# Patient Record
Sex: Female | Born: 1935 | Race: White | Hispanic: No | Smoking: Never smoker
Health system: Southern US, Community
[De-identification: ages and names within clinical notes are randomized; demographics above are authoritative.]

## PROBLEM LIST (undated history)

## (undated) DIAGNOSIS — R634 Abnormal weight loss: Secondary | ICD-10-CM

## (undated) DIAGNOSIS — E119 Type 2 diabetes mellitus without complications: Secondary | ICD-10-CM

## (undated) DIAGNOSIS — E785 Hyperlipidemia, unspecified: Secondary | ICD-10-CM

## (undated) DIAGNOSIS — N200 Calculus of kidney: Secondary | ICD-10-CM

## (undated) DIAGNOSIS — G4700 Insomnia, unspecified: Secondary | ICD-10-CM

## (undated) DIAGNOSIS — F419 Anxiety disorder, unspecified: Secondary | ICD-10-CM

## (undated) DIAGNOSIS — I10 Essential (primary) hypertension: Secondary | ICD-10-CM

## (undated) DIAGNOSIS — Z862 Personal history of diseases of the blood and blood-forming organs and certain disorders involving the immune mechanism: Secondary | ICD-10-CM

## (undated) DIAGNOSIS — K219 Gastro-esophageal reflux disease without esophagitis: Secondary | ICD-10-CM

## (undated) DIAGNOSIS — F329 Major depressive disorder, single episode, unspecified: Secondary | ICD-10-CM

## (undated) HISTORY — PX: DILATION AND CURETTAGE OF UTERUS: SHX78

## (undated) HISTORY — PX: HEMORRHOID SURGERY: SHX153

## (undated) HISTORY — DX: Major depressive disorder, single episode, unspecified: F32.9

## (undated) HISTORY — DX: Insomnia, unspecified: G47.00

## (undated) HISTORY — DX: Abnormal weight loss: R63.4

## (undated) HISTORY — PX: ABDOMINAL HYSTERECTOMY: SHX81

## (undated) HISTORY — DX: Hyperlipidemia, unspecified: E78.5

## (undated) HISTORY — DX: Essential (primary) hypertension: I10

## (undated) HISTORY — PX: APPENDECTOMY: SHX54

## (undated) HISTORY — DX: Gastro-esophageal reflux disease without esophagitis: K21.9

## (undated) HISTORY — PX: CATARACT EXTRACTION: SUR2

## (undated) HISTORY — DX: Type 2 diabetes mellitus without complications: E11.9

## (undated) HISTORY — DX: Calculus of kidney: N20.0

## (undated) HISTORY — DX: Personal history of diseases of the blood and blood-forming organs and certain disorders involving the immune mechanism: Z86.2

---

## 1999-08-13 ENCOUNTER — Encounter: Admission: RE | Admit: 1999-08-13 | Discharge: 1999-08-13 | Payer: Self-pay | Admitting: Internal Medicine

## 1999-08-13 ENCOUNTER — Encounter: Payer: Self-pay | Admitting: Internal Medicine

## 1999-08-21 ENCOUNTER — Encounter: Admission: RE | Admit: 1999-08-21 | Discharge: 1999-08-21 | Payer: Self-pay | Admitting: Internal Medicine

## 1999-08-21 ENCOUNTER — Encounter: Payer: Self-pay | Admitting: Internal Medicine

## 2004-08-14 ENCOUNTER — Ambulatory Visit: Payer: Self-pay | Admitting: Internal Medicine

## 2004-11-20 ENCOUNTER — Ambulatory Visit: Payer: Self-pay | Admitting: Internal Medicine

## 2005-02-19 ENCOUNTER — Ambulatory Visit: Payer: Self-pay | Admitting: Internal Medicine

## 2005-05-17 ENCOUNTER — Ambulatory Visit: Payer: Self-pay | Admitting: Internal Medicine

## 2005-09-11 ENCOUNTER — Ambulatory Visit: Payer: Self-pay | Admitting: Internal Medicine

## 2005-12-26 ENCOUNTER — Ambulatory Visit: Payer: Self-pay | Admitting: Internal Medicine

## 2006-04-02 ENCOUNTER — Ambulatory Visit: Payer: Self-pay | Admitting: Internal Medicine

## 2006-06-17 ENCOUNTER — Encounter: Payer: Self-pay | Admitting: Internal Medicine

## 2006-07-03 ENCOUNTER — Ambulatory Visit: Payer: Self-pay | Admitting: Internal Medicine

## 2006-10-09 ENCOUNTER — Ambulatory Visit: Payer: Self-pay | Admitting: Internal Medicine

## 2007-01-15 ENCOUNTER — Encounter (INDEPENDENT_AMBULATORY_CARE_PROVIDER_SITE_OTHER): Payer: Self-pay | Admitting: Internal Medicine

## 2007-01-29 ENCOUNTER — Ambulatory Visit: Payer: Self-pay | Admitting: Internal Medicine

## 2007-01-29 ENCOUNTER — Inpatient Hospital Stay (HOSPITAL_COMMUNITY): Admission: AD | Admit: 2007-01-29 | Discharge: 2007-01-30 | Payer: Self-pay | Admitting: Internal Medicine

## 2007-01-30 ENCOUNTER — Encounter: Payer: Self-pay | Admitting: Internal Medicine

## 2007-02-05 ENCOUNTER — Encounter: Payer: Self-pay | Admitting: Internal Medicine

## 2007-02-05 ENCOUNTER — Ambulatory Visit: Payer: Self-pay | Admitting: Internal Medicine

## 2007-02-12 ENCOUNTER — Ambulatory Visit: Payer: Self-pay

## 2007-02-12 ENCOUNTER — Encounter: Payer: Self-pay | Admitting: Internal Medicine

## 2007-05-18 DIAGNOSIS — I10 Essential (primary) hypertension: Secondary | ICD-10-CM

## 2007-05-18 DIAGNOSIS — E119 Type 2 diabetes mellitus without complications: Secondary | ICD-10-CM

## 2007-05-18 DIAGNOSIS — E1149 Type 2 diabetes mellitus with other diabetic neurological complication: Secondary | ICD-10-CM | POA: Insufficient documentation

## 2007-05-18 DIAGNOSIS — K219 Gastro-esophageal reflux disease without esophagitis: Secondary | ICD-10-CM

## 2007-05-18 HISTORY — DX: Gastro-esophageal reflux disease without esophagitis: K21.9

## 2007-05-18 HISTORY — DX: Essential (primary) hypertension: I10

## 2007-05-18 HISTORY — DX: Type 2 diabetes mellitus without complications: E11.9

## 2007-05-21 ENCOUNTER — Ambulatory Visit: Payer: Self-pay | Admitting: Internal Medicine

## 2007-05-21 LAB — CONVERTED CEMR LAB: Hgb A1c MFr Bld: 6.5 % — ABNORMAL HIGH (ref 4.6–6.0)

## 2007-06-25 ENCOUNTER — Encounter: Payer: Self-pay | Admitting: Internal Medicine

## 2007-09-10 ENCOUNTER — Ambulatory Visit: Payer: Self-pay | Admitting: Internal Medicine

## 2007-09-10 LAB — CONVERTED CEMR LAB: Hgb A1c MFr Bld: 6.3 % — ABNORMAL HIGH (ref 4.6–6.0)

## 2007-12-01 ENCOUNTER — Telehealth: Payer: Self-pay | Admitting: Internal Medicine

## 2007-12-17 ENCOUNTER — Ambulatory Visit: Payer: Self-pay | Admitting: Internal Medicine

## 2007-12-17 LAB — CONVERTED CEMR LAB: Hgb A1c MFr Bld: 6.2 % — ABNORMAL HIGH (ref 4.6–6.0)

## 2008-03-16 ENCOUNTER — Encounter: Payer: Self-pay | Admitting: Internal Medicine

## 2008-03-17 ENCOUNTER — Ambulatory Visit: Payer: Self-pay | Admitting: Internal Medicine

## 2008-03-17 DIAGNOSIS — E785 Hyperlipidemia, unspecified: Secondary | ICD-10-CM

## 2008-03-17 HISTORY — DX: Hyperlipidemia, unspecified: E78.5

## 2008-03-17 LAB — CONVERTED CEMR LAB
ALT: 14 units/L (ref 0–35)
Albumin: 3.7 g/dL (ref 3.5–5.2)
Basophils Relative: 1.1 % (ref 0.0–3.0)
CO2: 30 meq/L (ref 19–32)
Calcium: 9.7 mg/dL (ref 8.4–10.5)
Cholesterol: 216 mg/dL (ref 0–200)
Creatinine, Ser: 0.9 mg/dL (ref 0.4–1.2)
Direct LDL: 135.1 mg/dL
GFR calc Af Amer: 77 mL/min
Glucose, Bld: 126 mg/dL — ABNORMAL HIGH (ref 70–99)
HCT: 37.3 % (ref 36.0–46.0)
Hemoglobin: 12.6 g/dL (ref 12.0–15.0)
Hgb A1c MFr Bld: 6.4 % — ABNORMAL HIGH (ref 4.6–6.0)
Lymphocytes Relative: 20.7 % (ref 12.0–46.0)
MCHC: 33.8 g/dL (ref 30.0–36.0)
Monocytes Absolute: 0.4 10*3/uL (ref 0.1–1.0)
Monocytes Relative: 4.2 % (ref 3.0–12.0)
Neutro Abs: 6.3 10*3/uL (ref 1.4–7.7)
RBC: 4.12 M/uL (ref 3.87–5.11)
RDW: 12.5 % (ref 11.5–14.6)
TSH: 4.46 microintl units/mL (ref 0.35–5.50)
Total Protein: 7.4 g/dL (ref 6.0–8.3)
VLDL: 56 mg/dL — ABNORMAL HIGH (ref 0–40)

## 2008-07-07 ENCOUNTER — Encounter: Payer: Self-pay | Admitting: Internal Medicine

## 2008-07-21 ENCOUNTER — Ambulatory Visit: Payer: Self-pay | Admitting: Internal Medicine

## 2008-11-23 ENCOUNTER — Ambulatory Visit: Payer: Self-pay | Admitting: Internal Medicine

## 2008-11-23 DIAGNOSIS — G4700 Insomnia, unspecified: Secondary | ICD-10-CM

## 2008-11-23 DIAGNOSIS — R51 Headache: Secondary | ICD-10-CM

## 2008-11-23 DIAGNOSIS — R519 Headache, unspecified: Secondary | ICD-10-CM | POA: Insufficient documentation

## 2008-11-23 HISTORY — DX: Insomnia, unspecified: G47.00

## 2008-12-19 ENCOUNTER — Telehealth: Payer: Self-pay | Admitting: Internal Medicine

## 2009-02-23 ENCOUNTER — Ambulatory Visit: Payer: Self-pay | Admitting: Internal Medicine

## 2009-06-01 ENCOUNTER — Ambulatory Visit: Payer: Self-pay | Admitting: Internal Medicine

## 2009-06-01 LAB — CONVERTED CEMR LAB
ALT: 12 units/L (ref 0–35)
AST: 17 units/L (ref 0–37)
Basophils Relative: 0 % (ref 0.0–3.0)
Bilirubin, Direct: 0 mg/dL (ref 0.0–0.3)
CO2: 31 meq/L (ref 19–32)
Calcium: 9.5 mg/dL (ref 8.4–10.5)
Chloride: 106 meq/L (ref 96–112)
Creatinine,U: 177.9 mg/dL
Direct LDL: 129.4 mg/dL
Eosinophils Absolute: 0.1 10*3/uL (ref 0.0–0.7)
HCT: 36.1 % (ref 36.0–46.0)
Hemoglobin: 12.1 g/dL (ref 12.0–15.0)
Hgb A1c MFr Bld: 6.3 % (ref 4.6–6.5)
Lymphocytes Relative: 18.5 % (ref 12.0–46.0)
Lymphs Abs: 1.9 10*3/uL (ref 0.7–4.0)
MCHC: 33.6 g/dL (ref 30.0–36.0)
MCV: 87.7 fL (ref 78.0–100.0)
Microalb Creat Ratio: 13.5 mg/g (ref 0.0–30.0)
Neutro Abs: 7.7 10*3/uL (ref 1.4–7.7)
Potassium: 4.1 meq/L (ref 3.5–5.1)
RBC: 4.12 M/uL (ref 3.87–5.11)
Sodium: 146 meq/L — ABNORMAL HIGH (ref 135–145)
Total CHOL/HDL Ratio: 4

## 2009-08-17 ENCOUNTER — Encounter (INDEPENDENT_AMBULATORY_CARE_PROVIDER_SITE_OTHER): Payer: Self-pay | Admitting: *Deleted

## 2009-09-21 ENCOUNTER — Ambulatory Visit: Payer: Self-pay | Admitting: Internal Medicine

## 2009-09-25 ENCOUNTER — Telehealth (INDEPENDENT_AMBULATORY_CARE_PROVIDER_SITE_OTHER): Payer: Self-pay | Admitting: *Deleted

## 2009-10-13 ENCOUNTER — Ambulatory Visit: Payer: Self-pay | Admitting: Internal Medicine

## 2009-10-13 DIAGNOSIS — J069 Acute upper respiratory infection, unspecified: Secondary | ICD-10-CM | POA: Insufficient documentation

## 2009-11-16 ENCOUNTER — Encounter: Payer: Self-pay | Admitting: Internal Medicine

## 2009-12-13 ENCOUNTER — Telehealth: Payer: Self-pay | Admitting: Internal Medicine

## 2009-12-20 ENCOUNTER — Telehealth: Payer: Self-pay | Admitting: Internal Medicine

## 2009-12-21 ENCOUNTER — Ambulatory Visit: Payer: Self-pay | Admitting: Internal Medicine

## 2010-01-05 ENCOUNTER — Encounter: Payer: Self-pay | Admitting: Internal Medicine

## 2010-03-01 ENCOUNTER — Ambulatory Visit: Payer: Self-pay | Admitting: Internal Medicine

## 2010-03-01 DIAGNOSIS — Z862 Personal history of diseases of the blood and blood-forming organs and certain disorders involving the immune mechanism: Secondary | ICD-10-CM

## 2010-03-01 HISTORY — DX: Personal history of diseases of the blood and blood-forming organs and certain disorders involving the immune mechanism: Z86.2

## 2010-03-02 LAB — CONVERTED CEMR LAB
AST: 18 units/L (ref 0–37)
Albumin: 3.9 g/dL (ref 3.5–5.2)
Alkaline Phosphatase: 41 units/L (ref 39–117)
Basophils Absolute: 0 10*3/uL (ref 0.0–0.1)
Bilirubin, Direct: 0.1 mg/dL (ref 0.0–0.3)
CO2: 28 meq/L (ref 19–32)
Calcium: 9.5 mg/dL (ref 8.4–10.5)
Creatinine, Ser: 0.8 mg/dL (ref 0.4–1.2)
Eosinophils Absolute: 0.2 10*3/uL (ref 0.0–0.7)
GFR calc non Af Amer: 74.63 mL/min (ref >60)
Glucose, Bld: 97 mg/dL (ref 70–99)
Hemoglobin: 10.2 g/dL — ABNORMAL LOW (ref 12.0–15.0)
Lymphocytes Relative: 21.2 % (ref 12.0–46.0)
MCHC: 32.8 g/dL (ref 30.0–36.0)
Monocytes Relative: 5.1 % (ref 3.0–12.0)
Neutrophils Relative %: 71.5 % (ref 43.0–77.0)
Platelets: 306 10*3/uL (ref 150.0–400.0)
RDW: 13.9 % (ref 11.5–14.6)
Sodium: 138 meq/L (ref 135–145)
Total Bilirubin: 0.7 mg/dL (ref 0.3–1.2)

## 2010-03-06 ENCOUNTER — Ambulatory Visit: Payer: Self-pay | Admitting: Internal Medicine

## 2010-03-06 LAB — CONVERTED CEMR LAB
OCCULT 1: NEGATIVE
OCCULT 2: NEGATIVE
OCCULT 3: NEGATIVE

## 2010-03-08 ENCOUNTER — Telehealth: Payer: Self-pay | Admitting: Internal Medicine

## 2010-03-16 ENCOUNTER — Telehealth: Payer: Self-pay | Admitting: Internal Medicine

## 2010-03-29 ENCOUNTER — Ambulatory Visit: Payer: Self-pay | Admitting: Internal Medicine

## 2010-03-29 LAB — CONVERTED CEMR LAB
Folate: >20 ng/mL
Vitamin B-12: 539 pg/mL (ref 211–911)

## 2010-04-03 LAB — CONVERTED CEMR LAB
Basophils Relative: 0.2 % (ref 0.0–3.0)
Eosinophils Absolute: 0.1 10*3/uL (ref 0.0–0.7)
Eosinophils Relative: 1 % (ref 0.0–5.0)
Ferritin: 7.8 ng/mL — ABNORMAL LOW (ref 10.0–291.0)
Lymphocytes Relative: 20 % (ref 12.0–46.0)
MCV: 82.6 fL (ref 78.0–100.0)
Monocytes Absolute: 0.5 10*3/uL (ref 0.1–1.0)
Neutrophils Relative %: 74.1 % (ref 43.0–77.0)
Platelets: 303 10*3/uL (ref 150.0–400.0)
RBC: 3.89 M/uL (ref 3.87–5.11)
WBC: 9.9 10*3/uL (ref 4.5–10.5)

## 2010-04-05 ENCOUNTER — Ambulatory Visit: Payer: Self-pay | Admitting: Cardiovascular Disease

## 2010-04-06 ENCOUNTER — Telehealth: Payer: Self-pay | Admitting: Internal Medicine

## 2010-04-12 ENCOUNTER — Encounter (INDEPENDENT_AMBULATORY_CARE_PROVIDER_SITE_OTHER): Payer: Self-pay | Admitting: *Deleted

## 2010-04-19 ENCOUNTER — Ambulatory Visit: Payer: Self-pay | Admitting: Family Medicine

## 2010-04-19 LAB — CONVERTED CEMR LAB
Basophils Absolute: 0 10*3/uL (ref 0.0–0.1)
Eosinophils Absolute: 0 10*3/uL (ref 0.0–0.7)
Glucose, Urine, Semiquant: NEGATIVE
HCT: 32 % — ABNORMAL LOW (ref 36.0–46.0)
Lymphs Abs: 2.3 10*3/uL (ref 0.7–4.0)
MCV: 83.3 fL (ref 78.0–100.0)
Monocytes Absolute: 0.8 10*3/uL (ref 0.1–1.0)
Neutrophils Relative %: 80.6 % — ABNORMAL HIGH (ref 43.0–77.0)
Platelets: 284 10*3/uL (ref 150.0–400.0)
Protein, U semiquant: NEGATIVE
RDW: 17 % — ABNORMAL HIGH (ref 11.5–14.6)
Urobilinogen, UA: 0.2

## 2010-04-20 ENCOUNTER — Telehealth: Payer: Self-pay | Admitting: Internal Medicine

## 2010-04-23 ENCOUNTER — Telehealth: Payer: Self-pay | Admitting: Internal Medicine

## 2010-04-23 ENCOUNTER — Ambulatory Visit: Payer: Self-pay | Admitting: Internal Medicine

## 2010-04-23 DIAGNOSIS — R7989 Other specified abnormal findings of blood chemistry: Secondary | ICD-10-CM | POA: Insufficient documentation

## 2010-04-24 ENCOUNTER — Telehealth: Payer: Self-pay | Admitting: Internal Medicine

## 2010-05-09 ENCOUNTER — Ambulatory Visit: Payer: Self-pay | Admitting: Internal Medicine

## 2010-05-09 DIAGNOSIS — D509 Iron deficiency anemia, unspecified: Secondary | ICD-10-CM

## 2010-05-09 LAB — CONVERTED CEMR LAB
Eosinophils Relative: 1.6 % (ref 0.0–5.0)
Lymphocytes Relative: 22 % (ref 12.0–46.0)
MCV: 83.1 fL (ref 78.0–100.0)
Monocytes Absolute: 0.6 10*3/uL (ref 0.1–1.0)
Monocytes Relative: 6 % (ref 3.0–12.0)
Neutrophils Relative %: 70.1 % (ref 43.0–77.0)
Platelets: 279 10*3/uL (ref 150.0–400.0)
WBC: 10.8 10*3/uL — ABNORMAL HIGH (ref 4.5–10.5)

## 2010-05-30 ENCOUNTER — Ambulatory Visit: Payer: Self-pay | Admitting: Gastroenterology

## 2010-05-30 DIAGNOSIS — R634 Abnormal weight loss: Secondary | ICD-10-CM

## 2010-05-30 HISTORY — DX: Abnormal weight loss: R63.4

## 2010-06-05 ENCOUNTER — Ambulatory Visit: Payer: Self-pay | Admitting: Gastroenterology

## 2010-06-18 ENCOUNTER — Encounter (INDEPENDENT_AMBULATORY_CARE_PROVIDER_SITE_OTHER): Payer: Self-pay | Admitting: *Deleted

## 2010-06-20 ENCOUNTER — Ambulatory Visit: Payer: Self-pay | Admitting: Gastroenterology

## 2010-06-22 ENCOUNTER — Telehealth: Payer: Self-pay | Admitting: Gastroenterology

## 2010-06-28 ENCOUNTER — Ambulatory Visit: Payer: Self-pay | Admitting: Gastroenterology

## 2010-06-28 HISTORY — PX: COLONOSCOPY: SHX174

## 2010-06-29 ENCOUNTER — Telehealth: Payer: Self-pay | Admitting: Gastroenterology

## 2010-07-04 ENCOUNTER — Encounter: Payer: Self-pay | Admitting: Internal Medicine

## 2010-07-04 ENCOUNTER — Ambulatory Visit: Payer: Self-pay | Admitting: Gastroenterology

## 2010-07-04 LAB — CONVERTED CEMR LAB
BUN: 17 mg/dL (ref 6–23)
Creatinine, Ser: 0.8 mg/dL (ref 0.4–1.2)

## 2010-07-06 ENCOUNTER — Ambulatory Visit: Payer: Self-pay | Admitting: Gastroenterology

## 2010-07-06 LAB — CONVERTED CEMR LAB
OCCULT 1: NEGATIVE
OCCULT 3: NEGATIVE

## 2010-07-09 ENCOUNTER — Encounter: Payer: Self-pay | Admitting: Gastroenterology

## 2010-07-09 ENCOUNTER — Telehealth: Payer: Self-pay | Admitting: Gastroenterology

## 2010-07-11 ENCOUNTER — Ambulatory Visit: Payer: Self-pay | Admitting: Gastroenterology

## 2010-07-12 ENCOUNTER — Ambulatory Visit: Payer: Self-pay | Admitting: Gastroenterology

## 2010-07-12 LAB — CONVERTED CEMR LAB
CO2: 29 meq/L (ref 19–32)
Calcium: 9.1 mg/dL (ref 8.4–10.5)
Glucose, Bld: 100 mg/dL — ABNORMAL HIGH (ref 70–99)
Sodium: 137 meq/L (ref 135–145)

## 2010-07-19 ENCOUNTER — Telehealth: Payer: Self-pay | Admitting: Gastroenterology

## 2010-07-25 ENCOUNTER — Ambulatory Visit: Payer: Self-pay | Admitting: Cardiology

## 2010-07-30 ENCOUNTER — Telehealth: Payer: Self-pay | Admitting: Internal Medicine

## 2010-07-30 ENCOUNTER — Encounter: Payer: Self-pay | Admitting: Gastroenterology

## 2010-07-31 ENCOUNTER — Telehealth: Payer: Self-pay | Admitting: Gastroenterology

## 2010-08-16 ENCOUNTER — Ambulatory Visit: Payer: Self-pay | Admitting: Internal Medicine

## 2010-08-16 LAB — CONVERTED CEMR LAB
Eosinophils Relative: 1.3 % (ref 0.0–5.0)
Hgb A1c MFr Bld: 6.3 % (ref 4.6–6.5)
Lymphocytes Relative: 26.6 % (ref 12.0–46.0)
Monocytes Absolute: 0.5 10*3/uL (ref 0.1–1.0)
Monocytes Relative: 5.9 % (ref 3.0–12.0)
Neutrophils Relative %: 65.9 % (ref 43.0–77.0)
Platelets: 243 10*3/uL (ref 150.0–400.0)
WBC: 9 10*3/uL (ref 4.5–10.5)

## 2010-09-13 ENCOUNTER — Telehealth: Payer: Self-pay | Admitting: Internal Medicine

## 2010-09-19 ENCOUNTER — Ambulatory Visit
Admission: RE | Admit: 2010-09-19 | Discharge: 2010-09-19 | Payer: Self-pay | Source: Home / Self Care | Attending: Internal Medicine | Admitting: Internal Medicine

## 2010-09-19 DIAGNOSIS — F329 Major depressive disorder, single episode, unspecified: Secondary | ICD-10-CM | POA: Insufficient documentation

## 2010-09-19 DIAGNOSIS — F3289 Other specified depressive episodes: Secondary | ICD-10-CM

## 2010-09-19 HISTORY — DX: Major depressive disorder, single episode, unspecified: F32.9

## 2010-09-19 HISTORY — DX: Other specified depressive episodes: F32.89

## 2010-09-19 LAB — CONVERTED CEMR LAB: Blood Glucose, Fingerstick: 101

## 2010-09-22 ENCOUNTER — Encounter: Payer: Self-pay | Admitting: Gastroenterology

## 2010-10-02 NOTE — Letter (Signed)
Summary: EGD Instructions  [facility]  [address] [place], [place] [address]   Phone: [phone]  Fax: [phone]       MADELYN TLATELPA    [birth year]    MRN: [id number]       Procedure Day /Date:TUESDAY 06/05/2010     Arrival Time: 9AM     Procedure Time:10AM     Location of Procedure:                    X  [facility] (4th Floor)  PREPARATION FOR ENDOSCOPY   On10/12/2009 THE DAY OF THE PROCEDURE:  1.   No solid foods, milk or milk products are allowed after midnight the night before your procedure.  2.   Do not drink anything colored red or purple.  Avoid juices with pulp.  No orange juice.  3.  You may drink clear liquids until8AM which is 2 hours before your procedure.                                                                                                CLEAR LIQUIDS INCLUDE: Water Jello Ice Popsicles Tea (sugar ok, no milk/cream) Powdered fruit flavored drinks Coffee (sugar ok, no milk/cream) Gatorade Juice: apple, white grape, white cranberry  Lemonade Clear bullion, consomm, broth Carbonated beverages (any kind) Strained chicken noodle soup Hard Candy   MEDICATION INSTRUCTIONS  Unless otherwise instructed, you should take regular prescription medications with a small sip of water as early as possible the morning of your procedure.  Diabetic patients - see separate instructions.            OTHER INSTRUCTIONS  You will need a responsible adult at least 75 years of age to accompany you and drive you home.   This person must remain in the waiting room during your procedure.  Wear loose fitting clothing that is easily removed.  Leave jewelry and other valuables at home.  However, you may wish to bring a book to read or an iPod/MP3 player to listen to music as you wait for your procedure to start.  Remove all body piercing jewelry and leave at home.  Total time from sign-in until discharge is approximately 2-3  hours.  You should go home directly after your procedure and rest.  You can resume normal activities the day after your procedure.  The day of your procedure you should not:   Drive   Make legal decisions   Operate machinery   Drink alcohol   Return to work  You will receive specific instructions about eating, activities and medications before you leave.    The above instructions have been reviewed and explained to me by   _______________________    I fully understand and can verbalize these instructions _____________________________ Date _________

## 2010-10-02 NOTE — Progress Notes (Signed)
Summary: Schedule CT scan   Phone Note Outgoing Call Call back at [facility] Phone [phone]   Call placed by: Merri Ray CMA Duncan Dull),  June 29, 2010 8:50 AM Summary of Call: Called pt to inform that we needed to schedule a CT scan. Pt stated she has alot of appointments coming up, will have to call me back this afternoon to schedule CT Initial call taken by: Merri Ray CMA Duncan Dull),  June 29, 2010 8:51 AM  Follow-up for Phone Call        PT RETURNED MY CALL, GOT PT SCHEDULED AT [place] [facility] ON 07/11/2010 AT 1:30PM. PT TO COME INTO THE OFFICE ON 11/2 AFTER 1:30 FOR CT CONTRAST AND INSTRUCTIONS.ALSO SHE WILL NEED TO GO TO THE BASMENT FOR A BUN AND CREATINE THAT DAY. Follow-up by: Merri Ray CMA Duncan Dull),  June 29, 2010 11:09 AM     Appended Document: Schedule CT scan ORDERS COMPLETE  Appended Document: Schedule CT scan PT CAME IN TODAY FOR INSTRUCTIONS AND CONTRAST AND LABS

## 2010-10-02 NOTE — Assessment & Plan Note (Signed)
Summary: weakness/elevated BS/dm   Vital Signs:  Patient profile:   77 year old female Weight:      162 pounds Temp:     98.2 degrees F oral BP sitting:   130 / 52  (left arm) Cuff size:   regular  Vitals Entered By: Sid Falcon LPN (April 19, 2010 2:27 PM) CBG Result 127   History of Present Illness: Same-day appointment.  Patient seen with one week history of progressive lightheadedness and dizziness. No syncope. No vertigo. Also relates few days of feeling hot and cold alternatively. She's had some chills. Temperature somewhere over 100 this morning but she is not sure of exact degree. She denies any cough, urinary symptoms, sore throat, nausea, vomiting, diarrhea, skin rashes, abdominal pain, nasal congestion, or any chest pain.  Had some recent headaches. CT of the head unremarkable. Labs significant for low hemoglobin with low ferritin. Gastroenterology referral arranged. Denies bloody stools.  Headaches are bifrontal. No sinusitis symptoms. Some relief with tramadol and Advil. Has Type 2 diabetes which has been stable.  Allergies: 1)  ! Pcn 2)  Amoxicillin (Amoxicillin)  Past History:  Past Medical History: Last updated: 06/01/2009 Kidney Stones High Cholesterol Diabetes mellitus, type II GERD Hypertension Hyperlipidemia insomnia Headache bifascicular block  Past Surgical History: Last updated: 03/29/2010 Cataract extraction bilateral Hemorrhoidectomy Hysterectomy-complete with incidental appendectomy, age 60 negative adenosine Cardiolite study June 2008  colonoscopy (2)- last aprrox 2008 (best patient estimate)  Family History: Last updated: 03/17/2008 Family History Breast cancer 1st degree relative <50 Family History of Colon CA 1st degree relative <60 Family History Ovarian cancer Family History of Skin cancer Family History of Stroke M 1st degree relative <50 Family History of Cardiovascular disorder Family History of Neurological  disorder   father died age 20, brain cancer mother died age 8, CVA  3  brothers, history of MI, status post CABG 4 sisters-positive for ovarian, colon and breast cancer, as well as senile dementia  Social History: Last updated: 05/18/2007 Retired Single Never Smoked  Risk Factors: Smoking Status: never (05/18/2007)  Review of Systems      See HPI       The patient complains of fever.  The patient denies anorexia, weight loss, chest pain, syncope, dyspnea on exertion, peripheral edema, prolonged cough, hemoptysis, abdominal pain, melena, hematochezia, suspicious skin lesions, and enlarged lymph nodes.    Physical Exam  General:  Well-developed,well-nourished,in no acute distress; alert,appropriate and cooperative throughout examination Eyes:  conjunctivae were somewhat pale otherwise no acute findings Ears:  External ear exam shows no significant lesions or deformities.  Otoscopic examination reveals clear canals, tympanic membranes are intact bilaterally without bulging, retraction, inflammation or discharge. Hearing is grossly normal bilaterally. Mouth:  Oral mucosa and oropharynx without lesions or exudates.  Teeth in good repair. Neck:  No deformities, masses, or tenderness noted. Lungs:  Normal respiratory effort, chest expands symmetrically. Lungs are clear to auscultation, no crackles or wheezes. Heart:  normal rate and regular rhythm.   Abdomen:  soft, non-tender, normal bowel sounds, no distention, no hepatomegaly, and no splenomegaly.   Extremities:  no edema Neurologic:  alert & oriented X3, cranial nerves II-XII intact, and gait normal.   Skin:  no rashes.   Cervical Nodes:  No lymphadenopathy noted   Impression & Recommendations:  Problem # 1:  WEAKNESS (ICD-780.79)  ?etiology.  Pt reports fever but none here today.  ?infection though no obvious symptoms.  ?related to anemia. Recent TSH and B12 normal.  Urine dip  unremarkable.  Orders: Venipuncture  (04540) Specimen Handling (98119)  Problem # 2:  ANEMIA, DEFICIENCY, HX OF (ICD-V12.3)  GI referral arranged.  Repeat CBC.  Orders: TLB-CBC Platelet - w/Differential (85025-CBCD) Venipuncture (14782) Specimen Handling (95621)  Problem # 3:  HEADACHE (ICD-784.0)  Her updated medication list for this problem includes:    Bayer Childrens Aspirin 81 Mg Chew (Aspirin) .Marland Kitchen... Take 1 tablet by mouth once a day    Hydrocodone-acetaminophen 5-500 Mg Tabs (Hydrocodone-acetaminophen) ..... One every 8 hours for pain    Advil 200 Mg Tabs (Ibuprofen) .Marland Kitchen... 2 every 6 hrs prn  Problem # 4:  DIABETES MELLITUS (ICD-250.00)  Her updated medication list for this problem includes:    Bayer Childrens Aspirin 81 Mg Chew (Aspirin) .Marland Kitchen... Take 1 tablet by mouth once a day    Metformin Hcl 500 Mg Tb24 (Metformin hcl) .Marland Kitchen... 1 once daily  Orders: Capillary Blood Glucose/CBG (30865)  Complete Medication List: 1)  Bayer Childrens Aspirin 81 Mg Chew (Aspirin) .... Take 1 tablet by mouth once a day 2)  Hydrochlorothiazide 25 Mg Tabs (Hydrochlorothiazide) .Marland Kitchen.. 1 once daily 3)  Metformin Hcl 500 Mg Tb24 (Metformin hcl) .Marland Kitchen.. 1 once daily 4)  Simvastatin 80 Mg Tabs (Simvastatin) .... 1/2 once daily 5)  Xalatan 0.005 % Soln (Latanoprost) .... As dir 6)  Omeprazole 20 Mg Cpdr (Omeprazole) .... One twice daily 7)  Flonase 50 Mcg/act Susp (Fluticasone propionate) .... Use daily 8)  Allegra-d 12 Hour 60-120 Mg Xr12h-tab (Fexofenadine-pseudoephedrine) .Marland Kitchen.. 1 two times a day 9)  Hydrocodone-acetaminophen 5-500 Mg Tabs (Hydrocodone-acetaminophen) .... One every 8 hours for pain 10)  Advil 200 Mg Tabs (Ibuprofen) .... 2 every 6 hrs prn  Patient Instructions: 1)  Be sure to keep follow up appt with gastroenterologist.  Laboratory Results   Urine Tests    Routine Urinalysis   Color: lt. yellow Appearance: Clear Glucose: negative   (Normal Range: Negative) Bilirubin: negative   (Normal Range: Negative) Ketone:  negative   (Normal Range: Negative) Spec. Gravity: 1.010   (Normal Range: 1.003-1.035) Blood: negative   (Normal Range: Negative) pH: 6.0   (Normal Range: 5.0-8.0) Protein: negative   (Normal Range: Negative) Urobilinogen: 0.2   (Normal Range: 0-1) Nitrite: negative   (Normal Range: Negative) Leukocyte Esterace: trace   (Normal Range: Negative)    Comments: Sid Falcon LPN  April 19, 2010 3:12 PM   Blood Tests     CBG Random:: 127mg /dL

## 2010-10-02 NOTE — Assessment & Plan Note (Signed)
Summary: 3 month follow up/cjr   Vital Signs:  Patient profile:   77 year old female Weight:      162 pounds Temp:     98.5 degrees F oral BP sitting:   142 / 80  (left arm) Cuff size:   regular  Vitals Entered By: Duard Brady LPN (December 21, 2009 12:48 PM) CC: 3 mos rov - doing well Is Patient Diabetic? No   CC:  3 mos rov - doing well.  History of Present Illness: 77 year old patient has a history of type 2 diabetes, hypertension, and dyslipidemia.  She is doing quite well.  She was seen two months ago for an acute visit, and hemoglobin A1c was done at that time still says it is allergic rhinitis.  She does have occasional headaches, but these seem improved.  In general, she is doing quite well.  Today.  She continues to have nice glycemic control.  Allergies: 1)  ! Pcn 2)  Amoxicillin (Amoxicillin)  Past History:  Past Medical History: Reviewed history from 06/01/2009 and no changes required. Kidney Stones High Cholesterol Diabetes mellitus, type II GERD Hypertension Hyperlipidemia insomnia Headache bifascicular block  Review of Systems       The patient complains of headaches.  The patient denies anorexia, fever, weight loss, weight gain, vision loss, decreased hearing, hoarseness, chest pain, syncope, dyspnea on exertion, peripheral edema, prolonged cough, hemoptysis, abdominal pain, melena, hematochezia, severe indigestion/heartburn, hematuria, incontinence, genital sores, muscle weakness, suspicious skin lesions, transient blindness, difficulty walking, depression, unusual weight change, abnormal bleeding, enlarged lymph nodes, angioedema, and breast masses.    Physical Exam  General:  overweight-appearing.  normal blood pressureoverweight-appearing.   Head:  Normocephalic and atraumatic without obvious abnormalities. No apparent alopecia or balding. Eyes:  No corneal or conjunctival inflammation noted. EOMI. Perrla. Funduscopic exam benign, without  hemorrhages, exudates or papilledema. Vision grossly normal. Ears:  External ear exam shows no significant lesions or deformities.  Otoscopic examination reveals clear canals, tympanic membranes are intact bilaterally without bulging, retraction, inflammation or discharge. Hearing is grossly normal bilaterally. Mouth:  Oral mucosa and oropharynx without lesions or exudates.  Teeth in good repair. Neck:  No deformities, masses, or tenderness noted. Lungs:  Normal respiratory effort, chest expands symmetrically. Lungs are clear to auscultation, no crackles or wheezes. Heart:  Normal rate and regular rhythm. S1 and S2 normal without gallop, murmur, click, rub or other extra sounds. Abdomen:  Bowel sounds positive,abdomen soft and non-tender without masses, organomegaly or hernias noted. Msk:  No deformity or scoliosis noted of thoracic or lumbar spine.   Pulses:  R and L carotid,radial,femoral,dorsalis pedis and posterior tibial pulses are full and equal bilaterally Extremities:  No clubbing, cyanosis, edema, or deformity noted with normal full range of motion of all joints.     Impression & Recommendations:  Problem # 1:  HEADACHE (ICD-784.0)  Her updated medication list for this problem includes:    Bayer Childrens Aspirin 81 Mg Chew (Aspirin) .Marland Kitchen... Take 1 tablet by mouth once a day    Tramadol Hcl 50 Mg Tabs (Tramadol hcl) ..... One every 6 hours as needed for pain  Her updated medication list for this problem includes:    Bayer Childrens Aspirin 81 Mg Chew (Aspirin) .Marland Kitchen... Take 1 tablet by mouth once a day    Tramadol Hcl 50 Mg Tabs (Tramadol hcl) ..... One every 6 hours as needed for pain  Problem # 2:  HYPERLIPIDEMIA (ICD-272.4)  Her updated medication list for this problem  includes:    Simvastatin 80 Mg Tabs (Simvastatin) .Marland Kitchen... 1/2 once daily  Her updated medication list for this problem includes:    Simvastatin 80 Mg Tabs (Simvastatin) .Marland Kitchen... 1/2 once daily  Problem # 3:   HYPERTENSION (ICD-401.9)  Her updated medication list for this problem includes:    Hydrochlorothiazide 25 Mg Tabs (Hydrochlorothiazide) .Marland Kitchen... 1 once daily  Her updated medication list for this problem includes:    Hydrochlorothiazide 25 Mg Tabs (Hydrochlorothiazide) .Marland Kitchen... 1 once daily  Problem # 4:  DIABETES MELLITUS, TYPE II (ICD-250.00)  Her updated medication list for this problem includes:    Bayer Childrens Aspirin 81 Mg Chew (Aspirin) .Marland Kitchen... Take 1 tablet by mouth once a day    Metformin Hcl 500 Mg Tb24 (Metformin hcl) .Marland Kitchen... 1 once daily Will check hemoglobin A1c next visit    Her updated medication list for this problem includes:    Bayer Childrens Aspirin 81 Mg Chew (Aspirin) .Marland Kitchen... Take 1 tablet by mouth once a day    Metformin Hcl 500 Mg Tb24 (Metformin hcl) .Marland Kitchen... 1 once daily  Orders: Prescription Created Electronically [phone])  Complete Medication List: 1)  Bayer Childrens Aspirin 81 Mg Chew (Aspirin) .... Take 1 tablet by mouth once a day 2)  Hydrochlorothiazide 25 Mg Tabs (Hydrochlorothiazide) .Marland Kitchen.. 1 once daily 3)  Metformin Hcl 500 Mg Tb24 (Metformin hcl) .Marland Kitchen.. 1 once daily 4)  Simvastatin 80 Mg Tabs (Simvastatin) .... 1/2 once daily 5)  Xalatan 0.005 % Soln (Latanoprost) .... As dir 6)  Omeprazole 20 Mg Cpdr (Omeprazole) .... One twice daily 7)  Flonase 50 Mcg/act Susp (Fluticasone propionate) .... Use daily 8)  Allegra-d 12 Hour 60-120 Mg Xr12h-tab (Fexofenadine-pseudoephedrine) .Marland Kitchen.. 1 two times a day 9)  Tramadol Hcl 50 Mg Tabs (Tramadol hcl) .... One every 6 hours as needed for pain  Patient Instructions: 1)  Please schedule a follow-up appointment in 4 months. 2)  Limit your Sodium (Salt). 3)  It is important that you exercise regularly at least 20 minutes 5 times a week. If you develop chest pain, have severe difficulty breathing, or feel very tired , stop exercising immediately and seek medical attention. 4)  You need to lose weight. Consider a lower calorie  diet and regular exercise.  5)  Take calcium +Vitamin D daily. 6)  Check your blood sugars regularly. If your readings are usually above : or below 70 you should contact our office. 7)  It is important that your Diabetic A1c level is checked every 3 months. 8)  See your eye doctor yearly to check for diabetic eye damage. Prescriptions: TRAMADOL HCL 50 MG TABS (TRAMADOL HCL) one every 6 hours as needed for pain  #90 x 4   Entered and Authorized by:   Gordy Savers  MD   Signed by:   Gordy Savers  MD on 12/21/2009   Method used:   Electronically to        [facility] [address] (retail)       [phone]       [place], [place]  [address]       Ph: [phone]       Fax: [phone]   RxID:   [id number] ALLEGRA-D 12 HOUR 60-120 MG XR12H-TAB (FEXOFENADINE-PSEUDOEPHEDRINE) 1 two times a day  #60 x 6   Entered and Authorized by:   Gordy Savers  MD   Signed by:   Gordy Savers  MD on 12/21/2009   Method used:   Electronically to        [facility] [address] [employer] (retail)       [phone]       [place], [place]  [address]       Ph: [phone]       Fax: [phone]   RxID:   [id number] FLONASE 50 MCG/ACT SUSP (FLUTICASONE PROPIONATE) use daily  #1 x 6   Entered and Authorized by:   Gordy Savers  MD   Signed by:   Gordy Savers  MD on 12/21/2009   Method used:   Electronically to        [facility] [address] (retail)       [phone]       [place], [place]  [address]       Ph: [phone]       Fax: [phone]   RxID:   [id number] OMEPRAZOLE 20 MG  CPDR (OMEPRAZOLE) one twice daily  #180 x 6   Entered and Authorized by:   Gordy Savers  MD   Signed by:   Gordy Savers  MD on 12/21/2009   Method used:   Electronically to        [facility] [address] (retail)       [phone]        [place], [place]  [address]       Ph: [phone]       Fax: [phone]   RxID:   [id number] SIMVASTATIN 80 MG TABS (SIMVASTATIN) 1/2 once daily  #90 x 6   Entered and Authorized by:   Gordy Savers  MD   Signed by:   Gordy Savers  MD on 12/21/2009   Method used:   Electronically to        [facility] [address] (retail)       [phone]       [place], [place]  [address]       Ph: [phone]       Fax: [phone]   RxID:   [id number] METFORMIN HCL 500 MG TB24 (METFORMIN HCL) 1 once daily  #90 x 6   Entered and Authorized by:   Gordy Savers  MD   Signed by:   Gordy Savers  MD on 12/21/2009   Method used:   Electronically to        [facility] [address] (retail)       [phone]       [place], [place]  [address]       Ph: [phone]       Fax: [phone]   RxID:   [id number] HYDROCHLOROTHIAZIDE 25 MG TABS (HYDROCHLOROTHIAZIDE) 1 once daily  #90 x 6   Entered and Authorized by:   Gordy Savers  MD   Signed by:   Gordy Savers  MD on 12/21/2009   Method used:   Electronically to        [facility] [address] (retail)       [phone]       [place], [place]  [address]  Ph: [phone]       Fax: [phone]   RxID:   [id number]

## 2010-10-02 NOTE — Miscellaneous (Signed)
Summary: previsit for colon/previsit  Clinical Lists Changes  Medications: Added new medication of MOVIPREP 100 GM  SOLR (PEG-KCL-NACL-NASULF-NA ASC-C) As directed - Signed Rx of MOVIPREP 100 GM  SOLR (PEG-KCL-NACL-NASULF-NA ASC-C) As directed;  #1 x 0;  Signed;  Entered by: Clide Cliff RN;  Authorized by: Louis Meckel MD;  Method used: Electronically to [facility] [phone]*, [address] [place], [place]  [address], Ph: [phone] or [phone], Fax: [phone] Allergies: Changed allergy or adverse reaction from PCN to PCN    Prescriptions: MOVIPREP 100 GM  SOLR (PEG-KCL-NACL-NASULF-NA ASC-C) As directed  #1 x 0   Entered by:   Clide Cliff RN   Authorized by:   Louis Meckel MD   Signed by:   Clide Cliff RN on 06/20/2010   Method used:   [facility]ronically to        [facility] [phone]* (retail)       [address]       [place], [place]  [address]       Ph: [phone] or [phone]       Fax: [phone]   RxID:   [id number]

## 2010-10-02 NOTE — Letter (Signed)
Summary: Diabetic Instructions  [facility]  [address] [place], [place] [address]   Phone: [phone]  Fax: [phone]    [name] 08-14-1923 MRN: [id number]   _  _   ORAL DIABETIC MEDICATION INSTRUCTIONS  The day before your procedure:   Take your diabetic pill as you do normally  The day of your procedure:   Do not take your diabetic pill    We will check your blood sugar levels during the admission process and again in Recovery before discharging you home  ________________________________________________________________________  _  _   INSULIN (LONG ACTING) MEDICATION INSTRUCTIONS (Lantus, NPH, 70/30, Humulin, Novolin-N)   The day before your procedure:   Take  your regular evening dose    The day of your procedure:   Do not take your morning dose    _  _   INSULIN (SHORT ACTING) MEDICATION INSTRUCTIONS (Regular, Humulog, Novolog)   The day before your procedure:   Do not take your evening dose   The day of your procedure:   Do not take your morning dose   _  _   INSULIN PUMP MEDICATION INSTRUCTIONS  We will contact the physician managing your diabetic care for written dosage instructions for the day before your procedure and the day of your procedure.  Once we have received the instructions, we will contact you.

## 2010-10-02 NOTE — Assessment & Plan Note (Signed)
Summary: LABS/AS PER DR/RCD   Vitals Entered By: Duard Brady LPN (April 23, 2010 11:30 AM)  History of Present Illness: [name] who is seen today for follow up.  For the past 3 days.  She has empirically been placed on prednisone therapy for possible polymyalgia rheumatica.  There is been no definite change in her status.  She still complains of some weakness, dizziness, and general sense of unwellness.  Is also been a recent history of headaches.  She denies any present headaches or visual disturbances.  The past several hours, she has had some very mild nonspecific substernal burning chest painarea and an EKG was reviewed today that was unchanged with a right bundle-branch block, but no acute findings.  She did have an occasional PVC.  Allergies: 1)  ! Pcn 2)  Amoxicillin (Amoxicillin)  Past History:  Past Medical History: Reviewed history from 06/01/2009 and no changes required. Kidney Stones High Cholesterol Diabetes mellitus, type II GERD Hypertension Hyperlipidemia insomnia Headache bifascicular block  Review of Systems       The [name] complains of anorexia and muscle weakness.  The [name] denies fever, weight loss, weight gain, vision loss, decreased hearing, hoarseness, chest pain, syncope, dyspnea on exertion, peripheral edema, prolonged cough, headaches, hemoptysis, abdominal pain, melena, hematochezia, severe indigestion/heartburn, hematuria, incontinence, genital sores, suspicious skin lesions, transient blindness, difficulty walking, depression, unusual weight change, abnormal bleeding, enlarged lymph nodes, angioedema, and breast masses.    Physical Exam  General:  overweight-appearing.  no distress.  Normal blood pressure Head:  Normocephalic and atraumatic without obvious abnormalities. No apparent alopecia or balding. temporal arteries normal Mouth:  Oral mucosa and oropharynx without lesions or exudates.  Teeth in good repair. Neck:  No  deformities, masses, or tenderness noted. Chest Wall:  no chest wall tenderness Lungs:  Normal respiratory effort, chest expands symmetrically. Lungs are clear to auscultation, no crackles or wheezes. Heart:  Normal rate and regular rhythm. S1 and S2 normal without gallop, murmur, click, rub or other extra sounds. Abdomen:  no epigastric   Impression & Recommendations:  Problem # 1:  WEAKNESS (ICD-780.79) will check a sedimentation rate  Problem # 2:  DIABETES MELLITUS (ICD-250.00)  Her updated medication list for this problem includes:    Bayer Childrens Aspirin 81 Mg Chew (Aspirin) .Marland Kitchen... Take 1 tablet by mouth once a day    Metformin Hcl 500 Mg Tb24 (Metformin hcl) .Marland Kitchen... 1 once daily  Complete Medication List: 1)  Bayer Childrens Aspirin 81 Mg Chew (Aspirin) .... Take 1 tablet by mouth once a day 2)  Hydrochlorothiazide 25 Mg Tabs (Hydrochlorothiazide) .Marland Kitchen.. 1 once daily 3)  Metformin Hcl 500 Mg Tb24 (Metformin hcl) .Marland Kitchen.. 1 once daily 4)  Simvastatin 80 Mg Tabs (Simvastatin) .... 1/2 once daily 5)  Xalatan 0.005 % Soln (Latanoprost) .... As dir 6)  Omeprazole 20 Mg Cpdr (Omeprazole) .... One twice daily 7)  Flonase 50 Mcg/act Susp (Fluticasone propionate) .... Use daily 8)  Allegra-d 12 Hour 60-120 Mg Xr12h-tab (Fexofenadine-pseudoephedrine) .Marland Kitchen.. 1 two times a day 9)  Hydrocodone-acetaminophen 5-500 Mg Tabs (Hydrocodone-acetaminophen) .... One every 8 hours for pain 10)  Advil 200 Mg Tabs (Ibuprofen) .... 2 every 6 hrs prn 11)  Prednisone 20 Mg Tabs (Prednisone) .... 2 by mouth qam  Other Orders: EKG w/ Interpretation (93000)  [name] Instructions: 1)  Please schedule a follow-up appointment in 2 weeks. 2)  It is important that you exercise regularly at least 20 minutes 5 times a week.  If you develop chest pain, have severe difficulty breathing, or feel very tired , stop exercising immediately and seek medical attention. 3)  call if  you develops fever, or any worsening of your  symptoms  Laboratory Results   Blood Tests     SED rate: 10 mm/hr  Comments: Rita Ohara  April 23, 2010 12:03 PM     Appended Document: LABS/AS PER DR/RCD Problem #3:  chest pain Plan-  EKG was reviewed, and this revealed no acute changes.  Presently, she is pain free.  Will continue to monitor and continue omeprazole on a b.i.d. regimen.  She will call if pain recurs.

## 2010-10-02 NOTE — Progress Notes (Signed)
Summary: simvastatin  Phone Note From Pharmacy   Caller: [facility] [address] [phone] Summary of Call: On Simvastatin 80 mg Rx of 06-01-09  1/2 q day.  80 crossed out & wrote in 40.  Verify 80 mg 1/2 daily or 40 mg 1/2 daily.   Initial call taken by: Rudy Jew, RN,  December 13, 2009 3:19 PM    80 mg   1/2 daily is proper dose Pharmacy notified. Rudy Jew, RN  December 14, 2009 8:14 AM

## 2010-10-02 NOTE — Assessment & Plan Note (Signed)
Summary: 3 WK ROV/NJR   Vital Signs:  Patient profile:   77 year old female Weight:      161 pounds Temp:     98.7 degrees F oral BP sitting:   130 / 70  (left arm) Cuff size:   regular  Vitals Entered By: Duard Brady LPN (March 29, 2010 12:42 PM) CC: 3 wk rov - doing fair - still with occasional lightheaded felling Is Patient Diabetic? Yes Did you bring your meter with you today? No CBG Result 118   CC:  3 wk rov - doing fair - still with occasional lightheaded felling.  History of Present Illness: 77 year old patient who is in today for follow-up.  She tends to have headaches and some weakness and dizziness.  She was recently  to have some anemia; stool for occult blood has been negative.  She has been on iron supplementation.  She states these headaches are new.  She denies any focal neurological symptoms.  She does have treated hypertension, which has been much to control the type 2 diabetes.  Allergies: 1)  ! Pcn 2)  Amoxicillin (Amoxicillin)  Past History:  Past Medical History: Reviewed history from 06/01/2009 and no changes required. Kidney Stones High Cholesterol Diabetes mellitus, type II GERD Hypertension Hyperlipidemia insomnia Headache bifascicular block  Past Surgical History: Cataract extraction bilateral Hemorrhoidectomy Hysterectomy-complete with incidental appendectomy, age 65 negative adenosine Cardiolite study June 2008  colonoscopy (2)- last aprrox 2008 (best patient estimate)  Review of Systems       The patient complains of headaches.  The patient denies anorexia, fever, weight loss, weight gain, vision loss, decreased hearing, hoarseness, chest pain, syncope, dyspnea on exertion, peripheral edema, prolonged cough, hemoptysis, abdominal pain, melena, hematochezia, severe indigestion/heartburn, hematuria, incontinence, genital sores, muscle weakness, suspicious skin lesions, transient blindness, difficulty walking, depression, unusual  weight change, abnormal bleeding, enlarged lymph nodes, angioedema, and breast masses.    Physical Exam  General:  overweight-appearing.  normal blood pressure, no distressoverweight-appearing.   Head:  Normocephalic and atraumatic without obvious abnormalities. No apparent alopecia or balding. Eyes:  No corneal or conjunctival inflammation noted. EOMI. Perrla. Funduscopic exam benign, without hemorrhages, exudates or papilledema. Vision grossly normal. Mouth:  Oral mucosa and oropharynx without lesions or exudates.  Teeth in good repair. Neck:  No deformities, masses, or tenderness noted. Chest Wall:  No deformities, masses, or tenderness noted. Lungs:  Normal respiratory effort, chest expands symmetrically. Lungs are clear to auscultation, no crackles or wheezes. Heart:  Normal rate and regular rhythm. S1 and S2 normal without gallop, murmur, click, rub or other extra sounds. Abdomen:  Bowel sounds positive,abdomen soft and non-tender without masses, organomegaly or hernias noted.   Impression & Recommendations:  Problem # 1:  ANEMIA, DEFICIENCY, HX OF (ICD-V12.3)  Orders: Venipuncture (19147) TLB-CBC Platelet - w/Differential (85025-CBCD) TLB-B12 + Folate Pnl (82956_21308-M57/QIO) TLB-Ferritin (82728-FER) Specimen Handling (96295)  Problem # 2:  HEADACHE (ICD-784.0)  The following medications were removed from the medication list:    Tramadol Hcl 50 Mg Tabs (Tramadol hcl) ..... One every 6 hours as needed for pain Her updated medication list for this problem includes:    Bayer Childrens Aspirin 81 Mg Chew (Aspirin) .Marland Kitchen... Take 1 tablet by mouth once a day    Hydrocodone-acetaminophen 5-500 Mg Tabs (Hydrocodone-acetaminophen) ..... One every 8 hours for pain  The following medications were removed from the medication list:    Tramadol Hcl 50 Mg Tabs (Tramadol hcl) ..... One every 6 hours as needed for  pain Her updated medication list for this problem includes:    Bayer Childrens  Aspirin 81 Mg Chew (Aspirin) .Marland Kitchen... Take 1 tablet by mouth once a day    Hydrocodone-acetaminophen 5-500 Mg Tabs (Hydrocodone-acetaminophen) ..... One every 8 hours for pain  Orders: Radiology Referral (Radiology)  Complete Medication List: 1)  Bayer Childrens Aspirin 81 Mg Chew (Aspirin) .... Take 1 tablet by mouth once a day 2)  Hydrochlorothiazide 25 Mg Tabs (Hydrochlorothiazide) .Marland Kitchen.. 1 once daily 3)  Metformin Hcl 500 Mg Tb24 (Metformin hcl) .Marland Kitchen.. 1 once daily 4)  Simvastatin 80 Mg Tabs (Simvastatin) .... 1/2 once daily 5)  Xalatan 0.005 % Soln (Latanoprost) .... As dir 6)  Omeprazole 20 Mg Cpdr (Omeprazole) .... One twice daily 7)  Flonase 50 Mcg/act Susp (Fluticasone propionate) .... Use daily 8)  Allegra-d 12 Hour 60-120 Mg Xr12h-tab (Fexofenadine-pseudoephedrine) .Marland Kitchen.. 1 two times a day 9)  Hydrocodone-acetaminophen 5-500 Mg Tabs (Hydrocodone-acetaminophen) .... One every 8 hours for pain  Other Orders: Capillary Blood Glucose/CBG ([address])  Patient Instructions: 1)  Please schedule a follow-up appointment in 3 months. 2)  Limit your Sodium (Salt) to less than 2 grams a day(slightly less than 1/2 a teaspoon) to prevent fluid retention, swelling, or worsening of symptoms. 3)  It is important that you exercise regularly at least 20 minutes 5 times a week. If you develop chest pain, have severe difficulty breathing, or feel very tired , stop exercising immediately and seek medical attention. Prescriptions: HYDROCODONE-ACETAMINOPHEN 5-500 MG TABS (HYDROCODONE-ACETAMINOPHEN) one every 8 hours for pain  #50 x 4   Entered and Authorized by:   Gordy Savers  MD   Signed by:   Gordy Savers  MD on 03/29/2010   Method used:   Print then Give to Patient   RxID:   [id number]

## 2010-10-02 NOTE — Progress Notes (Signed)
Summary: Pt req lab results. Pls call asap.  Phone Note Call from Patient Call back at [facility] Phone [phone]   Caller: Patient Summary of Call: Pt called req lab results. Pls call asap.   Initial call taken by: Lucy Antigua,  April 24, 2010 2:47 PM  Follow-up for Phone Call        please notify  patient that her lab results were normal and may discontinue the prednisone Follow-up by: Gordy Savers  MD,  April 24, 2010 3:38 PM  Additional Follow-up for Phone Call Additional follow up Details #1::        pt aware - lab normal - ok to stop prednisone. KIK Additional Follow-up by: Duard Brady LPN,  April 24, 2010 3:53 PM

## 2010-10-02 NOTE — Medication Information (Signed)
Summary: Order for Diabetic Testing Supplies  Order for Diabetic Testing Supplies   Imported By: Maryln Gottron 01/09/2010 10:44:18  _____________________________________________________________________  External Attachment:    Type:   Image     Comment:   External Document

## 2010-10-02 NOTE — Letter (Signed)
Summary: New Patient letter  [facility]  [address] [place], [place] [address]   Phone: [phone]  Fax: [phone]       04/12/2010 MRN: [id number]  [name] [address] [place], [place]  [address]  Dear Ms. Magar,  Welcome to the Gastroenterology Division at [facility].    You are scheduled to see Dr.  Christella Hartigan on 05-30-10 at 3:45p.m. on the 3rd floor at [facility], [address]  We ask that you try to arrive at our   office 15 minutes prior to your appointment time to allow for check-in.  We would like you to complete the enclosed self-administered evaluation form prior to your visit and bring it with you on the day of your appointment.  We will review it with you.  Also, please bring a complete list of all your medications or, if you prefer, bring the medication bottles and we will list them.  Please bring your insurance card so that we may make a copy of it.  If your insurance requires a referral to see a specialist, please bring your referral form from your primary care physician.  Co-payments are due at the time of your visit and may be paid by cash, check or credit card.     Your office visit will consist of a consult with your physician (includes a physical exam), any laboratory testing he/she may order, scheduling of any necessary diagnostic testing (e.g. x-ray, ultrasound, CT-scan), and scheduling of a procedure (e.g. Endoscopy, Colonoscopy) if required.  Please allow enough time on your schedule to allow for any/all of these possibilities.    If you cannot keep your appointment, please call [phone] to cancel or reschedule prior to your appointment date.  This allows [place] the opportunity to schedule an appointment for another patient in need of care.  If you do not cancel or reschedule by 5 p.m. the business day prior to your appointment date, you will be charged a $50.00 late cancellation/no-show fee.    Thank you for choosing   Gastroenterology for your medical needs.  We appreciate the opportunity to care for you.  Please visit [place] at our website  to learn more about our practice.                     Sincerely,                                                             The Gastroenterology Division

## 2010-10-02 NOTE — Progress Notes (Signed)
Summary: CHEST XRAY NEEDED FOR INSURANCE   Phone Note Call from Patient   Summary of Call: Pt called the office stating that insurance will not cover abdomen/pelvis without a chest x-ray and thyroid test first. PER DR KAPLAN schedule pt for labs and a chest xray for weight loss. Scheduled for Wed 07/11/2010 at 1:30pm pt aware. Initial call taken by: Merri Ray CMA Duncan Dull),  July 09, 2010 8:49 AM     Appended Document: Orders Update    Clinical Lists Changes  Orders: Added new Service order of Chest x-ray, 2 views (71020) - Signed      Appended Document: Orders Update    Clinical Lists Changes  Orders: Added new Test order of T-Chest x-ray, 2 views (52841) - Signed

## 2010-10-02 NOTE — Progress Notes (Signed)
Summary: REFILL REQUEST  Phone Note Refill Request Message from:  Patient on March 16, 2010 12:04 PM  Refills Requested: Medication #1:  TRAMADOL HCL 50 MG TABS one every 6 hours as needed for pain.   Notes: [facility].    Initial call taken by: Debbra Riding,  March 16, 2010 12:05 PM  Follow-up for Phone Call        #100 rf 4 Follow-up by: Gordy Savers  MD,  March 16, 2010 12:35 PM    Prescriptions: TRAMADOL HCL 50 MG TABS (TRAMADOL HCL) one every 6 hours as needed for pain  #100 x 4   Entered by:   Duard Brady LPN   Authorized by:   Gordy Savers  MD   Signed by:   Duard Brady LPN on 16/06/9603   Method used:   [facility]        [facility] [phone]* (retail)       [address]       [place], [place]  [address]       Ph: [phone] or [phone]       Fax: [phone]   RxID:   [id number]

## 2010-10-02 NOTE — Progress Notes (Signed)
Summary: prep ?'s   Phone Note Call from Patient Call back at [facility] Phone [phone]   Caller: Patient Call For: Dr. Arlyce Dice Reason for Call: Talk to Nurse Summary of Call: prep ?'s Initial call taken by: Vallarie Mare,  June 22, 2010 1:38 PM  Follow-up for Phone Call        Explained the prep again to patient since it had to be changed Wed. due to the cost.  The pharmacist went over it with her, but she just needed some reinforcement.   Pt. states that she'll do it, and thank you. Follow-up by: Clide Cliff RN,  June 22, 2010 2:07 PM

## 2010-10-02 NOTE — Letter (Signed)
Summary: Results Letter  [facility]  [address] [place], [place] [address]   Phone: [phone]  Fax: [phone]        July 09, 2010 MRN: [id number]    [name] [address] [place], [place]  [address]    Dear Ms. Kulak,  Your hemeoccult cards testing for microscopic bleeding were negative.  No further GI workup is required at this time.   Please continue with the recommendations that we previously discussed. Should you have any further questions or concerns, feel free to contact me.    Sincerely,  Barbette Hair. Arlyce Dice, M.D., [facility]          Sincerely,  Louis Meckel MD  This letter has been electronically signed by your physician.  Appended Document: Results Letter LETTER MAILED

## 2010-10-02 NOTE — Procedures (Signed)
Summary: Colonoscopy  Patient: [name] Note: All result statuses are Final unless otherwise noted.  Tests: (1) Colonoscopy (COL)   COL Colonoscopy           DONE     [facility]     [address]     [place], [place]  [address]           COLONOSCOPY PROCEDURE REPORT           PATIENT:  [name], [name]  MR#:  [id number]     BIRTHDATE:  Oct 23, 1922, 87 yrs. old  GENDER:  female           ENDOSCOPIST:  Barbette Hair. Arlyce Dice, MD     Referred by:           PROCEDURE DATE:  06/28/2010     PROCEDURE:  Surveillance Colonoscopy     ASA CLASS:  Class II     INDICATIONS:  1) Iron deficiency anemia  2) Abdominal pain           MEDICATIONS:   Fentanyl 50 mcg IV, Versed 7 mg IV           DESCRIPTION OF PROCEDURE:   After the risks benefits and     alternatives of the procedure were thoroughly explained, informed     consent was obtained.  Digital rectal exam was performed and     revealed no abnormalities.   The [id number] endoscope was     introduced through the anus and advanced to the cecum, which was     identified by the ileocecal valve, without limitations.  The     quality of the prep was good, using MoviPrep.  The instrument was     then slowly withdrawn as the colon was fully examined.     <<PROCEDUREIMAGES>>           FINDINGS:  Severe diverticulosis was found in the sigmoid colon     (see image11).  Moderate diverticulosis was found in the     descending colon (see image9).  Scattered diverticula were found     (see image3)in the proximal transverse colon.  This was otherwise     a normal examination of the colon (see image1, image2, image5,     image6, image12, and image13).   Retroflexed views in the rectum     revealed no abnormalities.    The time to cecum =  6.25  minutes.     The scope was then withdrawn (time =  6.0  min) from the patient     and the procedure completed.           COMPLICATIONS:  None           ENDOSCOPIC IMPRESSION:     1) Severe  diverticulosis in the sigmoid colon     2) Moderate diverticulosis in the descending colon     3) Diverticula, scattered     4) Otherwise normal examination           Findings do not explain abdominal pain, weight loss, anemia           RECOMMENDATIONS:     1) My office will arrange for you to have a CT scan of abdomen     and pelvis.     2) followup hemeoccults in 1 week           REPEAT EXAM:  No           ______________________________  Barbette Hair. Arlyce Dice, MD           CC: Gordy Savers, MD           n.     Rosalie Doctor:   Barbette Hair. Kaplan at 06/28/2010 02:46 PM           Esperanza Sheets, [id number]  Note: An exclamation mark (!) indicates a result that was not dispersed into the flowsheet. Document Creation Date: 06/28/2010 2:48 PM _______________________________________________________________________  (1) Order result status: Final Collection or observation date-time: 06/28/2010 14:38 Requested date-time:  Receipt date-time:  Reported date-time:  Referring Physician:   Ordering Physician: Melvia Heaps [phone]) Specimen Source:  Source: Launa Grill Order Number: [id number] Lab site:

## 2010-10-02 NOTE — Progress Notes (Signed)
  Patient is coming on Wed. for labs that should have been done on the 20th.

## 2010-10-02 NOTE — Assessment & Plan Note (Signed)
Summary: F/U POST UC VISIT (UTI) // RS   Vital Signs:  Patient profile:   77 year old female Weight:      162 pounds Temp:     98.1 degrees F oral BP sitting:   140 / 80  (right arm) Cuff size:   regular  Vitals Entered By: Duard Brady LPN (March 01, 2010 11:34 AM) CC: f/u urgent care - tx for UTI - culture was neg. --- now with c/o fatigue and light headed Is Patient Diabetic? Yes CBG Result 113   CC:  f/u urgent care - tx for UTI - culture was neg. --- now with c/o fatigue and light headed.  History of Present Illness: 77 year old patient who was seen at the urgent care approximately 2  weeks ago.  She was initially treated for a UTI, but then was contacted two days later, and was told to stop Cipro.  By the past couple weeks.  She has had some episodes of lightheadedness and fatigue; at times is felt that unsteady on her feetin.she has had at least two colonoscopies but none recently.  Laboratory studies, including hematocrit were normal in September of last year.  She denies any abdominal pain or change in her bowel habits.  No history of anemia or GI bleeding.  She does have treated hypertension and type 2 diabetes  Allergies: 1)  ! Pcn 2)  Amoxicillin (Amoxicillin)  Past History:  Past Medical History: Reviewed history from 06/01/2009 and no changes required. Kidney Stones High Cholesterol Diabetes mellitus, type II GERD Hypertension Hyperlipidemia insomnia Headache bifascicular block  Past Surgical History: Reviewed history from 06/01/2009 and no changes required. Cataract extraction bilateral Hemorrhoidectomy Hysterectomy-complete with incidental appendectomy, age 28 negative adenosine Cardiolite study June 2008  colonoscopy (2)  Review of Systems       The patient complains of muscle weakness.  The patient denies anorexia, fever, weight loss, weight gain, vision loss, decreased hearing, hoarseness, chest pain, syncope, dyspnea on exertion, peripheral  edema, prolonged cough, headaches, hemoptysis, abdominal pain, melena, hematochezia, severe indigestion/heartburn, hematuria, incontinence, genital sores, suspicious skin lesions, transient blindness, difficulty walking, depression, unusual weight change, abnormal bleeding, enlarged lymph nodes, angioedema, and breast masses.    Physical Exam  General:  overweight-appearing.  no distress.  Blood pressure 130/80overweight-appearing.   Head:  Normocephalic and atraumatic without obvious abnormalities. No apparent alopecia or balding. Eyes:  No corneal or conjunctival inflammation noted. EOMI. Perrla. Funduscopic exam benign, without hemorrhages, exudates or papilledema. Vision grossly normal. Mouth:  Oral mucosa and oropharynx without lesions or exudates.  Teeth in good repair. Neck:  No deformities, masses, or tenderness noted. Lungs:  Normal respiratory effort, chest expands symmetrically. Lungs are clear to auscultation, no crackles or wheezes. Heart:  Normal rate and regular rhythm. S1 and S2 normal without gallop, murmur, click, rub or other extra sounds. Abdomen:  Bowel sounds positive,abdomen soft and non-tender without masses, organomegaly or hernias noted. Skin:  slightly pale  Diabetes Management Exam:    Eye Exam:       Eye Exam done here today          Results: normal   Impression & Recommendations:  Problem # 1:  HYPERTENSION (ICD-401.9)  Her updated medication list for this problem includes:    Hydrochlorothiazide 25 Mg Tabs (Hydrochlorothiazide) .Marland Kitchen... 1 once daily    Her updated medication list for this problem includes:    Hydrochlorothiazide 25 Mg Tabs (Hydrochlorothiazide) .Marland Kitchen... 1 once daily  Orders: Venipuncture ([address]) TLB-BMP (Basic Metabolic  Panel-BMET) (80048-METABOL) TLB-CBC Platelet - w/Differential (85025-CBCD) TLB-Hepatic/Liver Function Pnl (80076-HEPATIC) TLB-TSH (Thyroid Stimulating Hormone) (84443-TSH)  Problem # 2:  ANEMIA, DEFICIENCY, HX OF  (ICD-V12.3)  Orders: Venipuncture ([address]) TLB-BMP (Basic Metabolic Panel-BMET) (80048-METABOL) TLB-CBC Platelet - w/Differential (85025-CBCD) TLB-Hepatic/Liver Function Pnl (80076-HEPATIC) TLB-TSH (Thyroid Stimulating Hormone) (84443-TSH)  Problem # 3:  DIABETES MELLITUS, TYPE II (ICD-250.00)  Her updated medication list for this problem includes:    Bayer Childrens Aspirin 81 Mg Chew (Aspirin) .Marland Kitchen... Take 1 tablet by mouth once a day    Metformin Hcl 500 Mg Tb24 (Metformin hcl) .Marland Kitchen... 1 once daily  Orders: Capillary Blood Glucose/CBG (60454) Venipuncture ([address]) TLB-BMP (Basic Metabolic Panel-BMET) (80048-METABOL) TLB-CBC Platelet - w/Differential (85025-CBCD) TLB-Hepatic/Liver Function Pnl (80076-HEPATIC) TLB-TSH (Thyroid Stimulating Hormone) (84443-TSH) TLB-A1C / Hgb A1C (Glycohemoglobin) (83036-A1C)  Her updated medication list for this problem includes:    Bayer Childrens Aspirin 81 Mg Chew (Aspirin) .Marland Kitchen... Take 1 tablet by mouth once a day    Metformin Hcl 500 Mg Tb24 (Metformin hcl) .Marland Kitchen... 1 once daily  Complete Medication List: 1)  Bayer Childrens Aspirin 81 Mg Chew (Aspirin) .... Take 1 tablet by mouth once a day 2)  Hydrochlorothiazide 25 Mg Tabs (Hydrochlorothiazide) .Marland Kitchen.. 1 once daily 3)  Metformin Hcl 500 Mg Tb24 (Metformin hcl) .Marland Kitchen.. 1 once daily 4)  Simvastatin 80 Mg Tabs (Simvastatin) .... 1/2 once daily 5)  Xalatan 0.005 % Soln (Latanoprost) .... As dir 6)  Omeprazole 20 Mg Cpdr (Omeprazole) .... One twice daily 7)  Flonase 50 Mcg/act Susp (Fluticasone propionate) .... Use daily 8)  Allegra-d 12 Hour 60-120 Mg Xr12h-tab (Fexofenadine-pseudoephedrine) .Marland Kitchen.. 1 two times a day 9)  Tramadol Hcl 50 Mg Tabs (Tramadol hcl) .... One every 6 hours as needed for pain  Patient Instructions: 1)  Please schedule a follow-up appointment in 1 month. 2)  Limit your Sodium (Salt). 3)  It is important that you exercise regularly at least 20 minutes 5 times a week. If you develop  chest pain, have severe difficulty breathing, or feel very tired , stop exercising immediately and seek medical attention.

## 2010-10-02 NOTE — Assessment & Plan Note (Signed)
Summary: 2 WK ROV/NJR   Vital Signs:  Patient profile:   77 year old female Weight:      158 pounds Temp:     98.1 degrees F oral BP sitting:   110 / 70  (left arm) Cuff size:   regular  Vitals Entered By: Duard Brady LPN (May 09, 2010 10:44 AM) CC: 2 wk rov - doing better Is Patient Diabetic? Yes Did you bring your meter with you today? No Flu Vaccine Consent Questions     Do you have a history of severe allergic reactions to this vaccine? no    Any prior history of allergic reactions to egg and/or gelatin? no    Do you have a sensitivity to the preservative Thimersol? no    Do you have a past history of Guillan-Barre Syndrome? no    Do you currently have an acute febrile illness? no    Have you ever had a severe reaction to latex? no    Vaccine information given and explained to patient? yes    Are you currently pregnant? no    Lot Number:[id number]   Exp Date:03/02/2011   Site Given  Left Deltoid IM   CC:  2 wk rov - doing better.  History of Present Illness: 77 year old patient who is seen today for her two-week follow-up.  She presented earlier with weakness, headache, anorexia, and a general sense of unwellness.  Polymyalgia rheumatica, was considered a sedimentation rate was normal, and she had no response to a brief trial of prednisone.  The prednisone therapy has since been discontinued.  Today she feels well and feels that she is back to baseline.  She has type 2 diabetes, which has been stable.  She does have a history of iron deficiency anemia.  Allergies: 1)  ! Pcn 2)  Amoxicillin (Amoxicillin)  Past History:  Past Medical History: Kidney Stones High Cholesterol Diabetes mellitus, type II GERD Hypertension Hyperlipidemia insomnia Headache bifascicular block Anemia-iron deficiency  Past Surgical History: Reviewed history from 03/29/2010 and no changes required. Cataract extraction bilateral Hemorrhoidectomy Hysterectomy-complete with  incidental appendectomy, age 24 negative adenosine Cardiolite study June 2008  colonoscopy (2)- last aprrox 2008 (best patient estimate)  Review of Systems  The patient denies anorexia, fever, weight loss, weight gain, vision loss, decreased hearing, hoarseness, chest pain, syncope, dyspnea on exertion, peripheral edema, prolonged cough, headaches, hemoptysis, abdominal pain, melena, hematochezia, severe indigestion/heartburn, hematuria, incontinence, genital sores, muscle weakness, suspicious skin lesions, transient blindness, difficulty walking, depression, unusual weight change, abnormal bleeding, enlarged lymph nodes, angioedema, and breast masses.    Physical Exam  General:  overweight-appearing.  no distress.  Normal blood pressure Head:  Normocephalic and atraumatic without obvious abnormalities. No apparent alopecia or balding. Eyes:  No corneal or conjunctival inflammation noted. EOMI. Perrla. Funduscopic exam benign, without hemorrhages, exudates or papilledema. Vision grossly normal. Mouth:  Oral mucosa and oropharynx without lesions or exudates.  Teeth in good repair. Neck:  No deformities, masses, or tenderness noted. Lungs:  Normal respiratory effort, chest expands symmetrically. Lungs are clear to auscultation, no crackles or wheezes. Heart:  Normal rate and regular rhythm. S1 and S2 normal without gallop, murmur, click, rub or other extra sounds. Abdomen:  Bowel sounds positive,abdomen soft and non-tender without masses, organomegaly or hernias noted.   Impression & Recommendations:  Problem # 1:  DIABETES MELLITUS (ICD-250.00)  Her updated medication list for this problem includes:    Bayer Childrens Aspirin 81 Mg Chew (Aspirin) .Marland Kitchen... Take 1 tablet  by mouth once a day    Metformin Hcl 500 Mg Tb24 (Metformin hcl) .Marland Kitchen... 1 once daily  Her updated medication list for this problem includes:    Bayer Childrens Aspirin 81 Mg Chew (Aspirin) .Marland Kitchen... Take 1 tablet by mouth once a  day    Metformin Hcl 500 Mg Tb24 (Metformin hcl) .Marland Kitchen... 1 once daily  Problem # 2:  ANEMIA, DEFICIENCY, HX OF (ICD-V12.3)  Orders: Venipuncture (60454) TLB-CBC Platelet - w/Differential (85025-CBCD)  Complete Medication List: 1)  Bayer Childrens Aspirin 81 Mg Chew (Aspirin) .... Take 1 tablet by mouth once a day 2)  Hydrochlorothiazide 25 Mg Tabs (Hydrochlorothiazide) .Marland Kitchen.. 1 once daily 3)  Metformin Hcl 500 Mg Tb24 (Metformin hcl) .Marland Kitchen.. 1 once daily 4)  Simvastatin 80 Mg Tabs (Simvastatin) .... 1/2 once daily 5)  Xalatan 0.005 % Soln (Latanoprost) .... As dir 6)  Omeprazole 20 Mg Cpdr (Omeprazole) .... One twice daily 7)  Flonase 50 Mcg/act Susp (Fluticasone propionate) .... Use daily 8)  Allegra-d 12 Hour 60-120 Mg Xr12h-tab (Fexofenadine-pseudoephedrine) .Marland Kitchen.. 1 two times a day 9)  Hydrocodone-acetaminophen 5-500 Mg Tabs (Hydrocodone-acetaminophen) .... One every 8 hours for pain 10)  Advil 200 Mg Tabs (Ibuprofen) .... 2 every 6 hrs prn 11)  Prednisone 20 Mg Tabs (Prednisone) .... 2 by mouth qam  Other Orders: Flu Vaccine 50yrs + MEDICARE PATIENTS (U9811) Administration Flu vaccine - MCR (B1478)  Patient Instructions: 1)  Please schedule a follow-up appointment in 3 months. 2)  Limit your Sodium (Salt) to less than 2 grams a day(slightly less than 1/2 a teaspoon) to prevent fluid retention, swelling, or worsening of symptoms. 3)  It is important that you exercise regularly at least 20 minutes 5 times a week. If you develop chest pain, have severe difficulty breathing, or feel very tired , stop exercising immediately and seek medical attention.

## 2010-10-02 NOTE — Progress Notes (Signed)
Summary: lab today  Phone Note Call from Patient   Caller: Patient Call For: Gordy Savers  MD Summary of Call: What labs do you want today? [phone] Initial call taken by: Lynann Beaver CMA,  April 23, 2010 9:13 AM  Follow-up for Phone Call        sedimentation rate Follow-up by: Gordy Savers  MD,  April 23, 2010 9:16 AM  Additional Follow-up for Phone Call Additional follow up Details #1::        spoke with pt - Dr. Amador Cunas does want lab this AM - order put into computer, he also wants to speak with pt before she leaves. KIK Additional Follow-up by: Duard Brady LPN,  April 23, 2010 10:32 AM  New Problems: OTHER ABNORMAL BLOOD CHEMISTRY (ICD-790.6)   New Problems: OTHER ABNORMAL BLOOD CHEMISTRY (ICD-790.6)

## 2010-10-02 NOTE — Progress Notes (Signed)
Summary: results request   Phone Note Call from Patient Call back at Home Phone [phone]   Caller: Patient Call For: Dr. Arlyce Dice Reason for Call: Talk to Nurse Summary of Call: would like results from CT scan Initial call taken by: Vallarie Mare,  July 31, 2010 9:20 AM  Follow-up for Phone Call        Patient notified of radiology results Follow-up by: Darcey Nora RN, CGRN,  July 31, 2010 10:29 AM

## 2010-10-02 NOTE — Progress Notes (Signed)
Summary: f/u on labs  Phone Note Call from Patient   Caller: Patient Call For: Cindy Savers  MD Reason for Call: Talk to Nurse Summary of Call: took call - pt was seen last wk as a f/u from urgent care where she was told she was anemic . Dr. Amador Cunas did labs , still feeling lightheaded. Wants to know if she should be taking otc iron.   Initial call taken by: Duard Brady LPN,  March 08, 1609 1:41 PM  Follow-up for Phone Call        additional lab - hemoccult- requested , done and results neg x3 .   called pt back - informed of lab results - I told her not to take anything right now until Dr. Amador Cunas could review next week and the advise [place]. Discussed hydrating, eating well and to call or go to ER if gets worse. KIK Follow-up by: Duard Brady LPN,  March 09, 9603 1:42 PM  Additional Follow-up for Phone Call Additional follow up Details #1::        Suggest one iron  tablet a day and multivit; notify that no evidence of GI bleeding; recheck CBC with diff in 2 months Additional Follow-up by: Cindy Savers  MD,  March 11, 2010 8:15 PM    Additional Follow-up for Phone Call Additional follow up Details #2::    spoke with pt - informed that Dr. Amador Cunas would like for her use OTC Fe - recheck in 2 mos. KIK Follow-up by: Duard Brady LPN,  March 12, 2010 11:55 AM

## 2010-10-02 NOTE — Assessment & Plan Note (Signed)
Summary: hemoptysis/acute URI/dm   Vital Signs:  Patient profile:   77 year old female Weight:      162 pounds O2 Sat:      97 % on Room air Temp:     98.4 degrees F oral BP sitting:   104 / 70  (left arm) Cuff size:   regular  Vitals Entered By: Duard Brady LPN (October 13, 2009 12:57 PM)  O2 Flow:  Room air CC: c/o cough , chest congestion, coughing up small amt blood  CBG Result 88   CC:  c/o cough , chest congestion, and coughing up small amt blood .  History of Present Illness: 77 year old patient who presents with a two to 3 day history of cold symptoms.  She is a lifelong nonsmoker.  Two days ago.  She had fever, which has resolved.  She still has cough, which has improved, but has noted some slight blood-tinged sputum.  Denies any chest pain or shortness of breath.  She does have diabetes, hypertension, and a history of allergic rhinitis.  Plan  Allergies: 1)  ! Pcn 2)  Amoxicillin (Amoxicillin)  Past History:  Past Medical History: Reviewed history from 06/01/2009 and no changes required. Kidney Stones High Cholesterol Diabetes mellitus, type II GERD Hypertension Hyperlipidemia insomnia Headache bifascicular block  Past Surgical History: Reviewed history from 06/01/2009 and no changes required. Cataract extraction bilateral Hemorrhoidectomy Hysterectomy-complete with incidental appendectomy, age 41 negative adenosine Cardiolite study June 2008  colonoscopy (2)  Review of Systems       The patient complains of anorexia, fever, hoarseness, and prolonged cough.  The patient denies weight loss, weight gain, vision loss, decreased hearing, chest pain, syncope, dyspnea on exertion, peripheral edema, headaches, hemoptysis, abdominal pain, melena, hematochezia, severe indigestion/heartburn, hematuria, incontinence, genital sores, muscle weakness, suspicious skin lesions, transient blindness, difficulty walking, depression, unusual weight change, abnormal  bleeding, enlarged lymph nodes, angioedema, and breast masses.    Physical Exam  General:  Well-developed,well-nourished,in no acute distress; alert,appropriate and cooperative throughout examination Head:  Normocephalic and atraumatic without obvious abnormalities. No apparent alopecia or balding. Eyes:  No corneal or conjunctival inflammation noted. EOMI. Perrla. Funduscopic exam benign, without hemorrhages, exudates or papilledema. Vision grossly normal. Ears:  External ear exam shows no significant lesions or deformities.  Otoscopic examination reveals clear canals, tympanic membranes are intact bilaterally without bulging, retraction, inflammation or discharge. Hearing is grossly normal bilaterally. Mouth:  Oral mucosa and oropharynx without lesions or exudates.  Teeth in good repair. Neck:  No deformities, masses, or tenderness noted. Lungs:  Normal respiratory effort, chest expands symmetrically. Lungs are clear to auscultation, no crackles or wheezes. Heart:  Normal rate and regular rhythm. S1 and S2 normal without gallop, murmur, click, rub or other extra sounds. Abdomen:  Bowel sounds positive,abdomen soft and non-tender without masses, organomegaly or hernias noted.   Impression & Recommendations:  Problem # 1:  URI (ICD-465.9)  Her updated medication list for this problem includes:    Bayer Childrens Aspirin 81 Mg Chew (Aspirin) .Marland Kitchen... Take 1 tablet by mouth once a day    Allegra-d 12 Hour 60-120 Mg Xr12h-tab (Fexofenadine-pseudoephedrine) .Marland Kitchen... 1 two times a day  Her updated medication list for this problem includes:    Bayer Childrens Aspirin 81 Mg Chew (Aspirin) .Marland Kitchen... Take 1 tablet by mouth once a day    Allegra-d 12 Hour 60-120 Mg Xr12h-tab (Fexofenadine-pseudoephedrine) .Marland Kitchen... 1 two times a day  Problem # 2:  HYPERTENSION (ICD-401.9)  Her updated medication list for  this problem includes:    Hydrochlorothiazide 25 Mg Tabs (Hydrochlorothiazide) .Marland Kitchen... 1 once daily  Her  updated medication list for this problem includes:    Hydrochlorothiazide 25 Mg Tabs (Hydrochlorothiazide) .Marland Kitchen... 1 once daily  Problem # 3:  DIABETES MELLITUS, TYPE II (ICD-250.00)  Her updated medication list for this problem includes:    Bayer Childrens Aspirin 81 Mg Chew (Aspirin) .Marland Kitchen... Take 1 tablet by mouth once a day    Metformin Hcl 500 Mg Tb24 (Metformin hcl) .Marland Kitchen... 1 once daily  Orders: Capillary Blood Glucose/CBG (60454)  Her updated medication list for this problem includes:    Bayer Childrens Aspirin 81 Mg Chew (Aspirin) .Marland Kitchen... Take 1 tablet by mouth once a day    Metformin Hcl 500 Mg Tb24 (Metformin hcl) .Marland Kitchen... 1 once daily  Complete Medication List: 1)  Bayer Childrens Aspirin 81 Mg Chew (Aspirin) .... Take 1 tablet by mouth once a day 2)  Hydrochlorothiazide 25 Mg Tabs (Hydrochlorothiazide) .Marland Kitchen.. 1 once daily 3)  Metformin Hcl 500 Mg Tb24 (Metformin hcl) .Marland Kitchen.. 1 once daily 4)  Simvastatin 80 Mg Tabs (Simvastatin) .... 1/2 once daily 5)  Xalatan 0.005 % Soln (Latanoprost) .... As dir 6)  Omeprazole 20 Mg Cpdr (Omeprazole) .... One twice daily 7)  Flonase 50 Mcg/act Susp (Fluticasone propionate) .... Use daily 8)  Allegra-d 12 Hour 60-120 Mg Xr12h-tab (Fexofenadine-pseudoephedrine) .Marland Kitchen.. 1 two times a day  Patient Instructions: 1)  Please schedule a follow-up appointment in 3 months. 2)  Limit your Sodium (Salt). 3)  Get plenty of rest, drink lots of clear liquids, and use Tylenol or Ibuprofen for fever and comfort. Return in 7-10 days if you're not better:sooner if you're feeling worse.

## 2010-10-02 NOTE — Progress Notes (Signed)
Summary: CT ok headaches cause  Phone Note Call from Patient Call back at Home Phone [phone]   Reason for Call: Lab or Test Results Summary of Call: CT okay.  Still having headaches during night that keeps her from sleeping & ams that last until about noon & then they go away.  Takes the med before bed or if she wakes during the night.  Takes one in the am.  Tramadol 50mg .  Helps some, but not completely.  HydroAcet 5-500mg  helps.  What do you think is causing the headaches & anything to take for the cause?  [facility].  Allergic to Pcn.   Initial call taken by: Rudy Jew, RN,  April 06, 2010 11:23 AM  Follow-up for Phone Call        add advil 2 every 6 hours prn Follow-up by: Gordy Savers  MD,  April 06, 2010 12:57 PM  Additional Follow-up for Phone Call Additional follow up Details #1::        Phone Call Completed Additional Follow-up by: Rudy Jew, RN,  April 06, 2010 1:12 PM    New/Updated Medications: ADVIL 200 MG TABS (IBUPROFEN) 2 every 6 hrs prn

## 2010-10-02 NOTE — Letter (Signed)
Summary: Diabetic Instructions  [facility]  [address] [place], [place] [address]   Phone: [phone]  Fax: [phone]    JAICE LAGUE 06/13/1923 MRN: [id number]   X    ORAL DIABETIC MEDICATION INSTRUCTIONS  The day before your procedure:   Take your diabetic pill as you do normally  The day of your procedure:   Do not take your diabetic pill    We will check your blood sugar levels during the admission process and again in Recovery before discharging you home  ________________________________________________________________________  _  _   INSULIN (LONG ACTING) MEDICATION INSTRUCTIONS (Lantus, NPH, 70/30, Humulin, Novolin-N)   The day before your procedure:   Take  your regular evening dose    The day of your procedure:   Do not take your morning dose    _  _   INSULIN (SHORT ACTING) MEDICATION INSTRUCTIONS (Regular, Humulog, Novolog)   The day before your procedure:   Do not take your evening dose   The day of your procedure:   Do not take your morning dose   _  _   INSULIN PUMP MEDICATION INSTRUCTIONS  We will contact the physician managing your diabetic care for written dosage instructions for the day before your procedure and the day of your procedure.  Once we have received the instructions, we will contact you.

## 2010-10-02 NOTE — Progress Notes (Signed)
Summary: appt ?'s   Phone Note Call from Patient Call back at Home Phone [phone]   Caller: Patient Call For: Dr. Arlyce Dice Reason for Call: Talk to Nurse Summary of Call: wants to know when she needs to come in for CT scan of abd Initial call taken by: Vallarie Mare,  July 19, 2010 9:09 AM  Follow-up for Phone Call        Scheduled pt for her CT abdomen/Pelvis on 07/25/2010 at 2pm. Pt aware of instructions and has contrast.  Follow-up by: Merri Ray CMA Duncan Dull),  July 19, 2010 9:44 AM  Additional Follow-up for Phone Call Additional follow up Details #1::        New orders completed Additional Follow-up by: Merri Ray CMA Duncan Dull),  July 19, 2010 9:46 AM

## 2010-10-02 NOTE — Letter (Signed)
Summary: [facility] Instructions  [facility]  [address] [place], [place] [address]   Phone: [phone]  Fax: [phone]       [name]    22-Jul-1923    MRN: [id number]        Procedure Day /Date:  Thursday 06/28/2010     Arrival Time: 1:30 pm     Procedure Time: 2:30 pm     Location of Procedure:                    _ x_  [facility] (4th Floor)                        PREPARATION FOR COLONOSCOPY WITH MOVIPREP   Starting 5 days prior to your procedure Saturday 10/22 do not eat nuts, seeds, popcorn, corn, beans, peas,  salads, or any raw vegetables.  Do not take any fiber supplements (e.g. Metamucil, Citrucel, and Benefiber).  THE DAY BEFORE YOUR PROCEDURE         DATE: Wednesday 10/26  1.  Drink clear liquids the entire day-NO SOLID FOOD  2.  Do not drink anything colored red or purple.  Avoid juices with pulp.  No orange juice.  3.  Drink at least 64 oz. (8 glasses) of fluid/clear liquids during the day to prevent dehydration and help the prep work efficiently.  CLEAR LIQUIDS INCLUDE: Water Jello Ice Popsicles Tea (sugar ok, no milk/cream) Powdered fruit flavored drinks Coffee (sugar ok, no milk/cream) Gatorade Juice: apple, white grape, white cranberry  Lemonade Clear bullion, consomm, broth Carbonated beverages (any kind) Strained chicken noodle soup Hard Candy                             4.  In the morning, mix first dose of MoviPrep solution:    Empty 1 Pouch A and 1 Pouch B into the disposable container    Add lukewarm drinking water to the top line of the container. Mix to dissolve    Refrigerate (mixed solution should be used within 24 hrs)  5.  Begin drinking the prep at 5:00 p.m. The MoviPrep container is divided by 4 marks.   Every 15 minutes drink the solution down to the next mark (approximately 8 oz) until the full liter is complete.   6.  Follow completed prep with 16 oz of clear liquid of your choice  (Nothing red or purple).  Continue to drink clear liquids until bedtime.  7.  Before going to bed, mix second dose of MoviPrep solution:    Empty 1 Pouch A and 1 Pouch B into the disposable container    Add lukewarm drinking water to the top line of the container. Mix to dissolve    Refrigerate  THE DAY OF YOUR PROCEDURE      DATE: Thursday 10/27  Beginning at 9:30 a.m. (5 hours before procedure):         1. Every 15 minutes, drink the solution down to the next mark (approx 8 oz) until the full liter is complete.  2. Follow completed prep with 16 oz. of clear liquid of your choice.    3. You may drink clear liquids until 12:30 pm (2 HOURS BEFORE PROCEDURE).   MEDICATION INSTRUCTIONS  Unless otherwise instructed, you should take regular prescription medications with a small sip of water   as early as possible the morning of your  procedure.  Diabetic patients - see separate instructions.   Additional medication instructions: _HCTZ hold am dose the day of your procedure.         OTHER INSTRUCTIONS  You will need a responsible adult at least 75 years of age to accompany you and drive you home.   This person must remain in the waiting room during your procedure.  Wear loose fitting clothing that is easily removed.  Leave jewelry and other valuables at home.  However, you may wish to bring a book to read or  an iPod/MP3 player to listen to music as you wait for your procedure to start.  Remove all body piercing jewelry and leave at home.  Total time from sign-in until discharge is approximately 2-3 hours.  You should go home directly after your procedure and rest.  You can resume normal activities the  day after your procedure.  The day of your procedure you should not:   Drive   Make legal decisions   Operate machinery   Drink alcohol   Return to work  You will receive specific instructions about eating, activities and medications before you  leave.    The above instructions have been reviewed and explained to me by   Clide Cliff, RN______________________    I fully understand and can verbalize these instructions _____________________________ Date _________   Appended Document: Moviprep Instructions Mopviprep was replaced by miralax, because the patient could not afford the moviprep.  Instructions were given over the phone to the pharmacist who said that she would include them in the miralax for the patient.   The patient will call us if she has any questions.  She is to follow our diabetic protocol as well.  Clide Cliff, RN

## 2010-10-02 NOTE — Progress Notes (Signed)
Summary: clarification simvastatin  Phone Note From Pharmacy   Caller: [facility] [address] [place] Call For: clarification simvastatin  Summary of Call: rec'd fax - need clarification on simvastatin - 80mg  or 40mg  ? and is the dose 1/2 once daily?    I called [facility] - this was called in 12/13/09 also - yes it is 80mg  1/2 by mouth once daily - KIK Initial call taken by: Duard Brady LPN,  December 20, 2009 9:13 AM

## 2010-10-02 NOTE — Letter (Signed)
Summary: Results Letter  [facility]  [address] [place], [place] [address]   Phone: [phone]  Fax: [phone]        July 30, 2010 MRN: [id number]    [name] [address] [place], [place]  [address]    Dear [name],  Your CT results did not show any remarkable findings.  Please continue with the recommendations previously discussed.  Should you have any further questions or immediate concers, feel free to contact me.  Sincerely,  Barbette Hair. Arlyce Dice, M.D., [facility]          Sincerely,  Louis Meckel MD  This letter has been electronically signed by your physician.  Appended Document: Results Letter letter mailed

## 2010-10-02 NOTE — Progress Notes (Signed)
Summary: prednisone    Caller: Patient Call For: Gordy Savers  MD Summary of Call: Pt is asking for lab results ASAP.  Needs to send someone to the pharmacy before 1 pm. [phone] Initial call taken by: Lynann Beaver CMA,  April 20, 2010 11:33 AM  Follow-up for Phone Call        Hgb stable .  WBC is elevated. I have discussed with Dr Kirtland Bouchard and he plans to call her back today. Follow-up by: Evelena Peat MD,  April 20, 2010 1:07 PM    Additional Follow-up for Phone Call Additional follow up Details #2::    pt aware [facility] KIK Follow-up by: Duard Brady LPN,  April 20, 2010 5:36 PM  New/Updated Medications: PREDNISONE 20 MG TABS (PREDNISONE) 2 by mouth qAM Prescriptions: PREDNISONE 20 MG TABS (PREDNISONE) 2 by mouth qAM  #20 x 0   Entered by:   Duard Brady LPN   Authorized by:   Gordy Savers  MD   Signed by:   Duard Brady LPN on 45/40/9811   Method used:   Faxed to ...       [facility] [phone]ail[facility]       [phone] [place]  [address]       Ph: [phone] or [phone]       Fax: [phone]   RxID:   [id number]  call in prednisone  10 mg   #20 two every am  ([facility])

## 2010-10-02 NOTE — Letter (Signed)
Summary: Results Letter  [facility]  [address] [place], [place] [address]   Phone: [phone]  Fax: [phone]        May 30, 2010 MRN: [id number]    DAVEDA LAROCK [address] [place], [place]  [address]    Dear Ms. Streck,  It is my pleasure to have treated you recently as a new patient in my office. I appreciate your confidence and the opportunity to participate in your care.  Since I do have a busy inpatient endoscopy schedule and office schedule, my office hours vary weekly. I am, however, available for emergency calls everyday through my office. If I am not available for an urgent office appointment, another one of our gastroenterologist will be able to assist you.  My well-trained staff are prepared to help you at all times. For emergencies after office hours, a physician from our Gastroenterology section is always available through my 24 hour answering service  Once again I welcome you as a new patient and I look forward to a happy and healthy relationship             Sincerely,  Louis Meckel MD  This letter has been electronically signed by your physician.  Appended Document: Results Letter LETTER MAILED

## 2010-10-02 NOTE — Assessment & Plan Note (Signed)
Summary: 4 MONTH ROV/NJR   Vital Signs:  Patient profile:   77 year old female Weight:      164 pounds Temp:     98.7 degrees F oral BP sitting:   160 / 78  (left arm) Cuff size:   regular  Vitals Entered By: Raechel Ache, RN (September 21, 2009 12:59 PM) CC: C/o dizziness, headache and neck hurts.   CC:  C/o dizziness and headache and neck hurts..  History of Present Illness: an 77 year old patient has a history of type 2 diabetes and hypertension.  The hypertension is controlled with diuretic therapy.  Complains today include posterior neck pain and headaches.  This only occurs when she lies down to sleep at night.  Her blood sugars remained normal.  Her hemoglobin A1c3 months ago was nicely controlled.  She does monitor home blood pressure readings at home.  Allergies: 1)  ! Pcn 2)  Amoxicillin (Amoxicillin)  Past History:  Past Medical History: Reviewed history from 06/01/2009 and no changes required. Kidney Stones High Cholesterol Diabetes mellitus, type II GERD Hypertension Hyperlipidemia insomnia Headache bifascicular block  Review of Systems       The patient complains of headaches.  The patient denies anorexia, fever, weight loss, weight gain, vision loss, decreased hearing, hoarseness, chest pain, syncope, dyspnea on exertion, peripheral edema, prolonged cough, hemoptysis, abdominal pain, melena, hematochezia, severe indigestion/heartburn, hematuria, incontinence, genital sores, muscle weakness, suspicious skin lesions, transient blindness, difficulty walking, depression, unusual weight change, abnormal bleeding, enlarged lymph nodes, angioedema, and breast masses.    Physical Exam  General:  Well-developed,well-nourished,in no acute distress; alert,appropriate and cooperative throughout examination; 150/80 Head:  Normocephalic and atraumatic without obvious abnormalities. No apparent alopecia or balding. Eyes:  No corneal or conjunctival inflammation noted.  EOMI. Perrla. Funduscopic exam benign, without hemorrhages, exudates or papilledema. Vision grossly normal. Ears:  External ear exam shows no significant lesions or deformities.  Otoscopic examination reveals clear canals, tympanic membranes are intact bilaterally without bulging, retraction, inflammation or discharge. Hearing is grossly normal bilaterally. Mouth:  Oral mucosa and oropharynx without lesions or exudates.  Teeth in good repair. Neck:  No deformities, masses, or tenderness noted. Lungs:  Normal respiratory effort, chest expands symmetrically. Lungs are clear to auscultation, no crackles or wheezes. Heart:  Normal rate and regular rhythm. S1 and S2 normal without gallop, murmur, click, rub or other extra sounds. Abdomen:  Bowel sounds positive,abdomen soft and non-tender without masses, organomegaly or hernias noted. Msk:  No deformity or scoliosis noted of thoracic or lumbar spine.   Extremities:  No clubbing, cyanosis, edema, or deformity noted with normal full range of motion of all joints.     Impression & Recommendations:  Problem # 1:  HEADACHE (ICD-784.0)  Her updated medication list for this problem includes:    Bayer Childrens Aspirin 81 Mg Chew (Aspirin) .Marland Kitchen... Take 1 tablet by mouth once a day  Her updated medication list for this problem includes:    Bayer Childrens Aspirin 81 Mg Chew (Aspirin) .Marland Kitchen... Take 1 tablet by mouth once a day  Problem # 2:  HYPERLIPIDEMIA (ICD-272.4)  Her updated medication list for this problem includes:    Simvastatin 80 Mg Tabs (Simvastatin) .Marland Kitchen... 1/2 once daily  Her updated medication list for this problem includes:    Simvastatin 80 Mg Tabs (Simvastatin) .Marland Kitchen... 1/2 once daily  Problem # 3:  HYPERTENSION (ICD-401.9)  Her updated medication list for this problem includes:    Hydrochlorothiazide 25 Mg Tabs (Hydrochlorothiazide) .Marland KitchenMarland KitchenMarland KitchenMarland Kitchen  1 once daily  Her updated medication list for this problem includes:    Hydrochlorothiazide 25 Mg  Tabs (Hydrochlorothiazide) .Marland Kitchen... 1 once daily  Problem # 4:  DIABETES MELLITUS, TYPE II (ICD-250.00)  Her updated medication list for this problem includes:    Bayer Childrens Aspirin 81 Mg Chew (Aspirin) .Marland Kitchen... Take 1 tablet by mouth once a day    Metformin Hcl 500 Mg Tb24 (Metformin hcl) .Marland Kitchen... 1 once daily    Her updated medication list for this problem includes:    Bayer Childrens Aspirin 81 Mg Chew (Aspirin) .Marland Kitchen... Take 1 tablet by mouth once a day    Metformin Hcl 500 Mg Tb24 (Metformin hcl) .Marland Kitchen... 1 once daily  Complete Medication List: 1)  Bayer Childrens Aspirin 81 Mg Chew (Aspirin) .... Take 1 tablet by mouth once a day 2)  Hydrochlorothiazide 25 Mg Tabs (Hydrochlorothiazide) .Marland Kitchen.. 1 once daily 3)  Metformin Hcl 500 Mg Tb24 (Metformin hcl) .Marland Kitchen.. 1 once daily 4)  Simvastatin 80 Mg Tabs (Simvastatin) .... 1/2 once daily 5)  Xalatan 0.005 % Soln (Latanoprost) .... As dir 6)  Omeprazole 20 Mg Cpdr (Omeprazole) .... One twice daily 7)  Flonase 50 Mcg/act Susp (Fluticasone propionate) .... Use daily 8)  Allegra-d 12 Hour 60-120 Mg Xr12h-tab (Fexofenadine-pseudoephedrine) .Marland Kitchen.. 1 two times a day  Other Orders: Venipuncture (09811) TLB-A1C / Hgb A1C (Glycohemoglobin) (83036-A1C)  Patient Instructions: 1)  Please schedule a follow-up appointment in 3 months. 2)  Limit your Sodium (Salt). 3)  It is important that you exercise regularly at least 20 minutes 5 times a week. If you develop chest pain, have severe difficulty breathing, or feel very tired , stop exercising immediately and seek medical attention. 4)  Check your blood sugars regularly. If your readings are usually above : or below 70 you should contact our office. 5)  It is important that your Diabetic A1c level is checked every 3 months. 6)  See your eye doctor yearly to check for diabetic eye damage.

## 2010-10-02 NOTE — Assessment & Plan Note (Signed)
Summary: anemia--ch.    History of Present Illness Visit Type: Initial Consult Primary GI MD: Melvia Heaps MD [facility] Primary Provider: Marzella Schlein Requesting Provider: Marzella Schlein Chief Complaint: Anemia issues pt c/o loss of appetite History of Present Illness:   [name] is a pleasant 77 year old white female referred at the request of  Eleonore Chiquito, M.D. for evaluation of anemia, weight loss and abdominal pain.  Over the last 8 months she has lost over 20 pounds.  She complains of anorexia.  She is also complaining of postprandial upper abdominal pain.  She denies dysphagia or odynophagia.  She takes omeprazole twice a day.  She uses Advil sparingly.  There has been no change in bowel habits, melena or hematochezia.  She apparently underwent colonoscopy 5 years ago.  I am unable to obtain these records.  She has had a stable anemia.  Hemoglobin in July 2011 was 10.6 and 11.7 in September, 2011.  She takes supplemental iron.  Serum ferritin level was 7.8 in July.  The patient has diabetes, glaucoma and a bifascicular block.    GI Review of Systems    Reports loss of appetite.      Denies abdominal pain, acid reflux, belching, bloating, chest pain, dysphagia with liquids, dysphagia with solids, heartburn, nausea, vomiting, vomiting blood, weight loss, and  weight gain.        Denies anal fissure, black tarry stools, change in bowel habit, constipation, diarrhea, diverticulosis, fecal incontinence, heme positive stool, hemorrhoids, irritable bowel syndrome, jaundice, light color stool, liver problems, rectal bleeding, and  rectal pain.    Current Medications (verified): 1)  Bayer Childrens Aspirin 81 Mg Chew (Aspirin) .... Take 1 Tablet By Mouth Once A Day 2)  Hydrochlorothiazide 25 Mg Tabs (Hydrochlorothiazide) .Marland Kitchen.. 1 Once Daily 3)  Metformin Hcl 500 Mg Tb24 (Metformin Hcl) .Marland Kitchen.. 1 Once Daily 4)  Simvastatin 80 Mg Tabs (Simvastatin) .... 1/2 Once Daily 5)   Xalatan 0.005 %  Soln (Latanoprost) .... As Dir 6)  Omeprazole 20 Mg  Cpdr (Omeprazole) .... One Twice Daily 7)  Flonase 50 Mcg/act Susp (Fluticasone Propionate) .... Use Daily 8)  Hydrocodone-Acetaminophen 5-500 Mg Tabs (Hydrocodone-Acetaminophen) .... One Every 8 Hours For Pain 9)  Advil 200 Mg Tabs (Ibuprofen) .... 2 Every 6 Hrs Prn 10)  Slow Fe 160 (50 Fe) Mg Cr-Tabs (Ferrous Sulfate Dried) .Marland Kitchen.. 1 By Mouth Once Daily  Allergies (verified): 1)  ! Pcn 2)  Amoxicillin (Amoxicillin)  Past History:  Past Medical History: Kidney Stones High Cholesterol Diabetes mellitus, type II GERD Hypertension Hyperlipidemia insomnia Headache bifascicular block Anemia-iron deficiency glaucoma  Past Surgical History: Reviewed history from 03/29/2010 and no changes required. Cataract extraction bilateral Hemorrhoidectomy Hysterectomy-complete with incidental appendectomy, age 72 negative adenosine Cardiolite study June 2008  colonoscopy (2)- last aprrox 2008 (best patient estimate)  Family History: Reviewed history from 03/17/2008 and no changes required. Family History Breast cancer 1st degree relative <50 Family History of Colon CA 1st degree relative <60 Family History Ovarian cancer Family History of Skin cancer Family History of Stroke M 1st degree relative <50 Family History of Cardiovascular disorder Family History of Neurological disorder   father died age 75, brain cancer mother died age 47, CVA  3  brothers, history of MI, status post CABG 4 sisters-positive for ovarian, colon and breast cancer, as well as senile dementia  Social History: Retired Single Never Smoked 3 children widowed Daily Caffeine Use  Review of Systems       The patient complains  of anemia, anxiety-new, headaches-new, and sleeping problems.  The patient denies allergy/sinus, arthritis/joint pain, back pain, blood in urine, breast changes/lumps, change in vision, confusion, cough, coughing up  blood, depression-new, fainting, fatigue, fever, hearing problems, heart murmur, heart rhythm changes, itching, menstrual pain, muscle pains/cramps, night sweats, nosebleeds, pregnancy symptoms, shortness of breath, skin rash, sore throat, swelling of feet/legs, swollen lymph glands, thirst - excessive , urination - excessive , urination changes/pain, urine leakage, vision changes, and voice change.         All other systems were reviewed and were negative   Vital Signs:  Patient profile:   77 year old female Height:      64 inches Weight:      158 pounds BMI:     27.22 BSA:     1.77 Pulse rate:   80 / minute Pulse rhythm:   regular BP sitting:   130 / 80  (left arm)  Vitals Entered By: Merri Ray CMA Duncan Dull) (May 30, 2010 3:29 PM)  Physical Exam  Additional Exam:  She is a well-developed well-nourished female  skin: anicter  HEENT: normocephalic; PEERLA; no nasal or orpharyngeal abnormalities neck: supple nodes: no cervical adenopathy chest: clear cor:  no murmurs, gallops or rubs abd:  bowel sounds normoactive; no abdominal masses, tenderness, organomegaly rectal: no masses; stool heme negative ext: no cyanosis, clubbing, or edema skeletal: no gross skeletal abnormalities neuro: alert, oriented x 3; no focal abnormalities    Impression & Recommendations:  Problem # 1:  ANEMIA-IRON DEFICIENCY (ICD-280.9) She has not had any overt GI bleeding.  Chronic GI blood loss certainly is a concern in spite of her Hemoccult negative stool.  She complains of postprandial upper abdominal pain raising  the question of an upper GI lesion.  By the patient's account she has not had a colonoscopy over 5 years.  Recommendations #1 upper endoscopy #2 colonoscopy if #1 is not diagnostic for source of pain and her anemia Orders: EGD (EGD)  Problem # 2:  DIABETES MELLITUS (ICD-250.00) Assessment: Comment Only  Problem # 3:  LOSS OF WEIGHT (ICD-783.21) This may be related to her  anemia and abdominal pain.   Patient Instructions: 1)  Copy sent to : Marzella Schlein 2)  Your EGD is scheduled for 06/05/2010 at 10am arrive at 9am on the 4th floor 3)  The medication list was reviewed and reconciled.  All changed / newly prescribed medications were explained.  A complete medication list was provided to the patient / caregiver. 4)  Conscious Sedation brochure given.  5)  Upper Endoscopy brochure given.

## 2010-10-02 NOTE — Progress Notes (Signed)
Summary: refill omeprazole  Phone Note Refill Request Message from:  Fax from Pharmacy on July 30, 2010 3:01 PM  Refills Requested: Medication #1:  OMEPRAZOLE 20 MG  CPDR one twice daily   Last Refilled: 05/28/2010 [facility]   Method Requested: Fax to Local Pharmacy Initial call taken by: Duard Brady LPN,  July 30, 2010 3:03 PM    Prescriptions: OMEPRAZOLE 20 MG  CPDR (OMEPRAZOLE) one twice daily  #180 x 6   Entered by:   Duard Brady LPN   Authorized by:   Gordy Savers  MD   Signed by:   Duard Brady LPN on 16/06/9603   Method used:   Faxed to ...       [facility] [address] (retail)       [phone]       [place], [place]  [address]       Ph: [phone]       Fax: [phone]   RxID:   [id number]

## 2010-10-02 NOTE — Procedures (Signed)
Summary: Upper Endoscopy  Patient: [name] Note: All result statuses are Final unless otherwise noted.  Tests: (1) Upper Endoscopy (EGD)   EGD Upper Endoscopy       DONE     [facility]     [address]     [place], [place]  [address]           ENDOSCOPY PROCEDURE REPORT           PATIENT:  [name], [name]  MR#:  [id number]     BIRTHDATE:  12-Jul-1923, 87 yrs. old  GENDER:  female           ENDOSCOPIST:  Barbette Hair. Arlyce Dice, MD     Referred by:  Eleonore Chiquito, M.D.           PROCEDURE DATE:  06/05/2010     PROCEDURE:  EGD, diagnostic     ASA CLASS:  Class II     INDICATIONS:  abdominal pain, iron deficiency anemia           MEDICATIONS:   Fentanyl 25 mcg IV, Versed 2 mg IV, 0.6cc     simethancone 0.6 cc PO     TOPICAL ANESTHETIC:  Exactacain Spray           DESCRIPTION OF PROCEDURE:   After the risks benefits and     alternatives of the procedure were thoroughly explained, informed     consent was obtained.  The LB GIF-H180 [id number] endoscope was     introduced through the mouth and advanced to the third portion of     the duodenum, without limitations.  The instrument was slowly     withdrawn as the mucosa was fully examined.     <<PROCEDUREIMAGES>>           The upper, middle, and distal third of the esophagus were     carefully inspected and no abnormalities were noted. The z-line     was well seen at the GEJ. The endoscope was pushed into the fundus     which was normal including a retroflexed view. The antrum,gastric     body, first and second part of the duodenum were unremarkable (see     image1, image2, image3, image4, image5, image6, and image7).     Retroflexed views revealed no abnormalities.    The scope was then     withdrawn from the patient and the procedure completed.     COMPLICATIONS:  None           ENDOSCOPIC IMPRESSION:     1) Normal EGD     RECOMMENDATIONS:     1) My office will schedule a colonoscopy           REPEAT EXAM:   No           ______________________________     Barbette Hair. Arlyce Dice, MD           CC:           n.     eSIGNED:   Barbette Hair. Kaplan at 06/05/2010 10:48 AM           [name], [id number]  Note: An exclamation mark (!) indicates a result that was not dispersed into the flowsheet. Document Creation Date: 06/05/2010 10:48 AM _______________________________________________________________________  (1) Order result status: Final Collection or observation date-time: 06/05/2010 10:41 Requested date-time:  Receipt date-time:  Reported date-time:  Referring Physician:   Ordering Physician: Melvia Heaps [phone]) Specimen Source:  Source: End[facility]  Dewain Penning Order Number: [id number] Lab site:   Appended Document: Upper Endoscopy Pt was scheduled for a Previsit on 06/20/2010 and a colonoscopy on 10/27 from recovery

## 2010-10-04 NOTE — Assessment & Plan Note (Signed)
Summary: DISCUSS MED/CJR   Vital Signs:  [name] profile:   77 year old female Weight:      157 pounds Temp:     98.4 degrees F oral BP sitting:   110 / 70  (right arm) Cuff size:   regular  Vitals Entered By: Duard Brady LPN (September 19, 2010 11:17 AM) CC: medication f/u  Is [name] Diabetic? Yes Did you bring your meter with you today? No CBG Result 101   Primary Care Provider:  Marzella Schlein  CC:  medication f/u .  History of Present Illness: [name] who is seen today for follow-up.  A phone contact was made earlier describing worsening depression tearfulness, and the [name] was prescribed sertraline 25 mg daily.  She was asked to follow up within the week and has returned today.  She states that after the initial dose.  She felt quite poorly with weakness, dizziness, confusion, and was unable to tolerate.  She states that she has a sister, who takes citalopram and wishes to try this medication.  The [name] has had a difficult holiday with a number of her children apparently not making much of an effort to visit or call.  She feels improved, but still weak with very little interest in daily activities.  She states that she does not have the energy to do household or other activity.  She has a history of type 2 diabetes, which has been stable  Allergies: 1)  ! Pcn 2)  Amoxicillin (Amoxicillin)  Review of Systems       The [name] complains of depression.  The [name] denies anorexia, fever, weight loss, weight gain, vision loss, decreased hearing, hoarseness, chest pain, syncope, dyspnea on exertion, peripheral edema, prolonged cough, headaches, hemoptysis, abdominal pain, melena, hematochezia, severe indigestion/heartburn, hematuria, incontinence, genital sores, muscle weakness, suspicious skin lesions, transient blindness, difficulty walking, unusual weight change, abnormal bleeding, enlarged lymph nodes, angioedema, and breast masses.    Physical  Exam  General:  overweight-appearing.  alert appears well Psych:  Cognition and judgment appear intact. Alert and cooperative with normal attention span and concentration. No apparent delusions, illusions, hallucinations   Impression & Recommendations:  Problem # 1:  DEPRESSIVE DISORDER (ICD-311)  The following medications were removed from the medication list:    Sertraline Hcl 25 Mg Tabs (Sertraline hcl) ..... One by mouth daily Her updated medication list for this problem includes:    Alprazolam 0.25 Mg Tabs (Alprazolam) ..... One tablet twice daily as needed for anxiety    Citalopram Hydrobromide 10 Mg Tabs (Citalopram hydrobromide) ..... One every morning  Problem # 2:  DIABETES MELLITUS (ICD-250.00)  Her updated medication list for this problem includes:    Bayer Childrens Aspirin 81 Mg Chew (Aspirin) .Marland Kitchen... Take 1 tablet by mouth once a day    Metformin Hcl 500 Mg Tb24 (Metformin hcl) .Marland Kitchen... 1 once daily  Orders: Capillary Blood Glucose/CBG ([address])  Complete Medication List: 1)  Bayer Childrens Aspirin 81 Mg Chew (Aspirin) .... Take 1 tablet by mouth once a day 2)  Hydrochlorothiazide 25 Mg Tabs (Hydrochlorothiazide) .Marland Kitchen.. 1 once daily 3)  Metformin Hcl 500 Mg Tb24 (Metformin hcl) .Marland Kitchen.. 1 once daily 4)  Simvastatin 80 Mg Tabs (Simvastatin) .... 1/2 once daily 5)  Xalatan 0.005 % Soln (Latanoprost) .... As dir 6)  Omeprazole 20 Mg Cpdr (Omeprazole) .... One twice daily 7)  Flonase 50 Mcg/act Susp (Fluticasone propionate) .... Use daily 8)  Hydrocodone-acetaminophen 5-500 Mg Tabs (Hydrocodone-acetaminophen) .... One every 8  hours for pain 9)  Advil 200 Mg Tabs (Ibuprofen) .... 2 every 6 hrs prn 10)  Slow Fe 160 (50 Fe) Mg Cr-tabs (Ferrous sulfate dried) .Marland Kitchen.. 1 by mouth once daily 11)  Alprazolam 0.25 Mg Tabs (Alprazolam) .... One tablet twice daily as needed for anxiety 12)  Citalopram Hydrobromide 10 Mg Tabs (Citalopram hydrobromide) .... One every morning  [name]  Instructions: 1)  Please schedule a follow-up appointment in 2 months. 2)  It is important that you exercise regularly at least 20 minutes 5 times a week. If you develop chest pain, have severe difficulty breathing, or feel very tired , stop exercising immediately and seek medical attention. Prescriptions: CITALOPRAM HYDROBROMIDE 10 MG TABS (CITALOPRAM HYDROBROMIDE) one every morning  #50 x 0   Entered and Authorized by:   Gordy Savers  MD   Signed by:   Gordy Savers  MD on 09/19/2010   Method used:   Print then Give to [name]   RxID:   [id number]    Orders Added: 1)  Capillary Blood Glucose/CBG [21308] 2)  Est. [name] Level III [65784]

## 2010-10-04 NOTE — Letter (Signed)
Summary: Eye Exam/[facility]  Eye Exam/ Ophthalmology   Imported By: Maryln Gottron 08/16/2010 09:07:05  _____________________________________________________________________  External Attachment:    Type:   Image     Comment:   External Document

## 2010-10-04 NOTE — Progress Notes (Signed)
  Phone Note Call from Patient Call back at Home Phone [phone]   Caller: Patient Call For: Gordy Savers  MD Summary of Call: Pt is taking Hctz, Simvastatin, Meformin, Omeprazole, and she feels like she can cry all the time, and is light headed, dizzy and depressed. Feels like she is crazy.  Initial call taken by: Lynann Beaver CMA AAMA,  September 13, 2010 3:44 PM  Follow-up for Phone Call        sertraline  25 mg one every am  #50 ROV this week or next Follow-up by: Gordy Savers  MD,  September 13, 2010 5:05 PM    New/Updated Medications: SERTRALINE HCL 25 MG TABS (SERTRALINE HCL) one by mouth daily Prescriptions: SERTRALINE HCL 25 MG TABS (SERTRALINE HCL) one by mouth daily  #60 x 0   Entered by:   Lynann Beaver CMA AAMA   Authorized by:   Gordy Savers  MD   Signed by:   Lynann Beaver CMA AAMA on 09/14/2010   Method used:   Electronically to        [facility] [phone]* (retail)       [address]       [place], [place]  [address]       Ph: [phone] or [phone]       Fax: [phone]   RxID:   [id number]  Notified pt.

## 2010-10-04 NOTE — Assessment & Plan Note (Signed)
Summary: 3 month rov/njr/pt rsc/cjr/pt rescd//ccm   Vital Signs:  Patient profile:   77 year old female Weight:      158 pounds Temp:     98.3 degrees F oral BP sitting:   98 / 60  (right arm) Cuff size:   regular  Vitals Entered By: Duard Brady LPN (August 16, 2010 1:37 PM) CC: 3 mos rov - doing well Is Patient Diabetic? Yes Did you bring your meter with you today? No CBG Result 94   Primary Care Provider:  Marzella Schlein  CC:  3 mos rov - doing well.  History of Present Illness: an 77 year old patient who is in today for follow-up.  Since her last visit here.  She has had a full GI evaluation due to anemia, abdominal pain and weight loss.  Her weight loss has stabilized and her weight is unchanged compared to a visit 3 months ago.  She has hypertension and type 2 diabetes.  She has occasional episodes of brief chest pain, shortness of breath, and anxiety that have occurred always at night.  She wonders if these are not anxiety attacks.  There are some situational stressors.  She does have suspected gastroesophageal reflux disease and is on PPI therapy.  Her upper endoscopy was normal.  Allergies: 1)  ! Pcn 2)  Amoxicillin (Amoxicillin)  Past History:  Past Medical History: Reviewed history from 05/30/2010 and no changes required. Kidney Stones High Cholesterol Diabetes mellitus, type II GERD Hypertension Hyperlipidemia insomnia Headache bifascicular block Anemia-iron deficiency glaucoma  Past Surgical History: Cataract extraction bilateral Hemorrhoidectomy Hysterectomy-complete with incidental appendectomy, age 21 negative adenosine Cardiolite study June 2008  colonoscopy (2)- last aprrox 2008 (best patient estimate), 2011  Social History: Reviewed history from 05/30/2010 and no changes required. Retired Single Never Smoked 3 children widowed Daily Caffeine Use  Review of Systems  The patient denies anorexia, fever, weight loss, weight  gain, vision loss, decreased hearing, hoarseness, chest pain, syncope, dyspnea on exertion, peripheral edema, prolonged cough, headaches, hemoptysis, abdominal pain, melena, hematochezia, severe indigestion/heartburn, hematuria, incontinence, genital sores, muscle weakness, suspicious skin lesions, transient blindness, difficulty walking, depression, unusual weight change, abnormal bleeding, enlarged lymph nodes, angioedema, and breast masses.    Physical Exam  General:  overweight-appearing.  130/80 Head:  Normocephalic and atraumatic without obvious abnormalities. No apparent alopecia or balding. Eyes:  No corneal or conjunctival inflammation noted. EOMI. Perrla. Funduscopic exam benign, without hemorrhages, exudates or papilledema. Vision grossly normal. Mouth:  Oral mucosa and oropharynx without lesions or exudates.  Teeth in good repair. Neck:  No deformities, masses, or tenderness noted. Lungs:  Normal respiratory effort, chest expands symmetrically. Lungs are clear to auscultation, no crackles or wheezes. Heart:  Normal rate and regular rhythm. S1 and S2 normal without gallop, murmur, click, rub or other extra sounds. Abdomen:  Bowel sounds positive,abdomen soft and non-tender without masses, organomegaly or hernias noted. Msk:  No deformity or scoliosis noted of thoracic or lumbar spine.   Extremities:  1+ left pedal edema and 1+ right pedal edema.     Impression & Recommendations:  Problem # 1:  LOSS OF WEIGHT (ICD-783.21) presently stable  Problem # 2:  ANEMIA-IRON DEFICIENCY (ICD-280.9)  Her updated medication list for this problem includes:    Slow Fe 160 (50 Fe) Mg Cr-tabs (Ferrous sulfate dried) .Marland Kitchen... 1 by mouth once daily will recheck a CBC  Orders: Venipuncture (04540) TLB-CBC Platelet - w/Differential (85025-CBCD)  Problem # 3:  DIABETES MELLITUS (ICD-250.00)  Her updated medication  list for this problem includes:    Bayer Childrens Aspirin 81 Mg Chew (Aspirin)  .Marland Kitchen... Take 1 tablet by mouth once a day    Metformin Hcl 500 Mg Tb24 (Metformin hcl) .Marland Kitchen... 1 once daily  Orders: Capillary Blood Glucose/CBG (57846) Specimen Handling (96295) will check a hemoglobin A1C  Problem # 4:  HYPERTENSION (ICD-401.9)  Her updated medication list for this problem includes:    Hydrochlorothiazide 25 Mg Tabs (Hydrochlorothiazide) .Marland Kitchen... 1 once daily  Complete Medication List: 1)  Bayer Childrens Aspirin 81 Mg Chew (Aspirin) .... Take 1 tablet by mouth once a day 2)  Hydrochlorothiazide 25 Mg Tabs (Hydrochlorothiazide) .Marland Kitchen.. 1 once daily 3)  Metformin Hcl 500 Mg Tb24 (Metformin hcl) .Marland Kitchen.. 1 once daily 4)  Simvastatin 80 Mg Tabs (Simvastatin) .... 1/2 once daily 5)  Xalatan 0.005 % Soln (Latanoprost) .... As dir 6)  Omeprazole 20 Mg Cpdr (Omeprazole) .... One twice daily 7)  Flonase 50 Mcg/act Susp (Fluticasone propionate) .... Use daily 8)  Hydrocodone-acetaminophen 5-500 Mg Tabs (Hydrocodone-acetaminophen) .... One every 8 hours for pain 9)  Advil 200 Mg Tabs (Ibuprofen) .... 2 every 6 hrs prn 10)  Slow Fe 160 (50 Fe) Mg Cr-tabs (Ferrous sulfate dried) .Marland Kitchen.. 1 by mouth once daily 11)  Alprazolam 0.25 Mg Tabs (Alprazolam) .... One tablet twice daily as needed for anxiety  Other Orders: TLB-A1C / Hgb A1C (Glycohemoglobin) (83036-A1C)  Patient Instructions: 1)  Please schedule a follow-up appointment in 3 months. 2)  Limit your Sodium (Salt). 3)  It is important that you exercise regularly at least 20 minutes 5 times a week. If you develop chest pain, have severe difficulty breathing, or feel very tired , stop exercising immediately and seek medical attention. 4)  You need to lose weight. Consider a lower calorie diet and regular exercise.  5)  Check your blood sugars regularly. If your readings are usually above : or below 70 you should contact our office. 6)  It is important that your Diabetic A1c level is checked every 3 months. Prescriptions: ALPRAZOLAM 0.25  MG TABS (ALPRAZOLAM) one tablet twice daily as needed for anxiety  #50 x 2   Entered and Authorized by:   Gordy Savers  MD   Signed by:   Gordy Savers  MD on 08/16/2010   Method used:   Print then Give to Patient   RxID:   [id number] FLONASE 50 MCG/ACT SUSP (FLUTICASONE PROPIONATE) use daily  #1 x 6   Entered and Authorized by:   Gordy Savers  MD   Signed by:   Gordy Savers  MD on 08/16/2010   Method used:   Electronically to        [facility] [address] (retail)       [phone]       [place], [place]  [address]       Ph: [phone]       Fax: [phone]   RxID:   [id number] OMEPRAZOLE 20 MG  CPDR (OMEPRAZOLE) one twice daily  #180 x 6   Entered and Authorized by:   Gordy Savers  MD   Signed by:   Gordy Savers  MD on 08/16/2010   Method used:   Electronically to        [facility] [address] (retail)       [phone]       [facility]  [place], [place]  [address]       Ph: [phone]       Fax: [phone]   RxID:   [id number] SIMVASTATIN 80 MG TABS (SIMVASTATIN) 1/2 once daily  #180 Each x 3   Entered and Authorized by:   Gordy Savers  MD   Signed by:   Gordy Savers  MD on 08/16/2010   Method used:   Electronically to        [facility] [address] (retail)       [phone]       [place], [place]  [address]       Ph: [phone]       Fax: [phone]   RxID:   [id number] METFORMIN HCL 500 MG TB24 (METFORMIN HCL) 1 once daily  #90 x 6   Entered and Authorized by:   Gordy Savers  MD   Signed by:   Gordy Savers  MD on 08/16/2010   Method used:   Electronically to        [facility] [address] (retail)       [phone]       [place], [place]  [address]       Ph: [phone]       Fax: [phone]   RxID:   [id number] HYDROCHLOROTHIAZIDE 25  MG TABS (HYDROCHLOROTHIAZIDE) 1 once daily  #90 x 6   Entered and Authorized by:   Gordy Savers  MD   Signed by:   Gordy Savers  MD on 08/16/2010   Method used:   Electronically to        [facility] [address] (retail)       [phone]       [place], [place]  [address]       Ph: [phone]       Fax: [phone]   RxID:   [id number]    Orders Added: 1)  Capillary Blood Glucose/CBG [82948] 2)  Est. Patient Level IV [02585] 3)  Venipuncture [27782] 4)  TLB-CBC Platelet - w/Differential [85025-CBCD] 5)  TLB-A1C / Hgb A1C (Glycohemoglobin) [83036-A1C] 6)  Specimen Handling [99000]

## 2010-10-05 NOTE — Procedures (Signed)
Summary: Instruction for procedure/[facility]  Instruction for procedure/[facility]   Imported By: Sherian Rein 06/04/2010 07:42:10  _____________________________________________________________________  External Attachment:    Type:   Image     Comment:   External Document

## 2010-11-14 ENCOUNTER — Encounter: Payer: Self-pay | Admitting: Internal Medicine

## 2010-11-14 LAB — GLUCOSE, CAPILLARY: Glucose-Capillary: 109 mg/dL — ABNORMAL HIGH (ref 70–99)

## 2010-11-15 ENCOUNTER — Encounter: Payer: Self-pay | Admitting: Internal Medicine

## 2010-11-15 ENCOUNTER — Ambulatory Visit (INDEPENDENT_AMBULATORY_CARE_PROVIDER_SITE_OTHER): Payer: Medicare Other | Admitting: Internal Medicine

## 2010-11-15 DIAGNOSIS — E119 Type 2 diabetes mellitus without complications: Secondary | ICD-10-CM

## 2010-11-15 DIAGNOSIS — E785 Hyperlipidemia, unspecified: Secondary | ICD-10-CM

## 2010-11-15 DIAGNOSIS — I10 Essential (primary) hypertension: Secondary | ICD-10-CM

## 2010-11-15 LAB — GLUCOSE, POCT (MANUAL RESULT ENTRY): POC Glucose: 107

## 2010-11-15 LAB — GLUCOSE, CAPILLARY
Glucose-Capillary: 102 mg/dL — ABNORMAL HIGH (ref 70–99)
Glucose-Capillary: 117 mg/dL — ABNORMAL HIGH (ref 70–99)

## 2010-11-15 NOTE — Progress Notes (Signed)
  Subjective:    Patient ID: [name], female    DOB: Apr 23, 1923, 77 y.o.   MRN: [id number]  HPI   77 year old patient who is seen today for followup of her type 2 diabetes. This has been well-controlled on metformin therapy alone. She has dyslipidemia controlled on simvastatin she is on a high 80 mg dose which he continues to tolerate well. She has treated hypertension controlled on diuretic therapy. Her only complaint is frequent dizziness usually in the morning and associated with postural change on arrival today blood pressure was 122/70 she denies any cardiopulmonary complaints. She does have a history of gastroesophageal reflux disease which has been stable and  well controlled on omeprazole    Review of Systems  Constitutional: Negative.   HENT: Negative for hearing loss, congestion, sore throat, rhinorrhea, dental problem, sinus pressure and tinnitus.   Eyes: Negative for pain, discharge and visual disturbance.  Respiratory: Negative for cough and shortness of breath.   Cardiovascular: Negative for chest pain, palpitations and leg swelling.  Gastrointestinal: Negative for nausea, vomiting, abdominal pain, diarrhea, constipation, blood in stool and abdominal distention.  Genitourinary: Negative for dysuria, urgency, frequency, hematuria, flank pain, vaginal bleeding, vaginal discharge, difficulty urinating, vaginal pain and pelvic pain.  Musculoskeletal: Negative for joint swelling, arthralgias and gait problem.  Skin: Negative for rash.  Neurological: Positive for light-headedness. Negative for dizziness, syncope, speech difficulty, weakness, numbness and headaches.  Hematological: Negative for adenopathy.  Psychiatric/Behavioral: Negative for behavioral problems, dysphoric mood and agitation. The patient is not nervous/anxious.        Objective:   Physical Exam  Constitutional: She is oriented to person, place, and time. She appears well-developed and well-nourished.   Blood pressure 110/70  HENT:  Head: Normocephalic.  Right Ear: External ear normal.  Left Ear: External ear normal.  Mouth/Throat: Oropharynx is clear and moist.  Eyes: Conjunctivae and EOM are normal. Pupils are equal, round, and reactive to light.  Neck: Normal range of motion. Neck supple. No thyromegaly present.  Cardiovascular: Normal rate, regular rhythm, normal heart sounds and intact distal pulses.   Pulmonary/Chest: Effort normal and breath sounds normal.  Abdominal: Soft. Bowel sounds are normal. She exhibits no mass. There is no tenderness.  Musculoskeletal: Normal range of motion.  Lymphadenopathy:    She has no cervical adenopathy.  Neurological: She is alert and oriented to person, place, and time.  Skin: Skin is warm and dry. No rash noted.  Psychiatric: She has a normal mood and affect. Her behavior is normal.          Assessment & Plan:   hypertension well controlled. Patient may in fact be having some orthostatic hypotension. We'll discontinue hydrochlorothiazide and  observe off this medication  diabetes mellitus type 2. Stable we'll check a hemoglobin A1c  Dyslipidemia. Will continue simvastatin 80 mg daily

## 2010-11-15 NOTE — Patient Instructions (Signed)
Please check your hemoglobin A1c every 3 months  Please check your blood pressure on a regular basis.  If it is consistently greater than 150/90, please make an office appointment.    It is important that you exercise regularly, at least 20 minutes 3 to 4 times per week.  If you develop chest pain or shortness of breath seek  medical attention.   

## 2010-12-01 ENCOUNTER — Other Ambulatory Visit: Payer: Self-pay | Admitting: Internal Medicine

## 2011-01-07 ENCOUNTER — Other Ambulatory Visit: Payer: Self-pay

## 2011-01-07 MED ORDER — ALPRAZOLAM 0.25 MG PO TABS: 0.2500 mg | ORAL_TABLET | Freq: Two times a day (BID) | ORAL | Status: DC | PRN

## 2011-01-07 NOTE — Telephone Encounter (Signed)
[facility] 

## 2011-01-08 ENCOUNTER — Emergency Department (HOSPITAL_COMMUNITY)
Admission: EM | Admit: 2011-01-08 | Discharge: 2011-01-08 | Disposition: A | Discharge status: Home / Self Care | Payer: Medicare Other | Attending: Emergency Medicine | Admitting: Emergency Medicine

## 2011-01-08 DIAGNOSIS — E119 Type 2 diabetes mellitus without complications: Secondary | ICD-10-CM | POA: Insufficient documentation

## 2011-01-08 DIAGNOSIS — K5900 Constipation, unspecified: Secondary | ICD-10-CM | POA: Insufficient documentation

## 2011-01-08 DIAGNOSIS — E785 Hyperlipidemia, unspecified: Secondary | ICD-10-CM | POA: Insufficient documentation

## 2011-01-09 ENCOUNTER — Telehealth: Payer: Self-pay | Admitting: Internal Medicine

## 2011-01-09 NOTE — Telephone Encounter (Signed)
Opened in error

## 2011-01-15 NOTE — H&P (Signed)
[name], [name] NO.:  [id number]   MEDICAL RECORD NO.:  [id number]          PATIENT TYPE:  INP   LOCATION:  3711                         FACILITY:  [facility]   PHYSICIAN:  Gordy Savers, MDDATE OF BIRTH:  August 30, 1923   DATE OF ADMISSION:  01/29/2007  DATE OF DISCHARGE:                              HISTORY & PHYSICAL   CHIEF COMPLAINT:  Chest pain.   HISTORY OF PRESENT ILLNESS:  The patient is an 77 year old white female  with multiple cardiovascular risk factors and also a history of  gastroesophageal reflux disease.  She was stable until approximately  three days ago when she first had the onset of burning substernal chest  pain.  She was self-treated with antacids and proton pump inhibitors but  over the past three days pain has worsened.  She describes severe pain  throughout the night last night interfering with her sleep.  She awoke  this morning, took some aspirin and finally has had some benefit.  At  the present time, she is complaining only of some mild discomfort.  She  has had a recent URI with some associated cough earlier this month but  these symptoms have largely resolved.  She states that this pain is  unlike any indigestion type pain she has experienced in the past.  She  denied any associated diaphoresis or nausea.  The patient was initially  evaluated in the office.  Electrocardiogram revealed a sinus rhythm with  frequent multifocal PVCs.  She had a left anterior fascicular block as  well as a right bundle branch block which were unchanged.  There is no  definite acute changes and other than the PVCs, no change from prior  tracings.  The patient is now admitted for further evaluation of her  chest pain and to exclude an acute coronary syndrome.   PAST MEDICAL HISTORY:  1. The patient has a long history of type 2 diabetes.  2. Hypertension.  3. Hypercholesterolemia.  4. Gastroesophageal reflux disease.  5. She has history of kidney  stones.   SURGICAL PROCEDURES:  1. A remote D&C.  2. Hysterectomy for fibroids.  3. She has had hemorrhoid surgery.  4. Bilateral cataract surgery.   She was hospitalized in 1978 for psychiatric admission.   REVIEW OF SYSTEMS:  Otherwise negative.  There is no prior history of  cardiac disease.   FAMILY HISTORY:  Father died at 96 of a brain tumor.  Mother died of a  stroke at 66.  Three brothers, one with a history of MI, status post  CABG.  Four sisters, positive for breast, ovarian and colon cancer.   PHYSICAL EXAMINATION:  GENERAL APPEARANCE:  Well-developed, mildly  overweight white female in no acute distress.  VITAL SIGNS:  Blood pressure is 140/78.  SKIN:  Negative.  HEENT:  Normal pupil responses.  Conjunctiva clear.  Oropharynx benign.  Upper dentures noted.  NECK:  No bruits or adenopathy.  CHEST:  Clear.  BREASTS:  Negative.  CARDIOVASCULAR:  Normal S1 and S2, frequent ectopics were noted.  No  murmur.  ABDOMEN:  Soft, nontender.  No  epigastric discomfort.  Bowel sounds were  normal.  EXTREMITIES:  Revealed +1 to +2 ankle edema.  Dorsalis pedis pulses were  faint.  Posterior tibial pulses were nonpalpable.  NEUROLOGIC:  Negative.   IMPRESSION:  1. Atypical chest pain in a patient with multiple cardiac risk      factors.  2. Diabetes.  3. Hypertension.  4. Hypercholesterolemia.  5. History of gastroesophageal reflux disease.   DISPOSITION:  The patient will be admitted to a telemetry setting.  Serial cardiac markers will be obtained.  She will be placed on low-dose  beta blocker therapy, prophylactic subcutaneous Lovenox.  She will be  maintained on p.o. proton pump inhibition, a D-dimer and chest x-ray  will also be reviewed.      Gordy Savers, MD  Electronically Signed     PFK/MEDQ  D:  01/29/2007  T:  01/29/2007  Job:  [id number]

## 2011-01-15 NOTE — Discharge Summary (Signed)
[name], [name]              ACCOUNT NO.:  [id number]   MEDICAL RECORD NO.:  [id number]          PATIENT TYPE:  INP   LOCATION:  3711                         FACILITY:  [facility]   PHYSICIAN:  Valerie A. Felicity Coyer, MDDATE OF BIRTH:  11/24/1922   DATE OF ADMISSION:  01/29/2007  DATE OF DISCHARGE:  01/30/2007                               DISCHARGE SUMMARY   DISCHARGE DIAGNOSIS:  1. Atypical chest pain likely secondary to reflux.  2. Diabetes type 2.  3. Urinary tract infection.  4. Hypertension.  5. Hypercholesterolemia.  6. Mild hypokalemia.   HISTORY OF PRESENT ILLNESS:  [name] is an 77 year old female with  multiple cardiac risk factors as well as a history of GERD, who  presented to the office after a 3-day history of worsening burning  substernal chest pain.  She was admitted for further evaluation and  treatment.   PAST MEDICAL HISTORY:  1. Hypertension.  2. Diabetes type 2.  3. Hypercholesterolemia.  4. Fascicular block.  5. GERD.  6. Kidney stones.   COURSE OF HOSPITALIZATION:  1. Atypical chest pain:  The patient was admitted and underwent serial      cardiac enzymes.  Cardiac enzymes were negative x2 at the time of      this dictation.  Third set is currently pending.  She did have one      episode of chest pain during this admission, which she noted as a      substernal burning, which resolved with the addition of Mylanta and      sitting upright.  It appears that she had been taking her Prevacid      on a p.r.n. basis, and she will be instructed to take this on a      daily basis at the time of discharge.  However, due to the      patient's multiple cardiac risk factors, including diabetes,      hypertension, and hypercholesterolemia, we have asked [facility] to arrange an outpatient stress test prior to discharge.  2. Urinary tract infection:  The patient was noted to have a very mild      leukocytosis with a white count of 12,000 and  urinalysis noting      large leukocytes and 21-50 white blood cells.  She will be given a      prescription for Cipro at the time of discharge, and we will send      culture prior to discharge.  She will need followup of final      cultures at her appointment with Dr. Eleonore Chiquito.   DISPOSITION:  The patient will be discharged to home.   MEDICATIONS AT THE TIME OF DISCHARGE:  1. Aspirin 81 mg p.o. daily.  2. Hydrochlorothiazide 25 mg p.o. daily.  3. Prevacid 30 mg p.o. daily.  4. Simvastatin 40 mg p.o. daily.  5. Metformin ER 500 mg p.o. daily.  6. Cipro 500 mg p.o. b.i.d. for 7 days.  7. Xalantan eye drops as before.   PERTINENT LABORATORY AT THE TIME OF DISCHARGE:  Hemoglobin 12,  hematocrit 36.7, BUN 10, creatinine 0.9, potassium 3.4 prior to  repletion.   FOLLOWUP:  The patient is instructed to follow up with Dr. Eleonore Chiquito on Friday, June 6, at 10:30 a.m.  She will need to follow up  her final urine cultures also at that time.  In addition, [facility] will arrange for outpatient stress testing prior to  discharge.      Sandford Craze, NP      Raenette Rover. Felicity Coyer, MD  Electronically Signed    MO/MEDQ  D:  01/30/2007  T:  01/30/2007  Job:  [id number]   cc:   Gordy Savers, MD

## 2011-01-18 NOTE — Assessment & Plan Note (Signed)
[facility] OFFICE NOTE   [name], [name]                     MRN:          [id number]  DATE:04/02/2006                            DOB:          06-29-1923   An 77 year old female seen today for an extensive evaluation.  She has  multiple medical problems including diabetes, hypertension,  hypercholesterolemia.  She has seasonal allergic rhinitis as well as  gastroesophageal reflux disease.  She is doing well today although  intolerant of 40 mg of simvastatin.  She is doing well on 20 mg daily.   PAST MEDICAL HISTORY:  She has had a prior hysterectomy, hemorrhoidectomy.  She has seen Dr. Etta Grandchild in the past for kidney stones.  She has had cataract  extraction surgery and is followed quarterly by ophthalmology.   REVIEW OF SYSTEMS:  Noncontributory.  She has declined colonoscopies in the  past.  She has had no GI screening in a number of years.  EKG has revealed a  left bundle and left axis deviation; this has been unchanged.  She has had  nice glycemic control.  Last hemoglobin A1c was 6.1.   FAMILY HISTORY:  Unchanged.  Positive for cardiac disease, breast and  ovarian cancer, as well as colon cancer.   EXAMINATION:  GENERAL:  Revealed a mildly overweight white female in no  acute distress.  VITAL SIGNS:  Blood pressure was 140/70 in the right arm, slightly higher on  the right.  HEENT:  Fundi, ear, nose, and throat clear.  Upper dentures in place.  NECK:  No bruits or adenopathy.  CHEST:  Clear.  BREASTS:  Negative.  CARDIOVASCULAR:  Normal heart sounds, no murmurs.  ABDOMEN:  Benign, no organomegaly.  PELVIC:  Revealed absent uterus, no adnexal masses.  Stool heme negative, no  rectal masses.  EXTREMITIES:  Revealed trace edema.  Dorsalis pedis pulses were intact.  NEUROLOGIC:  Negative, intact to monofilament testing.   IMPRESSION:  1.  Diabetes.  2.  Hypertension.  3.   Hypercholesterolemia.   DISPOSITION:  Medical regimen unchanged.  A total cholesterol and hemoglobin  A1c will be reviewed.  Laboratory studies were done in January of this year.  Reassess in 3 months.                                   Gordy Savers, MD   PFK/MedQ  DD:  04/02/2006  DT:  04/02/2006  Job #:  [id number]

## 2011-01-23 ENCOUNTER — Emergency Department (HOSPITAL_COMMUNITY)
Admission: EM | Admit: 2011-01-23 | Discharge: 2011-01-23 | Disposition: A | Discharge status: Home / Self Care | Payer: Medicare Other | Attending: Emergency Medicine | Admitting: Emergency Medicine

## 2011-01-23 ENCOUNTER — Telehealth: Payer: Self-pay | Admitting: *Deleted

## 2011-01-23 DIAGNOSIS — E119 Type 2 diabetes mellitus without complications: Secondary | ICD-10-CM | POA: Insufficient documentation

## 2011-01-23 DIAGNOSIS — R5383 Other fatigue: Secondary | ICD-10-CM | POA: Insufficient documentation

## 2011-01-23 DIAGNOSIS — R6883 Chills (without fever): Secondary | ICD-10-CM | POA: Insufficient documentation

## 2011-01-23 DIAGNOSIS — R5381 Other malaise: Secondary | ICD-10-CM | POA: Insufficient documentation

## 2011-01-23 DIAGNOSIS — R110 Nausea: Secondary | ICD-10-CM | POA: Insufficient documentation

## 2011-01-23 DIAGNOSIS — I10 Essential (primary) hypertension: Secondary | ICD-10-CM | POA: Insufficient documentation

## 2011-01-23 LAB — URINALYSIS, ROUTINE W REFLEX MICROSCOPIC
Glucose, UA: NEGATIVE mg/dL
Hgb urine dipstick: NEGATIVE
Protein, ur: NEGATIVE mg/dL
Specific Gravity, Urine: 1.006 (ref 1.005–1.030)
Urobilinogen, UA: 0.2 mg/dL (ref 0.0–1.0)

## 2011-01-23 LAB — DIFFERENTIAL
Basophils Absolute: 0 10*3/uL (ref 0.0–0.1)
Basophils Relative: 0 % (ref 0–1)
Eosinophils Absolute: 0.1 10*3/uL (ref 0.0–0.7)
Eosinophils Relative: 1 % (ref 0–5)
Lymphocytes Relative: 19 % (ref 12–46)
Lymphs Abs: 2.2 10*3/uL (ref 0.7–4.0)
Monocytes Absolute: 0.4 10*3/uL (ref 0.1–1.0)
Monocytes Relative: 4 % (ref 3–12)
Neutro Abs: 8.7 10*3/uL — ABNORMAL HIGH (ref 1.7–7.7)
Neutrophils Relative %: 76 % (ref 43–77)

## 2011-01-23 LAB — BASIC METABOLIC PANEL
CO2: 26 mEq/L (ref 19–32)
Calcium: 9.7 mg/dL (ref 8.4–10.5)
Chloride: 101 mEq/L (ref 96–112)
Creatinine, Ser: 0.78 mg/dL (ref 0.4–1.2)
GFR calc Af Amer: >60 mL/min (ref >60)
Sodium: 141 mEq/L (ref 135–145)

## 2011-01-23 LAB — CBC
HCT: 35.3 % — ABNORMAL LOW (ref 36.0–46.0)
MCH: 27.6 pg (ref 26.0–34.0)
MCHC: 32 g/dL (ref 30.0–36.0)
RDW: 12.8 % (ref 11.5–15.5)

## 2011-01-23 LAB — GLUCOSE, CAPILLARY: Glucose-Capillary: 104 mg/dL — ABNORMAL HIGH (ref 70–99)

## 2011-01-23 NOTE — Telephone Encounter (Signed)
Pt is chilled, and BP is running high.  No other complaints.  Son is very concerned about her and the way she looks.  Advised ER.

## 2011-01-24 ENCOUNTER — Ambulatory Visit (INDEPENDENT_AMBULATORY_CARE_PROVIDER_SITE_OTHER): Payer: Medicare Other | Admitting: Internal Medicine

## 2011-01-24 ENCOUNTER — Encounter: Payer: Self-pay | Admitting: Internal Medicine

## 2011-01-24 VITALS — BP 146/70 | HR 90 | Temp 97.4°F | Resp 20 | Wt 157.0 lb

## 2011-01-24 DIAGNOSIS — J069 Acute upper respiratory infection, unspecified: Secondary | ICD-10-CM

## 2011-01-24 NOTE — Patient Instructions (Signed)
Drink as much fluid as you  can tolerate over the next few days  Tylenol 1-2 tabs po q4h prn  STOP HYDROCHLOROTHIAZIDE  Return in one month for follow-up

## 2011-01-24 NOTE — Progress Notes (Signed)
  Subjective:    Patient ID: [name], female    DOB: 03-08-1923, 77 y.o.   MRN: [id number]  HPI  77 year old patient who is fairly stable until yesterday when she developed malaise and episodes of chills. Due to her general sense of unwellness she was seen at the emergency department where examination was fairly noncontributory laboratory screen revealed a very slight WBC elevation otherwise unremarkable. She has been on diuretic therapy due to hypertension. Today she feels somewhat improved but still weak;  there's been no fever or cough   Review of Systems  Constitutional: Positive for chills, appetite change and fatigue. Negative for fever.  HENT: Negative for hearing loss, congestion, sore throat, rhinorrhea, dental problem, sinus pressure and tinnitus.   Eyes: Negative for pain, discharge and visual disturbance.  Respiratory: Negative for cough and shortness of breath.   Cardiovascular: Negative for chest pain, palpitations and leg swelling.  Gastrointestinal: Negative for nausea, vomiting, abdominal pain, diarrhea, constipation, blood in stool and abdominal distention.  Genitourinary: Negative for dysuria, urgency, frequency, hematuria, flank pain, vaginal bleeding, vaginal discharge, difficulty urinating, vaginal pain and pelvic pain.  Musculoskeletal: Negative for joint swelling, arthralgias and gait problem.  Skin: Negative for rash.  Neurological: Positive for weakness. Negative for dizziness, syncope, speech difficulty, numbness and headaches.  Hematological: Negative for adenopathy.  Psychiatric/Behavioral: Negative for behavioral problems, dysphoric mood and agitation. The patient is not nervous/anxious.        Objective:   Physical Exam  Constitutional: She is oriented to person, place, and time. She appears well-developed and well-nourished. No distress.       Blood pressure 130/78  HENT:  Head: Normocephalic.  Right Ear: External ear normal.  Left Ear: External  ear normal.  Mouth/Throat: Oropharynx is clear and moist.  Eyes: Conjunctivae and EOM are normal. Pupils are equal, round, and reactive to light.  Neck: Normal range of motion. Neck supple. No thyromegaly present.  Cardiovascular: Normal rate, regular rhythm, normal heart sounds and intact distal pulses.   Pulmonary/Chest: Effort normal and breath sounds normal.  Abdominal: Soft. Bowel sounds are normal. She exhibits no mass. There is no tenderness.  Musculoskeletal: Normal range of motion.  Lymphadenopathy:    She has no cervical adenopathy.  Neurological: She is alert and oriented to person, place, and time.  Skin: Skin is warm and dry. No rash noted.  Psychiatric: She has a normal mood and affect. Her behavior is normal.          Assessment & Plan:   Probable viral syndrome. Have asked patient to a force fluids take Tylenol. We'll also discontinue diuretic therapy in view of her marginal oral intake. We'll reassess her blood pressure in 4 weeks

## 2011-01-24 NOTE — Telephone Encounter (Signed)
Pts son called back and has sch his mom an ov for today at 2:30pm with Dr Amador Cunas.

## 2011-01-24 NOTE — Telephone Encounter (Signed)
ROV this PM  OK

## 2011-02-07 ENCOUNTER — Telehealth: Payer: Self-pay | Admitting: *Deleted

## 2011-02-07 ENCOUNTER — Emergency Department (HOSPITAL_COMMUNITY)
Admission: EM | Admit: 2011-02-07 | Discharge: 2011-02-07 | Disposition: A | Discharge status: Home / Self Care | Payer: Medicare Other | Source: Home / Self Care | Attending: Emergency Medicine | Admitting: Emergency Medicine

## 2011-02-07 NOTE — Telephone Encounter (Signed)
LM for pt to call back.  Someone called earlier to speak to Dr. Charm Rings nurse.

## 2011-02-08 ENCOUNTER — Ambulatory Visit (INDEPENDENT_AMBULATORY_CARE_PROVIDER_SITE_OTHER): Payer: Medicare Other | Admitting: Internal Medicine

## 2011-02-08 ENCOUNTER — Emergency Department (INDEPENDENT_AMBULATORY_CARE_PROVIDER_SITE_OTHER)
Admit: 2011-02-08 | Discharge: 2011-02-08 | Disposition: A | Discharge status: Home / Self Care | Payer: Medicare Other | Attending: Internal Medicine | Admitting: Internal Medicine

## 2011-02-08 ENCOUNTER — Encounter: Payer: Self-pay | Admitting: Internal Medicine

## 2011-02-08 ENCOUNTER — Telehealth: Payer: Self-pay | Admitting: *Deleted

## 2011-02-08 VITALS — BP 130/64 | Temp 98.2°F | Wt 158.0 lb

## 2011-02-08 DIAGNOSIS — E119 Type 2 diabetes mellitus without complications: Secondary | ICD-10-CM

## 2011-02-08 DIAGNOSIS — R0602 Shortness of breath: Secondary | ICD-10-CM

## 2011-02-08 DIAGNOSIS — I10 Essential (primary) hypertension: Secondary | ICD-10-CM

## 2011-02-08 DIAGNOSIS — R609 Edema, unspecified: Secondary | ICD-10-CM

## 2011-02-08 DIAGNOSIS — E785 Hyperlipidemia, unspecified: Secondary | ICD-10-CM

## 2011-02-08 LAB — CBC WITH DIFFERENTIAL/PLATELET
Basophils Relative: 0.2 % (ref 0.0–3.0)
Eosinophils Relative: 0.4 % (ref 0.0–5.0)
Lymphocytes Relative: 16.8 % (ref 12.0–46.0)
Monocytes Absolute: 0.5 10*3/uL (ref 0.1–1.0)
Monocytes Relative: 4.2 % (ref 3.0–12.0)
Neutrophils Relative %: 78.4 % — ABNORMAL HIGH (ref 43.0–77.0)
Platelets: 232 10*3/uL (ref 150.0–400.0)
RBC: 4.23 Mil/uL (ref 3.87–5.11)
WBC: 10.8 10*3/uL — ABNORMAL HIGH (ref 4.5–10.5)

## 2011-02-08 LAB — HEPATIC FUNCTION PANEL
ALT: 9 U/L (ref 0–35)
AST: 22 U/L (ref 0–37)
Alkaline Phosphatase: 41 U/L (ref 39–117)
Bilirubin, Direct: 0.2 mg/dL (ref 0.0–0.3)
Total Bilirubin: 1 mg/dL (ref 0.3–1.2)

## 2011-02-08 LAB — POCT CBG (FASTING - GLUCOSE)-MANUAL ENTRY: Glucose Fasting, POC: 112 mg/dL

## 2011-02-08 LAB — BASIC METABOLIC PANEL
Calcium: 9.7 mg/dL (ref 8.4–10.5)
Creatinine, Ser: 0.7 mg/dL (ref 0.4–1.2)
Sodium: 144 mEq/L (ref 135–145)

## 2011-02-08 LAB — SEDIMENTATION RATE: Sed Rate: 18 mm/hr (ref 0–22)

## 2011-02-08 LAB — BRAIN NATRIURETIC PEPTIDE: Pro B Natriuretic peptide (BNP): 151 pg/mL — ABNORMAL HIGH (ref 0.0–100.0)

## 2011-02-08 MED ORDER — FUROSEMIDE 20 MG PO TABS: 20.0000 mg | ORAL_TABLET | Freq: Every day | ORAL | Status: DC

## 2011-02-08 MED ORDER — HYDROCODONE-ACETAMINOPHEN 5-500 MG PO TABS: 1.0000 | ORAL_TABLET | Freq: Four times a day (QID) | ORAL | Status: AC | PRN

## 2011-02-08 NOTE — Progress Notes (Signed)
  Subjective:    Patient ID: [name], female    DOB: November 10, 1922, 77 y.o.   MRN: [id number]  HPI  77 year old patient who is seen today for followup. She was seen approximately 3 weeks ago with a suspected viral illness. She remains weak with headache cold intolerance although there is no documented fever. Today she had some diarrhea. She really has felt unwell for 5-6 weeks. She was seen in the ED approximately one month ago her laboratory studies were fairly unremarkable. White count was very mildly elevated. Denies any cough or shortness of breath.    Review of Systems  Constitutional: Positive for fatigue.  HENT: Negative for hearing loss, congestion, sore throat, rhinorrhea, dental problem, sinus pressure and tinnitus.   Eyes: Negative for pain, discharge and visual disturbance.  Respiratory: Negative for cough and shortness of breath.   Cardiovascular: Positive for leg swelling. Negative for chest pain and palpitations.  Gastrointestinal: Negative for nausea, vomiting, abdominal pain, diarrhea, constipation, blood in stool and abdominal distention.  Genitourinary: Negative for dysuria, urgency, frequency, hematuria, flank pain, vaginal bleeding, vaginal discharge, difficulty urinating, vaginal pain and pelvic pain.  Musculoskeletal: Negative for joint swelling, arthralgias and gait problem.  Skin: Negative for rash.  Neurological: Positive for weakness. Negative for dizziness, syncope, speech difficulty, numbness and headaches.  Hematological: Negative for adenopathy.  Psychiatric/Behavioral: Negative for behavioral problems, dysphoric mood and agitation. The patient is not nervous/anxious.        Objective:   Physical Exam  Constitutional: She is oriented to person, place, and time. She appears well-developed and well-nourished. No distress.       Appears weak and mildly short of breath with exertion Fasting blood sugar 112 Blood pressure 130/64 O2 saturation 99% Pulse  rate 64  HENT:  Head: Normocephalic.  Right Ear: External ear normal.  Left Ear: External ear normal.  Mouth/Throat: Oropharynx is clear and moist.  Eyes: Conjunctivae and EOM are normal. Pupils are equal, round, and reactive to light.  Neck: Normal range of motion. Neck supple. No JVD present. No thyromegaly present.  Cardiovascular: Normal rate, regular rhythm, normal heart sounds and intact distal pulses.   No murmur heard. Pulmonary/Chest: Effort normal and breath sounds normal.  Abdominal: Soft. Bowel sounds are normal. She exhibits no mass. There is no tenderness.  Musculoskeletal: Normal range of motion. She exhibits edema.       +2 ankle edema  Lymphadenopathy:    She has no cervical adenopathy.  Neurological: She is alert and oriented to person, place, and time.  Skin: Skin is warm and dry. No rash noted.  Psychiatric: She has a normal mood and affect. Her behavior is normal.          Assessment & Plan:   Fatigue cold intolerance. We'll check some updated lab and a chest x-ray. We'll check a thyroid level and BNP. We'll also check a sedimentation rate. We'll report to the ED for evaluation over the weekend if any clinical worsening  Diabetes Hypertension Dyslipidemia

## 2011-02-08 NOTE — Telephone Encounter (Signed)
Generic Vicodin #5/500  #50  one every 6 hours as needed for pain Generic Lasix 20 mg one daily as needed for swelling #50

## 2011-02-08 NOTE — Telephone Encounter (Signed)
Spoke with [name] because pt was resting.  He is aware that rx have been called in to the pharmacy.

## 2011-02-08 NOTE — Telephone Encounter (Signed)
Pts son called to req status of med refills. Needing to pick up at pharmacy before 3:30pm today.

## 2011-02-08 NOTE — Telephone Encounter (Signed)
Son is asking for lab results, and RX for pt's swelling and painful legs.  [facility]).

## 2011-02-08 NOTE — Patient Instructions (Signed)
Limit your sodium (Salt) intake  Chest x-ray as discussed  Report to the emergency department for evaluation  If  symptoms worsen over the weekend

## 2011-02-09 ENCOUNTER — Emergency Department (HOSPITAL_COMMUNITY): Payer: Medicare Other

## 2011-02-09 ENCOUNTER — Observation Stay (HOSPITAL_COMMUNITY)
Admission: EM | Admit: 2011-02-09 | Discharge: 2011-02-10 | DRG: 392 | Disposition: A | Discharge status: Home / Self Care | Payer: Medicare Other | Attending: Internal Medicine | Admitting: Internal Medicine

## 2011-02-09 DIAGNOSIS — N390 Urinary tract infection, site not specified: Secondary | ICD-10-CM | POA: Insufficient documentation

## 2011-02-09 DIAGNOSIS — F411 Generalized anxiety disorder: Secondary | ICD-10-CM | POA: Insufficient documentation

## 2011-02-09 DIAGNOSIS — R0789 Other chest pain: Principal | ICD-10-CM | POA: Insufficient documentation

## 2011-02-09 DIAGNOSIS — I10 Essential (primary) hypertension: Secondary | ICD-10-CM | POA: Insufficient documentation

## 2011-02-09 DIAGNOSIS — E119 Type 2 diabetes mellitus without complications: Secondary | ICD-10-CM | POA: Insufficient documentation

## 2011-02-09 DIAGNOSIS — E785 Hyperlipidemia, unspecified: Secondary | ICD-10-CM | POA: Insufficient documentation

## 2011-02-09 LAB — CARDIAC PANEL(CRET KIN+CKTOT+MB+TROPI)
CK, MB: 2.2 ng/mL (ref 0.3–4.0)
CK, MB: 2.4 ng/mL (ref 0.3–4.0)
Relative Index: INVALID (ref 0.0–2.5)
Relative Index: INVALID (ref 0.0–2.5)
Total CK: 70 U/L (ref 7–177)
Total CK: 77 U/L (ref 7–177)

## 2011-02-09 LAB — CK TOTAL AND CKMB (NOT AT ARMC): Total CK: 61 U/L (ref 7–177)

## 2011-02-09 LAB — COMPREHENSIVE METABOLIC PANEL
ALT: 11 U/L (ref 0–35)
AST: 14 U/L (ref 0–37)
CO2: 22 mEq/L (ref 19–32)
Calcium: 8.8 mg/dL (ref 8.4–10.5)
GFR calc non Af Amer: >60 mL/min (ref >60)
Potassium: 3.2 mEq/L — ABNORMAL LOW (ref 3.5–5.1)
Sodium: 140 mEq/L (ref 135–145)
Total Protein: 6.5 g/dL (ref 6.0–8.3)

## 2011-02-09 LAB — URINALYSIS, ROUTINE W REFLEX MICROSCOPIC
Bilirubin Urine: NEGATIVE
Hgb urine dipstick: NEGATIVE
Specific Gravity, Urine: 1.023 (ref 1.005–1.030)
pH: 6.5 (ref 5.0–8.0)

## 2011-02-09 LAB — URINE MICROSCOPIC-ADD ON

## 2011-02-09 LAB — CBC
HCT: 32.8 % — ABNORMAL LOW (ref 36.0–46.0)
Hemoglobin: 10.8 g/dL — ABNORMAL LOW (ref 12.0–15.0)
MCH: 28.1 pg (ref 26.0–34.0)
RBC: 3.85 MIL/uL — ABNORMAL LOW (ref 3.87–5.11)

## 2011-02-09 LAB — PROTIME-INR: Prothrombin Time: 13.5 seconds (ref 11.6–15.2)

## 2011-02-09 LAB — TROPONIN I: Troponin I: <0.3 ng/mL (ref <0.30)

## 2011-02-10 LAB — CBC
MCV: 86.4 fL (ref 78.0–100.0)
Platelets: 224 10*3/uL (ref 150–400)
RBC: 3.96 MIL/uL (ref 3.87–5.11)
WBC: 10.7 10*3/uL — ABNORMAL HIGH (ref 4.0–10.5)

## 2011-02-10 LAB — BASIC METABOLIC PANEL
Calcium: 9 mg/dL (ref 8.4–10.5)
GFR calc non Af Amer: >60 mL/min (ref >60)
Glucose, Bld: 104 mg/dL — ABNORMAL HIGH (ref 70–99)
Sodium: 140 mEq/L (ref 135–145)

## 2011-02-10 LAB — GLUCOSE, CAPILLARY: Glucose-Capillary: 132 mg/dL — ABNORMAL HIGH (ref 70–99)

## 2011-02-10 LAB — LIPID PANEL
Cholesterol: 186 mg/dL (ref 0–200)
LDL Cholesterol: 87 mg/dL (ref 0–99)
VLDL: 35 mg/dL (ref 0–40)

## 2011-02-10 LAB — URINE CULTURE: Culture  Setup Time: 201206091727

## 2011-02-14 ENCOUNTER — Ambulatory Visit: Payer: Medicare Other | Admitting: Internal Medicine

## 2011-02-14 NOTE — Discharge Summary (Signed)
[name], GHAZI NO.:  [id number]  MEDICAL RECORD NO.:  [id number]  LOCATION:  4738                         FACILITY:  [facility]  PHYSICIAN:  Hartley Barefoot, MD    DATE OF BIRTH:  [birth year]  DATE OF ADMISSION:  02/09/2011 DATE OF DISCHARGE:  02/10/2011                        DISCHARGE SUMMARY - REFERRING   DISCHARGE DIAGNOSES: 1. Atypical chest pain, probably secondary to gastroesophageal reflux     disease. 2. Diabetes. 3. Hypertension. 4. Hyperlipidemia. 5. Anxiety.  PAST SURGICAL HISTORY: 1. Hysterectomy. 2. Hemorrhoid surgery. 3. Bilateral cataracts.  PROCEDURES PERFORMED: 1. 2D echo pending at this time. 2. Chest x-ray showed low lung volume with mild bibasilar atelectasis.  BRIEF HISTORY OF PRESENT ILLNESS:  The patient is an 77 year old with past medical history of hypertension and diabetes, who presents to the emergency department complaining of chest pain.  History was obtained from the son who was at bedside.  The patient does not recall where she is at.  Does not relate that she wake up the morning of admission at 12 a.m. that she was not feeling well.  She was complaining of some chest pain in middle of her chest accompanied with some shortness of breath and anxiety.  On admission, she was chest pain free.  During hospitalization, she had another episode of chest pain.  She described feel like indigestion.  HOSPITAL COURSE: 1. Chest pain, probably secondary to gastroesophageal reflux disease.     The patient at this time is chest pain free.  EKG reveal no     significant changes compare to prior EKG.  Cardiac enzymes x3     negative.  We are going to discharge her on baby aspirin, Protonix.     2D echo is pending at this time.  A TSH was normal at 2.9.  LDL was     87.  She will follow with [facility].  There office will     call her to arrange outpatient stress test for further evaluation. 2. Hypertension.  The patient's  blood pressure has been elevated     during her brief hospitalization.  I will start low-dose     hydrochlorothiazide.  She will need to follow with her primary care     physician. 3. Urinary tract infection.  She has mild leukocytosis.  UA with white     blood cells 3-6.  She has some chills.  We will discharge her on 3     days of bactrim. 4. Anxiety.  We will provide her with Xanax.  She is having some     family issues and anxiety. 5. On the day of discharge, the patient was in improved condition.     Blood pressure 160/88, respirations 20, pulse 70, temperature 98.2,     sat 96 on room air.  White blood cell 10.7, hemoglobin 11,     platelets 224.  Sodium 140, potassium 5.1.  The patient was     discharged in improved condition.  DISCHARGE MEDICATIONS: 1. Aspirin 81 mg p.o. daily. 2. Flonase 2 sprays nasally daily. 3. Hydrocodone 5/500 mg every 8 hours as needed. 4. Metformin 500 mg p.o. daily at bedtime. 5. Omeprazole 20  mg p.o. b.i.d. 6. Simvastatin 40 mg p.o. daily. 7. Xanax 0.25 mg by mouth twice daily as needed. 8. Hydrochlorothiazide 12.5 mg p.o. daily. 9. Bactrim 1 tablet p.o. b.i.d. for 3 days.     Hartley Barefoot, MD     BR/MEDQ  D:  02/10/2011  T:  02/10/2011  Job:  [id number]  Electronically Signed by Hartley Barefoot MD on 02/14/2011 07:53:36 PM

## 2011-02-14 NOTE — H&P (Signed)
[name], [name] NO.:  [id number]  MEDICAL RECORD NO.:  [id number]  LOCATION:  4738                         FACILITY:  [facility]  PHYSICIAN:  Hartley Barefoot, MD    DATE OF BIRTH:  April 08, 1923  DATE OF ADMISSION:  02/09/2011 DATE OF DISCHARGE:                             HISTORY & PHYSICAL   CHIEF COMPLAINT:  Chest pain and shortness of breath.  HISTORY OF PRESENT ILLNESS:  This is an 77 year old with past medical history of hypertension, diabetes, hyperlipidemia, who presents to the emergency department complaining of chest pain.  The history was obtained from the son that was at bedside.  The patient does not recall very well the event.  The son relates that she wake up like at 3 or 4 in the morning and went to his room and told him that she was not feeling well.  She was shaking and was a little clammy.  She complained to him of chest pain in the middle of her chest accompanied with some shortness of breath and some anxiety.  The patient at this time is chest pain free.  She does not recall the event very well.  She denies shortness of breath.  She said that she saw her primary care physician, the day prior to admission and everything was okay.  She has some swelling of her legs bilateral that has decreased.  ALLERGIES:  No known drug allergies.  REVIEW OF SYSTEMS:  Negative except as per HPI.  SOCIAL HISTORY:  She lives with her son.  She does not smoke, does not drink alcohol.  No recreational drugs.  FAMILY HISTORY:  Noncontributory.  PAST MEDICAL HISTORY: 1. Diabetes type 2. 2. Hypertension. 3. Hypercholesterolemia. 4. Gastroesophageal reflux disease. 5. Prior history of kidney stones.  PAST SURGICAL HISTORY: 1. Remote D and C. 2. Hysterectomy. 3. Hemorrhoid surgery. 4. Bilateral cataract surgery.  MEDICATIONS: 1. Aspirin. 2. Celexa 3. Flonase. 4. Metformin. 5. Omeprazole. 6. Vicodin. 7. Xanax. 8. Zocor.  PHYSICAL EXAMINATION:   VITAL SIGNS:  Blood pressure 130/58, temperature 98.1, respirations 20, sat 100%, and pulse 60. GENERAL:  The patient is lying in bed, crying, little anxious. HEENT:  Head is atraumatic and normocephalic.  Eyes anicteric.  Pupils equal and reactive to light.  Extraocular muscles intact. CARDIOVASCULAR:  S1 and S2, regular rhythm and rate.  No rubs, murmurs, or gallops. LUNGS:  Bilateral good air movement.  No crackles, wheezing, or rhonchi. ABDOMEN:  Bowel sounds positive.  Soft, nontender, nondistended.  No rigidity. EXTREMITIES:  Bilateral ankles edema. NEURO: Alert, awake, and oriented x3.  Cranial nerves II through XII intact.  Sensation grossly intact.  Motor strength 5/5.  The patient is getting anxious and worry because she was told that she might need a stress test.  ADMISSION LABS:  Sodium 140, potassium 3.2, chloride 105, bicarb 22, BUN 17, creatinine 0.81, glucose 116, calcium 8.8, CK-MB 1.9, troponin 0.30. UA, leukocyte small, BNP 151.  Chest x-ray low lung volume with mild atelectasis,  ASSESSMENT AND PLAN:  This is an 77 year old with multiple risk factors, presented with an episode of chest pain.  The patient is chest pain free at this time.  We will  admit to rule out acute coronary syndrome. 1. Chest pain, rule out acute coronary syndrome, history unclear.  The     patient does not recall the event.  History was obtained from the     son.  I will cycle cardiac enzymes.  TSH already ordered and     fasting lipid panel.  Nitroglycerin p.r.n. for pain.  Continue with     aspirin and simvastatin.  No beta-blocker at this time.  Her heart     rate is in the 60.  I reviewed EKG and it does not have     significant changes from prior EKG. 2. Diabetes.  We will start sliding scale insulin, hold oral     medications while in the hospital. 3. Hypertension.  Blood pressure in the 130 range.  We will ask     pharmacy to do med rec form. 4. Hyperlipidemia.  Continue Zocor. 5.  Hypokalemia.  We will replete with 40 mEq of potassium and we will     check mag level. 6. For deep venous thrombosis prophylaxis, SCDs.     Hartley Barefoot, MD     BR/MEDQ  D:  02/09/2011  T:  02/09/2011  Job:  [id number]  Electronically Signed by Hartley Barefoot MD on 02/14/2011 07:51:38 PM

## 2011-02-20 ENCOUNTER — Encounter: Payer: Self-pay | Admitting: Cardiovascular Disease

## 2011-02-20 ENCOUNTER — Ambulatory Visit (INDEPENDENT_AMBULATORY_CARE_PROVIDER_SITE_OTHER): Payer: Medicare Other | Admitting: Cardiovascular Disease

## 2011-02-20 VITALS — BP 140/72 | HR 73 | Ht 63.0 in | Wt 156.0 lb

## 2011-02-20 DIAGNOSIS — I739 Peripheral vascular disease, unspecified: Secondary | ICD-10-CM

## 2011-02-20 DIAGNOSIS — M79606 Pain in leg, unspecified: Secondary | ICD-10-CM

## 2011-02-20 DIAGNOSIS — R0789 Other chest pain: Secondary | ICD-10-CM

## 2011-02-20 DIAGNOSIS — M79609 Pain in unspecified limb: Secondary | ICD-10-CM

## 2011-02-20 NOTE — Patient Instructions (Signed)
Your physician recommends that you schedule a follow-up appointment as needed.  Your physician has requested that you have an ankle brachial index (ABI). During this test an ultrasound and blood pressure cuff are used to evaluate the arteries that supply the arms and legs with blood. Allow thirty minutes for this exam. There are no restrictions or special instructions.

## 2011-02-20 NOTE — Assessment & Plan Note (Signed)
Will get ABI to exclude lower extremity arterial disease.

## 2011-02-20 NOTE — Progress Notes (Signed)
History of Present Illness: 77 yo WF with h/o DM, HTN, hyperlipidemia, GERD here today new patient evaluation. She was admitted to [facility] February 09, 2011 with left sided chest pain. Cardiac enzymes were negative. Echo with normal LV function. There was no SOB, dizziness, nausea, vomiting, diaphoresis. She has had no recurrence of pain since leaving the hospital. She does describe some lower extremity swelling that resolves at night. Also, some pain in her legs when walking. Resolves with rest.   Past Medical History  Diagnosis Date  . ANEMIA, DEFICIENCY, HX OF 03/01/2010  . DEPRESSIVE DISORDER 09/19/2010  . DM w/o Complication Type II 05/18/2007  . GERD 05/18/2007  . HYPERLIPIDEMIA 03/17/2008  . HYPERTENSION 05/18/2007  . INSOMNIA 11/23/2008  . Loss of weight 05/30/2010  . Other abnormal blood chemistry 04/23/2010  . Kidney stone   . Glaucoma     Past Surgical History  Procedure Date  . Abdominal hysterectomy   . Cataract extraction   . Hemorrhoid surgery     Current Outpatient Prescriptions  Medication Sig Dispense Refill  . ALPRAZolam (XANAX) 0.25 MG tablet Take 1 tablet (0.25 mg total) by mouth 2 (two) times daily as needed.  50 tablet  2  . aspirin 81 MG tablet Take 81 mg by mouth daily.        . hydrochlorothiazide (,MICROZIDE/HYDRODIURIL,) 12.5 MG capsule Take 12.5 mg by mouth daily.        . metFORMIN (GLUCOPHAGE-XR) 500 MG 24 hr tablet Take 500 mg by mouth daily with breakfast.        . omeprazole (PRILOSEC) 20 MG capsule Take 20 mg by mouth 2 (two) times daily.        . simvastatin (ZOCOR) 80 MG tablet Take by mouth. 1/2 daily        . traMADol (ULTRAM) 50 MG tablet Take 50 mg by mouth every 6 (six) hours as needed.        . fluticasone (FLONASE) 50 MCG/ACT nasal spray 2 sprays by Nasal route daily.        Marland Kitchen DISCONTD: citalopram (CELEXA) 10 MG tablet TAKE 1 TABLET BY MOUTH EVERY MORNING  90 tablet  1  . DISCONTD: furosemide (LASIX) 20 MG tablet Take 1 tablet (20 mg  total) by mouth daily.  50 tablet  0  . DISCONTD: HYDROcodone-acetaminophen (VICODIN) 5-500 MG per tablet Take 1 tablet by mouth every 8 (eight) hours as needed.        Marland Kitchen DISCONTD: ibuprofen (ADVIL,MOTRIN) 200 MG tablet Take 400 mg by mouth every 6 (six) hours as needed.          Allergies  Allergen Reactions  . Amoxicillin     REACTION: unspecified  . Penicillins     REACTION: rash    History   Social History  . Marital Status: Widowed    Spouse Name: N/A    Number of Children: N/A  . Years of Education: N/A   Occupational History  . Not on file.   Social History Main Topics  . Smoking status: Never Smoker   . Smokeless tobacco: Never Used  . Alcohol Use: No  . Drug Use: No  . Sexually Active: Not on file   Other Topics Concern  . Not on file   Social History Narrative  . No narrative on file    Family History  Problem Relation Age of Onset  . Cancer Father     Review of Systems:  As stated in the HPI and  otherwise negative.   BP 140/72  Pulse 73  Ht 5\' 3"  (1.6 m)  Wt 156 lb (70.761 kg)  BMI 27.63 kg/m2  Physical Examination: General: Well developed, well nourished, NAD HEENT: OP clear, mucus membranes moist SKIN: warm, dry. No rashes. Neuro: No focal deficits Musculoskeletal: Muscle strength 5/5 all ext Psychiatric: Mood and affect normal Neck: No JVD, no carotid bruits, no thyromegaly, no lymphadenopathy. Lungs:Clear bilaterally, no wheezes, rhonci, crackles Cardiovascular: Regular rate and rhythm. No murmurs, gallops or rubs. Abdomen:Soft. Bowel sounds present. Non-tender.  Extremities: No lower extremity edema. Pulses are 2 + in the bilateral DP/PT.

## 2011-02-20 NOTE — Assessment & Plan Note (Signed)
Her chest pain occurred once and has not recurred. Cardiac markers were negative. Echo was normal. NO further cardiac workup.

## 2011-02-21 ENCOUNTER — Ambulatory Visit: Payer: Medicare Other | Admitting: Internal Medicine

## 2011-02-27 ENCOUNTER — Telehealth: Payer: Self-pay | Admitting: *Deleted

## 2011-02-27 NOTE — Telephone Encounter (Signed)
Son called stating his Mom is weak and shaking.  No chest pain, minimal SOB.  Advised ER visit, but he informed me that his Mom is just coming out of the hospital, and saw a cardiologist with neg work up.  Has appt with Dr. Kirtland Bouchard tomorrow.  No fever.  Advised if she gets worse of spikes fever tonight to take her to the ER otherwise she can keep her appt.

## 2011-02-28 ENCOUNTER — Encounter: Payer: Self-pay | Admitting: Internal Medicine

## 2011-02-28 ENCOUNTER — Ambulatory Visit (INDEPENDENT_AMBULATORY_CARE_PROVIDER_SITE_OTHER): Payer: Medicare Other | Admitting: Internal Medicine

## 2011-02-28 DIAGNOSIS — E119 Type 2 diabetes mellitus without complications: Secondary | ICD-10-CM

## 2011-02-28 DIAGNOSIS — R0789 Other chest pain: Secondary | ICD-10-CM

## 2011-02-28 DIAGNOSIS — K219 Gastro-esophageal reflux disease without esophagitis: Secondary | ICD-10-CM

## 2011-02-28 DIAGNOSIS — I10 Essential (primary) hypertension: Secondary | ICD-10-CM

## 2011-02-28 DIAGNOSIS — E785 Hyperlipidemia, unspecified: Secondary | ICD-10-CM

## 2011-02-28 MED ORDER — ALPRAZOLAM 0.25 MG PO TABS: 0.2500 mg | ORAL_TABLET | Freq: Two times a day (BID) | ORAL | Status: DC | PRN

## 2011-02-28 NOTE — Progress Notes (Signed)
  Subjective:    Patient ID: [name], female    DOB: 11-25-1922, 77 y.o.   MRN: [id number]  HPI  is an 77 year old patient who has a history of type 2 diabetes dyslipidemia untreated hypertension. She was hospitalized earlier in month for atypical chest pain that was felt to be noncardiac. She has seen cardiology in followup post admission. She has a history of anxiety and occasional insomnia. She has been prescribed alprazolam to take when necessary. Her diabetes has been controlled on metformin therapy. She is on omeprazole as well as simvastatin.    Review of Systems  Constitutional: Negative.   HENT: Negative for hearing loss, congestion, sore throat, rhinorrhea, dental problem, sinus pressure and tinnitus.   Eyes: Negative for pain, discharge and visual disturbance.  Respiratory: Positive for shortness of breath. Negative for cough.   Cardiovascular: Positive for leg swelling. Negative for chest pain and palpitations.  Gastrointestinal: Negative for nausea, vomiting, abdominal pain, diarrhea, constipation, blood in stool and abdominal distention.  Genitourinary: Negative for dysuria, urgency, frequency, hematuria, flank pain, vaginal bleeding, vaginal discharge, difficulty urinating, vaginal pain and pelvic pain.  Musculoskeletal: Negative for joint swelling, arthralgias and gait problem.  Skin: Negative for rash.  Neurological: Negative for dizziness, syncope, speech difficulty, weakness, numbness and headaches.  Hematological: Negative for adenopathy.  Psychiatric/Behavioral: Negative for behavioral problems, dysphoric mood and agitation. The patient is not nervous/anxious.        Objective:   Physical Exam  Constitutional: She is oriented to person, place, and time. She appears well-developed and well-nourished.       Blood pressure 142/80  HENT:  Head: Normocephalic.  Right Ear: External ear normal.  Left Ear: External ear normal.  Mouth/Throat: Oropharynx is clear and  moist.  Eyes: Conjunctivae and EOM are normal. Pupils are equal, round, and reactive to light.  Neck: Normal range of motion. Neck supple. No thyromegaly present.  Cardiovascular: Normal rate, regular rhythm, normal heart sounds and intact distal pulses.   Pulmonary/Chest: Effort normal and breath sounds normal.  Abdominal: Soft. Bowel sounds are normal. She exhibits no mass. There is no tenderness.  Musculoskeletal: Normal range of motion. She exhibits edema.       +1 edema  Lymphadenopathy:    She has no cervical adenopathy.  Neurological: She is alert and oriented to person, place, and time.  Skin: Skin is warm and dry. No rash noted.  Psychiatric: She has a normal mood and affect. Her behavior is normal.          Assessment & Plan:   Diabetes mellitus Hypertension Dyslipidemia Atypical chest pain-hospital records reviewed Anxiety disorder  Medical regimen unchanged.  New prescriptions dispensed. Return here in 3 months or when necessary;  a hemoglobin A1c will be checked next visit

## 2011-02-28 NOTE — Telephone Encounter (Signed)
Dr. K notified. 

## 2011-02-28 NOTE — Patient Instructions (Signed)
Limit your sodium (Salt) intake   Please check your hemoglobin A1c every 3 months    It is important that you exercise regularly, at least 20 minutes 3 to 4 times per week.  If you develop chest pain or shortness of breath seek  medical attention.  Return in 3 months for follow-up  

## 2011-03-04 ENCOUNTER — Encounter: Payer: Self-pay | Admitting: Internal Medicine

## 2011-03-04 ENCOUNTER — Ambulatory Visit (INDEPENDENT_AMBULATORY_CARE_PROVIDER_SITE_OTHER): Payer: Medicare Other | Admitting: Internal Medicine

## 2011-03-04 DIAGNOSIS — I10 Essential (primary) hypertension: Secondary | ICD-10-CM

## 2011-03-04 DIAGNOSIS — M79606 Pain in leg, unspecified: Secondary | ICD-10-CM

## 2011-03-04 DIAGNOSIS — M79609 Pain in unspecified limb: Secondary | ICD-10-CM

## 2011-03-04 MED ORDER — FUROSEMIDE 20 MG PO TABS: ORAL_TABLET | ORAL | Status: DC

## 2011-03-04 NOTE — Patient Instructions (Signed)
Limit your sodium (Salt) intake  Use furosemide once or twice daily to control swelling  Keep legs elevated as much as possible to control swelling and

## 2011-03-04 NOTE — Progress Notes (Signed)
  Subjective:    Patient ID: [name], female    DOB: 01-02-1923, 77 y.o.   MRN: [id number]  HPI  77 year old patient who presents with a chief complaint of lower extremity edema and leg pain. The patient called the office earlier this morning apparently tearful due to severe leg pain from the knee to the lower calf area. She states that for the past 2 days she has had much worsening lower extremity edema. She states that compared to earlier today it has improved ;  she is much more comfortable. She denies any symptoms of congestive heart failure Lasix was added to her regimen last visit due to mild peripheral edema. Apparently she remains on hydrochlorothiazide for blood pressure control only she is not using furosemide.  Review of Systems  Constitutional: Negative.   HENT: Negative for hearing loss, congestion, sore throat, rhinorrhea, dental problem, sinus pressure and tinnitus.   Eyes: Negative for pain, discharge and visual disturbance.  Respiratory: Negative for cough and shortness of breath.   Cardiovascular: Positive for leg swelling. Negative for chest pain and palpitations.  Gastrointestinal: Negative for nausea, vomiting, abdominal pain, diarrhea, constipation, blood in stool and abdominal distention.  Genitourinary: Negative for dysuria, urgency, frequency, hematuria, flank pain, vaginal bleeding, vaginal discharge, difficulty urinating, vaginal pain and pelvic pain.  Musculoskeletal: Negative for joint swelling, arthralgias and gait problem.  Skin: Negative for rash.  Neurological: Negative for dizziness, syncope, speech difficulty, weakness, numbness and headaches.  Hematological: Negative for adenopathy.  Psychiatric/Behavioral: Negative for behavioral problems, dysphoric mood and agitation. The patient is not nervous/anxious.        Objective:   Physical Exam  Constitutional: She is oriented to person, place, and time. She appears well-developed and well-nourished.    HENT:  Head: Normocephalic.  Right Ear: External ear normal.  Left Ear: External ear normal.  Mouth/Throat: Oropharynx is clear and moist.  Eyes: Conjunctivae and EOM are normal. Pupils are equal, round, and reactive to light.  Neck: Normal range of motion. Neck supple. No thyromegaly present.  Cardiovascular: Normal rate, regular rhythm, normal heart sounds and intact distal pulses.   Pulmonary/Chest: Effort normal and breath sounds normal. She has no rales.  Abdominal: Soft. Bowel sounds are normal. She exhibits no mass. There is no tenderness.  Musculoskeletal: Normal range of motion. She exhibits edema.       There was +1 ankle and pedal edema but no significant calf tenderness or swelling  Lymphadenopathy:    She has no cervical adenopathy.  Neurological: She is alert and oriented to person, place, and time.  Skin: Skin is warm and dry. No rash noted.  Psychiatric: She has a normal mood and affect. Her behavior is normal.          Assessment & Plan:   Lower extremity edema. This appears be very modest at this time and she is quite comfortable there is no calf tenderness or swelling and edema is distal only. We'll give a prescription for furosemide to take once or twice daily for fluid control only she will be maintained on her hydrochlorothiazide. Will return in 4 weeks for followup.

## 2011-03-05 ENCOUNTER — Ambulatory Visit (INDEPENDENT_AMBULATORY_CARE_PROVIDER_SITE_OTHER): Payer: Medicare Other | Admitting: Internal Medicine

## 2011-03-05 ENCOUNTER — Encounter: Payer: Self-pay | Admitting: Internal Medicine

## 2011-03-05 DIAGNOSIS — I10 Essential (primary) hypertension: Secondary | ICD-10-CM

## 2011-03-05 DIAGNOSIS — K219 Gastro-esophageal reflux disease without esophagitis: Secondary | ICD-10-CM

## 2011-03-05 DIAGNOSIS — F329 Major depressive disorder, single episode, unspecified: Secondary | ICD-10-CM

## 2011-03-05 DIAGNOSIS — R0602 Shortness of breath: Secondary | ICD-10-CM

## 2011-03-05 DIAGNOSIS — R0789 Other chest pain: Secondary | ICD-10-CM

## 2011-03-05 DIAGNOSIS — Z87898 Personal history of other specified conditions: Secondary | ICD-10-CM

## 2011-03-05 LAB — BRAIN NATRIURETIC PEPTIDE: Pro B Natriuretic peptide (BNP): 47 pg/mL (ref 0.0–100.0)

## 2011-03-05 MED ORDER — SERTRALINE HCL 25 MG PO TABS: 25.0000 mg | ORAL_TABLET | Freq: Every day | ORAL | Status: DC

## 2011-03-05 NOTE — Progress Notes (Signed)
  Subjective:    Patient ID: [name], female    DOB: 1923-01-08, 77 y.o.   MRN: [id number]  HPI  77 year old patient who is seen today in followup. She was seen here yesterday and was also hospitalized last month for evaluation of atypical chest pain and shortness of breath. Her history is somewhat inconsistent but her chief complaint appears to be episodes of shortness of breath. She does have a history of anxiety and depression. Hospital evaluation  included a normal 2-D echocardiogram with normal LV function.  Unclear whether the patient has had any further chest pain. At the present time she describes anxiety and shakiness. Hospital records were reviewed.   Review of Systems  Constitutional: Negative.   HENT: Negative for hearing loss, congestion, sore throat, rhinorrhea, dental problem, sinus pressure and tinnitus.   Eyes: Negative for pain, discharge and visual disturbance.  Respiratory: Positive for shortness of breath. Negative for cough.   Cardiovascular: Positive for chest pain. Negative for palpitations and leg swelling.  Gastrointestinal: Negative for nausea, vomiting, abdominal pain, diarrhea, constipation, blood in stool and abdominal distention.  Genitourinary: Negative for dysuria, urgency, frequency, hematuria, flank pain, vaginal bleeding, vaginal discharge, difficulty urinating, vaginal pain and pelvic pain.  Musculoskeletal: Negative for joint swelling, arthralgias and gait problem.  Skin: Negative for rash.  Neurological: Positive for tremors. Negative for dizziness, syncope, speech difficulty, weakness, numbness and headaches.  Hematological: Negative for adenopathy.  Psychiatric/Behavioral: Negative for behavioral problems, dysphoric mood and agitation. The patient is nervous/anxious.        Objective:   Physical Exam  Constitutional: She is oriented to person, place, and time. She appears well-developed and well-nourished.       Slightly tremulous; anxious and  mildly confused  HENT:  Head: Normocephalic.  Right Ear: External ear normal.  Left Ear: External ear normal.  Mouth/Throat: Oropharynx is clear and moist.  Eyes: Conjunctivae and EOM are normal. Pupils are equal, round, and reactive to light.  Neck: Normal range of motion. Neck supple. No thyromegaly present.  Cardiovascular: Normal rate, regular rhythm, normal heart sounds and intact distal pulses.   Pulmonary/Chest: Effort normal and breath sounds normal.       O2 saturation 99 Pulse rate 82  Abdominal: Soft. Bowel sounds are normal. She exhibits no mass. There is no tenderness.  Musculoskeletal: Normal range of motion.       There is no peripheral edema. Ankles are somewhat  meaty  but no clinical edema dorsalis pedis pulses are full posterior tibial pulses were difficult to palpate  Lymphadenopathy:    She has no cervical adenopathy.  Neurological: She is alert and oriented to person, place, and time.  Skin: Skin is warm and dry. No rash noted.  Psychiatric: She has a normal mood and affect. Her behavior is normal.          Assessment & Plan:   Episodic shortness of breath. No congestive heart failure. Suspect acute anxiety. We'll check a d-dimer and continue her present regimen. We'll place on sertraline 25 mg every morning. Will recheck in 2 weeks Hypertension well controlled Diabetes mellitus type 2

## 2011-03-05 NOTE — Patient Instructions (Signed)
Limit your sodium (Salt) intake  Return in two weeks for follow-up

## 2011-03-07 ENCOUNTER — Telehealth: Payer: Self-pay | Admitting: *Deleted

## 2011-03-07 ENCOUNTER — Emergency Department (HOSPITAL_COMMUNITY): Payer: Medicare Other

## 2011-03-07 ENCOUNTER — Observation Stay (HOSPITAL_COMMUNITY)
Admission: EM | Admit: 2011-03-07 | Discharge: 2011-03-09 | Disposition: A | Discharge status: Home / Self Care | Payer: Medicare Other | Attending: Internal Medicine | Admitting: Internal Medicine

## 2011-03-07 DIAGNOSIS — F411 Generalized anxiety disorder: Secondary | ICD-10-CM | POA: Insufficient documentation

## 2011-03-07 DIAGNOSIS — I10 Essential (primary) hypertension: Secondary | ICD-10-CM | POA: Insufficient documentation

## 2011-03-07 DIAGNOSIS — Z79899 Other long term (current) drug therapy: Secondary | ICD-10-CM | POA: Insufficient documentation

## 2011-03-07 DIAGNOSIS — R079 Chest pain, unspecified: Secondary | ICD-10-CM | POA: Insufficient documentation

## 2011-03-07 DIAGNOSIS — R42 Dizziness and giddiness: Secondary | ICD-10-CM | POA: Insufficient documentation

## 2011-03-07 DIAGNOSIS — E860 Dehydration: Secondary | ICD-10-CM | POA: Insufficient documentation

## 2011-03-07 DIAGNOSIS — E119 Type 2 diabetes mellitus without complications: Secondary | ICD-10-CM | POA: Insufficient documentation

## 2011-03-07 DIAGNOSIS — E876 Hypokalemia: Secondary | ICD-10-CM | POA: Insufficient documentation

## 2011-03-07 DIAGNOSIS — D649 Anemia, unspecified: Secondary | ICD-10-CM | POA: Insufficient documentation

## 2011-03-07 DIAGNOSIS — E785 Hyperlipidemia, unspecified: Secondary | ICD-10-CM | POA: Insufficient documentation

## 2011-03-07 DIAGNOSIS — K219 Gastro-esophageal reflux disease without esophagitis: Principal | ICD-10-CM | POA: Insufficient documentation

## 2011-03-07 LAB — DIFFERENTIAL
Lymphocytes Relative: 21 % (ref 12–46)
Lymphs Abs: 2.2 10*3/uL (ref 0.7–4.0)
Neutrophils Relative %: 72 % (ref 43–77)

## 2011-03-07 LAB — COMPREHENSIVE METABOLIC PANEL
AST: 15 U/L (ref 0–37)
BUN: 18 mg/dL (ref 6–23)
CO2: 26 mEq/L (ref 19–32)
Chloride: 91 mEq/L — ABNORMAL LOW (ref 96–112)
Creatinine, Ser: 1.01 mg/dL (ref 0.50–1.10)
GFR calc non Af Amer: 52 mL/min — ABNORMAL LOW (ref >60)
Total Bilirubin: 0.6 mg/dL (ref 0.3–1.2)

## 2011-03-07 LAB — CBC
HCT: 37.7 % (ref 36.0–46.0)
MCV: 82.1 fL (ref 78.0–100.0)
Platelets: 279 10*3/uL (ref 150–400)
RBC: 4.59 MIL/uL (ref 3.87–5.11)
WBC: 10.3 10*3/uL (ref 4.0–10.5)

## 2011-03-07 LAB — CK TOTAL AND CKMB (NOT AT ARMC): CK, MB: 1.6 ng/mL (ref 0.3–4.0)

## 2011-03-07 NOTE — Telephone Encounter (Signed)
agree

## 2011-03-07 NOTE — Telephone Encounter (Signed)
Pt is calling complaining of chest pain with heart pounding.  Advised if she is having severe chest pain, she should go to the ER ASAP.

## 2011-03-08 DIAGNOSIS — R079 Chest pain, unspecified: Secondary | ICD-10-CM

## 2011-03-08 LAB — CBC
Hemoglobin: 11.7 g/dL — ABNORMAL LOW (ref 12.0–15.0)
MCH: 28.2 pg (ref 26.0–34.0)
RBC: 4.15 MIL/uL (ref 3.87–5.11)
WBC: 10.1 10*3/uL (ref 4.0–10.5)

## 2011-03-08 LAB — LIPID PANEL: LDL Cholesterol: 58 mg/dL (ref 0–99)

## 2011-03-08 LAB — DIFFERENTIAL
Basophils Absolute: 0 10*3/uL (ref 0.0–0.1)
Basophils Relative: 0 % (ref 0–1)
Monocytes Relative: 6 % (ref 3–12)
Neutro Abs: 7 10*3/uL (ref 1.7–7.7)
Neutrophils Relative %: 69 % (ref 43–77)

## 2011-03-08 LAB — COMPREHENSIVE METABOLIC PANEL
ALT: 8 U/L (ref 0–35)
AST: 20 U/L (ref 0–37)
Alkaline Phosphatase: 40 U/L (ref 39–117)
CO2: 25 mEq/L (ref 19–32)
Chloride: 100 mEq/L (ref 96–112)
GFR calc Af Amer: >60 mL/min (ref >60)
GFR calc non Af Amer: >60 mL/min (ref >60)
Glucose, Bld: 113 mg/dL — ABNORMAL HIGH (ref 70–99)
Potassium: 4.1 mEq/L (ref 3.5–5.1)
Sodium: 137 mEq/L (ref 135–145)
Total Bilirubin: 0.9 mg/dL (ref 0.3–1.2)

## 2011-03-08 LAB — GLUCOSE, CAPILLARY
Glucose-Capillary: 118 mg/dL — ABNORMAL HIGH (ref 70–99)
Glucose-Capillary: 136 mg/dL — ABNORMAL HIGH (ref 70–99)
Glucose-Capillary: 107 mg/dL — ABNORMAL HIGH (ref 70–99)

## 2011-03-08 LAB — MAGNESIUM: Magnesium: 1.8 mg/dL (ref 1.5–2.5)

## 2011-03-08 LAB — CARDIAC PANEL(CRET KIN+CKTOT+MB+TROPI)
CK, MB: 1.6 ng/mL (ref 0.3–4.0)
Relative Index: INVALID (ref 0.0–2.5)

## 2011-03-08 NOTE — H&P (Signed)
NAMESEMAJ, KHAM NO.:  [id number]  MEDICAL RECORD NO.:  [id number]  LOCATION:  [facility]                         FACILITY:  [facility]  PHYSICIAN:  Talmage Nap, MD  DATE OF BIRTH:  06-18-1923  DATE OF ADMISSION:  03/07/2011 DATE OF DISCHARGE:                             HISTORY & PHYSICAL   PRIMARY CARE PHYSICIAN:  Gordy Savers, MD  History obtainable from the patient and the patient's son.  CHIEF COMPLAINT:  Chest pain started today.  The patient is an 77 year old Caucasian female with history of  diabetes mellitus and hypertension as well as chronic anxiety disorder, presenting to the emergency room with chest pain, which started today.  Pain is described as retrosternal about 6/10 intensity and was radiating to the left breast.  This was; however said to be associated with shortness of breath and nausea.  She denied any vomiting.  She denied any fever.  She denied any chills.  She denied any rigor.  She denied any history of cough.  The pain was said to have persisted hence the patient was brought to the emergency room to be evaluated.  In the ED, the patient was given nitro and pain subsided.  PAST MEDICAL HISTORY:  Positive for hypertension, diabetes mellitus, hyperlipidemia and chronic anxiety disorder.  PAST SURGICAL HISTORY:  Hysterectomy.  PREADMISSION MEDICATIONS: 1. Sertraline 25 mg one p.o. daily. 2. Xanax. 3. Alprazolam 0.25 mg 1 p.o. b.i.d. p.r.n. 4. Simvastatin 80 mg half a tablet p.o. daily. 5. Omeprazole 20 mg one p.o. b.i.d. 6. Metformin XR 500 mg 1 p.o. daily at bedtime. 7. Hydrochlorothiazide 12.5 mg one p.o. daily.  ALLERGIES:  She has no known allergies.  SOCIAL HISTORY:  Negative for alcohol or tobacco use.  The patient's son lives with the patient.  FAMILY HISTORY:  Negative for any coronary artery disease.  In the review of systems, the patient denies any history of headaches.  No blurred vision.  No nausea or  vomiting.  No fever.  No chills.  No rigor.  Chest pain had subsided.  Denies any cough.  No PND or orthopnea.  No abdominal discomfort.  No diarrhea or hematochezia.  No dysuria or hematuria.  She has periodic swelling of the lower extremity. No intolerance to heat or cold and no neuropsychiatric disorder.  PHYSICAL EXAMINATION:  GENERAL:  Elderly lady, not in any respiratory distress, dehydrated. VITAL SIGNS:  Blood pressure is 102/70, pulse 70, respiratory rate is 16, temperature is 98.1.  HEENT: Pallor.  Pupils are reactive to light and extraocular muscles are intact. NECK:  No jugular venous distention.  No carotid bruit.  No lymphadenopathy. CHEST:  Clear to auscultation. HEART:  Sounds are 1 and 2. ABDOMEN:  Soft, nontender.  Liver, spleen, kidney not palpable.  Bowel sounds are positive.  EXTREMITIES:  Trace edema. NEUROLOGIC:  Nonfocal. MUSCULOSKELETAL SYSTEM:  Arthritic changes in the knees and feet. NEUROPSYCHIATRIC EVALUATION:  Unremarkable. SKIN:  Decreased turgor.  LABORATORY DATA:  First set of cardiac markers, troponin-I less than 0.30.  Chemistry showed sodium of 139, potassium of 2.9, chloride of 91 with a bicarb of 26, glucose is 98, BUN is 18, creatinine is 1.01. Hematological  indices showed WBC of 7.3, hemoglobin of 13.0, hematocrit 37.7, MCV 82. 2, platelet count of 279 with normal differential.  EKG showed sinus rhythm with no acute ST-wave change.  Chest x-ray done was normal.  IMPRESSION: 1. Chest pain, rule out acute coronary syndrome. 2. Dehydration. 3. Hypokalemia. 4. Hypertension. 5. Diabetes mellitus. 6. Hyperlipidemia. 7. Chronic anxiety disorder.  PLAN:  Admit the patient to telemetry.  The patient will be rehydrated with normal saline with 20 mEq of KCl at a rate of 75 mL/h.  She will be on aspirin 325 mg p.o. daily, nitroglycerin sublingual p.r.n. for chest pain.  Other medication to be given to the patient will include morphine 2 mg IV q.4  h. p.r.n. for chest pain, Lopressor 25 mg p.o. b.i.d.  She will be on Accu-Cheks t.i.d. with a.c. h.s. with regular insulin sliding scale and brachial moderate scale and for chronic anxiety disorder.  The patient will be on Xanax 0.25 mg p.o. b.i.d. p.r.n.  She will also be restarted on Zocor 40 mg p.o. daily for hyperlipidemia.  GI prophylaxis will be done with Protonix 40 mg IV q.24 h. and DVT prophylaxis with Lovenox 40 mg subcu q.24 h.  Further workup to be done on this patient will include cardiac enzymes q. 6 h. x3, hemoglobin A1c, lipid panel, CBC, CMP, and mag level will be repeated in a.m.  The patient will also have a 2-D echo done in this admission.  She will be followed and evaluated on day-to-day basis.     Talmage Nap, MD     CN/MEDQ  D:  03/07/2011  T:  03/07/2011  Job:  [id number]  Electronically Signed by Talmage Nap  on 03/08/2011 05:47:33 AM

## 2011-03-09 ENCOUNTER — Inpatient Hospital Stay (HOSPITAL_COMMUNITY): Payer: Medicare Other

## 2011-03-09 DIAGNOSIS — R079 Chest pain, unspecified: Secondary | ICD-10-CM

## 2011-03-09 LAB — BASIC METABOLIC PANEL
CO2: 26 mEq/L (ref 19–32)
Chloride: 104 mEq/L (ref 96–112)
GFR calc Af Amer: >60 mL/min (ref >60)
Potassium: 3.7 mEq/L (ref 3.5–5.1)

## 2011-03-09 LAB — GLUCOSE, CAPILLARY
Glucose-Capillary: 123 mg/dL — ABNORMAL HIGH (ref 70–99)
Glucose-Capillary: 146 mg/dL — ABNORMAL HIGH (ref 70–99)

## 2011-03-09 LAB — D-DIMER, QUANTITATIVE: D-Dimer, Quant: 0.49 ug/mL-FEU — ABNORMAL HIGH (ref 0.00–0.48)

## 2011-03-09 MED ORDER — TECHNETIUM TC 99M TETROFOSMIN IV KIT
30.0000 | PACK | Freq: Once | INTRAVENOUS | Status: AC | PRN
  Administered 2011-03-09: 30 via INTRAVENOUS

## 2011-03-09 MED ORDER — TECHNETIUM TC 99M TETROFOSMIN IV KIT
10.0000 | PACK | Freq: Once | INTRAVENOUS | Status: AC | PRN
  Administered 2011-03-09: 10 via INTRAVENOUS

## 2011-03-10 ENCOUNTER — Other Ambulatory Visit (HOSPITAL_COMMUNITY): Payer: Medicare Other

## 2011-03-12 ENCOUNTER — Ambulatory Visit (INDEPENDENT_AMBULATORY_CARE_PROVIDER_SITE_OTHER): Payer: Medicare Other | Admitting: Internal Medicine

## 2011-03-12 ENCOUNTER — Ambulatory Visit: Payer: Medicare Other | Admitting: Internal Medicine

## 2011-03-12 ENCOUNTER — Encounter: Payer: Self-pay | Admitting: Internal Medicine

## 2011-03-12 DIAGNOSIS — E785 Hyperlipidemia, unspecified: Secondary | ICD-10-CM

## 2011-03-12 DIAGNOSIS — F329 Major depressive disorder, single episode, unspecified: Secondary | ICD-10-CM

## 2011-03-12 DIAGNOSIS — I10 Essential (primary) hypertension: Secondary | ICD-10-CM

## 2011-03-12 DIAGNOSIS — R0789 Other chest pain: Secondary | ICD-10-CM

## 2011-03-12 MED ORDER — SERTRALINE HCL 25 MG PO TABS: 50.0000 mg | ORAL_TABLET | Freq: Every day | ORAL | Status: DC

## 2011-03-12 MED ORDER — ALPRAZOLAM 0.25 MG PO TABS: ORAL_TABLET | ORAL | Status: DC

## 2011-03-12 NOTE — Progress Notes (Signed)
  Subjective:    Patient ID: [name], female    DOB: Jun 23, 1923, 77 y.o.   MRN: [id number]  HPI  77 year old patient who is seen today post hospital followup. She presented to the hospital again for evaluation of shortness of breath and atypical chest pain. Evaluation was unremarkable including a nuclear medicine stress test. She remains quite anxious but in general fairly stable. She is accompanied by her son and husband. Results of all the hospital evaluation discussed at length. She was reassured    Review of Systems  Constitutional: Negative.   HENT: Negative for hearing loss, congestion, sore throat, rhinorrhea, dental problem, sinus pressure and tinnitus.   Eyes: Negative for pain, discharge and visual disturbance.  Respiratory: Negative for cough and shortness of breath.   Cardiovascular: Positive for chest pain. Negative for palpitations and leg swelling.  Gastrointestinal: Negative for nausea, vomiting, abdominal pain, diarrhea, constipation, blood in stool and abdominal distention.  Genitourinary: Negative for dysuria, urgency, frequency, hematuria, flank pain, vaginal bleeding, vaginal discharge, difficulty urinating, vaginal pain and pelvic pain.  Musculoskeletal: Negative for joint swelling, arthralgias and gait problem.  Skin: Negative for rash.  Neurological: Negative for dizziness, syncope, speech difficulty, weakness, numbness and headaches.  Hematological: Negative for adenopathy.  Psychiatric/Behavioral: Positive for behavioral problems. Negative for dysphoric mood and agitation. The patient is nervous/anxious.        Objective:   Physical Exam  Constitutional: She is oriented to person, place, and time. She appears well-developed and well-nourished.       Blood pressure 130/80  HENT:  Head: Normocephalic.  Right Ear: External ear normal.  Left Ear: External ear normal.  Mouth/Throat: Oropharynx is clear and moist.  Eyes: Conjunctivae and EOM are normal.  Pupils are equal, round, and reactive to light.  Neck: Normal range of motion. Neck supple. No thyromegaly present.  Cardiovascular: Normal rate, regular rhythm, normal heart sounds and intact distal pulses.   Pulmonary/Chest: Effort normal and breath sounds normal.       O2 saturation 98  Abdominal: Soft. Bowel sounds are normal. She exhibits no mass. There is no tenderness.  Musculoskeletal: Normal range of motion. She exhibits no edema.       Soft tissue thickening about the ankles but no definite edema  Lymphadenopathy:    She has no cervical adenopathy.  Neurological: She is alert and oriented to person, place, and time.  Skin: Skin is warm and dry. No rash noted.  Psychiatric: She has a normal mood and affect. Her behavior is normal.          Assessment & Plan:   Atypical chest pain Anxiety disorder Hypertension Diabetes mellitus type 2  We'll increase her alprazolam to a 3 times a day regimen and recheck in 1 month. We'll increase her sertraline to 50 mg every morning

## 2011-03-12 NOTE — Patient Instructions (Signed)
Limit your sodium (Salt) intake  Return in one month for follow-up 

## 2011-03-13 ENCOUNTER — Encounter: Payer: Medicare Other | Admitting: Cardiology

## 2011-03-13 NOTE — Discharge Summary (Signed)
  [name], MEUSER              ACCOUNT NO.:  [id number]  MEDICAL RECORD NO.:  [id number]  LOCATION:  3703                         FACILITY:  [facility]  PHYSICIAN:  Hartley Barefoot, MD    DATE OF BIRTH:  12-29-1922  DATE OF ADMISSION:  03/07/2011 DATE OF DISCHARGE:  03/09/2011                              DISCHARGE SUMMARY   DISCHARGE DIAGNOSES: 1. Chest pain secondary to gastroesophageal reflux disease. 2. Hypertension. 3. Dehydration. 4. Anxiety. 5. Hypokalemia.  DISCHARGE MEDICATIONS: 1. Aspirin 81 mg p.o. daily. 2. Hydrochlorothiazide 12.5 p.o. daily. 3. Metformin 500 mg p.o. daily at bedtime. 4. Omeprazole 20 mg p.o. b.i.d. 5. Sertraline 25 mg p.o. daily. 6. Simvastatin 80 mg half a tablet by mouth daily. 7. Xanax 0.25 mg 1 tablet by mouth twice daily.  DISPOSITION AND FOLLOWUP:  [name] will need to follow her primary care physician for her chronic medical problems.  She will need to follow also with her cardiologist.  STUDIES PERFORMED:  Myoview showed no evidence of myocardial ischemia. Left ventricular function is normal with 20-30 calculated ejection fraction 60%.  Chest x-ray, no acute cardiopulmonary disease.  BRIEF HISTORY OF PRESENT ILLNESS:  This is an 77 year old with past medical history of diabetes, hypertension, prior admission for chest pain, presents complaining again with chest pain, sharp in quality.  The pain started again on the day prior to admission, pain sensation in her chest, complaining with some lightheadedness.  HOSPITAL COURSE:  Atypical chest pain probably secondary to GERD.  The patient was admitted to telemetry.  Cardiac enzymes x3 negative.  EKG, no significant changes. She had a Myoview that was negative.  The plan is to get a D-dimer.  If D-dimer is negative, the patient is going to be discharged home today.    Hartley Barefoot, MD    BR/MEDQ  D:  03/09/2011  T:  03/10/2011  Job:  [id number]  Electronically Signed by Hartley Barefoot MD on 03/13/2011 01:36:11 PM

## 2011-03-14 ENCOUNTER — Encounter (INDEPENDENT_AMBULATORY_CARE_PROVIDER_SITE_OTHER): Payer: Medicare Other | Admitting: Cardiology

## 2011-03-14 ENCOUNTER — Telehealth: Payer: Self-pay | Admitting: *Deleted

## 2011-03-14 DIAGNOSIS — E1159 Type 2 diabetes mellitus with other circulatory complications: Secondary | ICD-10-CM

## 2011-03-14 DIAGNOSIS — I739 Peripheral vascular disease, unspecified: Secondary | ICD-10-CM

## 2011-03-14 NOTE — Telephone Encounter (Signed)
Son is calling to ask Dr. Kirtland Bouchard why his Mom is having a burning in her chest, and sweats all night in bed, but complains of "freezing" when she gets up in the am.

## 2011-03-14 NOTE — Telephone Encounter (Signed)
Attempt to call son, lmtcb if questions - instructed on what dr. Amador Cunas said. KIK

## 2011-03-14 NOTE — Telephone Encounter (Signed)
Continue present medication. Hopefully these symptoms  will improve with time with her new medication

## 2011-03-15 ENCOUNTER — Telehealth: Payer: Self-pay | Admitting: *Deleted

## 2011-03-15 NOTE — Telephone Encounter (Addendum)
Son callling again stating pt is sweating at night and cold when she gets up.  Explained to him again that Dr. Kirtland Bouchard wants to give the medication some time to work, and hopefully, the anxiousness will get better.

## 2011-03-16 ENCOUNTER — Emergency Department (HOSPITAL_COMMUNITY)
Admission: EM | Admit: 2011-03-16 | Discharge: 2011-03-16 | Disposition: A | Discharge status: Home / Self Care | Payer: Medicare Other | Attending: Emergency Medicine | Admitting: Emergency Medicine

## 2011-03-16 DIAGNOSIS — R079 Chest pain, unspecified: Secondary | ICD-10-CM | POA: Insufficient documentation

## 2011-03-16 DIAGNOSIS — Z7982 Long term (current) use of aspirin: Secondary | ICD-10-CM | POA: Insufficient documentation

## 2011-03-16 DIAGNOSIS — Z79899 Other long term (current) drug therapy: Secondary | ICD-10-CM | POA: Insufficient documentation

## 2011-03-16 DIAGNOSIS — I10 Essential (primary) hypertension: Secondary | ICD-10-CM | POA: Insufficient documentation

## 2011-03-16 DIAGNOSIS — E785 Hyperlipidemia, unspecified: Secondary | ICD-10-CM | POA: Insufficient documentation

## 2011-03-16 DIAGNOSIS — E119 Type 2 diabetes mellitus without complications: Secondary | ICD-10-CM | POA: Insufficient documentation

## 2011-03-16 DIAGNOSIS — K219 Gastro-esophageal reflux disease without esophagitis: Secondary | ICD-10-CM | POA: Insufficient documentation

## 2011-03-16 DIAGNOSIS — R1013 Epigastric pain: Secondary | ICD-10-CM | POA: Insufficient documentation

## 2011-03-16 NOTE — Consult Note (Addendum)
[name], [name] NO.:  [id number]  MEDICAL RECORD NO.:  [id number]  LOCATION:  3703                         FACILITY:  [facility]  PHYSICIAN:  Rollene Rotunda, MD, FACCDATE OF BIRTH:  May 01, 1923  DATE OF CONSULTATION: DATE OF DISCHARGE:                                CONSULTATION   PRIMARY CARDIOLOGIST:  Verne Carrow, MD, saw the patient on February 20, 2011, for the first time.  PRIMARY MEDICAL DOCTOR:  Gordy Savers, MD  CHIEF COMPLAINT:  Chest pain.  HISTORY OF PRESENT ILLNESS:  Ms. Baray is an 77 year old female with a history of diabetes, hypertension, and hyperlipidemia, who has a history of chest pain, felt atypical, consistent with GERD in the past.  Her son states she had a remote stress test that was reportedly normal.  She saw Dr. Clifton James in the office on February 20, 2011, and was felt to have atypical symptoms with no further cardiac workup planned.  A 2-D echocardiogram in June demonstrated EF of 55-60% with no wall motion abnormality.  The patient has had on and off chest pain for approximately a month.  Yesterday while reading the paper, she developed a hurting sensation in her chest without radiation and felt somewhat lightheadedness.  She finds it difficult to characterize, but denies that it was sharp or particularly severe in nature, but the lightheadedness started and also felt somewhat short of breath.  She told her son about it and they subsequently began to drive up to the hospital.  Within 10 minutes of driving, her chest pain eased off spontaneously, but not completely.  The patient does not quite remember what went on once she arrived at the ER; however, her son states that once she got IV Protonix, the patient stated she felt much better. There may have also been some palpitations as well, although the patient states she cannot quite recall if this was the case.  Cardiac enzymes have been negative x2.  EKG does  demonstrate some nonspecific changes, but is similar to June 2012.  She currently denies any chest pain, but does occasionally feel as though it is difficult to take deep breaths. Taking deep breaths does not elicit any pain, nor is it brought on by eating, changes in position, or palpation.  PAST MEDICAL HISTORY: 1. Diabetes mellitus. 2. Hypertension. 3. Hyperlipidemia. 4. Chronic anxiety. 5. Atypical chest pain in the past, felt consistent with GERD with     remote stress test that was normal per son. 6. Anemia. 7. Kidney stone. 8. Bundle-branch block. 9. Status post hysterectomy. 10.Status post  cataract surgery. 11.Status post hemorrhoid surgery.  OUTPATIENT MEDICATIONS: 1. Sertraline 25 mg daily. 2. Xanax 0.25 mg b.i.d. p.r.n. 3. Metformin SR 500 mg nightly. 4. Zocor 40 mg nightly. 5. Omeprazole 20 mg b.i.d. 6. HCTZ 12.5 mg daily.  ALLERGIES: 1. AMOXICILLIN. 2. PENICILLIN.  SOCIAL HISTORY:  Ms. Spayd is widowed.  Her son lives with her and has lived with her for the past 6 years.  Her first husband died when her son was 47 and then she remarried, but he also passed away.  She has 2 sons and 1 daughter total.  She denies any tobacco, alcohol  use.  FAMILY HISTORY:  Positive for cancer in her father, sister, and brother. No known history of coronary artery disease in her family.  REVIEW OF SYSTEMS:  No fevers, chills, nausea or vomiting.  She has occasional lower extremity edema.  She had pain in her legs that she noted at the visit with Dr. Clifton James on February 20, 2011, but she also associated this with lower extremity edema.  She was subsequently started on HCTZ as an outpatient by her PCP with resolution of both pain and improvement in the swelling.  All other systems reviewed and otherwise negative.  LABORATORY DATA:  WBC 10.1, hemoglobin 11.7, hematocrit 35.2, platelet count 245.  Sodium 137, potassium 4.1, chloride 100, CO2 of 25, glucose 113, BUN 16,  creatinine 0.87.  LFTs were within normal limits with the exception of decreased albumin of 3.4.  A1c 6.4.  Cardiac enzymes negative x2.  Total cholesterol 161, triglycerides 230, HDL 57, LDL 58.  STUDIES: 1. Chest x-ray March 07, 2011, showed no evidence of acute     cardiopulmonary disease. 2. EKG, normal sinus rhythm at 70 beats per minute with a PVC.     Borderline short PR interval with T-wave inversions in II, III,     aVF, V4 and V5.  Right bundle-branch block and left anterior     fascicular block, similar to June 2012.  PHYSICAL EXAMINATION:  VITAL SIGNS:  Temperature 97.8, pulse 57, respirations 16, blood pressure 134/76, pulse ox 98% on room air. GENERAL:  This a pleasant elderly white female in no acute distress. HEENT:  Normocephalic, atraumatic.  Extraocular movements intact. Anicteric sclerae.  Nares without discharge. NECK:  Supple without carotid bruits. HEART:  Auscultation of the heart reveals regular rate and rhythm with S1 and S2 without murmurs, rubs, or gallops. LUNGS:  Clear to auscultation bilaterally without wheezes, rales, or rhonchi. ABDOMEN:  Soft, nontender, nondistended with positive bowel sounds.  No rebound or guarding. EXTREMITIES:  Warm, dry without clubbing or cyanosis.  There is trace nonpitting sock-line edema bilaterally. NEUROLOGIC:  She is alert and oriented x3, responds to questions appropriately with normal affect.  ASSESSMENT AND PLAN:  The patient was seen and examined by Dr. Antoine Poche and myself.  This is an 77 year old female with no known history of coronary artery disease with a history of diabetes, hypertension, and hyperlipidemia as well as atypical chest pain in the past felt consistent with gastroesophageal reflux disease.  She had a fairly unremarkable echocardiogram on February 09, 2011, and presented to [facility] with an episode of brief chest pain with both atypical and typical features.  Cardiac enzymes have remain  negative, and although EKG at baseline is somewhat changed, it is unchanged from June 2012. Her pain is not typically anginal, but we feel she may benefit from risk stratification with a Lexiscan scan Myoview which we will plan for in the morning.  She notes she cannot respire completely, but is not hypoxic or tachycardic.  However with trace lower extremity edema, it may be worthwhile to check a D-dimer if her nuclear study is unrevealing.  If her nuclear study is negative, no further cardiac workup will be warranted.  We also canceled the order for 2D echocardiogram as she just had one 1 month ago.  Thank you for the opportunity to participate in the care of this patient.  We will continue to follow with you.     Ronie Spies, P.A.C.   ______________________________ Rollene Rotunda, MD, [facility]  DD/MEDQ  D:  03/08/2011  T:  03/09/2011  Job:  [id number]  cc:   Verne Carrow, MD Gordy Savers, MD  Electronically Signed by Rollene Rotunda MD [facility] on 03/16/2011 04:03:23 PM Electronically Signed by Ronie Spies  on 03/18/2011 01:49:13 PM

## 2011-03-18 ENCOUNTER — Encounter: Payer: Self-pay | Admitting: Cardiovascular Disease

## 2011-03-18 ENCOUNTER — Telehealth: Payer: Self-pay | Admitting: Internal Medicine

## 2011-03-18 NOTE — Telephone Encounter (Signed)
YES:  continue omeprazole twice daily

## 2011-03-18 NOTE — Telephone Encounter (Signed)
Pt went to to ED dept at [facility] re: breathing diff. Pt has been dx with GERD and is wondering if this could cause pt not to be able to take deep breaths? Pls advise.

## 2011-03-18 NOTE — Telephone Encounter (Signed)
pts son is aware but wants to schedule an OV to discuss.  Appt scheduled

## 2011-03-19 ENCOUNTER — Ambulatory Visit (INDEPENDENT_AMBULATORY_CARE_PROVIDER_SITE_OTHER): Payer: Medicare Other | Admitting: Internal Medicine

## 2011-03-19 ENCOUNTER — Encounter: Payer: Self-pay | Admitting: Internal Medicine

## 2011-03-19 DIAGNOSIS — E119 Type 2 diabetes mellitus without complications: Secondary | ICD-10-CM

## 2011-03-19 DIAGNOSIS — I10 Essential (primary) hypertension: Secondary | ICD-10-CM

## 2011-03-19 DIAGNOSIS — K219 Gastro-esophageal reflux disease without esophagitis: Secondary | ICD-10-CM

## 2011-03-19 NOTE — Patient Instructions (Signed)
Call or return to clinic prn if these symptoms worsen or fail to improve as anticipated.

## 2011-03-19 NOTE — Progress Notes (Signed)
  Subjective:    Patient ID: [name], female    DOB: [birth year], 77 y.o.   MRN: [id number]  HPI  77 year old patient who is seen today for followup after a recent emergency room visit. She presented to a local urgent care complaining of burning epigastric pain and was referred to the ED. The patient has been hospitalized and has had full cardiac evaluation including nuclear medicine stress test. It is felt that gastrointestinal soft reflux disease may be a factor and clearly has some associated anxiety. Her chief complaint today is inability to take a full breath. She remains quite anxious    Review of Systems  Constitutional: Negative.   HENT: Negative for hearing loss, congestion, sore throat, rhinorrhea, dental problem, sinus pressure and tinnitus.   Eyes: Negative for pain, discharge and visual disturbance.  Respiratory: Positive for shortness of breath. Negative for cough.   Cardiovascular: Negative for chest pain, palpitations and leg swelling.  Gastrointestinal: Positive for abdominal pain. Negative for nausea, vomiting, diarrhea, constipation, blood in stool and abdominal distention.  Genitourinary: Negative for dysuria, urgency, frequency, hematuria, flank pain, vaginal bleeding, vaginal discharge, difficulty urinating, vaginal pain and pelvic pain.  Musculoskeletal: Negative for joint swelling, arthralgias and gait problem.  Skin: Negative for rash.  Neurological: Negative for dizziness, syncope, speech difficulty, weakness, numbness and headaches.  Hematological: Negative for adenopathy.  Psychiatric/Behavioral: Negative for behavioral problems, dysphoric mood and agitation. The patient is not nervous/anxious.        Objective:   Physical Exam  Constitutional: She is oriented to person, place, and time. She appears well-developed and well-nourished.       Elderly alert anxious. Blood pressure normal  HENT:  Head: Normocephalic.  Right Ear: External ear normal.  Left  Ear: External ear normal.  Mouth/Throat: Oropharynx is clear and moist.  Eyes: Conjunctivae and EOM are normal. Pupils are equal, round, and reactive to light.  Neck: Normal range of motion. Neck supple. No thyromegaly present.  Cardiovascular: Normal rate, regular rhythm, normal heart sounds and intact distal pulses.   Pulmonary/Chest: Effort normal and breath sounds normal.       O2 saturation 98% Pulse rate 76  Abdominal: Soft. Bowel sounds are normal. She exhibits no mass. There is no tenderness.  Musculoskeletal: Normal range of motion.  Lymphadenopathy:    She has no cervical adenopathy.  Neurological: She is alert and oriented to person, place, and time.  Skin: Skin is warm and dry. No rash noted.  Psychiatric: She has a normal mood and affect. Her behavior is normal.          Assessment & Plan:   Anxiety disorder and possibly symptomatic GERD. Have recommended when necessary antacid therapy in addition to her twice a day regimen of Prilosec. We'll continue 3 times a day alprazolam and take additional when necessary acute anxiety and shortness of breath. Hypertension stable

## 2011-03-21 ENCOUNTER — Telehealth: Payer: Self-pay

## 2011-03-21 DIAGNOSIS — K219 Gastro-esophageal reflux disease without esophagitis: Secondary | ICD-10-CM

## 2011-03-21 NOTE — Telephone Encounter (Signed)
Done

## 2011-03-21 NOTE — Telephone Encounter (Signed)
Please schedule GI referral

## 2011-03-21 NOTE — Telephone Encounter (Signed)
Pt's son would like for pt to have GI consult. He states that if her dx is truly GERD, then he would rather her go ahead and see a GI rather than have it put off for another week. He says it is tough watching his mom tell him that she can't breath and she feels like she is going to die.  Please advise

## 2011-03-26 ENCOUNTER — Encounter: Payer: Self-pay | Admitting: Internal Medicine

## 2011-03-26 ENCOUNTER — Ambulatory Visit (INDEPENDENT_AMBULATORY_CARE_PROVIDER_SITE_OTHER): Payer: Medicare Other | Admitting: Internal Medicine

## 2011-03-26 DIAGNOSIS — I10 Essential (primary) hypertension: Secondary | ICD-10-CM

## 2011-03-26 DIAGNOSIS — R0789 Other chest pain: Secondary | ICD-10-CM

## 2011-03-26 DIAGNOSIS — E119 Type 2 diabetes mellitus without complications: Secondary | ICD-10-CM

## 2011-03-26 DIAGNOSIS — F329 Major depressive disorder, single episode, unspecified: Secondary | ICD-10-CM

## 2011-03-26 NOTE — Progress Notes (Signed)
  Subjective:    Patient ID: [name], female    DOB: 15-Nov-1922, 77 y.o.   MRN: [id number]  HPI  77 year old patient who is seen today for followup. Complaints today include the knee pain and also an inability to get warm.  She requires additional clothing in spite of fairly warm ambient temperature. She continues to have mild substernal chest pain with this and shortness of breath seems less of a concern today. Her sertraline was up titrated to 50 mg and remains on 3 times a day alprazolam. Clinically she seems to be improved;  at the son's request she is to be evaluated by GI in 2 weeks. Fortunately she has had no further ED visits    Review of Systems  Constitutional: Negative.   HENT: Negative for hearing loss, congestion, sore throat, rhinorrhea, dental problem, sinus pressure and tinnitus.   Eyes: Negative for pain, discharge and visual disturbance.  Respiratory: Positive for shortness of breath. Negative for cough.   Cardiovascular: Positive for chest pain. Negative for palpitations and leg swelling.  Gastrointestinal: Negative for nausea, vomiting, abdominal pain, diarrhea, constipation, blood in stool and abdominal distention.  Genitourinary: Negative for dysuria, urgency, frequency, hematuria, flank pain, vaginal bleeding, vaginal discharge, difficulty urinating, vaginal pain and pelvic pain.  Musculoskeletal: Negative for joint swelling, arthralgias and gait problem.       Knee pain  Skin: Negative for rash.  Neurological: Negative for dizziness, syncope, speech difficulty, weakness, numbness and headaches.  Hematological: Negative for adenopathy.  Psychiatric/Behavioral: Negative for behavioral problems, dysphoric mood and agitation. The patient is nervous/anxious.        Objective:   Physical Exam  Constitutional: She is oriented to person, place, and time. She appears well-developed and well-nourished.  HENT:  Head: Normocephalic.  Right Ear: External ear normal.    Left Ear: External ear normal.  Mouth/Throat: Oropharynx is clear and moist.  Eyes: Conjunctivae and EOM are normal. Pupils are equal, round, and reactive to light.  Neck: Normal range of motion. Neck supple. No thyromegaly present.  Cardiovascular: Normal rate, regular rhythm, normal heart sounds and intact distal pulses.   Pulmonary/Chest: Effort normal and breath sounds normal.       O2 saturation 97 Pulse 67  Abdominal: Soft. Bowel sounds are normal. She exhibits no mass. There is no tenderness.  Musculoskeletal: Normal range of motion.       +1 ankle edema  Lymphadenopathy:    She has no cervical adenopathy.  Neurological: She is alert and oriented to person, place, and time.  Skin: Skin is warm and dry. No rash noted.  Psychiatric: She has a normal mood and affect. Her behavior is normal.          Assessment & Plan:   Anxiety disorder Atypical chest pain Bilateral knee pain  GI followup as scheduled Return here in 2 months for followup No change in therapy Patient was reassured

## 2011-03-26 NOTE — Patient Instructions (Signed)
GI evaluation as scheduled  Limit your sodium (Salt) intake    It is important that you exercise regularly, at least 20 minutes 3 to 4 times per week.  If you develop chest pain or shortness of breath seek  medical attention.

## 2011-03-27 ENCOUNTER — Telehealth: Payer: Self-pay | Admitting: *Deleted

## 2011-03-27 NOTE — Telephone Encounter (Signed)
Left message on pt's voice-mail.

## 2011-03-27 NOTE — Telephone Encounter (Signed)
Pt is still complaining of being cold....is it ok if pt increases Xanax to 5x daily?

## 2011-03-27 NOTE — Telephone Encounter (Signed)
OK evert 4 hours prn

## 2011-04-01 ENCOUNTER — Telehealth: Payer: Self-pay | Admitting: *Deleted

## 2011-04-01 MED ORDER — METOCLOPRAMIDE HCL 5 MG PO TABS: 5.0000 mg | ORAL_TABLET | Freq: Three times a day (TID) | ORAL | Status: AC

## 2011-04-01 NOTE — Telephone Encounter (Signed)
Please advise 

## 2011-04-01 NOTE — Telephone Encounter (Signed)
rx sent in, pts son aware

## 2011-04-01 NOTE — Telephone Encounter (Signed)
Please call in a prescription for metoclopramide 5 mg #100 to take 30 minutes prior to each meal 3 times daily

## 2011-04-01 NOTE — Telephone Encounter (Signed)
Pt is having problems with GERD.  Son is upset that nothing is being done.  Prilosec is not helping.  Dr Kirtland Bouchard is treating the anxiety and not the GERD.  He wants something done today. No need to call back son just wants something in.  [facility] [place]

## 2011-04-09 ENCOUNTER — Telehealth: Payer: Self-pay | Admitting: Gastroenterology

## 2011-04-09 NOTE — Telephone Encounter (Signed)
Pts son is calling requesting a sooner appt for his mother. States his mother is having problems with a lot of burning and reflux. Thinks his mother may have a hiatal hernia. Pt scheduled to see Willette Cluster NP 04/11/11@2 :30pm. Pt aware of appt date and time.

## 2011-04-11 ENCOUNTER — Encounter: Payer: Self-pay | Admitting: Nurse Practitioner

## 2011-04-11 ENCOUNTER — Ambulatory Visit (INDEPENDENT_AMBULATORY_CARE_PROVIDER_SITE_OTHER): Payer: Medicare Other | Admitting: Nurse Practitioner

## 2011-04-11 VITALS — BP 134/60 | HR 68 | Ht 63.0 in | Wt 150.4 lb

## 2011-04-11 DIAGNOSIS — R1013 Epigastric pain: Secondary | ICD-10-CM | POA: Insufficient documentation

## 2011-04-11 MED ORDER — SUCRALFATE 1 GM/10ML PO SUSP: 1.0000 g | Freq: Three times a day (TID) | ORAL | Status: DC

## 2011-04-11 NOTE — Assessment & Plan Note (Addendum)
Nonradiating epigastric pain unrelated to meals. PUD seems unlikely in setting of chronic high dose PPI. Since her pain has responded to Reglan and Maalox I doubt this is biliary in nature. Ideally patient would not be on Reglan long-term.  Trial of Carafate suspension AC and HS. Discontinue Reglan. Continue twice daily PPI.  If patient has recurrent symptoms we will obtain an abdominal ultrasound. Depending on clinical course she may ultimately require upper endoscopy. Follow up with Dr. Arlyce Dice in 3-4 weeks.

## 2011-04-11 NOTE — Progress Notes (Signed)
[name] [id number] 01-07-1923   HISTORY OR PRESENT ILLNESS : [name] is an 77 year old female who was evaluated by Dr. Arlyce Dice in September 2011 for anemia, weight loss and abdominal pain. She had a normal EGD in October 2011. Her colonoscopy was pertinent for severe diverticulosis  Following endoscopic workup a CT scan of the abdomen and pelvis with IV contrast was done and it was unremarkable. Patient comes in today for evaluation of reflux. Complains of a  "knot" in distal sternum, has problems taking a deep breath sometimes. Son accompanies her today and helps with the history. Per the son, patient sometimes complains of chest and epigastric burning unrelated to meals.  No dysphagia. [name] was admitted to the hospital June 9 with atypical chest pain. Her EKG was negative for any significant changes, cardiac enzymes were negative. CXR was negative for acute abnormalities. She was readmitted in July for chest pain, a Myoview was negative for ischemia. Her pain was ultimately felt to be secondary to GERD. Patient gives a longstanding history of GERD for which she has taken twice daily Prilosec for years. Since hospital discharge patient has been taking Maalox 2-3 times a day. She was also recently started on Reglan before meals. Her pain, which she describes as epigastric, has improved. Her main concern is inability to take a deep breath sometimes.    Current Medications, Allergies, Past Medical History, Past Surgical History, Family History and Social History were reviewed in [employer] record.   PHYSICAL EXAMINATION : General: Well developed  female in no acute distress Head: Normocephalic and atraumatic Eyes:  sclerae anicteric,conjunctive pink. Ears: Normal auditory acuity Mouth: No deformity or lesions Neck: Supple, no masses.  Lungs: Clear throughout to auscultation Heart: Regular rate and rhythm. Abdomen: Soft,nondistended. Mild epigastric  tenderness. No masses or hepatomegaly noted. Normal bowel sounds Rectal: not done Musculoskeletal: Symmetrical with no gross deformities  Skin: No lesions on visible extremities Extremities: No edema or deformities noted Neurological: Alert oriented x 4, grossly nonfocal Cervical Nodes:  No significant cervical adenopathy Psychological:  Alert and cooperative. Normal mood and affect  ASSESSMENT AND PLAN :

## 2011-04-11 NOTE — Patient Instructions (Signed)
Stop the Reglan ( Metoclopramide).  Continue the Prilosec twice daily. 30 min before breakfast and dinner. We have given you a prescription for Carafate to take to your pharmacy.. We have given you reflux literature. Keep the appointment to see Dr. Arlyce Dice on 05-10-2011.

## 2011-04-12 ENCOUNTER — Encounter: Payer: Self-pay | Admitting: Nurse Practitioner

## 2011-04-15 NOTE — Progress Notes (Signed)
Reviewed and agree DB 

## 2011-04-16 ENCOUNTER — Telehealth: Payer: Self-pay | Admitting: *Deleted

## 2011-04-16 NOTE — Telephone Encounter (Signed)
Pt is a little more dizzy today than usual and thinks it is her nerves.  Can she take her Xanax more than tid?

## 2011-04-16 NOTE — Telephone Encounter (Signed)
Every 6 hours prn not to exceed 4 per day

## 2011-04-16 NOTE — Telephone Encounter (Signed)
Notified pts son

## 2011-04-18 ENCOUNTER — Telehealth: Payer: Self-pay | Admitting: *Deleted

## 2011-04-18 NOTE — Telephone Encounter (Signed)
Son calls stating his Mom is having trouble breathing, and he does not know what to do.  Advised ER if she REALLY could not breath.  Spoke to the pt on the phone, and it sounded like she was hyperventilating, and speaking extremely fast, and very anxious.  Talked to her to calm down, and how to control her breathing. By the end of the conversation, and an appt was made with Dr. Kirtland Bouchard, she sounded much better.  Made clear to her son that if she plainly gets to the point she is having difficulty breathing, he will have to go to the ER after hours.  He feels frustrated how she can be fine one minute, and so upset, and anxious with all these complaints the next.  They say they were told by Dr. Kirtland Bouchard going to the ER was not the answer, so she was happy to get an appt tomorrow with Dr. Kirtland Bouchard.  Pt was clearly anxious on the phone, and did calm down as we talked.

## 2011-04-19 ENCOUNTER — Ambulatory Visit (INDEPENDENT_AMBULATORY_CARE_PROVIDER_SITE_OTHER): Payer: Medicare Other | Admitting: Internal Medicine

## 2011-04-19 ENCOUNTER — Encounter: Payer: Self-pay | Admitting: Internal Medicine

## 2011-04-19 VITALS — BP 130/70 | Temp 98.7°F | Wt 153.0 lb

## 2011-04-19 DIAGNOSIS — F410 Panic disorder [episodic paroxysmal anxiety] without agoraphobia: Secondary | ICD-10-CM

## 2011-04-19 DIAGNOSIS — R52 Pain, unspecified: Secondary | ICD-10-CM

## 2011-04-19 MED ORDER — SERTRALINE HCL 100 MG PO TABS: 100.0000 mg | ORAL_TABLET | Freq: Every day | ORAL | Status: DC

## 2011-04-19 NOTE — Progress Notes (Signed)
  Subjective:    Patient ID: [name], female    DOB: 1923-07-07, 77 y.o.   MRN: [id number]  HPI  77 year old patient who is seen today in followup. She has a history of suspected panic disorder and her sertraline has been up titrated to 50 mg daily she also takes alprazolam 3 or 4 times daily. She continues to have rather severe panic attacks with extreme anxiety hyperventilation chest pain associated with a sense of impending them. She is quite frustrated as is her son. Today she also complains of bilateral lower extremity pain distal to the knees. She's had recent GI evaluation due to epigastric pain.    Review of Systems  Constitutional: Negative.   HENT: Negative for hearing loss, congestion, sore throat, rhinorrhea, dental problem, sinus pressure and tinnitus.   Eyes: Negative for pain, discharge and visual disturbance.  Respiratory: Negative for cough and shortness of breath.   Cardiovascular: Negative for chest pain, palpitations and leg swelling.  Gastrointestinal: Negative for nausea, vomiting, abdominal pain, diarrhea, constipation, blood in stool and abdominal distention.  Genitourinary: Negative for dysuria, urgency, frequency, hematuria, flank pain, vaginal bleeding, vaginal discharge, difficulty urinating, vaginal pain and pelvic pain.  Musculoskeletal: Negative for joint swelling, arthralgias and gait problem.  Skin: Negative for rash.  Neurological: Negative for dizziness, syncope, speech difficulty, weakness, numbness and headaches.  Hematological: Negative for adenopathy.  Psychiatric/Behavioral: Positive for behavioral problems and dysphoric mood. Negative for agitation. The patient is nervous/anxious.        Objective:   Physical Exam  Constitutional: She is oriented to person, place, and time. She appears well-developed and well-nourished.  HENT:  Head: Normocephalic.  Right Ear: External ear normal.  Left Ear: External ear normal.  Mouth/Throat: Oropharynx  is clear and moist.  Eyes: Conjunctivae and EOM are normal. Pupils are equal, round, and reactive to light.  Neck: Normal range of motion. Neck supple. No thyromegaly present.  Cardiovascular: Normal rate, regular rhythm, normal heart sounds and intact distal pulses.   Pulmonary/Chest: Effort normal and breath sounds normal.  Abdominal: Soft. Bowel sounds are normal. She exhibits no mass. There is no tenderness.  Musculoskeletal: Normal range of motion.  Lymphadenopathy:    She has no cervical adenopathy.  Neurological: She is alert and oriented to person, place, and time.  Skin: Skin is warm and dry. No rash noted.  Psychiatric: She has a normal mood and affect. Her behavior is normal.       Anxious and at times tearful          Assessment & Plan:   Panic disorder. We'll increase her sertraline to 100 mg daily. She'll continue to take alprazolam 3 times a day. We'll set up for psychiatric followup Hypertension stable Type 2 diabetes

## 2011-04-19 NOTE — Patient Instructions (Signed)
Psychiatric referral as discussed  Return here as scheduled for followup next month  Increase sertraline to 100 mg daily

## 2011-04-19 NOTE — Telephone Encounter (Signed)
Dr. Kirtland Bouchard aware and will see the pt today in the office.

## 2011-04-22 ENCOUNTER — Emergency Department (HOSPITAL_COMMUNITY)
Admission: EM | Admit: 2011-04-22 | Discharge: 2011-04-23 | Disposition: A | Discharge status: Home / Self Care | Payer: Medicare Other | Attending: Emergency Medicine | Admitting: Emergency Medicine

## 2011-04-22 ENCOUNTER — Emergency Department (HOSPITAL_COMMUNITY): Payer: Medicare Other

## 2011-04-22 DIAGNOSIS — F29 Unspecified psychosis not due to a substance or known physiological condition: Secondary | ICD-10-CM | POA: Insufficient documentation

## 2011-04-22 DIAGNOSIS — I10 Essential (primary) hypertension: Secondary | ICD-10-CM | POA: Insufficient documentation

## 2011-04-22 DIAGNOSIS — K5900 Constipation, unspecified: Secondary | ICD-10-CM | POA: Insufficient documentation

## 2011-04-22 DIAGNOSIS — E119 Type 2 diabetes mellitus without complications: Secondary | ICD-10-CM | POA: Insufficient documentation

## 2011-04-22 DIAGNOSIS — R109 Unspecified abdominal pain: Secondary | ICD-10-CM | POA: Insufficient documentation

## 2011-04-22 DIAGNOSIS — F411 Generalized anxiety disorder: Secondary | ICD-10-CM | POA: Insufficient documentation

## 2011-04-22 LAB — DIFFERENTIAL
Basophils Relative: 0 % (ref 0–1)
Eosinophils Absolute: 0.1 10*3/uL (ref 0.0–0.7)
Eosinophils Relative: 1 % (ref 0–5)
Monocytes Absolute: 0.7 10*3/uL (ref 0.1–1.0)
Monocytes Relative: 6 % (ref 3–12)

## 2011-04-22 LAB — URINALYSIS, ROUTINE W REFLEX MICROSCOPIC
Glucose, UA: NEGATIVE mg/dL
Nitrite: NEGATIVE
Specific Gravity, Urine: 1.019 (ref 1.005–1.030)
pH: 8.5 — ABNORMAL HIGH (ref 5.0–8.0)

## 2011-04-22 LAB — COMPREHENSIVE METABOLIC PANEL
ALT: 9 U/L (ref 0–35)
Albumin: 3.6 g/dL (ref 3.5–5.2)
Calcium: 9.4 mg/dL (ref 8.4–10.5)
GFR calc Af Amer: >60 mL/min (ref >60)
Glucose, Bld: 100 mg/dL — ABNORMAL HIGH (ref 70–99)
Sodium: 137 mEq/L (ref 135–145)
Total Protein: 7.1 g/dL (ref 6.0–8.3)

## 2011-04-22 LAB — CBC
Hemoglobin: 11.3 g/dL — ABNORMAL LOW (ref 12.0–15.0)
MCH: 27.9 pg (ref 26.0–34.0)
MCHC: 33.1 g/dL (ref 30.0–36.0)
Platelets: 286 10*3/uL (ref 150–400)
RDW: 14.4 % (ref 11.5–15.5)

## 2011-04-22 LAB — URINE MICROSCOPIC-ADD ON

## 2011-04-24 ENCOUNTER — Telehealth: Payer: Self-pay

## 2011-04-24 NOTE — Telephone Encounter (Signed)
Pt seen in ED on 04/23/11 for anxiety and constipation. Son called to schedule follow up appointment. Appointment scheduled

## 2011-04-25 ENCOUNTER — Emergency Department (HOSPITAL_COMMUNITY): Payer: Medicare Other

## 2011-04-25 ENCOUNTER — Emergency Department (HOSPITAL_COMMUNITY)
Admission: EM | Admit: 2011-04-25 | Discharge: 2011-04-26 | Disposition: A | Discharge status: Home / Self Care | Payer: Medicare Other | Attending: Emergency Medicine | Admitting: Emergency Medicine

## 2011-04-25 DIAGNOSIS — R1013 Epigastric pain: Secondary | ICD-10-CM | POA: Insufficient documentation

## 2011-04-25 DIAGNOSIS — I452 Bifascicular block: Secondary | ICD-10-CM | POA: Insufficient documentation

## 2011-04-25 DIAGNOSIS — R0789 Other chest pain: Secondary | ICD-10-CM | POA: Insufficient documentation

## 2011-04-25 DIAGNOSIS — I498 Other specified cardiac arrhythmias: Secondary | ICD-10-CM | POA: Insufficient documentation

## 2011-04-25 DIAGNOSIS — K219 Gastro-esophageal reflux disease without esophagitis: Secondary | ICD-10-CM | POA: Insufficient documentation

## 2011-04-25 DIAGNOSIS — E119 Type 2 diabetes mellitus without complications: Secondary | ICD-10-CM | POA: Insufficient documentation

## 2011-04-25 DIAGNOSIS — F411 Generalized anxiety disorder: Secondary | ICD-10-CM | POA: Insufficient documentation

## 2011-04-25 DIAGNOSIS — E785 Hyperlipidemia, unspecified: Secondary | ICD-10-CM | POA: Insufficient documentation

## 2011-04-25 DIAGNOSIS — I10 Essential (primary) hypertension: Secondary | ICD-10-CM | POA: Insufficient documentation

## 2011-04-25 LAB — DIFFERENTIAL
Basophils Absolute: 0 10*3/uL (ref 0.0–0.1)
Basophils Relative: 0 % (ref 0–1)
Eosinophils Absolute: 0.1 10*3/uL (ref 0.0–0.7)
Eosinophils Relative: 1 % (ref 0–5)
Monocytes Absolute: 0.8 10*3/uL (ref 0.1–1.0)
Monocytes Relative: 7 % (ref 3–12)
Neutro Abs: 7.5 10*3/uL (ref 1.7–7.7)

## 2011-04-25 LAB — CBC
MCH: 28.1 pg (ref 26.0–34.0)
MCHC: 33.5 g/dL (ref 30.0–36.0)
Platelets: 283 10*3/uL (ref 150–400)
RDW: 14.3 % (ref 11.5–15.5)

## 2011-04-26 LAB — COMPREHENSIVE METABOLIC PANEL WITH GFR
ALT: 9 U/L (ref 0–35)
AST: 11 U/L (ref 0–37)
Albumin: 3.8 g/dL (ref 3.5–5.2)
Alkaline Phosphatase: 41 U/L (ref 39–117)
BUN: 13 mg/dL (ref 6–23)
CO2: 27 meq/L (ref 19–32)
Calcium: 9.6 mg/dL (ref 8.4–10.5)
Chloride: 104 meq/L (ref 96–112)
Creatinine, Ser: 0.75 mg/dL (ref 0.50–1.10)
GFR calc Af Amer: >60 mL/min
GFR calc non Af Amer: >60 mL/min
Glucose, Bld: 104 mg/dL — ABNORMAL HIGH (ref 70–99)
Potassium: 3.2 meq/L — ABNORMAL LOW (ref 3.5–5.1)
Sodium: 141 meq/L (ref 135–145)
Total Bilirubin: 0.5 mg/dL (ref 0.3–1.2)
Total Protein: 7 g/dL (ref 6.0–8.3)

## 2011-05-01 ENCOUNTER — Emergency Department (HOSPITAL_COMMUNITY)
Admission: EM | Admit: 2011-05-01 | Discharge: 2011-05-01 | Disposition: A | Discharge status: Home / Self Care | Payer: Medicare Other | Attending: Emergency Medicine | Admitting: Emergency Medicine

## 2011-05-01 ENCOUNTER — Telehealth: Payer: Self-pay | Admitting: Internal Medicine

## 2011-05-01 DIAGNOSIS — E785 Hyperlipidemia, unspecified: Secondary | ICD-10-CM | POA: Insufficient documentation

## 2011-05-01 DIAGNOSIS — E119 Type 2 diabetes mellitus without complications: Secondary | ICD-10-CM | POA: Insufficient documentation

## 2011-05-01 DIAGNOSIS — K219 Gastro-esophageal reflux disease without esophagitis: Secondary | ICD-10-CM | POA: Insufficient documentation

## 2011-05-01 DIAGNOSIS — R61 Generalized hyperhidrosis: Secondary | ICD-10-CM | POA: Insufficient documentation

## 2011-05-01 DIAGNOSIS — F341 Dysthymic disorder: Secondary | ICD-10-CM | POA: Insufficient documentation

## 2011-05-01 DIAGNOSIS — R079 Chest pain, unspecified: Secondary | ICD-10-CM | POA: Insufficient documentation

## 2011-05-01 DIAGNOSIS — I10 Essential (primary) hypertension: Secondary | ICD-10-CM | POA: Insufficient documentation

## 2011-05-01 DIAGNOSIS — Z79899 Other long term (current) drug therapy: Secondary | ICD-10-CM | POA: Insufficient documentation

## 2011-05-01 LAB — TROPONIN I: Troponin I: <0.3 ng/mL

## 2011-05-01 NOTE — Telephone Encounter (Signed)
Spoke with son - keep appt tomorrow - if bad at night To ER

## 2011-05-01 NOTE — Telephone Encounter (Signed)
Spoke with son - [name] - mom is having SOB , thinking she is dieing, has become hard to manage at home - yet when they go to ER or come in here , she seems to be stable. Wanting to know what can be done? I explained that all we can do is eval sx when here, and treat as indicated. He states he has checked out [facility] and the earliest they can take her is Oct 1 - that is too far away - any suggestions?

## 2011-05-01 NOTE — Telephone Encounter (Signed)
Pt son is thinking of putting pt in hospital. Pt has become obsessed with the idea she is going to die but is physically healthy. Pt son is very worried about his mother said they had a horrible night and is requesting to be contacted asap to make sure putting his mother in this hospital would be the best thing for her. [name]

## 2011-05-02 ENCOUNTER — Ambulatory Visit (INDEPENDENT_AMBULATORY_CARE_PROVIDER_SITE_OTHER): Payer: Medicare Other | Admitting: Internal Medicine

## 2011-05-02 ENCOUNTER — Encounter: Payer: Self-pay | Admitting: Internal Medicine

## 2011-05-02 DIAGNOSIS — R0789 Other chest pain: Secondary | ICD-10-CM

## 2011-05-02 DIAGNOSIS — E119 Type 2 diabetes mellitus without complications: Secondary | ICD-10-CM

## 2011-05-02 DIAGNOSIS — R52 Pain, unspecified: Secondary | ICD-10-CM

## 2011-05-02 MED ORDER — LORAZEPAM 1 MG PO TABS: 1.0000 mg | ORAL_TABLET | Freq: Three times a day (TID) | ORAL | Status: DC | PRN

## 2011-05-02 NOTE — Progress Notes (Signed)
  Subjective:    Patient ID: [name], female    DOB: Mar 19, 1923, 77 y.o.   MRN: [id number]  HPI  77 year old patient who is seen today in followup. She has a history of anxiety disorder atypical chest pain and type 2 diabetes. She has been on sertraline and alprazolam. She became quite anxious yesterday and was seen in the ED. She apparently was given lorazepam IV and today feels quite well she apparently tolerated this medication without much difficulty. She has been referred for psychiatric assessment and treatment    Review of Systems  Constitutional: Negative.   HENT: Negative for hearing loss, congestion, sore throat, rhinorrhea, dental problem, sinus pressure and tinnitus.   Eyes: Negative for pain, discharge and visual disturbance.  Respiratory: Negative for cough and shortness of breath.   Cardiovascular: Positive for chest pain. Negative for palpitations and leg swelling.  Gastrointestinal: Negative for nausea, vomiting, abdominal pain, diarrhea, constipation, blood in stool and abdominal distention.  Genitourinary: Negative for dysuria, urgency, frequency, hematuria, flank pain, vaginal bleeding, vaginal discharge, difficulty urinating, vaginal pain and pelvic pain.  Musculoskeletal: Positive for arthralgias and gait problem. Negative for joint swelling.  Skin: Negative for rash.  Neurological: Negative for dizziness, syncope, speech difficulty, weakness, numbness and headaches.  Hematological: Negative for adenopathy.  Psychiatric/Behavioral: Negative for behavioral problems, dysphoric mood and agitation. The patient is nervous/anxious.        Objective:   Physical Exam  Constitutional: She is oriented to person, place, and time. She appears well-developed and well-nourished.       Elderly. No acute distress. Appears slightly anxious. Tearful at times when she discusses her daughter. Blood pressure 120/70. Random blood sugar 106  HENT:  Head: Normocephalic.  Right Ear:  External ear normal.  Left Ear: External ear normal.  Mouth/Throat: Oropharynx is clear and moist.  Eyes: Conjunctivae and EOM are normal. Pupils are equal, round, and reactive to light.  Neck: Normal range of motion. Neck supple. No thyromegaly present.  Cardiovascular: Normal rate, regular rhythm, normal heart sounds and intact distal pulses.   Pulmonary/Chest: Effort normal and breath sounds normal.  Abdominal: Soft. Bowel sounds are normal. She exhibits no mass. There is no tenderness.  Musculoskeletal: Normal range of motion.  Lymphadenopathy:    She has no cervical adenopathy.  Neurological: She is alert and oriented to person, place, and time.  Skin: Skin is warm and dry. No rash noted.  Psychiatric: She has a normal mood and affect. Her behavior is normal.          Assessment & Plan:   Anxiety/panic disorder Atypical chest pain Diabetes mellitus  We'll continue on Ativan 1 mg 3 times a day as needed. Patient is accompanied by her son. We'll decrease the lorazepam to one half tablet if she becomes oversedated Return in one month for followup

## 2011-05-02 NOTE — Patient Instructions (Signed)
Limit your sodium (Salt) intake    It is important that you exercise regularly, at least 20 minutes 3 to 4 times per week.  If you develop chest pain or shortness of breath seek  medical attention.  Return in one month for follow-up  

## 2011-05-07 ENCOUNTER — Telehealth: Payer: Self-pay | Admitting: *Deleted

## 2011-05-07 ENCOUNTER — Telehealth: Payer: Self-pay | Admitting: Internal Medicine

## 2011-05-07 NOTE — Telephone Encounter (Signed)
Pts son called and is wondering who he would need to contact to get his mother part-time in home care to sit with the pt while son is away. Pts son needs to know if this requires a doctors order to be able to get a nurse part time? Also pt is having night sweats and says that she is freezing and is having diff breathing and is afraid of being alone.

## 2011-05-07 NOTE — Telephone Encounter (Signed)
Please see if patient is now willing to be seen by behavioral health. When seen last week the patient felt improved and she did not want to be seen by psychiatry. If she is agreeable please schedule for psychiatric consultation

## 2011-05-07 NOTE — Telephone Encounter (Signed)
Have patient check with [facility] to see if this service is covered and I will order if covered

## 2011-05-07 NOTE — Telephone Encounter (Signed)
Pt's son given recommendations.

## 2011-05-07 NOTE — Telephone Encounter (Signed)
Son calls back and thinks his Mom will see someone in Behavior Health but has a lot of questions about cost, and length of weight.  Will talk to Terri and let us know.

## 2011-05-07 NOTE — Telephone Encounter (Signed)
LMTCB

## 2011-05-07 NOTE — Telephone Encounter (Signed)
Pt is complains that she cannot get a deep breath again, and the Ativan is not working. ???

## 2011-05-09 ENCOUNTER — Telehealth: Payer: Self-pay | Admitting: *Deleted

## 2011-05-09 NOTE — Telephone Encounter (Signed)
No BM X 3 days.  Son has used stool softners with no help.

## 2011-05-09 NOTE — Telephone Encounter (Signed)
Over-the-counter MiraLax (polyethylene glycol) 17 GMS (1 scoop) in 8 ounces of water once or twice daily as needed

## 2011-05-10 ENCOUNTER — Encounter: Payer: Self-pay | Admitting: Gastroenterology

## 2011-05-10 ENCOUNTER — Ambulatory Visit (INDEPENDENT_AMBULATORY_CARE_PROVIDER_SITE_OTHER): Payer: Medicare Other | Admitting: Gastroenterology

## 2011-05-10 DIAGNOSIS — F329 Major depressive disorder, single episode, unspecified: Secondary | ICD-10-CM

## 2011-05-10 DIAGNOSIS — R0789 Other chest pain: Secondary | ICD-10-CM

## 2011-05-10 NOTE — Assessment & Plan Note (Signed)
She was recently started on Zoloft. This apparently is an active issue. She'll followup with Dr. Frederica Kuster

## 2011-05-10 NOTE — Telephone Encounter (Signed)
Notified son.

## 2011-05-10 NOTE — Patient Instructions (Signed)
Your Endoscopy is scheduled on Monday 05/13/2011 at 3:30pm You are scheduled for a Gastric Emptying Scan on 05/30/2011 at 8am at [facility] Nothing to eat or drink after midnight

## 2011-05-10 NOTE — Progress Notes (Signed)
History of Present Illness:  [name] has returned today with her son for followup of chest pain. She claims that she continues to have burning chest discomfort has not changed since the introduction of  Carafate.  She has the sensation of something in her lower chest that she would like to belch out. She denies dysphagia, per se. She is visibly swallowing and then regurgitating air  as I talk with her in the exam room. Her son clearly states that she is terribly depressed and anxious.    Review of Systems: Pertinent positive and negative review of systems were noted in the above HPI section. All other review of systems were otherwise negative.    Current Medications, Allergies, Past Medical History, Past Surgical History, Family History and Social History were reviewed in [employer] electronic medical record  Vital signs were reviewed in today's medical record. Physical Exam: General: Well developed , well nourished, no acute distress

## 2011-05-10 NOTE — Assessment & Plan Note (Signed)
Chest pain may be related to her GERD. I believe there is an anxiety component as well. Persistence of symptoms despite medicines raises the question of a gastric or esophageal lesion.  Recommendation #1 upper endoscopy #2 gastric emptying scan

## 2011-05-13 ENCOUNTER — Telehealth: Payer: Self-pay | Admitting: *Deleted

## 2011-05-13 ENCOUNTER — Ambulatory Visit (AMBULATORY_SURGERY_CENTER): Payer: Medicare Other | Admitting: Gastroenterology

## 2011-05-13 ENCOUNTER — Telehealth: Payer: Self-pay | Admitting: Gastroenterology

## 2011-05-13 VITALS — BP 142/81 | HR 54 | Temp 99.5°F | Resp 18 | Ht 63.0 in | Wt 148.0 lb

## 2011-05-13 DIAGNOSIS — R0789 Other chest pain: Secondary | ICD-10-CM

## 2011-05-13 HISTORY — PX: ESOPHAGOGASTRODUODENOSCOPY: SHX1529

## 2011-05-13 LAB — GLUCOSE, CAPILLARY: Glucose-Capillary: 96 mg/dL (ref 70–99)

## 2011-05-13 MED ORDER — SODIUM CHLORIDE 0.9 % IV SOLN: 500.0000 mL | INTRAVENOUS | Status: DC

## 2011-05-13 NOTE — Patient Instructions (Signed)
See the picture page for your findings from your exam today.  Follow the green and blue discharge instruction sheets the rest of the day.  Resume your prior medications today.   The third floor nurse will call you to set up a gastric empting scan which is a test done in radiology.  This test will give an idea of how well your stomach is functioning.  Please call if any questions or concerns.

## 2011-05-13 NOTE — Progress Notes (Signed)
I dressed the pt in the recovery room.  No complaints noted.  Glasses and denture intact. maw

## 2011-05-13 NOTE — Telephone Encounter (Signed)
Son stated that patient was nauseated while drinking sprite most of this am.    Patient is 77 yrs old, and I told the son to just give her sips of sprite or water only if she was thirsty.   I told him that we would put in an IV when she arrives at 2:30pm for her endoscopy.   That would prevent her from getting dehydrated.   Since she only got nauseated when she drank,   No medication was necessary at this time.

## 2011-05-13 NOTE — Progress Notes (Signed)
DIXIE DOSS R.N. AT BEDSIDE TIME OUT COMPLETED WITH DIXIE, Charlies Rayburn , NORCHEL AND DR. KAPLAN.

## 2011-05-14 ENCOUNTER — Other Ambulatory Visit: Payer: Self-pay | Admitting: Gastroenterology

## 2011-05-14 ENCOUNTER — Emergency Department (HOSPITAL_COMMUNITY)
Admission: EM | Admit: 2011-05-14 | Discharge: 2011-05-14 | Disposition: A | Discharge status: Home / Self Care | Payer: Medicare Other | Attending: Emergency Medicine | Admitting: Emergency Medicine

## 2011-05-14 ENCOUNTER — Telehealth: Payer: Self-pay | Admitting: Internal Medicine

## 2011-05-14 ENCOUNTER — Emergency Department (HOSPITAL_COMMUNITY): Payer: Medicare Other

## 2011-05-14 ENCOUNTER — Telehealth: Payer: Self-pay | Admitting: *Deleted

## 2011-05-14 DIAGNOSIS — F411 Generalized anxiety disorder: Secondary | ICD-10-CM | POA: Insufficient documentation

## 2011-05-14 DIAGNOSIS — F410 Panic disorder [episodic paroxysmal anxiety] without agoraphobia: Secondary | ICD-10-CM | POA: Insufficient documentation

## 2011-05-14 DIAGNOSIS — E785 Hyperlipidemia, unspecified: Secondary | ICD-10-CM | POA: Insufficient documentation

## 2011-05-14 DIAGNOSIS — R0602 Shortness of breath: Secondary | ICD-10-CM | POA: Insufficient documentation

## 2011-05-14 DIAGNOSIS — E119 Type 2 diabetes mellitus without complications: Secondary | ICD-10-CM | POA: Insufficient documentation

## 2011-05-14 DIAGNOSIS — R0789 Other chest pain: Secondary | ICD-10-CM | POA: Insufficient documentation

## 2011-05-14 DIAGNOSIS — I517 Cardiomegaly: Secondary | ICD-10-CM | POA: Insufficient documentation

## 2011-05-14 DIAGNOSIS — K219 Gastro-esophageal reflux disease without esophagitis: Secondary | ICD-10-CM | POA: Insufficient documentation

## 2011-05-14 DIAGNOSIS — I10 Essential (primary) hypertension: Secondary | ICD-10-CM | POA: Insufficient documentation

## 2011-05-14 DIAGNOSIS — I452 Bifascicular block: Secondary | ICD-10-CM | POA: Insufficient documentation

## 2011-05-14 LAB — DIFFERENTIAL
Basophils Absolute: 0 10*3/uL (ref 0.0–0.1)
Basophils Relative: 0 % (ref 0–1)
Eosinophils Relative: 1 % (ref 0–5)
Lymphocytes Relative: 18 % (ref 12–46)
Monocytes Absolute: 0.5 10*3/uL (ref 0.1–1.0)
Neutro Abs: 6.8 10*3/uL (ref 1.7–7.7)

## 2011-05-14 LAB — POCT I-STAT TROPONIN I

## 2011-05-14 LAB — COMPREHENSIVE METABOLIC PANEL
Albumin: 3.7 g/dL (ref 3.5–5.2)
Alkaline Phosphatase: 44 U/L (ref 39–117)
BUN: 13 mg/dL (ref 6–23)
Chloride: 107 mEq/L (ref 96–112)
Glucose, Bld: 103 mg/dL — ABNORMAL HIGH (ref 70–99)
Potassium: 3.3 mEq/L — ABNORMAL LOW (ref 3.5–5.1)
Total Bilirubin: 0.6 mg/dL (ref 0.3–1.2)

## 2011-05-14 LAB — CBC
Hemoglobin: 11.4 g/dL — ABNORMAL LOW (ref 12.0–15.0)
MCHC: 32.6 g/dL (ref 30.0–36.0)
RDW: 14.5 % (ref 11.5–15.5)
WBC: 8.9 10*3/uL (ref 4.0–10.5)

## 2011-05-14 MED ORDER — IOHEXOL 300 MG/ML  SOLN
100.0000 mL | Freq: Once | INTRAMUSCULAR | Status: AC | PRN
  Administered 2011-05-14: 100 mL via INTRAVENOUS

## 2011-05-14 NOTE — Telephone Encounter (Signed)
Please advise or call

## 2011-05-14 NOTE — Telephone Encounter (Signed)
Spoke with pt's son, who states she is having anxiety and panic attacks.  She cannot "catch her breathe."  Per son, this is not new and she thinks she is dying.  Per son, she is having no pain or problems from her EGD.  She was able to eat yesterday

## 2011-05-14 NOTE — Telephone Encounter (Signed)
Son called back and will take mom to the ER as instructed per Selena Batten. Patient refused to come in to see  Dr Kirtland Bouchard today. She does not want to wait.

## 2011-05-14 NOTE — Telephone Encounter (Signed)
Pts son called and said that pt had endoscopy yesterday, but it did not go well. Pt had panic attack and diff catching breathing. Pt says that she is freezing.Pt did not recognize her son and is calling her son by her brothers name. Pts son is very distraught. Pt is req help or advise from Dr Amador Cunas of what he can do for his mom. The nurse mentioned that this could be dementia or alzheimers.

## 2011-05-14 NOTE — Telephone Encounter (Signed)
Pts son called and said that he took pt to ER and they said that her heart was fine and in good health otherwise and that there was nothing more that ER could do. Pt needs referral to psychiatric doctor asap this wk. Pts son is very upset and is req that Dr Kirtland Bouchard call him today.

## 2011-05-16 ENCOUNTER — Encounter: Payer: Self-pay | Admitting: Internal Medicine

## 2011-05-16 ENCOUNTER — Ambulatory Visit: Payer: Medicare Other | Admitting: Internal Medicine

## 2011-05-16 ENCOUNTER — Ambulatory Visit (INDEPENDENT_AMBULATORY_CARE_PROVIDER_SITE_OTHER): Payer: Medicare Other | Admitting: Internal Medicine

## 2011-05-16 VITALS — BP 120/70 | Temp 98.3°F | Wt 148.0 lb

## 2011-05-16 DIAGNOSIS — Z0000 Encounter for general adult medical examination without abnormal findings: Secondary | ICD-10-CM

## 2011-05-16 DIAGNOSIS — M79609 Pain in unspecified limb: Secondary | ICD-10-CM

## 2011-05-16 DIAGNOSIS — R52 Pain, unspecified: Secondary | ICD-10-CM

## 2011-05-16 DIAGNOSIS — M79606 Pain in leg, unspecified: Secondary | ICD-10-CM

## 2011-05-16 DIAGNOSIS — I10 Essential (primary) hypertension: Secondary | ICD-10-CM

## 2011-05-16 DIAGNOSIS — Z23 Encounter for immunization: Secondary | ICD-10-CM

## 2011-05-16 MED ORDER — ALPRAZOLAM 0.5 MG PO TABS: 0.5000 mg | ORAL_TABLET | Freq: Four times a day (QID) | ORAL | Status: DC | PRN

## 2011-05-16 NOTE — Patient Instructions (Signed)
Followup appointment with Dr. Donell Beers as scheduled  Limit your sodium (Salt) intake    It is important that you exercise regularly, at least 20 minutes 3 to 4 times per week.  If you develop chest pain or shortness of breath seek  medical attention.

## 2011-05-16 NOTE — Progress Notes (Signed)
  Subjective:    Patient ID: [name], female    DOB: [birth year], 77 y.o.   MRN: [id number]  HPI  77 year old patient who is seen today for followup of her panic disorder. Since her last visit here she has had another ED visit. She has been successfully scheduled for a psychiatric evaluation on October 13. Previously had been on lorazepam and presently is on alprazolam which maybe more effective. She also remains on sertraline 100 mg daily. Complaints include weakness belching chest pain and leg pain.    Review of Systems  Constitutional: Positive for fatigue.  HENT: Negative for hearing loss, congestion, sore throat, rhinorrhea, dental problem, sinus pressure and tinnitus.   Eyes: Negative for pain, discharge and visual disturbance.  Respiratory: Negative for cough and shortness of breath.   Cardiovascular: Negative for chest pain, palpitations and leg swelling.  Gastrointestinal: Negative for nausea, vomiting, abdominal pain, diarrhea, constipation, blood in stool and abdominal distention.  Genitourinary: Negative for dysuria, urgency, frequency, hematuria, flank pain, vaginal bleeding, vaginal discharge, difficulty urinating, vaginal pain and pelvic pain.  Musculoskeletal: Positive for gait problem. Negative for joint swelling and arthralgias.  Skin: Negative for rash.  Neurological: Negative for dizziness, syncope, speech difficulty, weakness, numbness and headaches.  Hematological: Negative for adenopathy.  Psychiatric/Behavioral: Positive for behavioral problems. Negative for dysphoric mood and agitation. The patient is nervous/anxious.        Objective:   Physical Exam  Constitutional: She is oriented to person, place, and time. She appears well-developed and well-nourished.       Blood pressure 120/70  HENT:  Head: Normocephalic.  Right Ear: External ear normal.  Left Ear: External ear normal.  Mouth/Throat: Oropharynx is clear and moist.  Eyes: Conjunctivae and EOM  are normal. Pupils are equal, round, and reactive to light.  Neck: Normal range of motion. Neck supple. No thyromegaly present.  Cardiovascular: Normal rate, regular rhythm, normal heart sounds and intact distal pulses.   Pulmonary/Chest: Effort normal and breath sounds normal.  Abdominal: Soft. Bowel sounds are normal. She exhibits no mass. There is no tenderness.  Musculoskeletal: Normal range of motion.  Lymphadenopathy:    She has no cervical adenopathy.  Neurological: She is alert and oriented to person, place, and time.  Skin: Skin is warm and dry. No rash noted.  Psychiatric: She has a normal mood and affect. Her behavior is normal.       Appears to be fairly alert slightly weak and anxious but in no acute distress          Assessment & Plan:   Pain disorder. We'll continue her present regimen until psychiatric assessment Hypertension well controlled Type 2 diabetes. Abdominal pain status post endoscopy will continue Carafate.

## 2011-05-17 ENCOUNTER — Telehealth: Payer: Self-pay | Admitting: *Deleted

## 2011-05-17 ENCOUNTER — Emergency Department (HOSPITAL_COMMUNITY)
Admission: EM | Admit: 2011-05-17 | Discharge: 2011-05-17 | Disposition: A | Discharge status: Home / Self Care | Payer: Medicare Other | Attending: Emergency Medicine | Admitting: Emergency Medicine

## 2011-05-17 DIAGNOSIS — E119 Type 2 diabetes mellitus without complications: Secondary | ICD-10-CM | POA: Insufficient documentation

## 2011-05-17 DIAGNOSIS — R12 Heartburn: Secondary | ICD-10-CM | POA: Insufficient documentation

## 2011-05-17 DIAGNOSIS — Z79899 Other long term (current) drug therapy: Secondary | ICD-10-CM | POA: Insufficient documentation

## 2011-05-17 DIAGNOSIS — R143 Flatulence: Secondary | ICD-10-CM | POA: Insufficient documentation

## 2011-05-17 DIAGNOSIS — R110 Nausea: Secondary | ICD-10-CM | POA: Insufficient documentation

## 2011-05-17 DIAGNOSIS — I10 Essential (primary) hypertension: Secondary | ICD-10-CM | POA: Insufficient documentation

## 2011-05-17 DIAGNOSIS — E785 Hyperlipidemia, unspecified: Secondary | ICD-10-CM | POA: Insufficient documentation

## 2011-05-17 DIAGNOSIS — R142 Eructation: Secondary | ICD-10-CM | POA: Insufficient documentation

## 2011-05-17 DIAGNOSIS — K219 Gastro-esophageal reflux disease without esophagitis: Secondary | ICD-10-CM | POA: Insufficient documentation

## 2011-05-17 DIAGNOSIS — R141 Gas pain: Secondary | ICD-10-CM | POA: Insufficient documentation

## 2011-05-17 DIAGNOSIS — R6883 Chills (without fever): Secondary | ICD-10-CM | POA: Insufficient documentation

## 2011-05-17 LAB — URINALYSIS, ROUTINE W REFLEX MICROSCOPIC
Bilirubin Urine: NEGATIVE
Hgb urine dipstick: NEGATIVE
Ketones, ur: NEGATIVE mg/dL
Protein, ur: NEGATIVE mg/dL
Urobilinogen, UA: 0.2 mg/dL (ref 0.0–1.0)

## 2011-05-17 LAB — POCT I-STAT, CHEM 8
Calcium, Ion: 1.18 mmol/L (ref 1.12–1.32)
Creatinine, Ser: 0.8 mg/dL (ref 0.50–1.10)
Hemoglobin: 11.6 g/dL — ABNORMAL LOW (ref 12.0–15.0)
Sodium: 143 mEq/L (ref 135–145)
TCO2: 24 mmol/L (ref 0–100)

## 2011-05-17 NOTE — Telephone Encounter (Signed)
What number to call son??

## 2011-05-17 NOTE — Telephone Encounter (Signed)
[phone]  

## 2011-05-17 NOTE — Telephone Encounter (Signed)
I will be happy to fill out FL2; however if the son takes the patient to the hospital for admission this will not be necessary

## 2011-05-17 NOTE — Telephone Encounter (Signed)
[name]) is calling and wants Dr. Kirtland Bouchard to fill out a FL2 on pt ASAP.  He is taking her to the ER to start the process of having her committed to a mental hospital??  Please let him know if this is possible, and if Dr. Kirtland Bouchard agrees?

## 2011-05-29 ENCOUNTER — Ambulatory Visit: Payer: Medicare Other | Admitting: Internal Medicine

## 2011-05-30 ENCOUNTER — Ambulatory Visit (INDEPENDENT_AMBULATORY_CARE_PROVIDER_SITE_OTHER): Payer: Medicare Other | Admitting: Internal Medicine

## 2011-05-30 ENCOUNTER — Inpatient Hospital Stay (HOSPITAL_COMMUNITY): Admission: RE | Admit: 2011-05-30 | Payer: Medicare Other | Source: Ambulatory Visit

## 2011-05-30 ENCOUNTER — Encounter: Payer: Self-pay | Admitting: Internal Medicine

## 2011-05-30 DIAGNOSIS — E119 Type 2 diabetes mellitus without complications: Secondary | ICD-10-CM

## 2011-05-30 DIAGNOSIS — R52 Pain, unspecified: Secondary | ICD-10-CM

## 2011-05-30 DIAGNOSIS — I10 Essential (primary) hypertension: Secondary | ICD-10-CM

## 2011-05-30 NOTE — Progress Notes (Signed)
  Subjective:    Patient ID: [name], female    DOB: 04-30-1923, 77 y.o.   MRN: [id number]  HPI   77 year old patient who is seen today for followup.  This unfortunate patient has significant anxiety and a panic disorder. She is scheduled for psychiatric evaluation but the appointment is 2 weeks in the future. She has had another ED visit since her last visit here. Today she complains of anxiety but mainly a cold sensation. She states that she has a difficult time getting warm and is constantly freezing. She requires a considerable assistance with her son and also is assisted with advanced home care. She has type 2 diabetes which has been stable. She has hypertension and dyslipidemia. Denies any cardiopulmonary complaints. Blood pressure has been well-controlled    Review of Systems  HENT: Negative for hearing loss, congestion, sore throat, rhinorrhea, dental problem, sinus pressure and tinnitus.   Eyes: Negative for pain, discharge and visual disturbance.  Respiratory: Negative for cough and shortness of breath.   Cardiovascular: Negative for chest pain, palpitations and leg swelling.  Gastrointestinal: Negative for nausea, vomiting, abdominal pain, diarrhea, constipation, blood in stool and abdominal distention.  Genitourinary: Negative for dysuria, urgency, frequency, hematuria, flank pain, vaginal bleeding, vaginal discharge, difficulty urinating, vaginal pain and pelvic pain.  Musculoskeletal: Negative for joint swelling, arthralgias and gait problem.  Skin: Negative for rash.  Neurological: Positive for weakness and light-headedness. Negative for dizziness, syncope, speech difficulty, numbness and headaches.  Hematological: Negative for adenopathy.  Psychiatric/Behavioral: Negative for behavioral problems, dysphoric mood and agitation. The patient is not nervous/anxious.        Objective:   Physical Exam  Constitutional: She is oriented to person, place, and time. She appears  well-developed and well-nourished.  HENT:  Head: Normocephalic.  Right Ear: External ear normal.  Left Ear: External ear normal.  Mouth/Throat: Oropharynx is clear and moist.  Eyes: Conjunctivae and EOM are normal. Pupils are equal, round, and reactive to light.  Neck: Normal range of motion. Neck supple. No thyromegaly present.  Cardiovascular: Normal rate, regular rhythm, normal heart sounds and intact distal pulses.   Pulmonary/Chest: Effort normal and breath sounds normal.  Abdominal: Soft. Bowel sounds are normal. She exhibits no mass. There is no tenderness.  Musculoskeletal: Normal range of motion.  Lymphadenopathy:    She has no cervical adenopathy.  Neurological: She is alert and oriented to person, place, and time.  Skin: Skin is warm and dry. No rash noted.  Psychiatric: She has a normal mood and affect. Her behavior is normal.          Assessment & Plan:   Anxiety/panic disorder. Will continue present regimen / psychiatric followup as scheduled Diabetes stable Hypertension well controlled

## 2011-05-30 NOTE — Patient Instructions (Signed)
Psychiatric followup as scheduled  Please check your hemoglobin A1c every 3 months  Limit your sodium (Salt) intake

## 2011-06-07 ENCOUNTER — Telehealth: Payer: Self-pay | Admitting: *Deleted

## 2011-06-07 NOTE — Telephone Encounter (Signed)
Needs extension of [facility] Orders for 2 more weeks.

## 2011-06-07 NOTE — Telephone Encounter (Signed)
Left message on Home Health's voice mail with order.

## 2011-06-07 NOTE — Telephone Encounter (Signed)
ok 

## 2011-06-13 ENCOUNTER — Emergency Department (HOSPITAL_COMMUNITY)
Admission: EM | Admit: 2011-06-13 | Discharge: 2011-06-13 | Disposition: A | Discharge status: Home / Self Care | Payer: Medicare Other | Attending: Emergency Medicine | Admitting: Emergency Medicine

## 2011-06-13 ENCOUNTER — Emergency Department (HOSPITAL_COMMUNITY): Payer: Medicare Other

## 2011-06-13 DIAGNOSIS — E119 Type 2 diabetes mellitus without complications: Secondary | ICD-10-CM | POA: Insufficient documentation

## 2011-06-13 DIAGNOSIS — I10 Essential (primary) hypertension: Secondary | ICD-10-CM | POA: Insufficient documentation

## 2011-06-13 DIAGNOSIS — R109 Unspecified abdominal pain: Secondary | ICD-10-CM | POA: Insufficient documentation

## 2011-06-13 DIAGNOSIS — K5900 Constipation, unspecified: Secondary | ICD-10-CM | POA: Insufficient documentation

## 2011-06-13 DIAGNOSIS — F411 Generalized anxiety disorder: Secondary | ICD-10-CM | POA: Insufficient documentation

## 2011-06-13 LAB — CBC
MCH: 28.3 pg (ref 26.0–34.0)
MCHC: 32.5 g/dL (ref 30.0–36.0)
Platelets: 280 10*3/uL (ref 150–400)
RDW: 14 % (ref 11.5–15.5)

## 2011-06-13 LAB — COMPREHENSIVE METABOLIC PANEL
ALT: 10 U/L (ref 0–35)
AST: 14 U/L (ref 0–37)
Calcium: 9.8 mg/dL (ref 8.4–10.5)
Creatinine, Ser: 0.84 mg/dL (ref 0.50–1.10)
GFR calc Af Amer: 71 mL/min — ABNORMAL LOW (ref >90)
Sodium: 141 mEq/L (ref 135–145)
Total Protein: 7.2 g/dL (ref 6.0–8.3)

## 2011-06-13 LAB — DIFFERENTIAL
Basophils Relative: 0 % (ref 0–1)
Eosinophils Absolute: 0 10*3/uL (ref 0.0–0.7)
Eosinophils Relative: 0 % (ref 0–5)
Monocytes Absolute: 0.6 10*3/uL (ref 0.1–1.0)
Monocytes Relative: 6 % (ref 3–12)
Neutrophils Relative %: 75 % (ref 43–77)

## 2011-06-13 LAB — URINALYSIS, ROUTINE W REFLEX MICROSCOPIC
Bilirubin Urine: NEGATIVE
Hgb urine dipstick: NEGATIVE
Nitrite: NEGATIVE
Specific Gravity, Urine: 1.019 (ref 1.005–1.030)
pH: 8 (ref 5.0–8.0)

## 2011-06-14 ENCOUNTER — Telehealth: Payer: Self-pay | Admitting: *Deleted

## 2011-06-14 NOTE — Telephone Encounter (Signed)
ok 

## 2011-06-14 NOTE — Telephone Encounter (Signed)
Called and gave order.

## 2011-06-14 NOTE — Telephone Encounter (Signed)
PT needs an order for one more session. [phone]

## 2011-06-20 NOTE — Telephone Encounter (Signed)
See other note

## 2011-06-21 ENCOUNTER — Telehealth: Payer: Self-pay | Admitting: *Deleted

## 2011-06-21 NOTE — Telephone Encounter (Signed)
Son is calling stating Pt is having more complaints of abdominal pain and asking to go to the ER.  Advised to call her Psychiatrist, and see what they advise.  If pt becomes obviously ill over the weekend, he can call the Saturday clinic, or take her to the ER.  In the meantime, he will try distract her from dwelling on her health and symptoms.

## 2011-07-01 NOTE — Telephone Encounter (Signed)
Dr. K notified. 

## 2011-08-02 ENCOUNTER — Telehealth: Payer: Self-pay | Admitting: Internal Medicine

## 2011-08-02 ENCOUNTER — Encounter: Payer: Self-pay | Admitting: Internal Medicine

## 2011-08-02 ENCOUNTER — Ambulatory Visit (INDEPENDENT_AMBULATORY_CARE_PROVIDER_SITE_OTHER): Payer: Medicare Other | Admitting: Internal Medicine

## 2011-08-02 DIAGNOSIS — E119 Type 2 diabetes mellitus without complications: Secondary | ICD-10-CM

## 2011-08-02 DIAGNOSIS — I10 Essential (primary) hypertension: Secondary | ICD-10-CM

## 2011-08-02 DIAGNOSIS — F329 Major depressive disorder, single episode, unspecified: Secondary | ICD-10-CM

## 2011-08-02 DIAGNOSIS — R0789 Other chest pain: Secondary | ICD-10-CM

## 2011-08-02 LAB — HM DIABETES FOOT EXAM

## 2011-08-02 LAB — GLUCOSE, POCT (MANUAL RESULT ENTRY): POC Glucose: 106

## 2011-08-02 MED ORDER — DONEPEZIL HCL 5 MG PO TABS: 5.0000 mg | ORAL_TABLET | Freq: Every day | ORAL | Status: DC

## 2011-08-02 NOTE — Patient Instructions (Signed)
Limit your sodium (Salt) intake   Please check your hemoglobin A1c every 3 months   

## 2011-08-02 NOTE — Progress Notes (Signed)
  Subjective:    Patient ID: [name], female    DOB: 1923-04-18, 77 y.o.   MRN: [id number]  HPI  77 year old patient who is accompanied by her son. She is followed by psychiatry for severe anxiety and panic disorder. Since her last visit here sertraline has been discontinued she remains on Ativan with fairly good results. Complaints include pain distal to the knees. She does have type 2 diabetes. She has multiple somatic complaints. Her her son and her psychiatrist raised the issue of memory disturbance and consideration of therapy.   Review of Systems  Constitutional: Negative.   HENT: Negative for hearing loss, congestion, sore throat, rhinorrhea, dental problem, sinus pressure and tinnitus.   Eyes: Negative for pain, discharge and visual disturbance.  Respiratory: Negative for cough and shortness of breath.   Cardiovascular: Negative for chest pain, palpitations and leg swelling.  Gastrointestinal: Negative for nausea, vomiting, abdominal pain, diarrhea, constipation, blood in stool and abdominal distention.  Genitourinary: Negative for dysuria, urgency, frequency, hematuria, flank pain, vaginal bleeding, vaginal discharge, difficulty urinating, vaginal pain and pelvic pain.  Musculoskeletal: Positive for arthralgias. Negative for joint swelling and gait problem.       Bilateral pain distal to the knees to the ankles but sparing the feet  Skin: Negative for rash.  Neurological: Negative for dizziness, syncope, speech difficulty, weakness, numbness and headaches.  Hematological: Negative for adenopathy.  Psychiatric/Behavioral: Positive for confusion and agitation. Negative for behavioral problems and dysphoric mood. The patient is nervous/anxious.        Objective:   Physical Exam  Constitutional: She is oriented to person, place, and time. She appears well-developed and well-nourished.  HENT:  Head: Normocephalic.  Right Ear: External ear normal.  Left Ear: External ear  normal.  Mouth/Throat: Oropharynx is clear and moist.  Eyes: Conjunctivae and EOM are normal. Pupils are equal, round, and reactive to light.  Neck: Normal range of motion. Neck supple. No thyromegaly present.  Cardiovascular: Normal rate, regular rhythm, normal heart sounds and intact distal pulses.   Pulmonary/Chest: Effort normal and breath sounds normal.  Abdominal: Soft. Bowel sounds are normal. She exhibits no mass. There is no tenderness.  Musculoskeletal: Normal range of motion.  Lymphadenopathy:    She has no cervical adenopathy.  Neurological: She is alert and oriented to person, place, and time.  Skin: Skin is warm and dry. No rash noted.  Psychiatric: She has a normal mood and affect. Her behavior is normal.          Assessment & Plan:   Mild cognitive impairment Anxiety disorder stable Diabetes mellitus stable  We'll start on Aricept 5 mg daily. We'll recheck in 3 months. We'll check a hemoglobin A1c at that time

## 2011-08-02 NOTE — Telephone Encounter (Signed)
appt at 230 today - noted

## 2011-08-02 NOTE — Telephone Encounter (Signed)
Saw her psychairatrist and was complaining of her left side chest area. No fever. Has been depressed and is crying a lot. Taking Atitivan to help her sleep at night. Pt will be in this afternoon for an appt @ 2:30. Was told to call back if experiencing more intense  chest pains. Thanks.

## 2011-08-11 ENCOUNTER — Other Ambulatory Visit: Payer: Self-pay | Admitting: Internal Medicine

## 2011-08-14 ENCOUNTER — Telehealth: Payer: Self-pay

## 2011-08-14 NOTE — Telephone Encounter (Signed)
Pt's son called and states his mother's temperature was 98.9 and it is usually 97.4.  Pt states her the bottom of her stomach is hurting and she has a headache. Pt states to son she is "fine."  Pt's son states he mother needs to have a bowel movement.  Pt's son is aware that if his mother begins to experience other symptoms to please call back for an appt.  Pt denies any urinary frequency, or burning.  Pt denies cough and congestion.

## 2011-08-21 ENCOUNTER — Telehealth: Payer: Self-pay

## 2011-08-21 NOTE — Telephone Encounter (Signed)
Pt's son aware of this.

## 2011-08-21 NOTE — Telephone Encounter (Signed)
Continue lorazepam- will discuss neurology next ROV

## 2011-08-21 NOTE — Telephone Encounter (Signed)
Pt had to have minor eye surgery and pt is very upset about this. Pt's son states pt needs something to calm her down.  Pt is taking Ativan but this is not calming pt down.  Are there any other recommendations for pt?  Pt's legs are still bothering her as well. Pt's son would like a referral to neurology. Pls advise.

## 2011-08-21 NOTE — Telephone Encounter (Signed)
Left a message for return call.  

## 2011-09-03 HISTORY — PX: PACEMAKER INSERTION: SHX728

## 2011-09-28 ENCOUNTER — Encounter (HOSPITAL_COMMUNITY): Payer: Self-pay | Admitting: *Deleted

## 2011-09-28 ENCOUNTER — Other Ambulatory Visit: Payer: Self-pay

## 2011-09-28 ENCOUNTER — Inpatient Hospital Stay (HOSPITAL_COMMUNITY)
Admission: EM | Admit: 2011-09-28 | Discharge: 2011-10-01 | DRG: 244 | Disposition: A | Discharge status: Home / Self Care | Payer: Medicare Other | Attending: Internal Medicine | Admitting: Internal Medicine

## 2011-09-28 DIAGNOSIS — F70 Mild intellectual disabilities: Secondary | ICD-10-CM | POA: Diagnosis present

## 2011-09-28 DIAGNOSIS — E1149 Type 2 diabetes mellitus with other diabetic neurological complication: Secondary | ICD-10-CM | POA: Insufficient documentation

## 2011-09-28 DIAGNOSIS — R001 Bradycardia, unspecified: Secondary | ICD-10-CM

## 2011-09-28 DIAGNOSIS — F341 Dysthymic disorder: Secondary | ICD-10-CM | POA: Diagnosis present

## 2011-09-28 DIAGNOSIS — I452 Bifascicular block: Secondary | ICD-10-CM | POA: Diagnosis present

## 2011-09-28 DIAGNOSIS — R531 Weakness: Secondary | ICD-10-CM

## 2011-09-28 DIAGNOSIS — Z862 Personal history of diseases of the blood and blood-forming organs and certain disorders involving the immune mechanism: Secondary | ICD-10-CM | POA: Insufficient documentation

## 2011-09-28 DIAGNOSIS — R5381 Other malaise: Secondary | ICD-10-CM | POA: Diagnosis present

## 2011-09-28 DIAGNOSIS — Z950 Presence of cardiac pacemaker: Secondary | ICD-10-CM | POA: Diagnosis not present

## 2011-09-28 DIAGNOSIS — F0280 Dementia in other diseases classified elsewhere without behavioral disturbance: Secondary | ICD-10-CM | POA: Diagnosis present

## 2011-09-28 DIAGNOSIS — Z7982 Long term (current) use of aspirin: Secondary | ICD-10-CM

## 2011-09-28 DIAGNOSIS — I10 Essential (primary) hypertension: Secondary | ICD-10-CM | POA: Diagnosis present

## 2011-09-28 DIAGNOSIS — E119 Type 2 diabetes mellitus without complications: Secondary | ICD-10-CM

## 2011-09-28 DIAGNOSIS — I4891 Unspecified atrial fibrillation: Principal | ICD-10-CM | POA: Diagnosis present

## 2011-09-28 DIAGNOSIS — R42 Dizziness and giddiness: Secondary | ICD-10-CM | POA: Diagnosis present

## 2011-09-28 DIAGNOSIS — I453 Trifascicular block: Secondary | ICD-10-CM | POA: Diagnosis present

## 2011-09-28 DIAGNOSIS — D509 Iron deficiency anemia, unspecified: Secondary | ICD-10-CM | POA: Diagnosis present

## 2011-09-28 DIAGNOSIS — E876 Hypokalemia: Secondary | ICD-10-CM | POA: Diagnosis present

## 2011-09-28 HISTORY — DX: Anxiety disorder, unspecified: F41.9

## 2011-09-28 LAB — COMPREHENSIVE METABOLIC PANEL
BUN: 16 mg/dL (ref 6–23)
CO2: 21 mEq/L (ref 19–32)
Chloride: 110 mEq/L (ref 96–112)
Creatinine, Ser: 0.65 mg/dL (ref 0.50–1.10)
GFR calc Af Amer: >90 mL/min (ref >90)
GFR calc non Af Amer: 78 mL/min — ABNORMAL LOW (ref >90)
Glucose, Bld: 105 mg/dL — ABNORMAL HIGH (ref 70–99)
Total Bilirubin: 0.2 mg/dL — ABNORMAL LOW (ref 0.3–1.2)

## 2011-09-28 LAB — DIFFERENTIAL
Eosinophils Relative: 0 % (ref 0–5)
Lymphocytes Relative: 15 % (ref 12–46)
Monocytes Absolute: 0.5 10*3/uL (ref 0.1–1.0)
Monocytes Relative: 5 % (ref 3–12)
Neutro Abs: 7 10*3/uL (ref 1.7–7.7)

## 2011-09-28 LAB — CBC
HCT: 28.2 % — ABNORMAL LOW (ref 36.0–46.0)
Hemoglobin: 9.2 g/dL — ABNORMAL LOW (ref 12.0–15.0)
MCV: 86 fL (ref 78.0–100.0)
WBC: 8.9 10*3/uL (ref 4.0–10.5)

## 2011-09-28 LAB — TROPONIN I: Troponin I: <0.3 ng/mL (ref <0.30)

## 2011-09-28 LAB — URINALYSIS, ROUTINE W REFLEX MICROSCOPIC
Hgb urine dipstick: NEGATIVE
Nitrite: NEGATIVE
Protein, ur: NEGATIVE mg/dL
Urobilinogen, UA: 0.2 mg/dL (ref 0.0–1.0)

## 2011-09-28 MED ORDER — PANTOPRAZOLE SODIUM 40 MG PO TBEC: 40.0000 mg | DELAYED_RELEASE_TABLET | Freq: Once | ORAL | Status: DC

## 2011-09-28 MED ORDER — ONDANSETRON HCL 4 MG/2ML IJ SOLN
INTRAMUSCULAR | Status: AC
  Administered 2011-09-28: 21:00:00
  Filled 2011-09-28: qty 2

## 2011-09-28 MED ORDER — ONDANSETRON HCL 4 MG/2ML IJ SOLN
4.0000 mg | Freq: Once | INTRAMUSCULAR | Status: AC
  Administered 2011-09-28: 4 mg via INTRAVENOUS
  Filled 2011-09-28: qty 2

## 2011-09-28 NOTE — ED Provider Notes (Signed)
History     CSN: [id number]  Arrival date & time 09/28/11  2052   First MD Initiated Contact with Patient 09/28/11 2129      Chief Complaint  Patient presents with  . Weakness  . Dizziness    (Consider location/radiation/quality/duration/timing/severity/associated sxs/prior treatment) Patient is a 78 y.o. female presenting with weakness.  Weakness The primary symptoms include dizziness, nausea and vomiting. Primary symptoms do not include syncope or fever. Episode onset: 2 days, worse for past 2 hours. The symptoms are unchanged. The neurological symptoms are diffuse. Context: when walking into her home.   Dizziness also occurs with nausea, vomiting and weakness.  Additional symptoms include weakness. Additional symptoms do not include pain. Medical issues also include diabetes.    Past Medical History  Diagnosis Date  . ANEMIA, DEFICIENCY, HX OF 03/01/2010  . DEPRESSIVE DISORDER 09/19/2010  . DM w/o Complication Type II 05/18/2007  . GERD 05/18/2007  . HYPERLIPIDEMIA 03/17/2008  . HYPERTENSION 05/18/2007  . INSOMNIA 11/23/2008  . Loss of weight 05/30/2010  . Kidney stone   . Glaucoma   . Anxiety     Past Surgical History  Procedure Date  . Abdominal hysterectomy   . Cataract extraction   . Hemorrhoid surgery   . Appendectomy     Family History  Problem Relation Age of Onset  . Brain cancer Father   . Colon cancer Sister   . Colitis Brother     History  Substance Use Topics  . Smoking status: Never Smoker   . Smokeless tobacco: Never Used  . Alcohol Use: No    OB History    Grav Para Term Preterm Abortions TAB SAB Ect Mult Living                  Review of Systems  Constitutional: Negative for fever.  HENT: Negative for congestion, sore throat and rhinorrhea.   Respiratory: Negative for cough and shortness of breath.   Cardiovascular: Negative for chest pain and syncope.  Gastrointestinal: Positive for nausea and vomiting. Negative for abdominal pain and  diarrhea.  Genitourinary: Negative for difficulty urinating.  Neurological: Positive for dizziness and weakness.  All other systems reviewed and are negative.    Allergies  Amoxicillin and Penicillins  Home Medications   Current Outpatient Rx  Name Route Sig Dispense Refill  . ALPRAZOLAM 0.5 MG PO TABS Oral Take 0.5 mg by mouth every 6 (six) hours as needed. For anxiety.    Marland Kitchen ALUMINUM & MAGNESIUM HYDROXIDE 225-200 MG/5ML PO SUSP Oral Take 5 mLs by mouth every 6 (six) hours as needed. For heartburn.    . ASPIRIN 81 MG PO TABS Oral Take 81 mg by mouth daily.      . FUROSEMIDE 20 MG PO TABS Oral Take 20 mg by mouth 2 (two) times daily.     Marland Kitchen LORAZEPAM 0.5 MG PO TABS Oral Take 0.5-1 mg by mouth 2 (two) times daily. Take 1 tablet in the morning, and 2 tablets in the evening.    Marland Kitchen METFORMIN HCL ER 500 MG PO TB24 Oral Take 500 mg by mouth at bedtime.     Marland Kitchen SIMVASTATIN 80 MG PO TABS Oral Take 40 mg by mouth daily.       BP 145/57  Pulse 64  Temp(Src) 98.2 F (36.8 C) (Oral)  Resp 15  SpO2 99%  Physical Exam  Nursing note and vitals reviewed. Constitutional: She is oriented to person, place, and time. She appears well-developed and well-nourished. No  distress.       Elderly.  NAD, but appears weak.  HENT:  Head: Normocephalic and atraumatic.  Mouth/Throat: Oropharynx is clear and moist.  Eyes: Conjunctivae are normal. Pupils are equal, round, and reactive to light. No scleral icterus.  Neck: Neck supple.  Cardiovascular: Normal rate, regular rhythm, normal heart sounds and intact distal pulses.   No murmur heard. Pulmonary/Chest: Effort normal and breath sounds normal. No stridor. No respiratory distress. She has no wheezes. She has no rales.  Abdominal: Soft. Bowel sounds are normal. She exhibits no distension. There is no tenderness.  Musculoskeletal: Normal range of motion.  Neurological: She is alert and oriented to person, place, and time.  Skin: Skin is warm and dry. No rash  noted.  Psychiatric: She has a normal mood and affect. Her behavior is normal.    ED Course  Procedures (including critical care time)  Labs Reviewed  CBC - Abnormal; Notable for the following:    RBC 3.28 (*)    Hemoglobin 9.2 (*)    HCT 28.2 (*)    All other components within normal limits  DIFFERENTIAL - Abnormal; Notable for the following:    Neutrophils Relative 79 (*)    All other components within normal limits  COMPREHENSIVE METABOLIC PANEL - Abnormal; Notable for the following:    Potassium 3.3 (*)    Glucose, Bld 105 (*)    Total Protein 5.8 (*)    Albumin 2.9 (*)    Alkaline Phosphatase 32 (*)    Total Bilirubin 0.2 (*)    GFR calc non Af Amer 78 (*)    All other components within normal limits  URINALYSIS, ROUTINE W REFLEX MICROSCOPIC - Abnormal; Notable for the following:    APPearance CLOUDY (*)    pH 8.5 (*)    All other components within normal limits  TROPONIN I  OCCULT BLOOD, POC DEVICE  OCCULT BLOOD X 1 CARD TO LAB, STOOL  BASIC METABOLIC PANEL  CBC  CARDIAC PANEL(CRET KIN+CKTOT+MB+TROPI)  CARDIAC PANEL(CRET KIN+CKTOT+MB+TROPI)  CARDIAC PANEL(CRET KIN+CKTOT+MB+TROPI)  LIPID PANEL   No results found.   Date: 09/29/2011 21:04  Rate: 59  Rhythm: sinus bradycardia  QRS Axis: left  Intervals: normal  ST/T Wave abnormalities: nonspecific T wave changes  Conduction Disutrbances:right bundle branch block and left anterior fascicular block   Date: 09/29/2011 23:04  Rate: 48  Rhythm: sinus bradycardia  QRS Axis: left  Intervals: normal  ST/T Wave abnormalities: nonspecific T wave changes  Conduction Disutrbances:right bundle branch block and left anterior fascicular block    1. Bradycardia   2. Weakness generalized       MDM  78 yo female with onset of weakness two days ago, with sudden worsening tonight when she and her son were walking in from dinner.  Associated with presyncoep and nausea.  No syncope, no fevers, no chest pain, no  shortness of breath.  ASA prior to arrival at home, which did not improve symptoms.   On EMS transport, had a rhythm strip concerning for A fib with rate in 80's.  On arrival, she was bradycardic with sinus rhythm.  Huston Foley to as low as 30's.  Remained normotensive.  During ED course, she also complained of abdominal pain, of which she has a chronic history. Given protonix.  Also complained of urinary urgency.  UA negative.  Labwork was obtained and showed a drop of 2.5 g of Hg in approx 3 months.  Stool was black but heme negative.  Remainder of  labwork unremarkable including a negative troponin.  Discussed with [facility] Dr. Lovell Sheehan who will admit.        Warnell Forester, MD 09/29/11 [id number]

## 2011-09-28 NOTE — ED Notes (Signed)
Pt currently denies pain in chest. Pt denies N/V, SOB. Pt states that she feels weak pt sounds tired when she talks and is only able to answer a few questions at a time before she starts to feel tired and closes her eyes. Pt is able to move extremities.

## 2011-09-28 NOTE — ED Notes (Signed)
EDP and resident aware pt initial rhythm was a fib with rate of 80-90's and now pt rhythm is NSR with PVC's and that pt rate is between 40-60. Pt placed on pacer pads per RN.

## 2011-09-28 NOTE — ED Notes (Signed)
Pt hr dow in the low 40's.  Pt remains ao x 4.  Continues to deny chest pain or sob.  Pt has started c/o pain across lower abd for the past 30 min "pain like I want to go, but can't go".  Pt last bm this am.  BS hyperactive in all 4 quadrants.

## 2011-09-28 NOTE — ED Notes (Signed)
Per EMS: per son pt has been feeling weak and dizzy for the past three days. Pt tonight told son after dinner that she felt like she was going to pass out, pt did not pass out. EMS arrival pt hooked up to montior pt in a new onset of A-fib with a controlled rate between 80-90. Pt denied pain. Pt in the truck was nauseated and did vomit, after vomiting pt felt a slight CP that was under her left breast. Pt Zofran in route.

## 2011-09-29 ENCOUNTER — Other Ambulatory Visit: Payer: Self-pay

## 2011-09-29 DIAGNOSIS — I480 Paroxysmal atrial fibrillation: Secondary | ICD-10-CM

## 2011-09-29 DIAGNOSIS — I4891 Unspecified atrial fibrillation: Principal | ICD-10-CM

## 2011-09-29 DIAGNOSIS — I498 Other specified cardiac arrhythmias: Secondary | ICD-10-CM

## 2011-09-29 LAB — LIPID PANEL
Cholesterol: 164 mg/dL (ref 0–200)
HDL: 59 mg/dL (ref >39)
Triglycerides: 136 mg/dL (ref <150)
VLDL: 27 mg/dL (ref 0–40)

## 2011-09-29 LAB — FOLATE: Folate: >20 ng/mL

## 2011-09-29 LAB — BASIC METABOLIC PANEL
BUN: 17 mg/dL (ref 6–23)
Chloride: 107 mEq/L (ref 96–112)
Creatinine, Ser: 0.82 mg/dL (ref 0.50–1.10)
GFR calc Af Amer: 73 mL/min — ABNORMAL LOW (ref >90)

## 2011-09-29 LAB — CARDIAC PANEL(CRET KIN+CKTOT+MB+TROPI)
CK, MB: 1.5 ng/mL (ref 0.3–4.0)
CK, MB: 1.6 ng/mL (ref 0.3–4.0)
CK, MB: 1.7 ng/mL (ref 0.3–4.0)
Troponin I: <0.3 ng/mL (ref <0.30)

## 2011-09-29 LAB — GLUCOSE, CAPILLARY
Glucose-Capillary: 113 mg/dL — ABNORMAL HIGH (ref 70–99)
Glucose-Capillary: 143 mg/dL — ABNORMAL HIGH (ref 70–99)
Glucose-Capillary: 117 mg/dL — ABNORMAL HIGH (ref 70–99)

## 2011-09-29 LAB — IRON AND TIBC
Saturation Ratios: 17 % — ABNORMAL LOW (ref 20–55)
TIBC: 364 ug/dL (ref 250–470)

## 2011-09-29 LAB — RETICULOCYTES
RBC.: 3.51 MIL/uL — ABNORMAL LOW (ref 3.87–5.11)
Retic Ct Pct: 1.1 % (ref 0.4–3.1)

## 2011-09-29 LAB — CBC
HCT: 30.2 % — ABNORMAL LOW (ref 36.0–46.0)
MCHC: 32.5 g/dL (ref 30.0–36.0)
MCV: 86 fL (ref 78.0–100.0)
RDW: 14.3 % (ref 11.5–15.5)

## 2011-09-29 MED ORDER — ENOXAPARIN SODIUM 40 MG/0.4ML ~~LOC~~ SOLN
40.0000 mg | Freq: Every day | SUBCUTANEOUS | Status: DC
  Filled 2011-09-29: qty 0.4

## 2011-09-29 MED ORDER — ACETAMINOPHEN 650 MG RE SUPP: 650.0000 mg | Freq: Four times a day (QID) | RECTAL | Status: DC | PRN

## 2011-09-29 MED ORDER — INSULIN ASPART 100 UNIT/ML ~~LOC~~ SOLN
0.0000 [IU] | Freq: Three times a day (TID) | SUBCUTANEOUS | Status: DC
  Administered 2011-09-29: 1 [IU] via SUBCUTANEOUS
  Filled 2011-09-29: qty 3

## 2011-09-29 MED ORDER — PANTOPRAZOLE SODIUM 40 MG IV SOLR
40.0000 mg | Freq: Once | INTRAVENOUS | Status: AC
  Administered 2011-09-29: 40 mg via INTRAVENOUS
  Filled 2011-09-29: qty 40

## 2011-09-29 MED ORDER — OXYCODONE HCL 5 MG PO TABS: 5.0000 mg | ORAL_TABLET | ORAL | Status: DC | PRN

## 2011-09-29 MED ORDER — ZOLPIDEM TARTRATE 5 MG PO TABS
5.0000 mg | ORAL_TABLET | Freq: Every evening | ORAL | Status: DC | PRN
  Administered 2011-09-29: 5 mg via ORAL
  Filled 2011-09-29: qty 1

## 2011-09-29 MED ORDER — INSULIN ASPART 100 UNIT/ML ~~LOC~~ SOLN: 0.0000 [IU] | Freq: Every day | SUBCUTANEOUS | Status: DC

## 2011-09-29 MED ORDER — ONDANSETRON HCL 4 MG/2ML IJ SOLN
4.0000 mg | Freq: Four times a day (QID) | INTRAMUSCULAR | Status: DC | PRN
  Administered 2011-09-29: 4 mg via INTRAVENOUS
  Filled 2011-09-29: qty 2

## 2011-09-29 MED ORDER — ALUM & MAG HYDROXIDE-SIMETH 200-200-20 MG/5ML PO SUSP
30.0000 mL | Freq: Four times a day (QID) | ORAL | Status: DC | PRN
  Administered 2011-09-29: 30 mL via ORAL
  Filled 2011-09-29: qty 30

## 2011-09-29 MED ORDER — FERROUS SULFATE 325 (65 FE) MG PO TABS
325.0000 mg | ORAL_TABLET | Freq: Two times a day (BID) | ORAL | Status: DC
  Administered 2011-09-29 – 2011-09-30 (×2): 325 mg via ORAL
  Filled 2011-09-29 (×4): qty 1

## 2011-09-29 MED ORDER — ONDANSETRON HCL 4 MG PO TABS: 4.0000 mg | ORAL_TABLET | Freq: Four times a day (QID) | ORAL | Status: DC | PRN

## 2011-09-29 MED ORDER — NITROGLYCERIN 2 % TD OINT
0.5000 [in_us] | TOPICAL_OINTMENT | Freq: Four times a day (QID) | TRANSDERMAL | Status: DC
  Administered 2011-09-29 (×3): 0.5 [in_us] via TOPICAL
  Filled 2011-09-29 (×2): qty 1
  Filled 2011-09-29: qty 30

## 2011-09-29 MED ORDER — HYDROMORPHONE HCL PF 1 MG/ML IJ SOLN: 0.5000 mg | INTRAMUSCULAR | Status: DC | PRN

## 2011-09-29 MED ORDER — NITROGLYCERIN 2 % TD OINT: 0.5000 [in_us] | TOPICAL_OINTMENT | Freq: Four times a day (QID) | TRANSDERMAL | Status: DC

## 2011-09-29 MED ORDER — POTASSIUM CHLORIDE CRYS ER 20 MEQ PO TBCR
40.0000 meq | EXTENDED_RELEASE_TABLET | Freq: Once | ORAL | Status: AC
  Administered 2011-09-29: 40 meq via ORAL
  Filled 2011-09-29: qty 2

## 2011-09-29 MED ORDER — SODIUM CHLORIDE 0.9 % IV SOLN
INTRAVENOUS | Status: DC
  Administered 2011-09-29 (×2): via INTRAVENOUS

## 2011-09-29 MED ORDER — ACETAMINOPHEN 325 MG PO TABS: 650.0000 mg | ORAL_TABLET | Freq: Four times a day (QID) | ORAL | Status: DC | PRN

## 2011-09-29 NOTE — ED Provider Notes (Addendum)
I saw and evaluated the patient, reviewed the resident's note and I agree with the findings and plan.   .Face to face Exam:  General:  Awake HEENT:  Atraumatic Resp:  Normal effort Abd:  Nondistended Neuro:No focal weakness Lymph: No adenopathy         Nelia Shi, MD 11/06/11 2308

## 2011-09-29 NOTE — Progress Notes (Addendum)
Subjective: I had a very long discussion with the patient and her son. It appears a history patient has been having paroxysm of possible atrial fibrillation causing to her to have compromise of her breathing. I suspect the patient has been going in and out of atrial fibrillation to sinus rhythm. The patient states that she has been told that she's had atrial fibrillation for about 10 years now. The issue of Coumadin has been raised with her in the past and she does not want to pursue Coumadin for treatment of her atrial fibrillation. She states that she was supposed to be taking an aspirin however has not been taking it consistently as her son said he was focusing on other medications instead of her aspirin. The patient also states that she has known anemia and had been put on iron in the past. Her son took over the administration of her medications starting about September and by his account he has not been giving her iron although the patient was under the impression that she was receiving iron which she had been taking prior to her son taking over her medication administration. I'm sure the question remains as to whether or not this patient has the capacity to make a decision about her Coumadin given her diagnosis of dementia however at this patient is able to have a very coherent conversation with reliable timelines as confirmed by her son. Additionally the patient is able to recall the events of last night with accuracy again as confirmed by her son. Thus I do believe that the patient has the capacity to make decisions regarding use of Coumadin as it relates to her age her fibrillation. Today the patient states that she has no further feeling of difficulty breathing. She specifically states that she did not feel any dizziness she did not have any abdominal pain and that her complaint was that she had such a difficult time catching her breath that she felt that she was just going to pass out. However her idea  of passing out is not dizziness or near syncope but rather a feeling of just feeling badly as it relates to her breathing. She states that she has had many episodes of this in the past but mostly occurring while sleeping which has caused her to need to sit up in the bed and as she states when she "catches her breath" she then feels fine and goes back to sleep. Objective: Filed Vitals:   09/29/11 0137 09/29/11 0253 09/29/11 0325 09/29/11 0427  BP:  120/49  109/60  Pulse:  57  62  Temp: 98.4 F (36.9 C)   99 F (37.2 C)  TempSrc: Oral   Oral  Resp:  14  17  Height:   5\' 3"  (1.6 m)   Weight:   61.78 kg (136 lb 3.2 oz)   SpO2:  97%  98%   Weight change:   Intake/Output Summary (Last 24 hours) at 09/29/11 0756 Last data filed at 09/29/11 0600  Gross per 24 hour  Intake  112.5 ml  Output    150 ml  Net  -37.5 ml    General: Alert, awake, oriented x3, in no acute distress.  HEENT: [place]/AT PEERL, EOMI Neck: Trachea midline,  no masses, no thyromegal,y no JVD, no carotid bruit OROPHARYNX:  Moist, No exudate/ erythema/lesions.  Heart: Regular rate and rhythm, without murmurs, rubs, gallops, PMI non-displaced, no heaves or thrills on palpation.  Lungs: Clear to auscultation, no wheezing or rhonchi noted. No increased  vocal fremitus resonant to percussion  Abdomen: Soft, nontender, nondistended, positive bowel sounds, no masses no hepatosplenomegaly noted..  Neuro: No focal neurological deficits noted cranial nerves II through XII grossly intact. DTRs 2+ bilaterally upper and lower extremities. Strength functional in bilateral upper and lower extremities. Musculoskeletal: No warm swelling or erythema around joints, no spinal tenderness noted. Psychiatric: Patient alert and oriented x3, good insight and cognition, good recent to remote recall. Lymph node survey: No cervical axillary or inguinal lymphadenopathy noted.     Lab Results:  [facility] 09/29/11 0453 09/28/11 2156  NA 141 141  K  4.1 3.3*  CL 107 110  CO2 24 21  GLUCOSE 112* 105*  BUN 17 16  CREATININE 0.82 0.65  CALCIUM 9.1 8.4  MG -- --  PHOS -- --    Basename 09/28/11 2156  AST 12  ALT 6  ALKPHOS 32*  BILITOT 0.2*  PROT 5.8*  ALBUMIN 2.9*   No results found for this basename: LIPASE:2,AMYLASE:2 in the last 72 hours  Basename 09/29/11 0453 09/28/11 2156  WBC 11.8* 8.9  NEUTROABS -- 7.0  HGB 9.8* 9.2*  HCT 30.2* 28.2*  MCV 86.0 86.0  PLT 257 224    Basename 09/29/11 0108 09/28/11 2157  CKTOTAL 40 --  CKMB 1.5 --  CKMBINDEX -- --  TROPONINI <0.30 <0.30   No components found with this basename: POCBNP:3 No results found for this basename: DDIMER:2 in the last 72 hours No results found for this basename: HGBA1C:2 in the last 72 hours  Basename 09/29/11 0453  CHOL 164  HDL 59  LDLCALC 78  TRIG 136  CHOLHDL 2.8  LDLDIRECT --   No results found for this basename: TSH,T4TOTAL,FREET3,T3FREE,THYROIDAB in the last 72 hours  Basename 09/29/11 0453  VITAMINB12 --  FOLATE --  FERRITIN --  TIBC --  IRON --  RETICCTPCT 1.1    Micro Results: No results found for this or any previous visit (from the past 240 hour(s)).  Studies/Results: No results found.  Medications: I have reviewed the patient's current medications. Scheduled Meds:   . insulin aspart  0-5 Units Subcutaneous QHS  . insulin aspart  0-9 Units Subcutaneous TID WC  . nitroGLYCERIN  0.5 inch Topical Q6H  . ondansetron      . ondansetron (ZOFRAN) IV  4 mg Intravenous Once  . pantoprazole (PROTONIX) IV  40 mg Intravenous Once  . potassium chloride  40 mEq Oral Once  . DISCONTD: enoxaparin  40 mg Subcutaneous Daily  . DISCONTD: nitroGLYCERIN  0.5 inch Topical Q6H  . DISCONTD: pantoprazole  40 mg Oral Once   Continuous Infusions:   . sodium chloride 50 mL/hr at 09/29/11 0345   PRN Meds:.acetaminophen, acetaminophen, alum & mag hydroxide-simeth, HYDROmorphone, ondansetron (ZOFRAN) IV, ondansetron, oxyCODONE,  zolpidem Assessment/Plan: Patient Active Hospital Problem List: Atrial fibrillation (09/29/2011)   Assessment: The patient is presently in sinus rhythm on telemetry. It is clear that she has paroxysmal atrial fibrillation however she does not want to pursue Coumadin. The patient's heart rate is actually in the 40s to 50 that she would not benefit from a rate limiting medication. Pt could also possibly have intermittent atrial pauses given her significant bradycardia. Will continue to monitor. If no significant findings during this hospitalization may need event monitor.   Plan: I will start the patient on aspirin 81 mg by mouth daily. Continue to monitor on tele.  Anemia (09/29/2011)   Assessment: The patient has known iron deficiency anemia and was seen by  Dr. Arlyce Dice on 05/30/2010  for workup with a subsequent EGD for 06/05/2010.. The patient  at that time was indeed taking Slow Fe and a Bayer aspirin 81 mg daily as Dr. Marzetta Board records show. Colonoscopy was performed on 06/28/2010 which showed severe diverticulosis in the sigmoid colon and moderate diverticulosis in the descending colon, and EGD performed on 05/15/2011 was unremarkable. The patient also had a CT abdomen and pelvis with contrast done on 07/25/2010 which was unremarkable.   Plan: In light of the fact that the patient has had a full workup for her anemia within the past couple years. I feel that the patient's drop in hemoglobin is likely due to the fact that her iron supplementation was discontinued and she actually has low iron stores. In light of the fact she does not want to resume Coumadin I do not think it is necessary to pursue a workup until her iron has been reinstated and a followup appointment with her primary care physician can assess her bodies response to iron in terms of building her RBC stores. The patient is asymptomatic at present and does not require transfusion of packed red blood cells at present.   . LOS: 1 day

## 2011-09-29 NOTE — Consult Note (Addendum)
Referring Physician: Della Goo, MD Primary Physician: Eleonore Chiquito, MD Primary Cardiologist: New Reason for Consultation: Bradycardia   HPI:  [name] is an 78 y/o woman with h/o DM2, HTN, bifascicular block (RBBB & LAFB), severe anxiety, panic disorder and possible early dementia. She has followed with Dr. Amador Cunas.  She denies any h/o known heart disease. She has never had any cardiac studies that she can recall.  She is a widow and now her son lives with her. Says she can get around pretty well without any CP, SOB or dizziness. She no longer drives but says her son takes her to the mall and she can "meander" around without any difficulty.   This evening she developed some lower abdominal pain and her son called EMS and she was brought to the ER. An ECG in the field revealed atrial fibrillation with a normal ventricular rate. On arrival to [facility] ER has been in sinus rhythm with HRs in the 40s and 50s with trifascicular block on ECG. She denies dizziness, presyncope, palpitations. Her SBP has been in the 140 range. In looking back in her clinic notes HRs have ranged in 50-60 range.  She is not on any AV nodal agents but was recently started on Aricept. She denies any weakness to me but apparently the ER doctor got the history that she has been weak for several days.  In the ER potassium was 3.3. Hemoglobin was 9.2 which in down from 11.8 in October 2012. The ER resident checked her stool which was black but guaiac negative.  Review of Systems:     Cardiac Review of Systems: {Y] = yes [ ]  = no  Chest Pain [    ]  Resting SOB [   ] Exertional SOB  [  ]  Orthopnea [  ]   Pedal Edema [   ]    Palpitations [  ] Syncope  [  ]   Presyncope [   ]  General Review of Systems: [Y] = yes [  ]=no Constitional: recent weight change [  ]; anorexia [  ]; fatigue [  ]; nausea [  ]; night sweats [  ]; fever [  ]; or chills [  ];                                                                                                                                            Eye : blurred vision [  ]; diplopia [   ]; vision changes [  ];  Amaurosis fugax[  ]; Resp: cough [  ];  wheezing[  ];  hemoptysis[  ]; shortness of breath[  ]; paroxysmal nocturnal dyspnea[  ]; dyspnea on exertion[  ]; or orthopnea[  ];  GI:  gallstones[  ], vomiting[  ];  dysphagia[  ]; melena[  ];  hematochezia [  ]; heartburn[  ];  GU: kidney stones [  ]; hematuria[  ];   dysuria [  ];  nocturia[  ];  history of     obstruction [  ];                 Skin: rash, swelling[  ];, hair loss[  ];  peripheral edema[  ];  or itching[  ]; Musculosketetal: myalgias[  ];  joint swelling[  ];  joint erythema[  ];  joint pain[  ];  back pain[  ];  Heme/Lymph: bruising[  ];  bleeding[  ];  anemia[ y];  Neuro: TIA[  ];  headaches[  ];  stroke[  ];  vertigo[  ];  seizures[  ];   paresthesias[  ];  difficulty walking[  ];  Psych:depression[  ]; anxiety[ y ];  Endocrine: diabetes[y  ];  thyroid dysfunction[  ];  Other:  Past Medical History  Diagnosis Date  . ANEMIA, DEFICIENCY, HX OF 03/01/2010  . DEPRESSIVE DISORDER 09/19/2010  . DM w/o Complication Type II 05/18/2007  . GERD 05/18/2007  . HYPERLIPIDEMIA 03/17/2008  . HYPERTENSION 05/18/2007  . INSOMNIA 11/23/2008  . Loss of weight 05/30/2010  . Kidney stone   . Glaucoma   . Anxiety     Medications Prior to Admission  Medication Dose Route Frequency Provider Last Rate Last Dose  . 0.9 %  sodium chloride infusion   Intravenous Continuous Ron Parker, MD      . acetaminophen (TYLENOL) tablet 650 mg  650 mg Oral Q6H PRN Ron Parker, MD       Or  . acetaminophen (TYLENOL) suppository 650 mg  650 mg Rectal Q6H PRN Ron Parker, MD      . alum & mag hydroxide-simeth (MAALOX/MYLANTA) 200-200-20 MG/5ML suspension 30 mL  30 mL Oral Q6H PRN Harvette Velora Heckler, MD      . enoxaparin (LOVENOX) injection 40 mg  40 mg Subcutaneous Q24H Harvette C Lovell Sheehan, MD      .  HYDROmorphone (DILAUDID) injection 0.5-1 mg  0.5-1 mg Intravenous Q3H PRN Ron Parker, MD      . nitroGLYCERIN (NITROGLYN) 2 % ointment 0.5 inch  0.5 inch Topical Q6H Harvette Velora Heckler, MD      . ondansetron (ZOFRAN) 4 MG/2ML injection           . ondansetron (ZOFRAN) injection 4 mg  4 mg Intravenous Once Nelia Shi, MD   4 mg at 09/28/11 2158  . ondansetron (ZOFRAN) tablet 4 mg  4 mg Oral Q6H PRN Ron Parker, MD       Or  . ondansetron (ZOFRAN) injection 4 mg  4 mg Intravenous Q6H PRN Ron Parker, MD      . oxyCODONE (Oxy IR/ROXICODONE) immediate release tablet 5 mg  5 mg Oral Q4H PRN Ron Parker, MD      . pantoprazole (PROTONIX) injection 40 mg  40 mg Intravenous Once Warnell Forester, MD   40 mg at 09/29/11 0030  . potassium chloride SA (K-DUR,KLOR-CON) CR tablet 40 mEq  40 mEq Oral Once Ron Parker, MD      . zolpidem (AMBIEN) tablet 5 mg  5 mg Oral QHS PRN Ron Parker, MD      . DISCONTD: nitroGLYCERIN (NITROGLYN) 2 % ointment 0.5 inch  0.5 inch Topical Q6H Harvette Velora Heckler, MD      . DISCONTD: pantoprazole (PROTONIX) EC tablet 40 mg  40 mg Oral Once Warnell Forester, MD  Medications Prior to Admission  Medication Sig Dispense Refill  . ALPRAZolam (XANAX) 0.5 MG tablet Take 0.5 mg by mouth every 6 (six) hours as needed. For anxiety.      Marland Kitchen aluminum & magnesium hydroxide (MAALOX) 225-200 MG/5ML suspension Take 5 mLs by mouth every 6 (six) hours as needed. For heartburn.      Marland Kitchen aspirin 81 MG tablet Take 81 mg by mouth daily.        . furosemide (LASIX) 20 MG tablet Take 20 mg by mouth 2 (two) times daily.       Marland Kitchen LORazepam (ATIVAN) 0.5 MG tablet Take 0.5-1 mg by mouth 2 (two) times daily. Take 1 tablet in the morning, and 2 tablets in the evening.      . metFORMIN (GLUCOPHAGE-XR) 500 MG 24 hr tablet Take 500 mg by mouth at bedtime.       . simvastatin (ZOCOR) 80 MG tablet Take 40 mg by mouth daily.             Marland Kitchen enoxaparin  40 mg  Subcutaneous Q24H  . nitroGLYCERIN  0.5 inch Topical Q6H  . ondansetron      . ondansetron (ZOFRAN) IV  4 mg Intravenous Once  . pantoprazole (PROTONIX) IV  40 mg Intravenous Once  . potassium chloride  40 mEq Oral Once  . DISCONTD: nitroGLYCERIN  0.5 inch Topical Q6H  . DISCONTD: pantoprazole  40 mg Oral Once    Infusions:    . sodium chloride      Allergies  Allergen Reactions  . Amoxicillin     REACTION: unspecified  . Penicillins     REACTION: rash    History   Social History  . Marital Status: Widowed    Spouse Name: N/A    Number of Children: 3  . Years of Education: N/A   Occupational History  . retired    Social History Main Topics  . Smoking status: Never Smoker   . Smokeless tobacco: Never Used  . Alcohol Use: No  . Drug Use: No  . Sexually Active: Not on file   Other Topics Concern  . Not on file   Social History Narrative  . No narrative on file    Family History  Problem Relation Age of Onset  . Brain cancer Father   . Colon cancer Sister   . Colitis Brother     PHYSICAL EXAM: Filed Vitals:   09/29/11 0137  BP:   Pulse:   Temp: 98.4 F (36.9 C)  Resp:   BP 130/105 HR 48  No intake or output data in the 24 hours ending 09/29/11 0131  General:  Elderly. Pale. Mildly anxious.. No respiratory difficulty HEENT: normal except for edentulous Neck: supple. no JVD. Carotids 2+ bilat; no bruits. No lymphadenopathy or thryomegaly appreciated. Cor: PMI nondisplaced. Bradycardic and regular. No rubs, gallops or murmurs. Lungs: clear Abdomen: soft, nontender, nondistended. No hepatosplenomegaly. No bruits or masses. Good bowel sounds. Extremities: no cyanosis, clubbing, rash, edema Neuro: alert & oriented x 3, cranial nerves grossly intact. moves all 4 extremities w/o difficulty. Affect pleasant.  ECG: Sinus bradycardia 59 with RBBB and :AFB and 1AVB (216 ms) QRS 140 ms . No ST-T wave abnormalities.   F/u ECG SB 48.   Results for orders  placed during the hospital encounter of 09/28/11 (from the past 24 hour(s))  CBC     Status: Abnormal   Collection Time   09/28/11  9:56 PM  Component Value Range   WBC 8.9  4.0 - 10.5 (K/uL)   RBC 3.28 (*) 3.87 - 5.11 (MIL/uL)   Hemoglobin 9.2 (*) 12.0 - 15.0 (g/dL)   HCT 16.1 (*) 09.6 - 46.0 (%)   MCV 86.0  78.0 - 100.0 (fL)   MCH 28.0  26.0 - 34.0 (pg)   MCHC 32.6  30.0 - 36.0 (g/dL)   RDW 04.5  40.9 - 81.1 (%)   Platelets 224  150 - 400 (K/uL)  DIFFERENTIAL     Status: Abnormal   Collection Time   09/28/11  9:56 PM      Component Value Range   Neutrophils Relative 79 (*) 43 - 77 (%)   Neutro Abs 7.0  1.7 - 7.7 (K/uL)   Lymphocytes Relative 15  12 - 46 (%)   Lymphs Abs 1.4  0.7 - 4.0 (K/uL)   Monocytes Relative 5  3 - 12 (%)   Monocytes Absolute 0.5  0.1 - 1.0 (K/uL)   Eosinophils Relative 0  0 - 5 (%)   Eosinophils Absolute 0.0  0.0 - 0.7 (K/uL)   Basophils Relative 0  0 - 1 (%)   Basophils Absolute 0.0  0.0 - 0.1 (K/uL)  COMPREHENSIVE METABOLIC PANEL     Status: Abnormal   Collection Time   09/28/11  9:56 PM      Component Value Range   Sodium 141  135 - 145 (mEq/L)   Potassium 3.3 (*) 3.5 - 5.1 (mEq/L)   Chloride 110  96 - 112 (mEq/L)   CO2 21  19 - 32 (mEq/L)   Glucose, Bld 105 (*) 70 - 99 (mg/dL)   BUN 16  6 - 23 (mg/dL)   Creatinine, Ser 9.14  0.50 - 1.10 (mg/dL)   Calcium 8.4  8.4 - 78.2 (mg/dL)   Total Protein 5.8 (*) 6.0 - 8.3 (g/dL)   Albumin 2.9 (*) 3.5 - 5.2 (g/dL)   AST 12  0 - 37 (U/L)   ALT 6  0 - 35 (U/L)   Alkaline Phosphatase 32 (*) 39 - 117 (U/L)   Total Bilirubin 0.2 (*) 0.3 - 1.2 (mg/dL)   GFR calc non Af Amer 78 (*) >90 (mL/min)   GFR calc Af Amer >90  >90 (mL/min)  TROPONIN I     Status: Normal   Collection Time   09/28/11  9:57 PM      Component Value Range   Troponin I <0.30  <0.30 (ng/mL)  URINALYSIS, ROUTINE W REFLEX MICROSCOPIC     Status: Abnormal   Collection Time   09/28/11 10:00 PM      Component Value Range   Color, Urine  YELLOW  YELLOW    APPearance CLOUDY (*) CLEAR    Specific Gravity, Urine 1.014  1.005 - 1.030    pH 8.5 (*) 5.0 - 8.0    Glucose, UA NEGATIVE  NEGATIVE (mg/dL)   Hgb urine dipstick NEGATIVE  NEGATIVE    Bilirubin Urine NEGATIVE  NEGATIVE    Ketones, ur NEGATIVE  NEGATIVE (mg/dL)   Protein, ur NEGATIVE  NEGATIVE (mg/dL)   Urobilinogen, UA 0.2  0.0 - 1.0 (mg/dL)   Nitrite NEGATIVE  NEGATIVE    Leukocytes, UA NEGATIVE  NEGATIVE   OCCULT BLOOD, POC DEVICE     Status: Normal   Collection Time   09/28/11 10:44 PM      Component Value Range   Fecal Occult Bld NEGATIVE     No results found.   ASSESSMENT:  1. Bradycardia, asymptomatic 2. Trifascicular block 3. PAF - new diagnosis     --ChadsVasc = 5  4. Abdominal pain 5. Normocytic anemia with significant drop in Hgb over past 3 months. 6. Anxiety  7. ? Mild dementia - on Aricept  PLAN/DISCUSSION:  She has significant bradycardia in the setting of underlying conduction disease. However, she appears asymptomatic from this though she tells the ER & IM doctors she has been weak. This is confounded by her new onset anemia (Hgb down 2.5 points)  As HR not < 40, she would not have an absolute indication for permanent pacing unless she was clearly symptomatic from her bradycardia -which I think will be hard to tell until we know more about her anemia and what is causing it.  Additionally, her bradycardia may be aggravated by her Aricept as well as vagal stimuli from her ab pain.  Would recommend stopping Aricept and working up her abdominal pain, particularly in light of her dropping Hgb. Would likely be worth having GI see her. Would check a thyroid panel, echo and follow on telemetry. Given her conduction disease would have a low threshold to have EP evaluate her for possible pacing.    Finally, her ECG in the field reveals PAF and given her high ChadsVasc score would likely benefit from anticoagualtion at some point (if she can manage it) but  would hold off for now until her anemia and ab pain is further evaluted. We will follow.   Can d/c NTG paste as she denies any CP and CE and ECG are not suggestive of ischemia.  Daniel Bensimhon,MD 1:55 AM

## 2011-09-29 NOTE — ED Notes (Signed)
Dr. Bensimon at bedside.  

## 2011-09-29 NOTE — H&P (Addendum)
DATE OF ADMISSION:  09/29/2011  PCP:    Rogelia Boga, MD, MD   Chief Complaint: Weakness and Dizziness   HPI: [name] is an 78 y.o. female who presents with  Complaints of severe weakness and dizziness and not feeling well.  She reports epigastric and chest pain and an indigestion like feeling. In the ED she was found to have a heart rate ranging from the 30's to 50s, which was new.  She is not on digoxin or a beta blocker therapy.   Her EKG reveal a RBBB which was present previously.       Past Medical History  Diagnosis Date  . ANEMIA, DEFICIENCY, HX OF 03/01/2010  . DEPRESSIVE DISORDER 09/19/2010  . DM w/o Complication Type II 05/18/2007  . GERD 05/18/2007  . HYPERLIPIDEMIA 03/17/2008  . HYPERTENSION 05/18/2007  . INSOMNIA 11/23/2008  . Loss of weight 05/30/2010  . Kidney stone   . Glaucoma   . Anxiety     Past Surgical History  Procedure Date  . Abdominal hysterectomy   . Cataract extraction   . Hemorrhoid surgery   . Appendectomy     Medications:  HOME MEDS: Prior to Admission medications   Medication Sig Start Date End Date Taking? Authorizing Provider  ALPRAZolam Prudy Feeler) 0.5 MG tablet Take 0.5 mg by mouth every 6 (six) hours as needed. For anxiety. 05/16/11  Yes Rogelia Boga, MD  aluminum & magnesium hydroxide (MAALOX) 225-200 MG/5ML suspension Take 5 mLs by mouth every 6 (six) hours as needed. For heartburn.   Yes Historical Provider, MD  aspirin 81 MG tablet Take 81 mg by mouth daily.     Yes Historical Provider, MD  furosemide (LASIX) 20 MG tablet Take 20 mg by mouth 2 (two) times daily.  03/04/11  Yes Historical Provider, MD  LORazepam (ATIVAN) 0.5 MG tablet Take 0.5-1 mg by mouth 2 (two) times daily. Take 1 tablet in the morning, and 2 tablets in the evening.   Yes Historical Provider, MD  metFORMIN (GLUCOPHAGE-XR) 500 MG 24 hr tablet Take 500 mg by mouth at bedtime.    Yes Historical Provider, MD  simvastatin (ZOCOR) 80 MG tablet Take 40  mg by mouth daily.    Yes Historical Provider, MD    Allergies:  Allergies  Allergen Reactions  . Amoxicillin     REACTION: unspecified  . Penicillins     REACTION: rash    Social History:   reports that she has never smoked. She has never used smokeless tobacco. She reports that she does not drink alcohol or use illicit drugs.  Family History: Family History  Problem Relation Age of Onset  . Brain cancer Father   . Colon cancer Sister   . Colitis Brother     Review of Systems:  The patient denies anorexia, fever, weight loss, vision loss, decreased hearing, hoarseness, chest pain, syncope, dyspnea on exertion, peripheral edema, balance deficits, hemoptysis, abdominal pain, melena, hematochezia, severe indigestion/heartburn, hematuria, incontinence, genital sores, suspicious skin lesions, transient blindness, difficulty walking, depression, unusual weight change, abnormal bleeding, enlarged lymph nodes, angioedema, and breast masses.   Physical Exam:  GEN:  Pleasant 78 year old female examined  and in no acute distress; cooperative with exam Filed Vitals:   09/28/11 2055 09/28/11 2108 09/28/11 2115 09/28/11 2130  BP:  159/59 154/65 145/57  Pulse:  65  64  Temp:  98.2 F (36.8 C)    TempSrc:  Oral    Resp:  18 12 15  SpO2: 97% 100%  99%   Blood pressure 145/57, pulse 64, temperature 98.2 F (36.8 C), temperature source Oral, resp. rate 15, SpO2 99.00%. PSYCH: She is alert and oriented x4; does not appear anxious does not appear depressed; affect is normal HEENT: Normocephalic and Atraumatic, Mucous membranes pink; PERRLA; EOM intact; Fundi:  Benign;  No scleral icterus, Nares: Patent, Oropharynx: Clear, Edentulous or Fair Dentition, Neck:  FROM, no cervical lymphadenopathy nor thyromegaly or carotid bruit; no JVD; Breasts:: Not examined CHEST WALL: No tenderness CHEST: Normal respiration, clear to auscultation bilaterally HEART: Regular rate and rhythm; no murmurs rubs or  gallops BACK: No kyphosis or scoliosis; no CVA tenderness ABDOMEN: Positive Bowel Sounds, Scaphoid, Obese, soft non-tender; no masses, no organomegaly, no pannus; no intertriginous candida. Rectal Exam: Not done EXTREMITIES: No bone or joint deformity; age-appropriate arthropathy of the hands and knees; no cyanosis, clubbing or edema; no ulcerations. Genitalia: not examined PULSES: 2+ and symmetric SKIN: Normal hydration no rash or ulceration CNS: Cranial nerves 2-12 grossly intact no focal neurologic deficit   Labs & Imaging Results for orders placed during the hospital encounter of 09/28/11 (from the past 48 hour(s))  CBC     Status: Abnormal   Collection Time   09/28/11  9:56 PM      Component Value Range Comment   WBC 8.9  4.0 - 10.5 (K/uL)    RBC 3.28 (*) 3.87 - 5.11 (MIL/uL)    Hemoglobin 9.2 (*) 12.0 - 15.0 (g/dL)    HCT 16.1 (*) 09.6 - 46.0 (%)    MCV 86.0  78.0 - 100.0 (fL)    MCH 28.0  26.0 - 34.0 (pg)    MCHC 32.6  30.0 - 36.0 (g/dL)    RDW 04.5  40.9 - 81.1 (%)    Platelets 224  150 - 400 (K/uL)   DIFFERENTIAL     Status: Abnormal   Collection Time   09/28/11  9:56 PM      Component Value Range Comment   Neutrophils Relative 79 (*) 43 - 77 (%)    Neutro Abs 7.0  1.7 - 7.7 (K/uL)    Lymphocytes Relative 15  12 - 46 (%)    Lymphs Abs 1.4  0.7 - 4.0 (K/uL)    Monocytes Relative 5  3 - 12 (%)    Monocytes Absolute 0.5  0.1 - 1.0 (K/uL)    Eosinophils Relative 0  0 - 5 (%)    Eosinophils Absolute 0.0  0.0 - 0.7 (K/uL)    Basophils Relative 0  0 - 1 (%)    Basophils Absolute 0.0  0.0 - 0.1 (K/uL)   COMPREHENSIVE METABOLIC PANEL     Status: Abnormal   Collection Time   09/28/11  9:56 PM      Component Value Range Comment   Sodium 141  135 - 145 (mEq/L)    Potassium 3.3 (*) 3.5 - 5.1 (mEq/L)    Chloride 110  96 - 112 (mEq/L)    CO2 21  19 - 32 (mEq/L)    Glucose, Bld 105 (*) 70 - 99 (mg/dL)    BUN 16  6 - 23 (mg/dL)    Creatinine, Ser 9.14  0.50 - 1.10 (mg/dL)     Calcium 8.4  8.4 - 10.5 (mg/dL)    Total Protein 5.8 (*) 6.0 - 8.3 (g/dL)    Albumin 2.9 (*) 3.5 - 5.2 (g/dL)    AST 12  0 - 37 (U/L)    ALT 6  0 - 35 (U/L)    Alkaline Phosphatase 32 (*) 39 - 117 (U/L)    Total Bilirubin 0.2 (*) 0.3 - 1.2 (mg/dL)    GFR calc non Af Amer 78 (*) >90 (mL/min)    GFR calc Af Amer >90  >90 (mL/min)   TROPONIN I     Status: Normal   Collection Time   09/28/11  9:57 PM      Component Value Range Comment   Troponin I <0.30  <0.30 (ng/mL)   URINALYSIS, ROUTINE W REFLEX MICROSCOPIC     Status: Abnormal   Collection Time   09/28/11 10:00 PM      Component Value Range Comment   Color, Urine YELLOW  YELLOW     APPearance CLOUDY (*) CLEAR     Specific Gravity, Urine 1.014  1.005 - 1.030     pH 8.5 (*) 5.0 - 8.0     Glucose, UA NEGATIVE  NEGATIVE (mg/dL)    Hgb urine dipstick NEGATIVE  NEGATIVE     Bilirubin Urine NEGATIVE  NEGATIVE     Ketones, ur NEGATIVE  NEGATIVE (mg/dL)    Protein, ur NEGATIVE  NEGATIVE (mg/dL)    Urobilinogen, UA 0.2  0.0 - 1.0 (mg/dL)    Nitrite NEGATIVE  NEGATIVE     Leukocytes, UA NEGATIVE  NEGATIVE  MICROSCOPIC NOT DONE ON URINES WITH NEGATIVE PROTEIN, BLOOD, LEUKOCYTES, NITRITE, OR GLUCOSE <1000 mg/dL.  OCCULT BLOOD, POC DEVICE     Status: Normal   Collection Time   09/28/11 10:44 PM      Component Value Range Comment   Fecal Occult Bld NEGATIVE      No results found.    Assessment:  1.  Bradycardia 2.  Weakness 3.  Dizziness 4.  Anemia 5.  Mild Hypokalemia 6.  HTN 7.  DM 2   Plan:    Cardiology consulted, admit to telemetry, and cardiac enzymes ordered.   Check TSH Replete K+. Reconcile Meds.   DVT prophylaxis Other plans as per orders.    CODE STATUS:      FULL CODE         Verlena Marlette C 09/29/2011, 1:04 AM

## 2011-09-29 NOTE — Progress Notes (Signed)
Patient ID: [name], female   DOB: [birth year], 78 y.o.   MRN: [id number] @ Subjective:  Denies SSCP, palpitations or Dyspnea I am cold  Objective:  Filed Vitals:   09/29/11 0137 09/29/11 0253 09/29/11 0325 09/29/11 0427  BP:  120/49  109/60  Pulse:  57  62  Temp: 98.4 F (36.9 C)   99 F (37.2 C)  TempSrc: Oral   Oral  Resp:  14  17  Height:   5\' 3"  (1.6 m)   Weight:   61.78 kg (136 lb 3.2 oz)   SpO2:  97%  98%    Intake/Output from previous day:  Intake/Output Summary (Last 24 hours) at 09/29/11 0848 Last data filed at 09/29/11 0600  Gross per 24 hour  Intake  112.5 ml  Output    150 ml  Net  -37.5 ml    Physical Exam: Pale elderly female Lungs clear S1S2 no murmur Abdomen soft nontender no BM Plus two PT Trace LE edema Neuro nonfocal   Lab Results: Basic Metabolic Panel:  Basename 09/29/11 0453 09/28/11 2156  NA 141 141  K 4.1 3.3*  CL 107 110  CO2 24 21  GLUCOSE 112* 105*  BUN 17 16  CREATININE 0.82 0.65  CALCIUM 9.1 8.4  MG -- --  PHOS -- --   Liver Function Tests:  [facility] 09/28/11 2156  AST 12  ALT 6  ALKPHOS 32*  BILITOT 0.2*  PROT 5.8*  ALBUMIN 2.9*   No results found for this basename: LIPASE:2,AMYLASE:2 in the last 72 hours CBC:  Basename 09/29/11 0453 09/28/11 2156  WBC 11.8* 8.9  NEUTROABS -- 7.0  HGB 9.8* 9.2*  HCT 30.2* 28.2*  MCV 86.0 86.0  PLT 257 224   Cardiac Enzymes:  Basename 09/29/11 0108 09/28/11 2157  CKTOTAL 40 --  CKMB 1.5 --  CKMBINDEX -- --  TROPONINI <0.30 <0.30   BNP: No components found with this basename: POCBNP:3 D-Dimer: No results found for this basename: DDIMER:2 in the last 72 hours Hemoglobin A1C: No results found for this basename: HGBA1C in the last 72 hours Fasting Lipid Panel:  Basename 09/29/11 0453  CHOL 164  HDL 59  LDLCALC 78  TRIG 136  CHOLHDL 2.8  LDLDIRECT --   Thyroid Function Tests: No results found for this basename: TSH,T4TOTAL,FREET3,T3FREE,THYROIDAB in the  last 72 hours Anemia Panel:  Basename 09/29/11 0453  VITAMINB12 --  FOLATE --  FERRITIN --  TIBC --  IRON --  RETICCTPCT 1.1    Imaging: No results found.  Cardiac Studies:  ECG: 6/12  SR 58 RBBB     Telemetry: Sinus bradycardia 47 09/28/11  SB rate 48 RBBB no other change in morphology  Echo: 6/12  Normal EF  Medications:     . insulin aspart  0-5 Units Subcutaneous QHS  . insulin aspart  0-9 Units Subcutaneous TID WC  . nitroGLYCERIN  0.5 inch Topical Q6H  . ondansetron      . ondansetron (ZOFRAN) IV  4 mg Intravenous Once  . pantoprazole (PROTONIX) IV  40 mg Intravenous Once  . potassium chloride  40 mEq Oral Once  . DISCONTD: enoxaparin  40 mg Subcutaneous Daily  . DISCONTD: nitroGLYCERIN  0.5 inch Topical Q6H  . DISCONTD: pantoprazole  40 mg Oral Once       . sodium chloride 50 mL/hr at 09/29/11 0345    Assessment/Plan:  Bradycardia:  Stable no indication for pacer.  Enzyme negative  No change in ECG  morphology since she saw CM In June.  D/C Aricept.  Repeat echo ordered by DB but normal in June  TSH pending Anemia:  And abdominal pain w/u ongoing Abdomen soft nontender today guaic stools  ? Why does she have a nitro patch on??  Charlton Haws 09/29/2011, 8:48 AM

## 2011-09-29 NOTE — ED Notes (Signed)
Dr. Jenkins at bedside. 

## 2011-09-30 ENCOUNTER — Encounter (HOSPITAL_COMMUNITY)
Admission: EM | Disposition: A | Discharge status: Home / Self Care | Payer: Self-pay | Source: Home / Self Care | Attending: Internal Medicine

## 2011-09-30 ENCOUNTER — Other Ambulatory Visit: Payer: Self-pay

## 2011-09-30 DIAGNOSIS — I495 Sick sinus syndrome: Secondary | ICD-10-CM

## 2011-09-30 DIAGNOSIS — I359 Nonrheumatic aortic valve disorder, unspecified: Secondary | ICD-10-CM

## 2011-09-30 DIAGNOSIS — I4891 Unspecified atrial fibrillation: Principal | ICD-10-CM

## 2011-09-30 HISTORY — PX: PERMANENT PACEMAKER INSERTION: SHX5480

## 2011-09-30 LAB — GLUCOSE, CAPILLARY
Glucose-Capillary: 114 mg/dL — ABNORMAL HIGH (ref 70–99)
Glucose-Capillary: 89 mg/dL (ref 70–99)

## 2011-09-30 LAB — BASIC METABOLIC PANEL
CO2: 22 mEq/L (ref 19–32)
Calcium: 9 mg/dL (ref 8.4–10.5)
Chloride: 106 mEq/L (ref 96–112)
Sodium: 140 mEq/L (ref 135–145)

## 2011-09-30 LAB — DIFFERENTIAL
Basophils Absolute: 0 10*3/uL (ref 0.0–0.1)
Lymphocytes Relative: 17 % (ref 12–46)
Monocytes Absolute: 0.5 10*3/uL (ref 0.1–1.0)
Neutro Abs: 9.3 10*3/uL — ABNORMAL HIGH (ref 1.7–7.7)

## 2011-09-30 LAB — CBC
HCT: 30.8 % — ABNORMAL LOW (ref 36.0–46.0)
Hemoglobin: 9.8 g/dL — ABNORMAL LOW (ref 12.0–15.0)
RDW: 14.6 % (ref 11.5–15.5)
WBC: 11.8 10*3/uL — ABNORMAL HIGH (ref 4.0–10.5)

## 2011-09-30 LAB — MAGNESIUM: Magnesium: 2 mg/dL (ref 1.5–2.5)

## 2011-09-30 SURGERY — PERMANENT PACEMAKER INSERTION
Anesthesia: LOCAL

## 2011-09-30 MED ORDER — CHLORHEXIDINE GLUCONATE 4 % EX LIQD
60.0000 mL | Freq: Once | CUTANEOUS | Status: AC
  Administered 2011-09-30: 4 via TOPICAL
  Filled 2011-09-30: qty 60

## 2011-09-30 MED ORDER — ACETAMINOPHEN 325 MG PO TABS
325.0000 mg | ORAL_TABLET | ORAL | Status: DC | PRN
  Administered 2011-09-30: 650 mg via ORAL
  Filled 2011-09-30: qty 2

## 2011-09-30 MED ORDER — LORAZEPAM 0.5 MG PO TABS: 0.5000 mg | ORAL_TABLET | Freq: Two times a day (BID) | ORAL | Status: DC

## 2011-09-30 MED ORDER — ALPRAZOLAM 0.5 MG PO TABS: 0.5000 mg | ORAL_TABLET | Freq: Two times a day (BID) | ORAL | Status: DC | PRN

## 2011-09-30 MED ORDER — METOPROLOL TARTRATE 1 MG/ML IV SOLN
INTRAVENOUS | Status: AC
  Filled 2011-09-30: qty 5

## 2011-09-30 MED ORDER — LORAZEPAM 0.5 MG PO TABS
0.5000 mg | ORAL_TABLET | ORAL | Status: DC
  Filled 2011-09-30: qty 1

## 2011-09-30 MED ORDER — ONDANSETRON HCL 4 MG/2ML IJ SOLN: 4.0000 mg | Freq: Four times a day (QID) | INTRAMUSCULAR | Status: DC | PRN

## 2011-09-30 MED ORDER — MIDAZOLAM HCL 2 MG/2ML IJ SOLN
INTRAMUSCULAR | Status: AC
  Filled 2011-09-30: qty 2

## 2011-09-30 MED ORDER — SODIUM CHLORIDE 0.45 % IV SOLN
INTRAVENOUS | Status: DC
  Administered 2011-09-30: 13:00:00 via INTRAVENOUS

## 2011-09-30 MED ORDER — CHLORHEXIDINE GLUCONATE 4 % EX LIQD: 60.0000 mL | Freq: Once | CUTANEOUS | Status: DC

## 2011-09-30 MED ORDER — HEPARIN (PORCINE) IN NACL 2-0.9 UNIT/ML-% IJ SOLN
INTRAMUSCULAR | Status: AC
  Filled 2011-09-30: qty 1000

## 2011-09-30 MED ORDER — LORAZEPAM 1 MG PO TABS
1.0000 mg | ORAL_TABLET | Freq: Every day | ORAL | Status: DC
  Administered 2011-09-30: 1 mg via ORAL
  Filled 2011-09-30: qty 1

## 2011-09-30 MED ORDER — FENTANYL CITRATE 0.05 MG/ML IJ SOLN
INTRAMUSCULAR | Status: AC
  Filled 2011-09-30: qty 2

## 2011-09-30 MED ORDER — VANCOMYCIN HCL IN DEXTROSE 1-5 GM/200ML-% IV SOLN
1000.0000 mg | INTRAVENOUS | Status: DC
  Filled 2011-09-30: qty 200

## 2011-09-30 MED ORDER — METFORMIN HCL ER 500 MG PO TB24
500.0000 mg | ORAL_TABLET | Freq: Every day | ORAL | Status: DC
  Administered 2011-09-30: 500 mg via ORAL
  Filled 2011-09-30 (×2): qty 1

## 2011-09-30 MED ORDER — LIDOCAINE HCL (PF) 1 % IJ SOLN
INTRAMUSCULAR | Status: AC
  Filled 2011-09-30: qty 60

## 2011-09-30 MED ORDER — SODIUM CHLORIDE 0.9 % IV SOLN: INTRAVENOUS | Status: DC

## 2011-09-30 MED ORDER — SODIUM CHLORIDE 0.9 % IR SOLN
80.0000 mg | Status: DC
  Filled 2011-09-30: qty 2

## 2011-09-30 MED ORDER — FUROSEMIDE 20 MG PO TABS
20.0000 mg | ORAL_TABLET | Freq: Two times a day (BID) | ORAL | Status: DC
  Administered 2011-09-30 – 2011-10-01 (×2): 20 mg via ORAL
  Filled 2011-09-30 (×4): qty 1

## 2011-09-30 MED ORDER — VANCOMYCIN HCL IN DEXTROSE 1-5 GM/200ML-% IV SOLN
1000.0000 mg | Freq: Two times a day (BID) | INTRAVENOUS | Status: AC
  Administered 2011-10-01: 1000 mg via INTRAVENOUS
  Filled 2011-09-30: qty 200

## 2011-09-30 NOTE — Progress Notes (Signed)
   CARE MANAGEMENT NOTE 09/30/2011  Patient:  [name], [name]   Account Number:  [id number]  Date Initiated:  09/30/2011  Documentation initiated by:  Letha Cape  Subjective/Objective Assessment:   dx afib  admit- lives with son     Action/Plan:   cards following , plan is for a pacemaker.   Anticipated DC Date:  10/02/2011   Anticipated DC Plan:  HOME W HOME HEALTH SERVICES      DC Planning Services  CM consult      Choice offered to / List presented to:             Status of service:  In process, will continue to follow Medicare Important Message given?   (If response is "NO", the following Medicare IM given date fields will be blank) Date Medicare IM given:   Date Additional Medicare IM given:    Discharge Disposition:    Per UR Regulation:    Comments:  PCP Eleonore Chiquito  09/30/11 15:38 Letha Cape RN, BSN [phone] Paitent lives with son, patient had a 6 second pause, Cards following, plan is for pacemaker.

## 2011-09-30 NOTE — Op Note (Signed)
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 W#[id number].

## 2011-09-30 NOTE — H&P (Signed)
  Subjective:  No chest pain or sob.  Objective:  Vital Signs in the last 24 hours: Temp:  [97.9 F (36.6 C)-98.6 F (37 C)] 97.9 F (36.6 C) (01/28 1330) Pulse Rate:  [43-51] 46  (01/28 1330) Resp:  [18-19] 18  (01/28 1330) BP: (94-141)/(45-64) 141/59 mmHg (01/28 1330) SpO2:  [95 %-100 %] 99 % (01/28 1330)  Intake/Output from previous day: 01/27 0701 - 01/28 0700 In: 1200 [P.O.:600; I.V.:600] Out: 600 [Urine:600] Intake/Output from this shift: Total I/O In: 360 [P.O.:360] Out: -   Physical Exam: Elderly, diskempt appearing woman, NAD HEENT: Unremarkable Neck:  No JVD, no thyromegally Lymphatics:  No adenopathy Back:  No CVA tenderness Lungs:  Clear with no wheezes. HEART:  Regular brady rhythm, no murmurs, no rubs, no clicks Abd:  Flat, positive bowel sounds, no organomegally, no rebound, no guarding Ext:  2 plus pulses, no edema, no cyanosis, no clubbing Skin:  No rashes no nodules Neuro:  CN II through XII intact, motor grossly intact  Lab Results:  Basename 09/30/11 0812 09/29/11 0453  WBC 11.8* 11.8*  HGB 9.8* 9.8*  PLT 242 257    Basename 09/30/11 0812 09/29/11 0453  NA 140 141  K 4.1 4.1  CL 106 107  CO2 22 24  GLUCOSE 119* 112*  BUN 16 17  CREATININE 0.76 0.82    Basename 09/29/11 1716 09/29/11 0920  TROPONINI <0.30 <0.30   Hepatic Function Panel  Basename 09/28/11 2156  PROT 5.8*  ALBUMIN 2.9*  AST 12  ALT 6  ALKPHOS 32*  BILITOT 0.2*  BILIDIR --  IBILI --    Basename 09/29/11 0453  CHOL 164   No results found for this basename: PROTIME in the last 72 hours  Imaging: No results found.  Cardiac Studies: Tele - sinus brady with a 26 second pause (nocturnal) Assessment/Plan:  1. Symptomatic bradycardia 2. HTN 3. Dementia - mild Rec: I have discussed the risks/benefits/goals/expectations of PPM with the patient and her family and she wishes to proceed.  LOS: 2 days    Lewayne Bunting 09/30/2011, 1:36 PM

## 2011-09-30 NOTE — Progress Notes (Signed)
Subjective: Pt had a 26 second pause last night with symptoms that required the staff shaking her awake. Pt was seen by cardiology and they will see her today for possible pacemaker placement. Presently patient assymptomatic.  [facility] Vitals:   09/29/11 2340 09/30/11 0418 09/30/11 1005 09/30/11 1330  BP: 112/60 94/45 138/64 141/59  Pulse: 43 51 50 46  Temp: 98.5 F (36.9 C) 98.6 F (37 C) 98.4 F (36.9 C) 97.9 F (36.6 C)  TempSrc: Oral Oral Oral Oral  Resp: 18 19 18 18   Height:      Weight:      SpO2: 95% 97% 100% 99%   Weight change:   Intake/Output Summary (Last 24 hours) at 09/30/11 1614 Last data filed at 09/30/11 1300  Gross per 24 hour  Intake   1080 ml  Output    600 ml  Net    480 ml   General: Alert, awake, oriented x3, in no acute distress.  HEENT: [place]/AT PEERL, EOMI  Neck: Trachea midline, no masses, no thyromegal,y no JVD, no carotid bruit  OROPHARYNX: Moist, No exudate/ erythema/lesions.  Heart: Regular rate and rhythm, without murmurs, rubs, gallops, PMI non-displaced, no heaves or thrills on palpation.  Lungs: Clear to auscultation, no wheezing or rhonchi noted. No increased vocal fremitus resonant to percussion  Abdomen: Soft, nontender, nondistended, positive bowel sounds, no masses no hepatosplenomegaly noted..  Neuro: No focal neurological deficits noted cranial nerves II through XII grossly intact. DTRs 2+ bilaterally upper and lower extremities. Strength functional in bilateral upper and lower extremities.  Musculoskeletal: No warm swelling or erythema around joints, no spinal tenderness noted.  Psychiatric: Patient alert and oriented x3, good insight and cognition, good recent to remote recall.  Lymph node survey: No cervical axillary or inguinal lymphadenopathy noted.    Lab Results:  [facility] 09/30/11 0812 09/29/11 0453  NA 140 141  K 4.1 4.1  CL 106 107  CO2 22 24  GLUCOSE 119* 112*  BUN 16 17  CREATININE 0.76 0.82  CALCIUM 9.0 9.1   MG 2.0 --  PHOS -- --    Basename 09/28/11 2156  AST 12  ALT 6  ALKPHOS 32*  BILITOT 0.2*  PROT 5.8*  ALBUMIN 2.9*   No results found for this basename: LIPASE:2,AMYLASE:2 in the last 72 hours  Basename 09/30/11 0812 09/29/11 0453 09/28/11 2156  WBC 11.8* 11.8* --  NEUTROABS 9.3* -- 7.0  HGB 9.8* 9.8* --  HCT 30.8* 30.2* --  MCV 87.7 86.0 --  PLT 242 257 --    Basename 09/29/11 1716 09/29/11 0920 09/29/11 0108  CKTOTAL 54 46 40  CKMB 1.7 1.6 1.5  CKMBINDEX -- -- --  TROPONINI <0.30 <0.30 <0.30   No components found with this basename: POCBNP:3 No results found for this basename: DDIMER:2 in the last 72 hours  Basename 09/29/11 0453  HGBA1C 6.2*    Basename 09/29/11 0453  CHOL 164  HDL 59  LDLCALC 78  TRIG 136  CHOLHDL 2.8  LDLDIRECT --    Basename 09/29/11 0453  TSH 3.200  T4TOTAL --  T3FREE 2.9  THYROIDAB --    Basename 09/29/11 0453  VITAMINB12 393  FOLATE >20.0  FERRITIN 14  TIBC 364  IRON 62  RETICCTPCT 1.1    Micro Results: No results found for this or any previous visit (from the past 240 hour(s)).  Studies/Results: No results found.  Medications: I have reviewed the patient's current medications. Scheduled Meds:    . chlorhexidine  60 mL Topical Once  . fentaNYL      . ferrous sulfate  325 mg Oral BID WC  . gentamicin irrigation  80 mg Irrigation On Call  . heparin      . insulin aspart  0-5 Units Subcutaneous QHS  . insulin aspart  0-9 Units Subcutaneous TID WC  . lidocaine      . midazolam      . vancomycin  1,000 mg Intravenous On Call  . DISCONTD: chlorhexidine  60 mL Topical Once  . DISCONTD: nitroGLYCERIN  0.5 inch Topical Q6H   Continuous Infusions:    . sodium chloride 50 mL/hr at 09/30/11 1328  . sodium chloride 50 mL/hr at 09/29/11 2237  . sodium chloride 50 mL/hr at 09/30/11 1039   PRN Meds:.acetaminophen, acetaminophen, alum & mag hydroxide-simeth, HYDROmorphone, ondansetron (ZOFRAN) IV, ondansetron,  oxyCODONE, DISCONTD: zolpidem Assessment/Plan: Patient Active Hospital Problem List: Atrial Pause (09/30/2011)   Assessment: Pt will likely go for pacemaker placement today. Will confer with cardiology.  Anemia (09/29/2011)   Assessment: The patient has known iron deficiency anemia and was seen by Dr. Arlyce Dice on 05/30/2010  for workup with a subsequent EGD for 06/05/2010.. The patient  at that time was indeed taking Slow Fe and a Bayer aspirin 81 mg daily as Dr. Marzetta Board records show. Colonoscopy was performed on 06/28/2010 which showed severe diverticulosis in the sigmoid colon and moderate diverticulosis in the descending colon, and EGD performed on 05/15/2011 was unremarkable. The patient also had a CT abdomen and pelvis with contrast done on 07/25/2010 which was unremarkable.   Plan: Continue Iron.   . LOS: 2 days

## 2011-09-30 NOTE — Progress Notes (Signed)
2D Echo completed.  [name], RDCS 

## 2011-09-30 NOTE — Consult Note (Signed)
ELECTROPHYSIOLOGY CONSULT NOTE  Primary Care Physician: Rogelia Boga, MD, MD Referring Physician:  Dr Eden Emms  Admit Date: 09/28/2011  Reason for consultation:  bradycardia  [name] is a 78 y.o. female with a h/o DM2, HTN, bifascicular block (RBBB & LAFB),and possible early dementia. She presented with afib with a controlled ventricular rate.  Due to bradycardia, her aricept was discontinued.  She has had persistent bradycardia.  She reports symptoms of fatigue.  She also reports "dizziness" with doing routine housework.  SHe denies CP, SOB or presyncope or syncope. She no longer drives but says her son takes her to the mall.  She remains reasonably active despite her age.  She is not on any AV nodal agents and her aricept was discontinued.  Last night, she had a 26 second pause.  She was asleep during this pause and her heart rate improved upon being aroused by staff.   Past Medical History  Diagnosis Date  . ANEMIA, DEFICIENCY, HX OF 03/01/2010  . DEPRESSIVE DISORDER 09/19/2010  . DM w/o Complication Type II 05/18/2007  . GERD 05/18/2007  . HYPERLIPIDEMIA 03/17/2008  . HYPERTENSION 05/18/2007  . INSOMNIA 11/23/2008  . Loss of weight 05/30/2010  . Kidney stone   . Glaucoma   . Anxiety    Past Surgical History  Procedure Date  . Abdominal hysterectomy   . Cataract extraction   . Hemorrhoid surgery   . Appendectomy        . ferrous sulfate  325 mg Oral BID WC  . insulin aspart  0-5 Units Subcutaneous QHS  . insulin aspart  0-9 Units Subcutaneous TID WC  . DISCONTD: nitroGLYCERIN  0.5 inch Topical Q6H      . sodium chloride 50 mL/hr at 09/29/11 2237    Allergies  Allergen Reactions  . Amoxicillin     REACTION: unspecified  . Penicillins     REACTION: rash    History   Social History  . Marital Status: Widowed    Spouse Name: N/A    Number of Children: 3  . Years of Education: N/A   Occupational History  . retired    Social History Main Topics    . Smoking status: Never Smoker   . Smokeless tobacco: Never Used  . Alcohol Use: No  . Drug Use: No  . Sexually Active: Not on file   Other Topics Concern  . Not on file   Social History Narrative  . No narrative on file    Family History  Problem Relation Age of Onset  . Brain cancer Father   . Colon cancer Sister   . Colitis Brother     ROS- All systems are reviewed and negative except as per the HPI above  Physical Exam: Telemetry: Filed Vitals:   09/29/11 1327 09/29/11 2015 09/29/11 2340 09/30/11 0418  BP: 96/51 135/63 112/60 94/45  Pulse: 51 45 43 51  Temp: 98.3 F (36.8 C) 98.3 F (36.8 C) 98.5 F (36.9 C) 98.6 F (37 C)  TempSrc: Oral Oral Oral Oral  Resp: 18 18 18 19   Height:      Weight:      SpO2: 99% 97% 95% 97%    GEN- The patient is well appearing, alert and oriented x 3 today.   Head- normocephalic, atraumatic Eyes-  Sclera clear, conjunctiva pink Ears- hearing intact Oropharynx- clear Neck- supple, no JVP Lymph- no cervical lymphadenopathy Lungs- Clear to ausculation bilaterally, normal work of breathing Heart- Regular rate and rhythm, no  murmurs, rubs or gallops, PMI not laterally displaced GI- soft, NT, ND, + BS Extremities- no clubbing, cyanosis, or edema MS- no significant deformity or atrophy Skin- no rash or lesion Psych- euthymic mood, full affect Neuro- strength and sensation are intact  EKG- sinus bradycardia,RBBB, LAHB  Labs:   Lab Results  Component Value Date   WBC 11.8* 09/29/2011   HGB 9.8* 09/29/2011   HCT 30.2* 09/29/2011   MCV 86.0 09/29/2011   PLT 257 09/29/2011    Lab 09/29/11 0453 09/28/11 2156  NA 141 --  K 4.1 --  CL 107 --  CO2 24 --  BUN 17 --  CREATININE 0.82 --  CALCIUM 9.1 --  PROT -- 5.8*  BILITOT -- 0.2*  ALKPHOS -- 32*  ALT -- 6  AST -- 12  GLUCOSE 112* --   Lab Results  Component Value Date   CKTOTAL 54 09/29/2011   CKMB 1.7 09/29/2011   TROPONINI <0.30 09/29/2011    Lab Results   Component Value Date   CHOL 164 09/29/2011   CHOL 161 03/08/2011   CHOL 186 02/10/2011   Lab Results  Component Value Date   HDL 59 09/29/2011   HDL 57 04/03/9561   HDL 64 02/10/2011   Lab Results  Component Value Date   LDLCALC 78 09/29/2011   LDLCALC 58 03/08/2011   LDLCALC 87 02/10/2011   Lab Results  Component Value Date   TRIG 136 09/29/2011   TRIG 230* 03/08/2011   TRIG 176* 02/10/2011   Lab Results  Component Value Date   CHOLHDL 2.8 09/29/2011   CHOLHDL 2.8 03/08/2011   CHOLHDL 2.9 02/10/2011   Lab Results  Component Value Date   LDLDIRECT 129.4 06/01/2009   LDLDIRECT 135.1 03/17/2008      Echo:  pending  ASSESSMENT AND PLAN:   1. Bradycardia The patient was admitted with afib with a controlled ventricular response.  She remains bradycardic off of AV nodal agents and also off of aricept.  She had a 26 second pause while sleeping overnight.   The patient has symptomatic bradycardia.  I would therefore recommend pacemaker implantation at this time.  Risks, benefits, alternatives to pacemaker implantation were discussed in detail with the patient today. The patient understands that the risks include but are not limited to bleeding, infection, pneumothorax, perforation, tamponade, vascular damage, renal failure, MI, stroke, death,  and lead dislodgement and wishes to proceed. We will therefore schedule the procedure at the next available time.  2. Afib Rate controlled, presently in sinus She has declined coumadin.    Hillis Range, MD 09/30/2011  8:03 AM

## 2011-09-30 NOTE — Progress Notes (Signed)
Telemetry clerk notified staff that patient rang Asystole and had a significant pause. Staff ran to room and shook patient to arouse her. Patient slightly diaphoretic. Patient was in a deep sleep due to being given 5mg  ambien po qhs per her request. Patient's vital signs were stable BP 112/60, RR18, Temp 98.5, O2 Sats 95 on room air and HR came back up to the 40s. CBG was 113. Notified Lenny Pastel, NP on-call, discussed the event and was told to contact the [facility] Cardiologist on-call and make them aware. Dr. Katha Cabal was notified and told of the event and came to review the rhythm strips. Orders were received. EKG showed Sinus Bradycardia with possible premature atrial complexes with aberrant conduction and RBBB. Removed nitro patch and placed pacemaker pads on patient. Will continue to monitor.

## 2011-09-30 NOTE — Significant Event (Signed)
On-call MD note.  78 y/o female patient admitted for bradycardia.  She received Ambien 5 mg tonight.  Subsequently, she was noted to have asystole on cardiac monitor greater than 6 second.  On closer evaluation, she has high grade AV block for a prolonged period of time.  She was shaken by nursing staff, patient woke up and was in sinus bradycardia.  Currently her vital sign BP 112/60, pulse rate 45 bpm, temp 98.5 finger stick blood glucose 113.  Recommend: 1.  Subcutaneous pacer overnight 2. D/C Ambien 3. D/C Nitroglycerine 4. Discuss with team in the morning about evaluation for a pacemaker.  Blossom Hoops, M.D.

## 2011-09-30 NOTE — Progress Notes (Signed)
Utilization review complete 

## 2011-10-01 ENCOUNTER — Other Ambulatory Visit: Payer: Self-pay

## 2011-10-01 ENCOUNTER — Inpatient Hospital Stay (HOSPITAL_COMMUNITY): Payer: Medicare Other

## 2011-10-01 DIAGNOSIS — Z950 Presence of cardiac pacemaker: Secondary | ICD-10-CM | POA: Diagnosis not present

## 2011-10-01 DIAGNOSIS — R001 Bradycardia, unspecified: Secondary | ICD-10-CM | POA: Diagnosis present

## 2011-10-01 LAB — GLUCOSE, CAPILLARY: Glucose-Capillary: 113 mg/dL — ABNORMAL HIGH (ref 70–99)

## 2011-10-01 MED ORDER — ALPRAZOLAM 0.25 MG PO TABS
0.2500 mg | ORAL_TABLET | Freq: Once | ORAL | Status: AC | PRN
  Administered 2011-10-01: 0.25 mg via ORAL
  Filled 2011-10-01: qty 1

## 2011-10-01 MED ORDER — FERROUS SULFATE 325 (65 FE) MG PO TABS: 325.0000 mg | ORAL_TABLET | Freq: Two times a day (BID) | ORAL | Status: DC

## 2011-10-01 NOTE — Op Note (Signed)
NAMEVYLA, PINT NO.:  [id number]  MEDICAL RECORD NO.:  [id number]  LOCATION:  2007                         FACILITY:  [facility]  PHYSICIAN:  Doylene Canning. Ladona Ridgel, MD    DATE OF BIRTH:  Dec 27, 1922  DATE OF PROCEDURE:  09/30/2011 DATE OF DISCHARGE:                              OPERATIVE REPORT   PROCEDURE PERFORMED:  Insertion of a dual-chamber pacemaker.  INDICATION:  Symptomatic bradycardia with long pauses.  INTRODUCTION:  The patient is an 78 year old woman with a history of recurrent near syncope and dizziness.  She is subsequently found to be bradycardic and had pauses of up to 26 seconds on telemetry monitoring. She is now referred for dual-chamber pacemaker.  PROCEDURE:  After informed consent was obtained, the patient was taken to the diagnostic EP lab in a fasting state.  After usual preparation and draping, intravenous fentanyl and midazolam were given for sedation. A 30 mL of lidocaine was infiltrated into the left infraclavicular region.  A 5-cm incision was carried out over this region. Electrocautery was utilized to dissect down to the fascial plane.  The left subclavian vein was punctured x2 and the [employer] Dextrus model [id number], active fixation pacing lead, serial #[id number] was advanced into the right ventricle, and the Dextrus model 4135 [employer]), serial [id number] was advanced into the right atrium. Mapping was first carried out in the right ventricle and at the final site, the R-waves measured 8 mV, the pacing impedance was 900 ohms, and threshold 0.4 V at 0.5 msec.  A 10 V pacing did not stimulate the diaphragm.  There was a large injury current with active fixation of the lead.  With the ventricular lead in satisfactory position, attention was then turned to placing the atrial lead which was placed in anterolateral portion of the right atrium where P-waves measured 3 mV.  The pacing impedance with lead actively fixed was 400  ohms and the threshold was 0.7 V at 0.5 msec.  Again, 10 V pacing did not stimulate the diaphragm. With both the atrial and ventricular leads in satisfactory position, they were secured to the subpectoralis fascia with a figure-of-eight silk suture.  Sewing sleeve was secured with silk suture. Electrocautery was utilized to make subcutaneous pocket.  Antibiotic irrigation was utilized to irrigate the pocket.  Electrocautery was utilized to assure hemostasis.  The [employer] Advantio dual- chamber pacemaker, serial [id number] was connected to the atrial and RV leads and placed back in the subcutaneous pocket.  The pocket was irrigated with antibiotic irrigation.  The incision was closed with 2-0 and 3-0 Vicryl.  Benzoin and Steri-Strips were painted on the skin, pressure dressing was applied, and the patient was returned to her room in satisfactory condition.  COMPLICATIONS:  There were no immediate procedure complications.  RESULTS:  Demonstrate successful implantation of a [employer] dual-chamber pacemaker in a patient with symptomatic bradycardia and pauses of up to 26 seconds.     Doylene Canning. Ladona Ridgel, MD     GWT/MEDQ  D:  09/30/2011  T:  10/01/2011  Job:  [id number]  cc:   Hillis Range, MD

## 2011-10-01 NOTE — Progress Notes (Signed)
Patient ID: [name], female   DOB: March 11, 1923, 78 y.o.   MRN: [id number] Subjective:  No chest pain or sob.  Objective:  Vital Signs in the last 24 hours: Temp:  [97.9 F (36.6 C)-98.8 F (37.1 C)] 98.2 F (36.8 C) (01/29 0500) Pulse Rate:  [46-63] 60  (01/29 0500) Resp:  [18] 18  (01/29 0500) BP: (119-161)/(50-95) 145/66 mmHg (01/29 0500) SpO2:  [96 %-100 %] 96 % (01/29 0500)  Intake/Output from previous day: 01/28 0701 - 01/29 0700 In: 1520 [P.O.:1320; IV Piggyback:200] Out: 850 [Urine:850] Intake/Output from this shift:    Physical Exam: Well appearing elderly woman, NAD HEENT: Unremarkable Neck:  No JVD, no thyromegally Lungs:  Clear with no wheezes. No PPM hematoma. HEART:  Regular rate rhythm, no murmurs, no rubs, no clicks Abd:  Flat, positive bowel sounds, no organomegally, no rebound, no guarding Ext:  2 plus pulses, no edema, no cyanosis, no clubbing Skin:  No rashes no nodules Neuro:  CN II through XII intact, motor grossly intact  Lab Results:  Basename 09/30/11 0812 09/29/11 0453  WBC 11.8* 11.8*  HGB 9.8* 9.8*  PLT 242 257    Basename 09/30/11 0812 09/29/11 0453  NA 140 141  K 4.1 4.1  CL 106 107  CO2 22 24  GLUCOSE 119* 112*  BUN 16 17  CREATININE 0.76 0.82    Basename 09/29/11 1716 09/29/11 0920  TROPONINI <0.30 <0.30   Hepatic Function Panel  Basename 09/28/11 2156  PROT 5.8*  ALBUMIN 2.9*  AST 12  ALT 6  ALKPHOS 32*  BILITOT 0.2*  BILIDIR --  IBILI --    Basename 09/29/11 0453  CHOL 164   No results found for this basename: PROTIME in the last 72 hours  Imaging: Dg Chest 2 View  10/01/2011  *RADIOLOGY REPORT*  Clinical Data: Status post pacer insertion  CHEST - 2 VIEW  Comparison: , 05/14/2011  Findings: There is a left chest wall pacer device with leads in the right atrial appendage and right ventricle.  There is no pleural effusion or pulmonary edema.  No airspace consolidation identified.  Review of the visualized  osseous structures is unremarkable.  IMPRESSION:  1.  No active cardiopulmonary abnormalities.  Original Report Authenticated By: Rosealee Albee, M.D.    Cardiac Studies:  Assessment/Plan:  1. Symptomatic bradycardia  2. S/P PPM 3. HTN Rec: ok to discharge today. Usual followup and post op activity.  LOS: 3 days    Lewayne Bunting 10/01/2011, 9:21 AM

## 2011-10-01 NOTE — Discharge Summary (Signed)
[name] MRN: [id number] DOB/AGE: 1923-01-31 78 y.o.  Admit date: 09/28/2011 Discharge date: 10/01/2011  Primary Care Physician:  Rogelia Boga, MD, MD   Discharge Diagnoses:   Patient Active Problem List  Diagnoses  . DM w/o Complication Type II  . HYPERLIPIDEMIA  . HYPERTENSION  . GERD  . INSOMNIA  . LOSS OF WEIGHT  . OTHER ABNORMAL BLOOD CHEMISTRY  . ANEMIA, DEFICIENCY, HX OF  . DEPRESSIVE DISORDER  . Chest pain, atypical  . Leg pain  . Epigastric pain  . Pain disorder  . Atrial fibrillation  . Symptomatic bradycardia  . Status post cardiac pacemaker procedure    DISCHARGE MEDICATION: Medication List  As of 10/01/2011  1:20 PM   TAKE these medications         ALPRAZolam 0.5 MG tablet   Commonly known as: XANAX   Take 0.5 mg by mouth every 6 (six) hours as needed. For anxiety.      aluminum & magnesium hydroxide 225-200 MG/5ML suspension   Commonly known as: MAALOX   Take 5 mLs by mouth every 6 (six) hours as needed. For heartburn.      aspirin 81 MG tablet   Take 81 mg by mouth daily.      furosemide 20 MG tablet   Commonly known as: LASIX   Take 20 mg by mouth 2 (two) times daily.      LORazepam 0.5 MG tablet   Commonly known as: ATIVAN   Take 0.5-1 mg by mouth 2 (two) times daily. Take 1 tablet in the morning, and 2 tablets in the evening.      metFORMIN 500 MG 24 hr tablet   Commonly known as: GLUCOPHAGE-XR   Take 500 mg by mouth at bedtime.      simvastatin 80 MG tablet   Commonly known as: ZOCOR   Take 40 mg by mouth daily.      Ferrous sulfate 325 mg tablet   Commonly known as Iron Take 325 mg 2 (two) times daily with food                 Consults:     SIGNIFICANT DIAGNOSTIC STUDIES:  Dg Chest 2 View  10/01/2011  *RADIOLOGY REPORT*  Clinical Data: Status post pacer insertion  CHEST - 2 VIEW  Comparison: , 05/14/2011  Findings: There is a left chest wall pacer device with leads in the right atrial appendage and right  ventricle.  There is no pleural effusion or pulmonary edema.  No airspace consolidation identified.  Review of the visualized osseous structures is unremarkable.  IMPRESSION:  1.  No active cardiopulmonary abnormalities.  Original Report Authenticated By: Rosealee Albee, M.D.     ECHO:   - Left ventricle: The cavity size was normal. Wall thickness was normal. The estimated ejection fraction was 55%. Regional wall motion abnormalities cannot be excluded. - Aortic valve: Mild regurgitation. - Mitral valve: Mild regurgitation.    CARDIAC CATH & OTHER PROCEDURES:  No results found for this or any previous visit (from the past 240 hour(s)).  BRIEF ADMITTING H & P: [name] is an 78 y.o. female who presents with Complaints of severe weakness and dizziness and not feeling well. She reports epigastric and chest pain and an indigestion like feeling. In the ED she was found to have a heart rate ranging from the 30's to 50s, which was new. She is not on digoxin or a beta blocker therapy. Her EKG reveal a RBBB which  was present previously.     Hospital Course:  Present on Admission:  .Symptomatic bradycardia: Pt was admitted with near syncope and RBBB. It was intially unclear as to if pt was having PAF or AVB.However, during the course of her hospitalization, [name] has a 26 second atrial pause. This necessitated a pacemaker and patient had a DDDPPM implanted in the Left Subclavian on 09/30/2011. Pt is to follow-up with Dr. Johney Frame on 10/10/11 for pacemaker wound check.  .Anemia: pt was found to have an anemia which from all records is chronic. Pt should continue on her oral iron and F/U with Dr. Lesia Hausen.  Disposition and Follow-up: F/U with PMD in 1 week. F/U with [facility]  Cardiology on 10/10/11 Discharge Orders    Future Appointments: Provider: Department: Dept Phone: Center:   10/10/2011 3:00 PM Vella Kohler [facility] [phone] [facility]   10/29/2011 1:45 PM Rogelia Boga, MD [facility] [phone] [facility]     Future Orders Please Complete By Expires   Diet Carb Modified      Scheduling Instructions:   Heart Healthy   Increase activity slowly         DISCHARGE EXAM:  General: Alert, awake, oriented x3, in no acute distress.  HEENT: [place]/AT PEERL, EOMI  Neck: Trachea midline, no masses, no thyromegal,y no JVD, no carotid bruit  OROPHARYNX: Moist, No exudate/ erythema/lesions.  Heart: Regular rate and rhythm, without murmurs, rubs, gallops, PMI non-displaced, no heaves or thrills on palpation.  Lungs: Clear to auscultation, no wheezing or rhonchi noted. No increased vocal fremitus resonant to percussion  Abdomen: Soft, nontender, nondistended, positive bowel sounds, no masses no hepatosplenomegaly noted..  Neuro: No focal neurological deficits noted cranial nerves II through XII grossly intact. DTRs 2+ bilaterally upper and lower extremities. Strength functional in bilateral upper and lower extremities.  Musculoskeletal: No warm swelling or erythema around joints, no spinal tenderness noted.  Psychiatric: Patient alert and oriented x3, good insight and cognition, good recent to remote recall.  Lymph node survey: No cervical axillary or inguinal lymphadenopathy noted.  Blood pressure 145/66, pulse 60, temperature 98.2 F (36.8 C), temperature source Oral, resp. rate 18, height 5\' 3"  (1.6 m), weight 61.78 kg (136 lb 3.2 oz), SpO2 96.00%.   Basename 09/30/11 0812 09/29/11 0453  NA 140 141  K 4.1 4.1  CL 106 107  CO2 22 24  GLUCOSE 119* 112*  BUN 16 17  CREATININE 0.76 0.82  CALCIUM 9.0 9.1  MG 2.0 --  PHOS -- --    Basename 09/28/11 2156  AST 12  ALT 6  ALKPHOS 32*  BILITOT 0.2*  PROT 5.8*  ALBUMIN 2.9*   No results found for this basename: LIPASE:2,AMYLASE:2 in the last 72 hours  Basename 09/30/11 0812 09/29/11 0453 09/28/11 2156  WBC 11.8* 11.8* --  NEUTROABS 9.3* -- 7.0  HGB 9.8* 9.8* --  HCT 30.8* 30.2* --    MCV 87.7 86.0 --  PLT 242 257 --   Total time for D/C process including face to face time approximately 37 minutes. Signed: MATTHEWS,MICHELLE A. 10/01/2011, 1:20 PM

## 2011-10-10 ENCOUNTER — Ambulatory Visit (INDEPENDENT_AMBULATORY_CARE_PROVIDER_SITE_OTHER): Payer: Medicare Other | Admitting: *Deleted

## 2011-10-10 ENCOUNTER — Encounter: Payer: Self-pay | Admitting: Internal Medicine

## 2011-10-10 DIAGNOSIS — R001 Bradycardia, unspecified: Secondary | ICD-10-CM

## 2011-10-10 DIAGNOSIS — I498 Other specified cardiac arrhythmias: Secondary | ICD-10-CM

## 2011-10-10 LAB — PACEMAKER DEVICE OBSERVATION
AL THRESHOLD: 1.2 V
DEVICE MODEL PM: 123684
RV LEAD THRESHOLD: 0.6 V

## 2011-10-10 NOTE — Progress Notes (Signed)
Wound check-PPM 

## 2011-10-15 ENCOUNTER — Other Ambulatory Visit: Payer: Self-pay | Admitting: Internal Medicine

## 2011-10-18 ENCOUNTER — Ambulatory Visit (INDEPENDENT_AMBULATORY_CARE_PROVIDER_SITE_OTHER): Payer: Medicare Other | Admitting: Internal Medicine

## 2011-10-18 ENCOUNTER — Encounter: Payer: Self-pay | Admitting: Internal Medicine

## 2011-10-18 DIAGNOSIS — I498 Other specified cardiac arrhythmias: Secondary | ICD-10-CM

## 2011-10-18 DIAGNOSIS — Z950 Presence of cardiac pacemaker: Secondary | ICD-10-CM

## 2011-10-18 DIAGNOSIS — Z862 Personal history of diseases of the blood and blood-forming organs and certain disorders involving the immune mechanism: Secondary | ICD-10-CM

## 2011-10-18 DIAGNOSIS — E119 Type 2 diabetes mellitus without complications: Secondary | ICD-10-CM

## 2011-10-18 DIAGNOSIS — I10 Essential (primary) hypertension: Secondary | ICD-10-CM

## 2011-10-18 DIAGNOSIS — R001 Bradycardia, unspecified: Secondary | ICD-10-CM

## 2011-10-18 MED ORDER — ALPRAZOLAM 0.5 MG PO TABS: 0.5000 mg | ORAL_TABLET | Freq: Four times a day (QID) | ORAL | Status: DC | PRN

## 2011-10-18 MED ORDER — LORAZEPAM 0.5 MG PO TABS: 0.5000 mg | ORAL_TABLET | Freq: Two times a day (BID) | ORAL | Status: DC

## 2011-10-18 NOTE — Progress Notes (Signed)
  Subjective:    Patient ID: [name], female    DOB: 1923/04/03, 78 y.o.   MRN: [id number]  HPI  78 year old patient who is seen today in followup. She has a history of type 2 diabetes which has been well-controlled on oral therapy. She was hospitalized recently for symptomatic bradycardia and is status post permanent pacemaker insertion. Hemoglobin A1c was checked in late January and was nicely controlled. She has a history of an anxiety disorder which also has been quite stable. She has dyslipidemia and a history of iron deficiency anemia she is on iron supplements at this time. She feels quite well. She is accompanied by her son who feels she is stable    Review of Systems  Constitutional: Negative.   HENT: Negative for hearing loss, congestion, sore throat, rhinorrhea, dental problem, sinus pressure and tinnitus.   Eyes: Negative for pain, discharge and visual disturbance.  Respiratory: Negative for cough and shortness of breath.   Cardiovascular: Negative for chest pain, palpitations and leg swelling.  Gastrointestinal: Negative for nausea, vomiting, abdominal pain, diarrhea, constipation, blood in stool and abdominal distention.  Genitourinary: Negative for dysuria, urgency, frequency, hematuria, flank pain, vaginal bleeding, vaginal discharge, difficulty urinating, vaginal pain and pelvic pain.  Musculoskeletal: Positive for gait problem (Complaining of leg pain). Negative for joint swelling and arthralgias.  Skin: Negative for rash.  Neurological: Negative for dizziness, syncope, speech difficulty, weakness, numbness and headaches.  Hematological: Negative for adenopathy.  Psychiatric/Behavioral: Negative for behavioral problems, dysphoric mood and agitation. The patient is not nervous/anxious.        Objective:   Physical Exam  Constitutional: She is oriented to person, place, and time. She appears well-developed and well-nourished.  HENT:  Head: Normocephalic.  Right  Ear: External ear normal.  Left Ear: External ear normal.  Mouth/Throat: Oropharynx is clear and moist.  Eyes: Conjunctivae and EOM are normal. Pupils are equal, round, and reactive to light.  Neck: Normal range of motion. Neck supple. No thyromegaly present.  Cardiovascular: Normal rate, regular rhythm, normal heart sounds and intact distal pulses.   Pulmonary/Chest: Effort normal and breath sounds normal.  Abdominal: Soft. Bowel sounds are normal. She exhibits no mass. There is no tenderness.  Genitourinary:       Blood pressure 110/74  Musculoskeletal: Normal range of motion.  Lymphadenopathy:    She has no cervical adenopathy.  Neurological: She is alert and oriented to person, place, and time.  Skin: Skin is warm and dry. No rash noted.  Psychiatric: She has a normal mood and affect. Her behavior is normal.          Assessment & Plan:   Hypertension stable Type 2 diabetes stable Status post current pacemaker insertion for symptomatic bradycardia History of anemia. We'll continue iron supplementation we'll recheck CBC next R. OV in 2 months

## 2011-10-18 NOTE — Patient Instructions (Signed)
Limit your sodium (Salt) intake    It is important that you exercise regularly, at least 20 minutes 3 to 4 times per week.  If you develop chest pain or shortness of breath seek  medical attention.   Please check your hemoglobin A1c every 3 months   

## 2011-10-29 ENCOUNTER — Ambulatory Visit: Payer: Medicare Other | Admitting: Internal Medicine

## 2011-10-30 ENCOUNTER — Other Ambulatory Visit: Payer: Self-pay | Admitting: Internal Medicine

## 2011-11-05 ENCOUNTER — Encounter: Payer: Self-pay | Admitting: Internal Medicine

## 2011-11-05 ENCOUNTER — Ambulatory Visit (INDEPENDENT_AMBULATORY_CARE_PROVIDER_SITE_OTHER): Payer: Medicare Other | Admitting: Internal Medicine

## 2011-11-05 ENCOUNTER — Other Ambulatory Visit: Payer: Self-pay | Admitting: Internal Medicine

## 2011-11-05 DIAGNOSIS — E119 Type 2 diabetes mellitus without complications: Secondary | ICD-10-CM

## 2011-11-05 DIAGNOSIS — M79606 Pain in leg, unspecified: Secondary | ICD-10-CM

## 2011-11-05 DIAGNOSIS — I10 Essential (primary) hypertension: Secondary | ICD-10-CM

## 2011-11-05 DIAGNOSIS — M79609 Pain in unspecified limb: Secondary | ICD-10-CM

## 2011-11-05 DIAGNOSIS — Z862 Personal history of diseases of the blood and blood-forming organs and certain disorders involving the immune mechanism: Secondary | ICD-10-CM

## 2011-11-05 LAB — CBC WITH DIFFERENTIAL/PLATELET
Basophils Absolute: 0 10*3/uL (ref 0.0–0.1)
Basophils Relative: 0.3 % (ref 0.0–3.0)
Hemoglobin: 11.5 g/dL — ABNORMAL LOW (ref 12.0–15.0)
Lymphocytes Relative: 18.2 % (ref 12.0–46.0)
Monocytes Relative: 5.2 % (ref 3.0–12.0)
Neutro Abs: 8.2 10*3/uL — ABNORMAL HIGH (ref 1.4–7.7)
Neutrophils Relative %: 75.6 % (ref 43.0–77.0)
RBC: 3.93 Mil/uL (ref 3.87–5.11)
WBC: 10.8 10*3/uL — ABNORMAL HIGH (ref 4.5–10.5)

## 2011-11-05 NOTE — Progress Notes (Signed)
  Subjective:    Patient ID: [name], female    DOB: [birth year], 78 y.o.   MRN: [id number]  HPI  78 year old patient who is in today for followup. She continues to do quite well. Her anxiety disorder is greatly improved. She has a history of mild anemia and is seen today for followup. She is taking iron twice daily. She has type 2 diabetes which has been well-controlled. Last hemoglobin A1c revealed excellent glycemic control    Review of Systems  Constitutional: Negative.   HENT: Negative for hearing loss, congestion, sore throat, rhinorrhea, dental problem, sinus pressure and tinnitus.   Eyes: Negative for pain, discharge and visual disturbance.  Respiratory: Negative for cough and shortness of breath.   Cardiovascular: Negative for chest pain, palpitations and leg swelling.  Gastrointestinal: Negative for nausea, vomiting, abdominal pain, diarrhea, constipation, blood in stool and abdominal distention.  Genitourinary: Negative for dysuria, urgency, frequency, hematuria, flank pain, vaginal bleeding, vaginal discharge, difficulty urinating, vaginal pain and pelvic pain.  Musculoskeletal: Negative for joint swelling, arthralgias (complains of bilateral leg pain) and gait problem.  Skin: Negative for rash.  Neurological: Negative for dizziness, syncope, speech difficulty, weakness, numbness and headaches.  Hematological: Negative for adenopathy.  Psychiatric/Behavioral: Negative for behavioral problems, dysphoric mood and agitation. The patient is nervous/anxious.        Objective:   Physical Exam  Constitutional: She is oriented to person, place, and time. She appears well-developed and well-nourished.  HENT:  Head: Normocephalic.  Right Ear: External ear normal.  Left Ear: External ear normal.  Mouth/Throat: Oropharynx is clear and moist.  Eyes: Conjunctivae and EOM are normal. Pupils are equal, round, and reactive to light.  Neck: Normal range of motion. Neck supple. No  thyromegaly present.  Cardiovascular: Normal rate, regular rhythm, normal heart sounds and intact distal pulses.   Pulmonary/Chest: Effort normal and breath sounds normal.  Abdominal: Soft. Bowel sounds are normal. She exhibits no mass. There is no tenderness.  Musculoskeletal: Normal range of motion.  Lymphadenopathy:    She has no cervical adenopathy.  Neurological: She is alert and oriented to person, place, and time.  Skin: Skin is warm and dry. No rash noted.  Psychiatric: She has a normal mood and affect. Her behavior is normal.          Assessment & Plan:   Diabetes mellitus stable. Anemia. We'll continue iron supplements. We'll check a CBC today. Recheck 3 months

## 2011-11-05 NOTE — Patient Instructions (Signed)
Please check your hemoglobin A1c every 3 months  Limit your sodium (Salt) intake   

## 2011-11-06 NOTE — Progress Notes (Signed)
Quick Note:  Spoke with son , richard - informed labs normal , keep taking iron as usual. KIK ______

## 2011-11-22 ENCOUNTER — Ambulatory Visit (INDEPENDENT_AMBULATORY_CARE_PROVIDER_SITE_OTHER): Payer: Medicare Other | Admitting: Internal Medicine

## 2011-11-22 ENCOUNTER — Encounter: Payer: Self-pay | Admitting: Internal Medicine

## 2011-11-22 VITALS — BP 106/60 | HR 100 | Temp 98.1°F | Resp 16 | Wt 136.0 lb

## 2011-11-22 DIAGNOSIS — E119 Type 2 diabetes mellitus without complications: Secondary | ICD-10-CM

## 2011-11-22 DIAGNOSIS — M79609 Pain in unspecified limb: Secondary | ICD-10-CM

## 2011-11-22 DIAGNOSIS — R52 Pain, unspecified: Secondary | ICD-10-CM

## 2011-11-22 DIAGNOSIS — R51 Headache: Secondary | ICD-10-CM

## 2011-11-22 DIAGNOSIS — I10 Essential (primary) hypertension: Secondary | ICD-10-CM

## 2011-11-22 NOTE — Patient Instructions (Signed)
Please check your hemoglobin A1c every 3 months  Take 650-100 mg of Tylenol every 6 hours as needed for pain relief or fever.  Avoid taking more than 3000 mg in a 24-hour period (  This may cause liver damage).  Call or return to clinic prn if these symptoms worsen or fail to improve as anticipated.

## 2011-11-22 NOTE — Progress Notes (Signed)
  Subjective:    Patient ID: [name], female    DOB: 08-Jul-1923, 78 y.o.   MRN: [id number]  HPI Wt Readings from Last 3 Encounters:  11/22/11 136 lb (61.689 kg)  11/05/11 138 lb (62.596 kg)  10/18/11 136 lb (61.689 kg)   An 78 year old patient who has multiple medical problems including diabetes anxiety disorder and depression. She was seen by ophthalmology earlier today with a 3 to four-week history of headaches. There was some concern about possible temporal arteritis. The patient denies any visual disturbances. Her weight has been stable. She remains generally weak but diaphragmatic change in his symptom complex. Her son does feel that she perhaps has a little bit more of a difficult time ambulating from room to room. No change in her appetite or other significant constitutional complaints she does have a history of weight loss in the past but weight has been fairly stable recently; denies any muscle pain or overt weakness. Again her chief complaint is diffuse headache   Review of Systems  Constitutional: Positive for fatigue.  HENT: Negative for hearing loss, congestion, sore throat, rhinorrhea, dental problem, sinus pressure and tinnitus.   Eyes: Negative for pain, discharge and visual disturbance.  Respiratory: Negative for cough and shortness of breath.   Cardiovascular: Negative for chest pain, palpitations and leg swelling.  Gastrointestinal: Negative for nausea, vomiting, abdominal pain, diarrhea, constipation, blood in stool and abdominal distention.  Genitourinary: Negative for dysuria, urgency, frequency, hematuria, flank pain, vaginal bleeding, vaginal discharge, difficulty urinating, vaginal pain and pelvic pain.  Musculoskeletal: Positive for gait problem. Negative for joint swelling and arthralgias.  Skin: Negative for rash.  Neurological: Positive for weakness and headaches. Negative for dizziness, syncope, speech difficulty and numbness.  Hematological: Negative for  adenopathy.  Psychiatric/Behavioral: Negative for behavioral problems, dysphoric mood and agitation. The patient is not nervous/anxious.        Objective:   Physical Exam  Constitutional: She is oriented to person, place, and time. She appears well-developed and well-nourished.       No distress. Able to stand from a sitting position and transfer with minimal assistance to the examining table  HENT:  Head: Normocephalic.  Right Ear: External ear normal.  Left Ear: External ear normal.  Mouth/Throat: Oropharynx is clear and moist.       No temporal artery tortuosity or tenderness  Eyes: Conjunctivae and EOM are normal. Pupils are equal, round, and reactive to light.  Neck: Normal range of motion. Neck supple. No thyromegaly present.  Cardiovascular: Normal rate, regular rhythm, normal heart sounds and intact distal pulses.   Pulmonary/Chest: Effort normal and breath sounds normal.  Abdominal: Soft. Bowel sounds are normal. She exhibits no mass. There is no tenderness.  Musculoskeletal: Normal range of motion. She exhibits tenderness.  Lymphadenopathy:    She has no cervical adenopathy.  Neurological: She is alert and oriented to person, place, and time.  Skin: Skin is warm and dry. No rash noted.  Psychiatric: She has a normal mood and affect. Her behavior is normal.          Assessment & Plan:   Headache syndrome. We'll check a ESR. Low suspicion for temporal arteritis but we'll treat with a shot of Depo-Medrol and  followup on the sedimentation rate on Monday. We'll follow closely clinically Diabetes mellitus Anxiety disorder with multiple somatic complaints

## 2011-11-25 ENCOUNTER — Telehealth: Payer: Self-pay | Admitting: *Deleted

## 2011-11-25 DIAGNOSIS — R51 Headache: Secondary | ICD-10-CM

## 2011-11-25 MED ORDER — METHYLPREDNISOLONE ACETATE 80 MG/ML IJ SUSP
80.0000 mg | Freq: Once | INTRAMUSCULAR | Status: AC
  Administered 2011-11-25: 80 mg via INTRAMUSCULAR

## 2011-11-25 NOTE — Telephone Encounter (Signed)
Please call pt or son with Sed Rate results from Friday.

## 2011-11-25 NOTE — Telephone Encounter (Signed)
Spoke with son - sed rate WNL

## 2011-11-25 NOTE — Progress Notes (Signed)
Addended by: Noralee Stain D on: 11/25/2011 10:58 AM   Modules accepted: Orders

## 2011-12-11 ENCOUNTER — Telehealth: Payer: Self-pay | Admitting: Internal Medicine

## 2011-12-11 ENCOUNTER — Telehealth: Payer: Self-pay | Admitting: *Deleted

## 2011-12-11 NOTE — Telephone Encounter (Signed)
Son calls stating pt is complaining of SOB again, but does not seem to be SOB.  Advised to let Cardiology know since she does have a pacemaker, and her appt with the pyschiatrist is tomorrow so he will also speak to him.

## 2011-12-11 NOTE — Telephone Encounter (Signed)
Called and spoke with patients son.  She is not having trouble breathing.  She feels like she can't take a deep breath. Per the son that has been going on for a while. Son very unclear as to what is going on. Son does not see any sign of anything wrong with her.  Her BP is 90/60  She is c/o headaches, legs hurting. Saw her PCP 3 days ago and everything checked out okay.  Nurse at Dr Charm Rings office told son to tell the doctor tomorrow that she is complaining of these things.  Dr Donell Beers is seeing her tomorrow at 2:30.  She states she is scared.  He thinks she is scared of dying.  "She goes off when she is in pain." (gets very upset-wants the son to come sit beside her and hold her hand)  He does not feel like this is her heart either, but just wanted to touch base with her doctor here.

## 2011-12-11 NOTE — Telephone Encounter (Signed)
New msg Pt's son called about his mom has been having some sob and said she is telling him that she cant take a deep breath. He wants to know if it could be related to her pacemaker. Please call

## 2011-12-12 NOTE — Telephone Encounter (Signed)
Noted - doctor out of office until Monday - ER or can be seen by another provider if urgent

## 2011-12-27 ENCOUNTER — Telehealth: Payer: Self-pay | Admitting: *Deleted

## 2011-12-27 NOTE — Telephone Encounter (Signed)
Pt's BP is in the 100/60'2, and son is calling saying she shows no difference in the way she physically is looking.  Encouraged fluids, gator aid, power aid, etc.  Explained Dr. Kirtland Bouchard is off this afternoon, but I would let his nurse know this, and she would call him back if she had anything to add to this.

## 2011-12-27 NOTE — Telephone Encounter (Signed)
noted 

## 2011-12-30 NOTE — Telephone Encounter (Signed)
Please check on patient's status today

## 2011-12-30 NOTE — Telephone Encounter (Signed)
Spoke with son - Bp better this wkend - today was 92 /72 She is doing ok , legs ache - probably arthritis . KIk

## 2012-01-06 ENCOUNTER — Other Ambulatory Visit: Payer: Self-pay | Admitting: Internal Medicine

## 2012-01-09 ENCOUNTER — Encounter: Payer: Self-pay | Admitting: Internal Medicine

## 2012-01-09 ENCOUNTER — Ambulatory Visit (INDEPENDENT_AMBULATORY_CARE_PROVIDER_SITE_OTHER): Payer: Medicare Other | Admitting: Internal Medicine

## 2012-01-09 VITALS — BP 123/62 | HR 60 | Resp 18 | Ht 64.0 in | Wt 138.0 lb

## 2012-01-09 DIAGNOSIS — I4891 Unspecified atrial fibrillation: Secondary | ICD-10-CM

## 2012-01-09 DIAGNOSIS — M79609 Pain in unspecified limb: Secondary | ICD-10-CM

## 2012-01-09 DIAGNOSIS — I498 Other specified cardiac arrhythmias: Secondary | ICD-10-CM

## 2012-01-09 DIAGNOSIS — M79606 Pain in leg, unspecified: Secondary | ICD-10-CM

## 2012-01-09 DIAGNOSIS — Z950 Presence of cardiac pacemaker: Secondary | ICD-10-CM

## 2012-01-09 DIAGNOSIS — R001 Bradycardia, unspecified: Secondary | ICD-10-CM

## 2012-01-09 LAB — PACEMAKER DEVICE OBSERVATION: AL THRESHOLD: 0.9 V

## 2012-01-09 NOTE — Patient Instructions (Signed)
Your physician wants you to follow-up in: Jan 2013 You will receive a reminder letter in the mail two months in advance. If you don't receive a letter, please call our office to schedule the follow-up appointment.  

## 2012-01-09 NOTE — Assessment & Plan Note (Signed)
Her device is working normally. We'll plan to recheck in several months. 

## 2012-01-09 NOTE — Assessment & Plan Note (Signed)
This appears to be her biggest problem. Based on her age, she could have spinal stenosis, or claudication. Her pulses are intact. I will defer additional workup to Dr.Kwiatkowski.

## 2012-01-09 NOTE — Assessment & Plan Note (Signed)
She appears to be maintaining sinus rhythm. She has brief nonsustained episodes of atrial tachycardia. No change in medical therapy at this time.

## 2012-01-09 NOTE — Progress Notes (Signed)
HPI [name] returns today for followup. She is a very pleasant 78 year old woman with a history of symptomatic tachybradycardia syndrome, status post permanent pacemaker insertion. She has diastolic heart failure which is well compensated. Her biggest complaint today is pain in her legs when she walks. She notes that she has numbness and tingling. This improves with rest. Allergies  Allergen Reactions  . Amoxicillin     REACTION: unspecified  . Penicillins     REACTION: rash     Current Outpatient Prescriptions  Medication Sig Dispense Refill  . aluminum & magnesium hydroxide (MAALOX) 225-200 MG/5ML suspension Take 5 mLs by mouth every 6 (six) hours as needed. For heartburn.      Marland Kitchen aspirin 81 MG tablet Take 81 mg by mouth daily.        . ferrous sulfate (FERROUSUL) 325 (65 FE) MG tablet Take 1 tablet (325 mg total) by mouth 2 (two) times daily with a meal.  30 tablet  11  . furosemide (LASIX) 20 MG tablet TAKE 1 TABLET ONCE A DAY TO TWICE A DAY TO CONTROL SWELLING  60 tablet  2  . LORazepam (ATIVAN) 0.5 MG tablet Take 1-2 tablets (0.5-1 mg total) by mouth 2 (two) times daily. Take 1 tablet in the morning, and 2 tablets in the evening.  90 tablet  2  . metFORMIN (GLUCOPHAGE-XR) 500 MG 24 hr tablet TAKE 1 TABLET BY MOUTH EVERY DAY  90 tablet  3  . simvastatin (ZOCOR) 80 MG tablet TAKE 1/2 TABLET BY MOUTH EVERY DAY  45 tablet  2  . ALPRAZolam (XANAX) 0.5 MG tablet Take 1 tablet (0.5 mg total) by mouth every 6 (six) hours as needed. For anxiety.  60 tablet  3     Past Medical History  Diagnosis Date  . ANEMIA, DEFICIENCY, HX OF 03/01/2010  . DEPRESSIVE DISORDER 09/19/2010  . DM w/o Complication Type II 05/18/2007  . GERD 05/18/2007  . HYPERLIPIDEMIA 03/17/2008  . HYPERTENSION 05/18/2007  . INSOMNIA 11/23/2008  . Loss of weight 05/30/2010  . Kidney stone   . Glaucoma   . Anxiety     ROS:   All systems reviewed and negative except as noted in the HPI.   Past Surgical History    Procedure Date  . Abdominal hysterectomy   . Cataract extraction   . Hemorrhoid surgery   . Appendectomy   . Pacemaker insertion 09/2011  . Dilation and curettage of uterus      Family History  Problem Relation Age of Onset  . Brain cancer Father   . Colon cancer Sister   . Colitis Brother   . Heart attack Brother      History   Social History  . Marital Status: Widowed    Spouse Name: N/A    Number of Children: 3  . Years of Education: N/A   Occupational History  . retired    Social History Main Topics  . Smoking status: Never Smoker   . Smokeless tobacco: Never Used  . Alcohol Use: No  . Drug Use: No  . Sexually Active: Not on file   Other Topics Concern  . Not on file   Social History Narrative  . No narrative on file     BP 123/62  Pulse 60  Resp 18  Ht 5\' 4"  (1.626 m)  Wt 138 lb (62.596 kg)  BMI 23.69 kg/m2  Physical Exam:  Well appearing elderly woman, NAD HEENT: Unremarkable Neck:  No JVD, no thyromegally Lungs:  Clear with no wheezes, rales, or rhonchi. HEART:  Regular rate rhythm, no murmurs, no rubs, no clicks Abd:  soft, positive bowel sounds, no organomegally, no rebound, no guarding Ext:  2 plus pulses, no edema, no cyanosis, no clubbing Skin:  No rashes no nodules Neuro:  CN II through XII intact, motor grossly intact  DEVICE  Normal device function.  See PaceArt for details.   Assess/Plan:

## 2012-01-13 ENCOUNTER — Telehealth: Payer: Self-pay | Admitting: Internal Medicine

## 2012-01-13 NOTE — Telephone Encounter (Signed)
Attempt to call- VM - LMTCB if questions can discuss at appt on Thursday - may be r/t diabetes - if worse - to see - dont wait till appt

## 2012-01-13 NOTE — Telephone Encounter (Signed)
Pt is coming in for ov on 01/16/12. Pt says that they are cold all the time and both legs hurt all the time. Tried to get pt to come in sooner, but they said that they wanted to wait. Req that nurse call them back.

## 2012-01-14 ENCOUNTER — Telehealth: Payer: Self-pay | Admitting: Internal Medicine

## 2012-01-14 NOTE — Telephone Encounter (Signed)
Joey from [employer] to call pt's son and take care of transmission problems. Pt aware that Joey will be calling.

## 2012-01-14 NOTE — Telephone Encounter (Signed)
Please return call to patient son [name] [phone], regarding device

## 2012-01-14 NOTE — Telephone Encounter (Signed)
Pt's son calling back , pt's son can only be reached until 5p, pls call [phone]

## 2012-01-15 ENCOUNTER — Ambulatory Visit (INDEPENDENT_AMBULATORY_CARE_PROVIDER_SITE_OTHER): Payer: Medicare Other | Admitting: Internal Medicine

## 2012-01-15 ENCOUNTER — Telehealth: Payer: Self-pay | Admitting: Internal Medicine

## 2012-01-15 ENCOUNTER — Encounter: Payer: Self-pay | Admitting: Internal Medicine

## 2012-01-15 VITALS — BP 130/74 | Temp 98.4°F | Wt 136.0 lb

## 2012-01-15 DIAGNOSIS — I4891 Unspecified atrial fibrillation: Secondary | ICD-10-CM

## 2012-01-15 DIAGNOSIS — M79609 Pain in unspecified limb: Secondary | ICD-10-CM

## 2012-01-15 DIAGNOSIS — I10 Essential (primary) hypertension: Secondary | ICD-10-CM

## 2012-01-15 DIAGNOSIS — IMO0001 Reserved for inherently not codable concepts without codable children: Secondary | ICD-10-CM

## 2012-01-15 DIAGNOSIS — M79606 Pain in leg, unspecified: Secondary | ICD-10-CM

## 2012-01-15 DIAGNOSIS — D509 Iron deficiency anemia, unspecified: Secondary | ICD-10-CM

## 2012-01-15 DIAGNOSIS — E119 Type 2 diabetes mellitus without complications: Secondary | ICD-10-CM

## 2012-01-15 NOTE — Patient Instructions (Signed)
Limit your sodium (Salt) intake   Please check your hemoglobin A1c every 3 months   

## 2012-01-15 NOTE — Telephone Encounter (Signed)
Pt stated when she was in for her office visit she forgot to talk to DR. K about getting on medication to calm her nerves. Please contact pt

## 2012-01-16 ENCOUNTER — Ambulatory Visit: Payer: Medicare Other | Admitting: Internal Medicine

## 2012-01-16 ENCOUNTER — Encounter: Payer: Self-pay | Admitting: Internal Medicine

## 2012-01-16 NOTE — Progress Notes (Signed)
  Subjective:    Patient ID: [name], female    DOB: 09/16/1922, 78 y.o.   MRN: [id number]  HPI  78 year old patient who is seen today for followup. She has history of type 2 diabetes which has been managed on metformin. She has maintained very nice glycemic control. She has a history also of symptomatic bradycardia arrhythmia and is status post pacemaker insertion. She has treated hypertension and dyslipidemia. For the past 3 months she has had some calf pain. This is aggravated by ambulation. She has multiple other somatic complaints. She has a history of anxiety disorder which has been reasonably stable   Review of Systems  Constitutional: Negative.   HENT: Negative for hearing loss, congestion, sore throat, rhinorrhea, dental problem, sinus pressure and tinnitus.   Eyes: Negative for pain, discharge and visual disturbance.  Respiratory: Negative for cough and shortness of breath.   Cardiovascular: Negative for chest pain, palpitations and leg swelling.  Gastrointestinal: Negative for nausea, vomiting, abdominal pain, diarrhea, constipation, blood in stool and abdominal distention.  Genitourinary: Negative for dysuria, urgency, frequency, hematuria, flank pain, vaginal bleeding, vaginal discharge, difficulty urinating, vaginal pain and pelvic pain.  Musculoskeletal: Positive for gait problem. Negative for joint swelling and arthralgias.  Skin: Negative for rash.  Neurological: Positive for light-headedness and headaches. Negative for dizziness, syncope, speech difficulty, weakness and numbness.  Hematological: Negative for adenopathy.  Psychiatric/Behavioral: Positive for behavioral problems. Negative for dysphoric mood and agitation. The patient is nervous/anxious.        Objective:   Physical Exam  Constitutional: She is oriented to person, place, and time. She appears well-developed and well-nourished.  HENT:  Head: Normocephalic.  Right Ear: External ear normal.  Left Ear:  External ear normal.  Mouth/Throat: Oropharynx is clear and moist.  Eyes: Conjunctivae and EOM are normal. Pupils are equal, round, and reactive to light.  Neck: Normal range of motion. Neck supple. No thyromegaly present.  Cardiovascular: Normal rate, regular rhythm and normal heart sounds.        Dorsalis pedis pulse is intact. Posterior tibial pulses difficult to palpate. No ischemic changes. Feet appear to be very well-perfused  Pulmonary/Chest: Effort normal and breath sounds normal.  Abdominal: Soft. Bowel sounds are normal. She exhibits no mass. There is no tenderness.  Musculoskeletal: Normal range of motion.  Lymphadenopathy:    She has no cervical adenopathy.  Neurological: She is alert and oriented to person, place, and time.  Skin: Skin is warm and dry. No rash noted.  Psychiatric: She has a normal mood and affect. Her behavior is normal.          Assessment & Plan:   Diabetes mellitus type 2. We'll check a hemoglobin A1c Hypertension stable Anxiety disorder Claudication. The patient has had symptoms for approximately 3 months. Options discussed with the patient and her son. The possibility of spinal stenosis and neurogenic claudication discussed. The patient does not feel pain is severe enough to warrant evaluation and therapeutic intervention. If symptoms intensify we'll consider lumbar MRI. The patient does not wish to pursue further evaluation at this time. Doubt vascular claudication

## 2012-01-16 NOTE — Telephone Encounter (Signed)
Please advise 

## 2012-01-16 NOTE — Telephone Encounter (Signed)
-  Continue alprazolam as needed 

## 2012-01-16 NOTE — Telephone Encounter (Signed)
Spoke with richard - gave dr. Vernon Prey instructions - he states that they were told not to use that because of using ativan - i instructed him to call dr. Avel Sensor and discuss meds to help with nerves episodes

## 2012-01-17 ENCOUNTER — Telehealth: Payer: Self-pay | Admitting: Internal Medicine

## 2012-01-17 NOTE — Telephone Encounter (Signed)
Pt son requesting results of lab from recent appt

## 2012-01-17 NOTE — Telephone Encounter (Signed)
Spoke with [name] - lab good 6.3

## 2012-01-21 ENCOUNTER — Telehealth: Payer: Self-pay | Admitting: Internal Medicine

## 2012-01-21 NOTE — Telephone Encounter (Signed)
Spoke with pt son, he reports his mother has had a bad day. She complained earlier today of burning at the pacer site. She is also having dizziness and feeling lightheaded. She also is complaining of anything that touches her stomach hurts her but states that has been going on for awhile. He just wants to make sure her latitude is okay. Will discuss with the device nurses.

## 2012-01-21 NOTE — Telephone Encounter (Signed)
New msg Pt wants to talk to someone about her transmission

## 2012-01-21 NOTE — Telephone Encounter (Signed)
Latitude transmission is good and the device is working normally.  She is crying and says she can't stop.  This happens frequently and she becomes very upset and can not stop crying.  I have advised her to follow up with her PCP

## 2012-01-22 ENCOUNTER — Other Ambulatory Visit: Payer: Self-pay

## 2012-01-22 MED ORDER — ALPRAZOLAM 0.5 MG PO TABS: 0.5000 mg | ORAL_TABLET | Freq: Three times a day (TID) | ORAL | Status: DC | PRN

## 2012-01-24 ENCOUNTER — Other Ambulatory Visit: Payer: Self-pay

## 2012-01-24 MED ORDER — LORAZEPAM 0.5 MG PO TABS: 0.5000 mg | ORAL_TABLET | Freq: Two times a day (BID) | ORAL | Status: DC

## 2012-01-28 ENCOUNTER — Telehealth: Payer: Self-pay | Admitting: Internal Medicine

## 2012-01-28 DIAGNOSIS — H9190 Unspecified hearing loss, unspecified ear: Secondary | ICD-10-CM

## 2012-01-28 NOTE — Telephone Encounter (Signed)
ok 

## 2012-01-28 NOTE — Telephone Encounter (Signed)
Order done , called [facility] to let them know verbal ok

## 2012-01-28 NOTE — Telephone Encounter (Signed)
Pts son called and has schd pt for a hearing test tomorrow at 3pm at [facility]. Pt needs to get a referral from pcp in order to keep this appt date. Pls call [facility] at [phone].

## 2012-01-28 NOTE — Telephone Encounter (Signed)
Please advise 

## 2012-01-31 ENCOUNTER — Telehealth: Payer: Self-pay | Admitting: Internal Medicine

## 2012-01-31 NOTE — Telephone Encounter (Signed)
Pt called and said that she is having abdominal pain above belly button. Pt has been taking pepto-bismal and it is helping some, but pt is wondering if there is anything else she could take for it? Pt is not running a fever. Says that she does have some indegestion. Req call back from nurse.  [facility] on [address].

## 2012-01-31 NOTE — Telephone Encounter (Signed)
Spoke with pt and son - no n/v/d or fever - pain on/off - some relief with pepto - ok to give zantac - if any change to see - if worse over wkend - Saturday clinic or ER

## 2012-02-13 ENCOUNTER — Encounter (HOSPITAL_COMMUNITY): Payer: Self-pay | Admitting: *Deleted

## 2012-02-13 ENCOUNTER — Emergency Department (HOSPITAL_COMMUNITY)
Admission: EM | Admit: 2012-02-13 | Discharge: 2012-02-13 | Disposition: A | Discharge status: Nursing Home (Includes ICF) | Payer: Medicare Other | Source: Home / Self Care | Attending: Family Medicine | Admitting: Family Medicine

## 2012-02-13 ENCOUNTER — Emergency Department (HOSPITAL_COMMUNITY): Payer: Medicare Other

## 2012-02-13 ENCOUNTER — Emergency Department (HOSPITAL_COMMUNITY)
Admission: EM | Admit: 2012-02-13 | Discharge: 2012-02-13 | Disposition: A | Discharge status: Home / Self Care | Payer: Medicare Other | Attending: Emergency Medicine | Admitting: Emergency Medicine

## 2012-02-13 ENCOUNTER — Telehealth: Payer: Self-pay | Admitting: Internal Medicine

## 2012-02-13 DIAGNOSIS — R072 Precordial pain: Secondary | ICD-10-CM

## 2012-02-13 DIAGNOSIS — R109 Unspecified abdominal pain: Secondary | ICD-10-CM | POA: Insufficient documentation

## 2012-02-13 DIAGNOSIS — R748 Abnormal levels of other serum enzymes: Secondary | ICD-10-CM | POA: Insufficient documentation

## 2012-02-13 DIAGNOSIS — R079 Chest pain, unspecified: Secondary | ICD-10-CM | POA: Insufficient documentation

## 2012-02-13 DIAGNOSIS — Z79899 Other long term (current) drug therapy: Secondary | ICD-10-CM | POA: Insufficient documentation

## 2012-02-13 DIAGNOSIS — R1013 Epigastric pain: Secondary | ICD-10-CM

## 2012-02-13 DIAGNOSIS — I10 Essential (primary) hypertension: Secondary | ICD-10-CM | POA: Insufficient documentation

## 2012-02-13 DIAGNOSIS — F411 Generalized anxiety disorder: Secondary | ICD-10-CM | POA: Insufficient documentation

## 2012-02-13 LAB — URINALYSIS, ROUTINE W REFLEX MICROSCOPIC
Hgb urine dipstick: NEGATIVE
Nitrite: NEGATIVE
Protein, ur: NEGATIVE mg/dL
Urobilinogen, UA: 0.2 mg/dL (ref 0.0–1.0)

## 2012-02-13 LAB — DIFFERENTIAL
Eosinophils Relative: 0 % (ref 0–5)
Lymphocytes Relative: 29 % (ref 12–46)
Lymphs Abs: 2.7 10*3/uL (ref 0.7–4.0)
Monocytes Absolute: 0.5 10*3/uL (ref 0.1–1.0)
Monocytes Relative: 5 % (ref 3–12)
Neutro Abs: 6.1 10*3/uL (ref 1.7–7.7)

## 2012-02-13 LAB — COMPREHENSIVE METABOLIC PANEL
AST: 16 U/L (ref 0–37)
BUN: 18 mg/dL (ref 6–23)
CO2: 23 mEq/L (ref 19–32)
Chloride: 99 mEq/L (ref 96–112)
Creatinine, Ser: 0.98 mg/dL (ref 0.50–1.10)
GFR calc Af Amer: 59 mL/min — ABNORMAL LOW (ref >90)
GFR calc non Af Amer: 51 mL/min — ABNORMAL LOW (ref >90)
Glucose, Bld: 100 mg/dL — ABNORMAL HIGH (ref 70–99)
Total Bilirubin: 0.5 mg/dL (ref 0.3–1.2)

## 2012-02-13 LAB — CBC
HCT: 33.5 % — ABNORMAL LOW (ref 36.0–46.0)
Hemoglobin: 11.3 g/dL — ABNORMAL LOW (ref 12.0–15.0)
MCV: 90.5 fL (ref 78.0–100.0)
WBC: 9.4 10*3/uL (ref 4.0–10.5)

## 2012-02-13 LAB — LIPASE, BLOOD: Lipase: 83 U/L — ABNORMAL HIGH (ref 11–59)

## 2012-02-13 MED ORDER — LORAZEPAM 1 MG PO TABS
0.5000 mg | ORAL_TABLET | Freq: Once | ORAL | Status: AC
  Administered 2012-02-13: 0.5 mg via ORAL
  Filled 2012-02-13: qty 1

## 2012-02-13 MED ORDER — SODIUM CHLORIDE 0.9 % IV SOLN
1000.0000 mL | Freq: Once | INTRAVENOUS | Status: AC
  Administered 2012-02-13: 1000 mL via INTRAVENOUS

## 2012-02-13 MED ORDER — ONDANSETRON HCL 4 MG/2ML IJ SOLN
4.0000 mg | Freq: Once | INTRAMUSCULAR | Status: AC
  Administered 2012-02-13: 4 mg via INTRAVENOUS
  Filled 2012-02-13: qty 2

## 2012-02-13 MED ORDER — SODIUM CHLORIDE 0.9 % IV SOLN
INTRAVENOUS | Status: DC
  Administered 2012-02-13: 18:00:00 via INTRAVENOUS

## 2012-02-13 MED ORDER — HYDROMORPHONE HCL PF 1 MG/ML IJ SOLN
1.0000 mg | Freq: Once | INTRAMUSCULAR | Status: AC
  Administered 2012-02-13: 1 mg via INTRAVENOUS
  Filled 2012-02-13: qty 1

## 2012-02-13 MED ORDER — SODIUM CHLORIDE 0.9 % IV SOLN
1000.0000 mL | INTRAVENOUS | Status: DC
  Administered 2012-02-13: 1000 mL via INTRAVENOUS

## 2012-02-13 MED ORDER — MORPHINE SULFATE 2 MG/ML IJ SOLN
INTRAMUSCULAR | Status: AC
  Filled 2012-02-13: qty 1

## 2012-02-13 MED ORDER — MORPHINE SULFATE 2 MG/ML IJ SOLN
1.0000 mg | Freq: Once | INTRAMUSCULAR | Status: AC
  Administered 2012-02-13: 1 mg via INTRAVENOUS

## 2012-02-13 NOTE — ED Notes (Signed)
MD at bedside. 

## 2012-02-13 NOTE — Telephone Encounter (Signed)
Caller: [name]; PCP: Eleonore Chiquito; CB#: [phone]; Call regarding Nausea Unrelieved With Pepto;  Pt calling c/o abdominal pain that started at 1330 when out shopping. States it is 2 inches above umbilicus with burping. Pepto has not helped. Pt states pain is severe/unbearable. Afebrile. Disp: See ED Immediately. When told pt I might need to send her on she states "it's on all that bad" and stated she could wait until tomorrow. Spoke with Roscoe in office and relayed scenario-Made appt for 0830 in the am. When switched over to relay this to pt, over heard her breathing heavily. She denied SOB and stated it just hurts. Advised pt she should be seen tonight at [facility] (one of the options discussed with Holly)and not wait until 6/14 am. Son/Robert agrees-there with pt. Robert to take pt now to [facility]. Cancelled 0830 appt on 02/14/12.

## 2012-02-13 NOTE — ED Notes (Signed)
Pt is urgent care transfer, for epigastric/chest pain. Gave 1 mg morphine. IV established.

## 2012-02-13 NOTE — ED Notes (Signed)
Placed  On  Cardiac  Monitor   Nasal  o2  At  2  l  /  Min  

## 2012-02-13 NOTE — ED Notes (Signed)
Iv  Infiltrated  r  Arm  P[ressure  Applied  -   Restarted   22  Angio    l   Hand  1  Att

## 2012-02-13 NOTE — ED Provider Notes (Signed)
History     CSN: [id number] Arrival date & time 02/13/12  1914 First MD Initiated Contact with Patient 02/13/12 1921      Chief Complaint  Patient presents with  . Chest Pain    HPI Pt's son state that she has been complaining of pain in her abdomen for days.  The patient states she just had some pain that she thought was indigestion and that it just lasted for a little while.  Pt now states the pain is mild.  Family states she mentioned it at 2 pm and it was more severe.  No vomiting or diarrhea.  No Chest pain.  No cough or shortness of breath.  Pt is now mostly upset that she is not sure why she is here.  Past Medical History  Diagnosis Date  . ANEMIA, DEFICIENCY, HX OF 03/01/2010  . DEPRESSIVE DISORDER 09/19/2010  . DM w/o Complication Type II 05/18/2007  . GERD 05/18/2007  . HYPERLIPIDEMIA 03/17/2008  . HYPERTENSION 05/18/2007  . INSOMNIA 11/23/2008  . Loss of weight 05/30/2010  . Kidney stone   . Glaucoma   . Anxiety     Past Surgical History  Procedure Date  . Abdominal hysterectomy   . Cataract extraction   . Hemorrhoid surgery   . Appendectomy   . Pacemaker insertion 09/2011  . Dilation and curettage of uterus     Family History  Problem Relation Age of Onset  . Brain cancer Father   . Colon cancer Sister   . Colitis Brother   . Heart attack Brother     History  Substance Use Topics  . Smoking status: Never Smoker   . Smokeless tobacco: Never Used  . Alcohol Use: No    OB History    Grav Para Term Preterm Abortions TAB SAB Ect Mult Living                  Review of Systems  All other systems reviewed and are negative.    Allergies  Amoxicillin and Penicillins  Home Medications   Current Outpatient Rx  Name Route Sig Dispense Refill  . ALPRAZOLAM 0.5 MG PO TABS Oral Take 1 tablet (0.5 mg total) by mouth 3 (three) times daily as needed. For anxiety. 60 tablet 0  . ALUMINUM & MAGNESIUM HYDROXIDE 225-200 MG/5ML PO SUSP Oral Take 5 mLs by mouth  every 6 (six) hours as needed. For heartburn.    . ASPIRIN 81 MG PO TABS Oral Take 81 mg by mouth daily.      Marland Kitchen FERROUS SULFATE 325 (65 FE) MG PO TABS Oral Take 1 tablet (325 mg total) by mouth 2 (two) times daily with a meal. 30 tablet 11  . FUROSEMIDE 20 MG PO TABS  TAKE 1 TABLET ONCE A DAY TO TWICE A DAY TO CONTROL SWELLING 60 tablet 2  . LORAZEPAM 0.5 MG PO TABS Oral Take 1-2 tablets (0.5-1 mg total) by mouth 2 (two) times daily. Take 1 tablet in the morning, and 2 tablets in the evening. 90 tablet 2  . METFORMIN HCL ER 500 MG PO TB24  TAKE 1 TABLET BY MOUTH EVERY DAY 90 tablet 3  . SIMVASTATIN 80 MG PO TABS  TAKE 1/2 TABLET BY MOUTH EVERY DAY 45 tablet 2    BP 192/67  Pulse 64  Temp 98.5 F (36.9 C) (Oral)  Resp 17  SpO2 100%  Physical Exam  Nursing note and vitals reviewed. Constitutional: She appears well-developed and well-nourished.  HENT:  Head: Normocephalic and atraumatic.  Right Ear: External ear normal.  Left Ear: External ear normal.  Eyes: Conjunctivae are normal. Right eye exhibits no discharge. Left eye exhibits no discharge. No scleral icterus.  Neck: Neck supple. No tracheal deviation present.  Cardiovascular: Normal rate, regular rhythm and intact distal pulses.   Pulmonary/Chest: Effort normal and breath sounds normal. No stridor. No respiratory distress. She has no wheezes. She has no rales.  Abdominal: Soft. Bowel sounds are normal. She exhibits no distension, no abdominal bruit, no ascites and no mass. There is no tenderness. There is no rigidity, no rebound, no guarding and no CVA tenderness. No hernia.  Musculoskeletal: She exhibits no edema and no tenderness.  Neurological: She is alert. She has normal strength. No sensory deficit. Cranial nerve deficit:  no gross defecits noted. She exhibits normal muscle tone. She displays no seizure activity. Coordination normal.  Skin: Skin is warm and dry. No rash noted. She is not diaphoretic.  Psychiatric: Her mood  appears anxious.    ED Course  Procedures (including critical care time)  Rate: 60  Rhythm: normal sinus rhythm  QRS Axis: Left  Intervals: normal  ST/T Wave abnormalities: normal  Conduction Disutrbances: Right bundle branch block and left anterior fascicular block  Narrative Interpretation:   Old EKG Reviewed: none available  Labs Reviewed  CBC - Abnormal; Notable for the following:    RBC 3.70 (*)     Hemoglobin 11.3 (*)     HCT 33.5 (*)     All other components within normal limits  COMPREHENSIVE METABOLIC PANEL - Abnormal; Notable for the following:    Potassium 3.4 (*)     Glucose, Bld 100 (*)     Alkaline Phosphatase 33 (*)     GFR calc non Af Amer 51 (*)     GFR calc Af Amer 59 (*)     All other components within normal limits  URINALYSIS, ROUTINE W REFLEX MICROSCOPIC - Abnormal; Notable for the following:    pH 8.5 (*)     Ketones, ur 15 (*)     All other components within normal limits  LIPASE, BLOOD - Abnormal; Notable for the following:    Lipase 83 (*)     All other components within normal limits  DIFFERENTIAL  POCT I-STAT TROPONIN I    Dg Abd Acute W/chest  02/13/2012  *RADIOLOGY REPORT*  Clinical Data: Upper abdominal pain  ACUTE ABDOMEN SERIES (ABDOMEN 2 VIEW & CHEST 1 VIEW)  Comparison: 06/13/2011  Findings: Cardiomediastinal silhouette is stable.  Dual lead cardiac pacemaker with leads in the right atrium and right ventricle.  No acute infiltrate or pulmonary edema.  There is nonspecific nonobstructive bowel gas pattern.  No free abdominal air.  Some colonic stool noted the  IMPRESSION: .  No acute disease.  Nonspecific nonobstructive bowel gas pattern. No free abdominal air.  Original Report Authenticated By: Natasha Mead, M.D.       MDM  Patient is not complaining of any pain in her abdomen or her chest. She does have a mild elevation in her lipase. I'm not certain that this is related to her symptoms and she is not having typical symptoms associated  with pancreatitis.  She has not any trouble for appetite. She has not had any nausea or vomiting explained these results to the patient and her son. They think she should have this rechecked within the next week or so by her primary Dr. Patient is feeling better and she  is anxious to go home. At this time I do not feel there is evidence to suggest any acute emergency medical condition. She has no abdominal tenderness on exam.         Celene Kras, MD 02/13/12 2158

## 2012-02-13 NOTE — ED Notes (Signed)
"  I can't breathe, I can't breathe".  Pt with 100%sats on room air, looks frightened, extremely anxious, shaking uncontrolably, crying.  Bilat breath sounds clear.  Placed on 2 l [place] for comfort, tremors stopped with hand holding.  Oriented times three.  Will check with MD for antianxiety RX

## 2012-02-13 NOTE — ED Notes (Signed)
Pt  Reports   Epigastric    Pain     Which  Started  About 2  Pm  Today    She  Has  A  Pacemaker        Pt  Reports  Pain scale  Of  5    Skin is  Warm  Dry  No  Shortness  Of  Breath  Did  Not  Pass  Out     Alert  And  Oriented  -  Pt  Has  Has  Stomach    Problems in past

## 2012-02-13 NOTE — Discharge Instructions (Signed)

## 2012-02-14 ENCOUNTER — Ambulatory Visit: Payer: Medicare Other | Admitting: Internal Medicine

## 2012-02-15 NOTE — ED Provider Notes (Signed)
History     CSN: [id number]  Arrival date & time 02/13/12  1719   First MD Initiated Contact with Patient 02/13/12 1732      Chief Complaint  Patient presents with  . Abdominal Pain    (Consider location/radiation/quality/duration/timing/severity/associated sxs/prior treatment) HPI Comments: 78 year old female with history of diabetes, dementia, hypertension GERD and bradycardia status post cardiac pacer in January 2012. Here complaining of retrosternal and epigastric pain that started at 3:00 o'clock today (3 hours ago). Pain is constant and not alleviated by several administrations of Pepto-Bismol and other antiacid medications today. Symptoms associated with burping gas per mouth and abdominal bloatness reports nausea but denies vomiting or diarrhea. Reports last bowel movement earlier this morning describes stools as black in color but reports that she takes iron for chronic anemia. Pain rated 4/10 here.    Patient is a 78 y.o. female presenting with abdominal pain.  Abdominal Pain The primary symptoms of the illness include abdominal pain and nausea. The primary symptoms of the illness do not include fever, shortness of breath, vomiting, diarrhea or dysuria.  Symptoms associated with the illness do not include diaphoresis or frequency.    Past Medical History  Diagnosis Date  . ANEMIA, DEFICIENCY, HX OF 03/01/2010  . DEPRESSIVE DISORDER 09/19/2010  . DM w/o Complication Type II 05/18/2007  . GERD 05/18/2007  . HYPERLIPIDEMIA 03/17/2008  . HYPERTENSION 05/18/2007  . INSOMNIA 11/23/2008  . Loss of weight 05/30/2010  . Kidney stone   . Glaucoma   . Anxiety     Past Surgical History  Procedure Date  . Abdominal hysterectomy   . Cataract extraction   . Hemorrhoid surgery   . Appendectomy   . Pacemaker insertion 09/2011  . Dilation and curettage of uterus     Family History  Problem Relation Age of Onset  . Brain cancer Father   . Colon cancer Sister   . Colitis Brother     . Heart attack Brother     History  Substance Use Topics  . Smoking status: Never Smoker   . Smokeless tobacco: Never Used  . Alcohol Use: No    OB History    Grav Para Term Preterm Abortions TAB SAB Ect Mult Living                  Review of Systems  Constitutional: Positive for appetite change. Negative for fever and diaphoresis.  Respiratory: Negative for cough, shortness of breath and wheezing.   Cardiovascular: Negative for palpitations and leg swelling.       Points to retrosternal area in reference to pain.  Gastrointestinal: Positive for nausea and abdominal pain. Negative for vomiting and diarrhea.       Reports black stools from taking Iron. Denies red blood from rectum.  Genitourinary: Negative for dysuria, frequency and flank pain.  Skin: Negative for rash.  Psychiatric/Behavioral: The patient is nervous/anxious.     Allergies  Amoxicillin and Penicillins  Home Medications   Current Outpatient Rx  Name Route Sig Dispense Refill  . ACETAMINOPHEN 500 MG PO TABS Oral Take 1,000 mg by mouth every 6 (six) hours as needed. For pain    . ALPRAZOLAM 0.5 MG PO TABS Oral Take 1 tablet (0.5 mg total) by mouth 3 (three) times daily as needed. For anxiety. 60 tablet 0  . ASPIRIN 81 MG PO TABS Oral Take 81 mg by mouth daily.      Marland Kitchen BISMUTH SUBSALICYLATE 262 MG/15ML PO SUSP Oral Take 15 mLs  by mouth every 6 (six) hours as needed.    Marland Kitchen FERROUS SULFATE 325 (65 FE) MG PO TABS Oral Take 1 tablet (325 mg total) by mouth 2 (two) times daily with a meal. 30 tablet 11  . FUROSEMIDE 20 MG PO TABS  TAKE 1 TABLET ONCE A DAY TO TWICE A DAY TO CONTROL SWELLING 60 tablet 2  . LORAZEPAM 0.5 MG PO TABS Oral Take 1-2 tablets (0.5-1 mg total) by mouth 2 (two) times daily. Take 1 tablet in the morning, and 2 tablets in the evening. 90 tablet 2  . METFORMIN HCL ER 500 MG PO TB24  TAKE 1 TABLET BY MOUTH EVERY DAY 90 tablet 3  . ADULT MULTIVITAMIN W/MINERALS CH Oral Take 1 tablet by mouth daily.     . ICAPS AREDS FORMULA PO Oral Take by mouth.    Marland Kitchen SIMVASTATIN 80 MG PO TABS  TAKE 1/2 TABLET BY MOUTH EVERY DAY 45 tablet 2    BP 168/59  Pulse 66  Temp 98.1 F (36.7 C) (Oral)  Resp 20  SpO2 98%  Physical Exam  Constitutional: She is oriented to person, place, and time. She appears well-developed and well-nourished.       Anxious, hyperventilating at times.  HENT:  Head: Normocephalic and atraumatic.  Nose: Nose normal.  Mouth/Throat: Oropharynx is clear and moist. No oropharyngeal exudate.  Eyes: Conjunctivae are normal. No scleral icterus.  Neck: Neck supple. No JVD present. No thyromegaly present.  Cardiovascular: Normal rate, regular rhythm and normal heart sounds.  Exam reveals no gallop and no friction rub.   Pulmonary/Chest: Breath sounds normal. No respiratory distress. She has no wheezes. She has no rales. She exhibits no tenderness.  Abdominal: Soft. Bowel sounds are normal. She exhibits no mass. There is no rebound and no guarding.       Epigastric tenderness.  Lymphadenopathy:    She has no cervical adenopathy.  Neurological: She is alert and oriented to person, place, and time.  Skin: No rash noted.    ED Course  Procedures (including critical care time)    1. Epigastric pain   2. Retrosternal pain       MDM  78 y/o female here complaining or persistent epigastric and retrosternal pain that started at 3 pm. On exam Patient is anxious and afraid, with some hyperventilation. Abdomen is soft, with epigastric tenderness. No rebound no guarding and no distention. No masses or palpable aneurysm. EKG:  With atrial pacing at 60 beats per minute she has an old right bundle branch block, there is flat or inverted T waves in the anterior leads but no significant changes compare with prior EKG in jannuary before placement of her atrial pacer. Vital signs are stable. Decided to transfer to the emergency department for further evaluation and management. Patient had IV  placed and 1 mg of IV morphine was administered prior to discharge no pending tests.        Sharin Grave, MD 02/15/12 1340

## 2012-02-17 ENCOUNTER — Encounter: Payer: Self-pay | Admitting: Internal Medicine

## 2012-02-17 ENCOUNTER — Ambulatory Visit (INDEPENDENT_AMBULATORY_CARE_PROVIDER_SITE_OTHER): Payer: Medicare Other | Admitting: Internal Medicine

## 2012-02-17 VITALS — BP 110/68 | Temp 98.4°F | Wt 140.0 lb

## 2012-02-17 DIAGNOSIS — I10 Essential (primary) hypertension: Secondary | ICD-10-CM

## 2012-02-17 DIAGNOSIS — E119 Type 2 diabetes mellitus without complications: Secondary | ICD-10-CM

## 2012-02-17 DIAGNOSIS — E785 Hyperlipidemia, unspecified: Secondary | ICD-10-CM

## 2012-02-17 DIAGNOSIS — K219 Gastro-esophageal reflux disease without esophagitis: Secondary | ICD-10-CM

## 2012-02-17 DIAGNOSIS — R1013 Epigastric pain: Secondary | ICD-10-CM

## 2012-02-17 NOTE — Patient Instructions (Signed)
Avoids foods high in acid such as tomatoes citrus juices, and spicy foods.  Avoid eating within two hours of lying down or before exercising.  Do not overheat.  Try smaller more frequent meals.  If symptoms persist, elevate the head of her bed 12 inches while sleeping.  Limit your sodium (Salt) intake   Please check your hemoglobin A1c every 3 months

## 2012-02-17 NOTE — Progress Notes (Signed)
  Subjective:    Patient ID: [name], female    DOB: May 29, 1923, 78 y.o.   MRN: [id number]  HPI  78 year old patient who is seen today following an ED visit 4 days ago. She presented with abdominal pain. She recalls very little details of her symptoms last week but today she feels quite well. ED records are reviewed lipase was minimally elevated but otherwise lab studies were normal she has no GI complaints today she has been afebrile her appetite is good no nausea vomiting or change in her bowel habits. She does have a history of type 2 diabetes which has been stable as well as dyslipidemia and hypertension. She also has a history of gastroesophageal reflux disease. She has a history of anxiety disorder. She is accompanied by her side   Review of Systems  Constitutional: Negative.   HENT: Negative for hearing loss, congestion, sore throat, rhinorrhea, dental problem, sinus pressure and tinnitus.   Eyes: Negative for pain, discharge and visual disturbance.  Respiratory: Negative for cough and shortness of breath.   Cardiovascular: Negative for chest pain, palpitations and leg swelling.  Gastrointestinal: Positive for abdominal pain (resolved). Negative for nausea, vomiting, diarrhea, constipation, blood in stool and abdominal distention.  Genitourinary: Negative for dysuria, urgency, frequency, hematuria, flank pain, vaginal bleeding, vaginal discharge, difficulty urinating, vaginal pain and pelvic pain.  Musculoskeletal: Negative for joint swelling, arthralgias and gait problem.  Skin: Negative for rash.  Neurological: Negative for dizziness, syncope, speech difficulty, weakness, numbness and headaches.  Hematological: Negative for adenopathy.  Psychiatric/Behavioral: Positive for confusion. Negative for behavioral problems, dysphoric mood and agitation. The patient is nervous/anxious.        Objective:   Physical Exam  Constitutional: She is oriented to person, place, and time. She  appears well-developed and well-nourished.  HENT:  Head: Normocephalic.  Right Ear: External ear normal.  Left Ear: External ear normal.  Mouth/Throat: Oropharynx is clear and moist.  Eyes: Conjunctivae and EOM are normal. Pupils are equal, round, and reactive to light.  Neck: Normal range of motion. Neck supple. No thyromegaly present.  Cardiovascular: Normal rate, regular rhythm, normal heart sounds and intact distal pulses.   Pulmonary/Chest: Effort normal and breath sounds normal.  Abdominal: Soft. Bowel sounds are normal. She exhibits no distension and no mass. There is no tenderness. There is no rebound and no guarding.  Musculoskeletal: Normal range of motion.  Lymphadenopathy:    She has no cervical adenopathy.  Neurological: She is alert and oriented to person, place, and time.  Skin: Skin is warm and dry. No rash noted.  Psychiatric: She has a normal mood and affect. Her behavior is normal.          Assessment & Plan:   Abdominal pain resolved Hypertension well controlled Dyslipidemia Diabetes mellitus stable. We'll recheck in 3 months Gastroesophageal reflux disease. Stable we'll continue present regimen.

## 2012-03-04 ENCOUNTER — Ambulatory Visit (INDEPENDENT_AMBULATORY_CARE_PROVIDER_SITE_OTHER): Payer: Medicare Other | Admitting: Internal Medicine

## 2012-03-04 ENCOUNTER — Encounter: Payer: Self-pay | Admitting: Internal Medicine

## 2012-03-04 ENCOUNTER — Telehealth: Payer: Self-pay | Admitting: Family Medicine

## 2012-03-04 VITALS — BP 140/70 | Temp 98.5°F | Wt 140.0 lb

## 2012-03-04 DIAGNOSIS — R51 Headache: Secondary | ICD-10-CM

## 2012-03-04 DIAGNOSIS — E119 Type 2 diabetes mellitus without complications: Secondary | ICD-10-CM

## 2012-03-04 DIAGNOSIS — I10 Essential (primary) hypertension: Secondary | ICD-10-CM

## 2012-03-04 DIAGNOSIS — I4891 Unspecified atrial fibrillation: Secondary | ICD-10-CM
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
## 2012-03-04 NOTE — Telephone Encounter (Signed)
[facility] Triage Call Report Triage Record Num: [id number] Operator: Albertine Grates Patient Name: [name] Call Date & Time: 03/03/2012 5:07:21PM Patient Phone: [phone] PCP: Gordy Savers Patient Gender: Female PCP Fax : [phone] Patient DOB: 05-14-1923 [facility] Name: [facility] Reason for Call: Caller: [name]/Other; PCP: Eleonore Chiquito; CB#: [phone]; Call regarding Breathing problems; Pt. has complained to son "for a good while" that is having difficulty breathing. Can see no indication that is having problems breathing. Has checked bp and is 117/72. Pt. is able to talk to nurse and states has pain "between boobs" since 7-2. Pain is intermittent. Has had pain for "5-10 minutes" when speaking to pt. Son cannot find "anything different" in pt's disposition. Takes Ativan and Xanax but only gives Xanax when very anxious. Son feels like anxiety is causing her symptoms. Has been able to do usual activities 7-2. Was seen in hospital 6-14 for same symptoms and nothing found. Also states pain is relieved when burps. Son confused as to when to give anxiety medicine as states was told by 1 doctor not to give but told by another doctor to give(Xanax). Son states "needs correct information" on medicines as to when to give and when not to give. He cannot tell she is in distress. Pain has stopped while speaking to son. Advised appointment 7-3 and advised to call office to schedule. Protocol(s) Used: Chest Pain Recommended Outcome per Protocol: See Provider within 24 hours Reason for Outcome: One or more occurrences of unexplained pain in shoulders, neck, jaw, in one or both arms, stomach or back lasting more than a few minutes that has not been evaluated by a healthcare provider and has risk factors for cardiovascular disease. Care Advice: ~ Call provider if symptoms worsen or new symptoms develop. CALL EMS 911 if chest pain/discomfort lasts five minutes or  more; may or may not also have pain/discomfort in one or both arms, the back, neck, jaw or stomach and/or shortness of breath, sweating, nausea or lightheadedness. ~

## 2012-03-04 NOTE — Patient Instructions (Addendum)
Take 650-100 0 mg of Tylenol every 6 hours as needed for pain relief or fever.  Avoid taking more than 3000 mg in a 24-hour period (  This may cause liver damage).  Call or return to clinic prn if these symptoms worsen or fail to improve as anticipated.  

## 2012-03-06 ENCOUNTER — Other Ambulatory Visit: Payer: Self-pay

## 2012-03-06 MED ORDER — ALPRAZOLAM 0.5 MG PO TABS: 0.5000 mg | ORAL_TABLET | Freq: Three times a day (TID) | ORAL | Status: DC | PRN

## 2012-04-07 ENCOUNTER — Telehealth: Payer: Self-pay | Admitting: Internal Medicine

## 2012-04-07 NOTE — Telephone Encounter (Signed)
Caller: [name]/Patient; PCP: Eleonore Chiquito; CB#: [phone]; ; ; Call regarding Leg Pain; onset about 03-24-12 of bilateral leg pain with no swelling, no known injury.  states when she goes to bed at night pain is gone.   Alll emergent symptoms per Leg Non Injury ruled out except for new onset mild to moderate pain that has not improved with 24 hrs of home care   Appointment made for 04-08-12 at 1145 with Dr Caryl Never

## 2012-04-08 ENCOUNTER — Encounter: Payer: Self-pay | Admitting: Family Medicine

## 2012-04-08 ENCOUNTER — Ambulatory Visit (INDEPENDENT_AMBULATORY_CARE_PROVIDER_SITE_OTHER): Payer: Medicare Other | Admitting: Family Medicine

## 2012-04-08 VITALS — BP 142/72 | Temp 98.8°F | Wt 143.0 lb

## 2012-04-08 DIAGNOSIS — E119 Type 2 diabetes mellitus without complications: Secondary | ICD-10-CM

## 2012-04-08 DIAGNOSIS — M79609 Pain in unspecified limb: Secondary | ICD-10-CM

## 2012-04-08 DIAGNOSIS — M79605 Pain in left leg: Secondary | ICD-10-CM

## 2012-04-08 MED ORDER — GABAPENTIN 100 MG PO CAPS: ORAL_CAPSULE | ORAL | Status: DC

## 2012-04-08 NOTE — Patient Instructions (Addendum)
Consider physical therapy

## 2012-04-08 NOTE — Progress Notes (Signed)
  Subjective:    Patient ID: [name], female    DOB: 1923-05-23, 78 y.o.   MRN: [id number]  HPI  Bilateral leg pain. Increasing for at least 6-7 months. Initially noted with walking now more at rest. Patient had arterial Dopplers last year which were normal. No history of peripheral vascular disease. Does have type 2 diabetes. She describes achy type pain which is 7-8/10 severity at times. Somewhat waxes and wanes. Tylenol without much relief. Patient had normal B12 and normal thyroid last January. Diabetes been well controlled. Last A1c 6.3%. No alleviating factors. No exacerbating factors. She has some instability of gait. Does not use any assistance.  She has occasional lumbar back pain but no buttock pain.  Chronic problems are type 2 diabetes, dyslipidemia, hypertension, history of depression, history of atrial fibrillation  Past Medical History  Diagnosis Date  . ANEMIA, DEFICIENCY, HX OF 03/01/2010  . DEPRESSIVE DISORDER 09/19/2010  . DM w/o Complication Type II 05/18/2007  . GERD 05/18/2007  . HYPERLIPIDEMIA 03/17/2008  . HYPERTENSION 05/18/2007  . INSOMNIA 11/23/2008  . Loss of weight 05/30/2010  . Kidney stone   . Glaucoma   . Anxiety    Past Surgical History  Procedure Date  . Abdominal hysterectomy   . Cataract extraction   . Hemorrhoid surgery   . Appendectomy   . Pacemaker insertion 09/2011  . Dilation and curettage of uterus     reports that she has never smoked. She has never used smokeless tobacco. She reports that she does not drink alcohol or use illicit drugs. family history includes Brain cancer in her father; Colitis in her brother; Colon cancer in her sister; and Heart attack in her brother. Allergies  Allergen Reactions  . Amoxicillin     REACTION: unspecified  . Penicillins     REACTION: rash      Review of Systems  Constitutional: Negative for fever, appetite change and unexpected weight change.  Cardiovascular: Negative for chest pain and leg  swelling.  Gastrointestinal: Negative for abdominal pain.  Genitourinary: Negative for dysuria.  Musculoskeletal: Positive for gait problem. Negative for joint swelling.  Skin: Negative for rash.  Neurological: Negative for weakness.       Objective:   Physical Exam  Constitutional: She appears well-developed and well-nourished.  Cardiovascular: Normal rate and regular rhythm.   Pulmonary/Chest: Effort normal and breath sounds normal. No respiratory distress. She has no wheezes. She has no rales.  Musculoskeletal: She exhibits no edema.       No leg tenderness. No pitting edema. Patient has support hose on bilaterally. 2+ dorsalis pedis pulses bilaterally  Neurological: She is alert.       No focal weakness lower extremities.          Assessment & Plan:  Bilateral leg pain. No evidence for claudication. No significant edema. May have some lumbar stenosis but doubt this explains her lower leg pain. Question neuropathic though diabetes been well controlled. Recent B12 and thyroid function normal. Discussed possible physical therapy. Trial of gabapentin 100 mg nightly for 3 nights then titrate 100 mg twice a day. Followup with primary in one month

## 2012-04-16 ENCOUNTER — Ambulatory Visit: Payer: Medicare Other | Admitting: Internal Medicine

## 2012-05-14 ENCOUNTER — Other Ambulatory Visit: Payer: Self-pay

## 2012-05-14 MED ORDER — LORAZEPAM 0.5 MG PO TABS: 0.5000 mg | ORAL_TABLET | Freq: Two times a day (BID) | ORAL | Status: DC

## 2012-05-19 ENCOUNTER — Encounter: Payer: Self-pay | Admitting: Internal Medicine

## 2012-05-19 ENCOUNTER — Ambulatory Visit (INDEPENDENT_AMBULATORY_CARE_PROVIDER_SITE_OTHER): Payer: Medicare Other | Admitting: Internal Medicine

## 2012-05-19 VITALS — BP 132/70 | Temp 98.2°F | Wt 146.0 lb

## 2012-05-19 DIAGNOSIS — E119 Type 2 diabetes mellitus without complications: Secondary | ICD-10-CM

## 2012-05-19 DIAGNOSIS — Z23 Encounter for immunization: Secondary | ICD-10-CM

## 2012-05-19 DIAGNOSIS — M79606 Pain in leg, unspecified: Secondary | ICD-10-CM

## 2012-05-19 DIAGNOSIS — M79609 Pain in unspecified limb: Secondary | ICD-10-CM

## 2012-05-19 DIAGNOSIS — I10 Essential (primary) hypertension: Secondary | ICD-10-CM

## 2012-05-19 MED ORDER — TRAMADOL HCL 50 MG PO TABS: 50.0000 mg | ORAL_TABLET | Freq: Three times a day (TID) | ORAL | Status: DC | PRN

## 2012-05-19 MED ORDER — TRIMETHOPRIM 100 MG PO TABS: 100.0000 mg | ORAL_TABLET | Freq: Two times a day (BID) | ORAL | Status: DC

## 2012-05-19 NOTE — Addendum Note (Signed)
Addended by: Eleonore Chiquito F on: 05/19/2012 04:28 PM   Modules accepted: Level of Service

## 2012-05-19 NOTE — Addendum Note (Signed)
Addended by: Eleonore Chiquito F on: 05/19/2012 04:22 PM   Modules accepted: Orders, Level of Service

## 2012-05-19 NOTE — Progress Notes (Signed)
Subjective:    Patient ID: [name], female    DOB: 1922/11/25, 78 y.o.   MRN: [id number]  HPI  78 year old patient who is seen today in followup. She was seen one month ago complaining primarily leg pain this is aggravated by walking and involve primarily the knee to ankle region bilaterally denied any rest pain. She does have a history of well-controlled diabetes and she was given a trial of Neurontin without benefit. She does take Tylenol with variable benefit. She also complains of occasional headaches. She has a history of anxiety/panic disorder and is accompanied by her son she has done quite well in this respect. She has atrial fibrillation treated hypertension and a history of insomnia. She does use a alprazolam when necessary. In general she is doing fairly well  Past Medical History  Diagnosis Date  . ANEMIA, DEFICIENCY, HX OF 03/01/2010  . DEPRESSIVE DISORDER 09/19/2010  . DM w/o Complication Type II 05/18/2007  . GERD 05/18/2007  . HYPERLIPIDEMIA 03/17/2008  . HYPERTENSION 05/18/2007  . INSOMNIA 11/23/2008  . Loss of weight 05/30/2010  . Kidney stone   . Glaucoma   . Anxiety     History   Social History  . Marital Status: Widowed    Spouse Name: N/A    Number of Children: 3  . Years of Education: N/A   Occupational History  . retired    Social History Main Topics  . Smoking status: Never Smoker   . Smokeless tobacco: Never Used  . Alcohol Use: No  . Drug Use: No  . Sexually Active: Not on file   Other Topics Concern  . Not on file   Social History Narrative  . No narrative on file    Past Surgical History  Procedure Date  . Abdominal hysterectomy   . Cataract extraction   . Hemorrhoid surgery   . Appendectomy   . Pacemaker insertion 09/2011  . Dilation and curettage of uterus     Family History  Problem Relation Age of Onset  . Brain cancer Father   . Colon cancer Sister   . Colitis Brother   . Heart attack Brother     Allergies  Allergen  Reactions  . Amoxicillin     REACTION: unspecified  . Penicillins     REACTION: rash    Current Outpatient Prescriptions on File Prior to Visit  Medication Sig Dispense Refill  . acetaminophen (TYLENOL) 500 MG tablet Take 1,000 mg by mouth every 6 (six) hours as needed. For pain      . ALPRAZolam (XANAX) 0.5 MG tablet Take 1 tablet (0.5 mg total) by mouth 3 (three) times daily as needed. For anxiety.  60 tablet  2  . aspirin 81 MG tablet Take 81 mg by mouth daily.        Marland Kitchen bismuth subsalicylate (PEPTO BISMOL) 262 MG/15ML suspension Take 15 mLs by mouth every 6 (six) hours as needed.      . ferrous sulfate (FERROUSUL) 325 (65 FE) MG tablet Take 1 tablet (325 mg total) by mouth 2 (two) times daily with a meal.  30 tablet  11  . furosemide (LASIX) 20 MG tablet TAKE 1 TABLET ONCE A DAY TO TWICE A DAY TO CONTROL SWELLING  60 tablet  2  . gabapentin (NEURONTIN) 100 MG capsule One at night for 3 nights then increase to one twice daily  60 capsule  3  . LORazepam (ATIVAN) 0.5 MG tablet Take 1-2 tablets (0.5-1 mg total) by  mouth 2 (two) times daily. Take 1 tablet in the morning, and 2 tablets in the evening.  90 tablet  2  . metFORMIN (GLUCOPHAGE-XR) 500 MG 24 hr tablet TAKE 1 TABLET BY MOUTH EVERY DAY  90 tablet  3  . Multiple Vitamin (MULTIVITAMIN WITH MINERALS) TABS Take 1 tablet by mouth daily.      . Multiple Vitamins-Minerals (ICAPS AREDS FORMULA PO) Take by mouth.      . simvastatin (ZOCOR) 80 MG tablet TAKE 1/2 TABLET BY MOUTH EVERY DAY  45 tablet  2    BP 132/70  Temp 98.2 F (36.8 C) (Oral)  Wt 146 lb (66.225 kg)      Review of Systems  Constitutional: Positive for fatigue.  HENT: Negative for hearing loss, congestion, sore throat, rhinorrhea, dental problem, sinus pressure and tinnitus.   Eyes: Negative for pain, discharge and visual disturbance.  Respiratory: Negative for cough and shortness of breath.   Cardiovascular: Negative for chest pain, palpitations and leg swelling.    Gastrointestinal: Negative for nausea, vomiting, abdominal pain, diarrhea, constipation, blood in stool and abdominal distention.  Genitourinary: Negative for dysuria, urgency, frequency, hematuria, flank pain, vaginal bleeding, vaginal discharge, difficulty urinating, vaginal pain and pelvic pain.  Musculoskeletal: Positive for arthralgias and gait problem. Negative for joint swelling.  Skin: Negative for rash.  Neurological: Positive for weakness and headaches. Negative for dizziness, syncope, speech difficulty and numbness.  Hematological: Negative for adenopathy.  Psychiatric/Behavioral: Positive for behavioral problems and disturbed wake/sleep cycle. Negative for dysphoric mood and agitation. The patient is nervous/anxious.        Objective:   Physical Exam  Constitutional: She is oriented to person, place, and time. She appears well-developed and well-nourished.  HENT:  Head: Normocephalic.  Right Ear: External ear normal.  Left Ear: External ear normal.  Mouth/Throat: Oropharynx is clear and moist.  Eyes: Conjunctivae normal and EOM are normal. Pupils are equal, round, and reactive to light.  Neck: Normal range of motion. Neck supple. No thyromegaly present.  Cardiovascular: Normal rate, regular rhythm, normal heart sounds and intact distal pulses.   Pulmonary/Chest: Effort normal and breath sounds normal.  Abdominal: Soft. Bowel sounds are normal. She exhibits no mass. There is no tenderness.  Musculoskeletal: Normal range of motion.       Knees reveal no swelling tenderness or evidence of effusion  Trace ankle edema  Lymphadenopathy:    She has no cervical adenopathy.  Neurological: She is alert and oriented to person, place, and time.  Skin: Skin is warm and dry. No rash noted.  Psychiatric: She has a normal mood and affect. Her behavior is normal.          Assessment & Plan:   Headaches Leg pain Diabetes stable Hypertension well controlled Anxiety  disorder History of paroxysmal atrial fibrillation  In general she seems quite stable we'll treat symptomatically with tramadol and observe. We'll recheck in 2 months and a hemoglobin A1c checked at that time

## 2012-05-19 NOTE — Patient Instructions (Addendum)
Limit your sodium (Salt) intake    It is important that you exercise regularly, at least 20 minutes 3 to 4 times per week.  If you develop chest pain or shortness of breath seek  medical attention. 

## 2012-05-19 NOTE — Progress Notes (Signed)
Subjective:    Patient ID: [name], female    DOB: 1923-08-11, 78 y.o.   MRN: [id number]  HPI  78 year old patient who has type 2 diabetes. She presents with a two-day history of burning dysuria and frequency. She is somewhat better today. Denies any fever chills flank pain or hematuria.  Past Medical History  Diagnosis Date  . ANEMIA, DEFICIENCY, HX OF 03/01/2010  . DEPRESSIVE DISORDER 09/19/2010  . DM w/o Complication Type II 05/18/2007  . GERD 05/18/2007  . HYPERLIPIDEMIA 03/17/2008  . HYPERTENSION 05/18/2007  . INSOMNIA 11/23/2008  . Loss of weight 05/30/2010  . Kidney stone   . Glaucoma   . Anxiety     History   Social History  . Marital Status: Widowed    Spouse Name: N/A    Number of Children: 3  . Years of Education: N/A   Occupational History  . retired    Social History Main Topics  . Smoking status: Never Smoker   . Smokeless tobacco: Never Used  . Alcohol Use: No  . Drug Use: No  . Sexually Active: Not on file   Other Topics Concern  . Not on file   Social History Narrative  . No narrative on file    Past Surgical History  Procedure Date  . Abdominal hysterectomy   . Cataract extraction   . Hemorrhoid surgery   . Appendectomy   . Pacemaker insertion 09/2011  . Dilation and curettage of uterus     Family History  Problem Relation Age of Onset  . Brain cancer Father   . Colon cancer Sister   . Colitis Brother   . Heart attack Brother     Allergies  Allergen Reactions  . Amoxicillin     REACTION: unspecified  . Penicillins     REACTION: rash    Current Outpatient Prescriptions on File Prior to Visit  Medication Sig Dispense Refill  . acetaminophen (TYLENOL) 500 MG tablet Take 1,000 mg by mouth every 6 (six) hours as needed. For pain      . ALPRAZolam (XANAX) 0.5 MG tablet Take 1 tablet (0.5 mg total) by mouth 3 (three) times daily as needed. For anxiety.  60 tablet  2  . aspirin 81 MG tablet Take 81 mg by mouth daily.        Marland Kitchen  bismuth subsalicylate (PEPTO BISMOL) 262 MG/15ML suspension Take 15 mLs by mouth every 6 (six) hours as needed.      . ferrous sulfate (FERROUSUL) 325 (65 FE) MG tablet Take 1 tablet (325 mg total) by mouth 2 (two) times daily with a meal.  30 tablet  11  . furosemide (LASIX) 20 MG tablet TAKE 1 TABLET ONCE A DAY TO TWICE A DAY TO CONTROL SWELLING  60 tablet  2  . gabapentin (NEURONTIN) 100 MG capsule One at night for 3 nights then increase to one twice daily  60 capsule  3  . LORazepam (ATIVAN) 0.5 MG tablet Take 1-2 tablets (0.5-1 mg total) by mouth 2 (two) times daily. Take 1 tablet in the morning, and 2 tablets in the evening.  90 tablet  2  . metFORMIN (GLUCOPHAGE-XR) 500 MG 24 hr tablet TAKE 1 TABLET BY MOUTH EVERY DAY  90 tablet  3  . Multiple Vitamin (MULTIVITAMIN WITH MINERALS) TABS Take 1 tablet by mouth daily.      . Multiple Vitamins-Minerals (ICAPS AREDS FORMULA PO) Take by mouth.      . simvastatin (ZOCOR) 80 MG  tablet TAKE 1/2 TABLET BY MOUTH EVERY DAY  45 tablet  2    BP 132/70  Temp 98.2 F (36.8 C) (Oral)  Wt 146 lb (66.225 kg)       Review of Systems  Constitutional: Negative.  Negative for fever and chills.  HENT: Negative for hearing loss, congestion, sore throat, rhinorrhea, dental problem, sinus pressure and tinnitus.   Eyes: Negative for pain, discharge and visual disturbance.  Respiratory: Negative for cough and shortness of breath.   Cardiovascular: Negative for chest pain, palpitations and leg swelling.  Gastrointestinal: Negative for nausea, vomiting, abdominal pain, diarrhea, constipation, blood in stool and abdominal distention.  Genitourinary: Positive for dysuria and frequency. Negative for urgency, hematuria, flank pain, vaginal bleeding, vaginal discharge, difficulty urinating, vaginal pain and pelvic pain.  Musculoskeletal: Negative for joint swelling, arthralgias and gait problem.  Skin: Negative for rash.  Neurological: Negative for dizziness,  syncope, speech difficulty, weakness, numbness and headaches.  Hematological: Negative for adenopathy.  Psychiatric/Behavioral: Negative for behavioral problems, dysphoric mood and agitation. The patient is not nervous/anxious.        Objective:   Physical Exam  Constitutional: She appears well-developed and well-nourished. No distress.       Blood pressure well controlled at 120/80  Afebrile          Assessment & Plan:   Acute UTI. Will treat with Trimpex 100 mg twice a day for 7 days. Recheck 2 months for followup of her diabetes and  general health

## 2012-05-25 ENCOUNTER — Telehealth: Payer: Self-pay | Admitting: Internal Medicine

## 2012-05-25 NOTE — Telephone Encounter (Signed)
I have tried to reassure him that we will see his mother in January and they can call us if needed before then

## 2012-05-25 NOTE — Telephone Encounter (Signed)
New problem:  C/o pain around pacer site . Pain in the center of stomach

## 2012-05-25 NOTE — Telephone Encounter (Signed)
Spoke with son she been on antibiotic for UTI and now c/o stomach pain. She also has panic disorder and is worried that she is going to die.

## 2012-05-26 ENCOUNTER — Emergency Department (HOSPITAL_COMMUNITY)
Admission: EM | Admit: 2012-05-26 | Discharge: 2012-05-27 | Disposition: A | Discharge status: Home / Self Care | Payer: Medicare Other | Attending: Emergency Medicine | Admitting: Emergency Medicine

## 2012-05-26 DIAGNOSIS — I10 Essential (primary) hypertension: Secondary | ICD-10-CM | POA: Insufficient documentation

## 2012-05-26 DIAGNOSIS — R109 Unspecified abdominal pain: Secondary | ICD-10-CM

## 2012-05-26 DIAGNOSIS — Z7982 Long term (current) use of aspirin: Secondary | ICD-10-CM | POA: Insufficient documentation

## 2012-05-26 DIAGNOSIS — Z79899 Other long term (current) drug therapy: Secondary | ICD-10-CM | POA: Insufficient documentation

## 2012-05-26 DIAGNOSIS — K449 Diaphragmatic hernia without obstruction or gangrene: Secondary | ICD-10-CM | POA: Insufficient documentation

## 2012-05-26 DIAGNOSIS — E119 Type 2 diabetes mellitus without complications: Secondary | ICD-10-CM | POA: Insufficient documentation

## 2012-05-26 DIAGNOSIS — Z9089 Acquired absence of other organs: Secondary | ICD-10-CM | POA: Insufficient documentation

## 2012-05-26 DIAGNOSIS — N390 Urinary tract infection, site not specified: Secondary | ICD-10-CM | POA: Insufficient documentation

## 2012-05-26 LAB — URINALYSIS, ROUTINE W REFLEX MICROSCOPIC
Glucose, UA: NEGATIVE mg/dL
Leukocytes, UA: NEGATIVE
Nitrite: NEGATIVE
Specific Gravity, Urine: 1.026 (ref 1.005–1.030)
pH: 8 (ref 5.0–8.0)

## 2012-05-26 NOTE — ED Notes (Signed)
Pt c/o epigastric pain with nausea. Pt was being treated for uti.pt denies pain.

## 2012-05-27 ENCOUNTER — Emergency Department (HOSPITAL_COMMUNITY): Payer: Medicare Other

## 2012-05-27 LAB — POCT I-STAT, CHEM 8
BUN: 29 mg/dL — ABNORMAL HIGH (ref 6–23)
Calcium, Ion: 1.16 mmol/L (ref 1.13–1.30)
Chloride: 102 mEq/L (ref 96–112)
Creatinine, Ser: 1.6 mg/dL — ABNORMAL HIGH (ref 0.50–1.10)
Glucose, Bld: 108 mg/dL — ABNORMAL HIGH (ref 70–99)
TCO2: 27 mmol/L (ref 0–100)

## 2012-05-27 LAB — CBC WITH DIFFERENTIAL/PLATELET
Basophils Relative: 0 % (ref 0–1)
Eosinophils Relative: 0 % (ref 0–5)
Lymphs Abs: 2.1 10*3/uL (ref 0.7–4.0)
MCH: 29.1 pg (ref 26.0–34.0)
MCHC: 33.2 g/dL (ref 30.0–36.0)
MCV: 87.6 fL (ref 78.0–100.0)
Monocytes Absolute: 0.6 10*3/uL (ref 0.1–1.0)
Platelets: 343 10*3/uL (ref 150–400)
RBC: 3.78 MIL/uL — ABNORMAL LOW (ref 3.87–5.11)

## 2012-05-27 LAB — COMPREHENSIVE METABOLIC PANEL
CO2: 25 mEq/L (ref 19–32)
Calcium: 9.9 mg/dL (ref 8.4–10.5)
Creatinine, Ser: 1.37 mg/dL — ABNORMAL HIGH (ref 0.50–1.10)
GFR calc Af Amer: 39 mL/min — ABNORMAL LOW (ref >90)
GFR calc non Af Amer: 34 mL/min — ABNORMAL LOW (ref >90)
Glucose, Bld: 113 mg/dL — ABNORMAL HIGH (ref 70–99)

## 2012-05-27 LAB — LIPASE, BLOOD: Lipase: 37 U/L (ref 11–59)

## 2012-05-27 MED ORDER — SODIUM CHLORIDE 0.9 % IV SOLN
INTRAVENOUS | Status: DC
  Administered 2012-05-27: 02:00:00 via INTRAVENOUS

## 2012-05-27 MED ORDER — IOHEXOL 300 MG/ML  SOLN
70.0000 mL | Freq: Once | INTRAMUSCULAR | Status: AC | PRN
  Administered 2012-05-27: 70 mL via INTRAVENOUS

## 2012-05-27 MED ORDER — ONDANSETRON HCL 4 MG/2ML IJ SOLN
4.0000 mg | Freq: Once | INTRAMUSCULAR | Status: AC
  Administered 2012-05-27: 4 mg via INTRAVENOUS
  Filled 2012-05-27: qty 2

## 2012-05-27 MED ORDER — ONDANSETRON HCL 4 MG PO TABS: 4.0000 mg | ORAL_TABLET | Freq: Four times a day (QID) | ORAL | Status: DC

## 2012-05-27 MED ORDER — FAMOTIDINE 20 MG PO TABS: 20.0000 mg | ORAL_TABLET | Freq: Two times a day (BID) | ORAL | Status: DC

## 2012-05-27 NOTE — ED Notes (Signed)
Pt ambulated with minimal assistance around both nurses station without difficulty.

## 2012-05-27 NOTE — ED Notes (Signed)
Ambulated to restroom two times.

## 2012-05-27 NOTE — ED Provider Notes (Signed)
History     CSN: [id number]  Arrival date & time 05/26/12  2201   First MD Initiated Contact with Patient 05/27/12 0034      Chief Complaint  Patient presents with  . Urinary Tract Infection  . Abdominal Pain    (Consider location/radiation/quality/duration/timing/severity/associated sxs/prior treatment) HPI Hx per PT, not feeling well since this afternoon, was having some epigastric pain, and some lower ABD midline pain. Nausea no vomiting or diarrhea, felt feverish no measured temp. No CP or SOB, no back pain. PT is a very poor historian - has trouble explaining her symptoms, no AMS per husband bedside. Mod in severity.  Past Medical History  Diagnosis Date  . ANEMIA, DEFICIENCY, HX OF 03/01/2010  . DEPRESSIVE DISORDER 09/19/2010  . DM w/o Complication Type II 05/18/2007  . GERD 05/18/2007  . HYPERLIPIDEMIA 03/17/2008  . HYPERTENSION 05/18/2007  . INSOMNIA 11/23/2008  . Loss of weight 05/30/2010  . Kidney stone   . Glaucoma   . Anxiety     Past Surgical History  Procedure Date  . Abdominal hysterectomy   . Cataract extraction   . Hemorrhoid surgery   . Appendectomy   . Pacemaker insertion 09/2011  . Dilation and curettage of uterus     Family History  Problem Relation Age of Onset  . Brain cancer Father   . Colon cancer Sister   . Colitis Brother   . Heart attack Brother     History  Substance Use Topics  . Smoking status: Never Smoker   . Smokeless tobacco: Never Used  . Alcohol Use: No    OB History    Grav Para Term Preterm Abortions TAB SAB Ect Mult Living                  Review of Systems  Constitutional: Positive for fever. Negative for chills.  HENT: Negative for neck pain and neck stiffness.   Eyes: Negative for pain.  Respiratory: Negative for shortness of breath.   Cardiovascular: Negative for chest pain.  Gastrointestinal: Positive for nausea and abdominal pain.  Genitourinary: Negative for dysuria.  Musculoskeletal: Negative for back pain.   Skin: Negative for rash.  Neurological: Negative for headaches.  All other systems reviewed and are negative.    Allergies  Amoxicillin and Penicillins  Home Medications   Current Outpatient Rx  Name Route Sig Dispense Refill  . ACETAMINOPHEN 500 MG PO TABS Oral Take 1,000 mg by mouth every 6 (six) hours as needed. For pain    . ALPRAZOLAM 0.5 MG PO TABS      . ASPIRIN 81 MG PO TABS Oral Take 81 mg by mouth daily.      Marland Kitchen BISMUTH SUBSALICYLATE 262 MG/15ML PO SUSP Oral Take 15 mLs by mouth every 6 (six) hours as needed.    Marland Kitchen FERROUS SULFATE 325 (65 FE) MG PO TABS Oral Take 1 tablet (325 mg total) by mouth 2 (two) times daily with a meal. 30 tablet 11  . FUROSEMIDE 20 MG PO TABS  TAKE 1 TABLET ONCE A DAY TO TWICE A DAY TO CONTROL SWELLING 60 tablet 2  . GABAPENTIN 100 MG PO CAPS      . LORAZEPAM 0.5 MG PO TABS Oral Take 1-2 tablets (0.5-1 mg total) by mouth 2 (two) times daily. Take 1 tablet in the morning, and 2 tablets in the evening. 90 tablet 2  . METFORMIN HCL ER 500 MG PO TB24  TAKE 1 TABLET BY MOUTH EVERY DAY 90 tablet  3  . ADULT MULTIVITAMIN W/MINERALS CH Oral Take 1 tablet by mouth daily.    . ICAPS AREDS FORMULA PO Oral Take by mouth.    Marland Kitchen SIMVASTATIN 80 MG PO TABS  TAKE 1/2 TABLET BY MOUTH EVERY DAY 45 tablet 2  . TRAMADOL HCL 50 MG PO TABS Oral Take 1 tablet (50 mg total) by mouth every 8 (eight) hours as needed for pain. 60 tablet 4    BP 140/91  Pulse 65  Temp 98.9 F (37.2 C) (Oral)  Resp 20  SpO2 99%  Physical Exam  Constitutional: She is oriented to person, place, and time. She appears well-developed and well-nourished.  HENT:  Head: Normocephalic and atraumatic.  Eyes: Conjunctivae normal and EOM are normal. Pupils are equal, round, and reactive to light.  Neck: Trachea normal. Neck supple. No thyromegaly present.  Cardiovascular: Normal rate, regular rhythm, S1 normal, S2 normal and normal pulses.     No systolic murmur is present   No diastolic murmur is  present  Pulses:      Radial pulses are 2+ on the right side, and 2+ on the left side.  Pulmonary/Chest: Effort normal and breath sounds normal. She has no wheezes. She has no rhonchi. She has no rales. She exhibits no tenderness.  Abdominal: Soft. Normal appearance and bowel sounds are normal. She exhibits no mass. There is no rebound, no CVA tenderness and negative Murphy's sign.       Mild diffuse tenderness with vol guarding  Musculoskeletal:       BLE:s Calves nontender, no cords or erythema, negative Homans sign  Neurological: She is alert and oriented to person, place, and time. She has normal strength. No cranial nerve deficit or sensory deficit. GCS eye subscore is 4. GCS verbal subscore is 5. GCS motor subscore is 6.  Skin: Skin is warm and dry. No rash noted. She is not diaphoretic.  Psychiatric: Her speech is normal.       Cooperative and appropriate    ED Course  Procedures (including critical care time)  Results for orders placed during the hospital encounter of 05/26/12  CBC WITH DIFFERENTIAL      Component Value Range   WBC 11.4 (*) 4.0 - 10.5 K/uL   RBC 3.78 (*) 3.87 - 5.11 MIL/uL   Hemoglobin 11.0 (*) 12.0 - 15.0 g/dL   HCT 16.1 (*) 09.6 - 04.5 %   MCV 87.6  78.0 - 100.0 fL   MCH 29.1  26.0 - 34.0 pg   MCHC 33.2  30.0 - 36.0 g/dL   RDW 40.9  81.1 - 91.4 %   Platelets 343  150 - 400 K/uL   Neutrophils Relative 77  43 - 77 %   Lymphocytes Relative 18  12 - 46 %   Monocytes Relative 5  3 - 12 %   Eosinophils Relative 0  0 - 5 %   Basophils Relative 0  0 - 1 %   Neutro Abs 8.7 (*) 1.7 - 7.7 K/uL   Lymphs Abs 2.1  0.7 - 4.0 K/uL   Monocytes Absolute 0.6  0.1 - 1.0 K/uL   Eosinophils Absolute 0.0  0.0 - 0.7 K/uL   Basophils Absolute 0.0  0.0 - 0.1 K/uL   RBC Morphology ELLIPTOCYTES    COMPREHENSIVE METABOLIC PANEL      Component Value Range   Sodium 134 (*) 135 - 145 mEq/L   Potassium 4.7  3.5 - 5.1 mEq/L   Chloride 95 (*) 96 -  112 mEq/L   CO2 25  19 - 32  mEq/L   Glucose, Bld 113 (*) 70 - 99 mg/dL   BUN 26 (*) 6 - 23 mg/dL   Creatinine, Ser 1.61 (*) 0.50 - 1.10 mg/dL   Calcium 9.9  8.4 - 09.6 mg/dL   Total Protein 7.9  6.0 - 8.3 g/dL   Albumin 4.1  3.5 - 5.2 g/dL   AST 27  0 - 37 U/L   ALT 7  0 - 35 U/L   Alkaline Phosphatase 39  39 - 117 U/L   Total Bilirubin 0.4  0.3 - 1.2 mg/dL   GFR calc non Af Amer 34 (*) >90 mL/min   GFR calc Af Amer 39 (*) >90 mL/min  URINALYSIS, ROUTINE W REFLEX MICROSCOPIC      Component Value Range   Color, Urine YELLOW  YELLOW   APPearance CLOUDY (*) CLEAR   Specific Gravity, Urine 1.026  1.005 - 1.030   pH 8.0  5.0 - 8.0   Glucose, UA NEGATIVE  NEGATIVE mg/dL   Hgb urine dipstick NEGATIVE  NEGATIVE   Bilirubin Urine NEGATIVE  NEGATIVE   Ketones, ur 15 (*) NEGATIVE mg/dL   Protein, ur NEGATIVE  NEGATIVE mg/dL   Urobilinogen, UA 0.2  0.0 - 1.0 mg/dL   Nitrite NEGATIVE  NEGATIVE   Leukocytes, UA NEGATIVE  NEGATIVE  LIPASE, BLOOD      Component Value Range   Lipase 37  11 - 59 U/L  POCT I-STAT, CHEM 8      Component Value Range   Sodium 137  135 - 145 mEq/L   Potassium 4.3  3.5 - 5.1 mEq/L   Chloride 102  96 - 112 mEq/L   BUN 29 (*) 6 - 23 mg/dL   Creatinine, Ser 0.45 (*) 0.50 - 1.10 mg/dL   Glucose, Bld 409 (*) 70 - 99 mg/dL   Calcium, Ion 8.11  9.14 - 1.30 mmol/L   TCO2 27  0 - 100 mmol/L   Hemoglobin 11.9 (*) 12.0 - 15.0 g/dL   HCT 78.2 (*) 95.6 - 21.3 %   Ct Abdomen Pelvis W Contrast  05/27/2012  *RADIOLOGY REPORT*  Clinical Data: Epigastric pain.  Nausea.  Elevated white blood cell count.  Urinary tract infection.  CT ABDOMEN AND PELVIS WITH CONTRAST  Technique:  Multidetector CT imaging of the abdomen and pelvis was performed following the standard protocol during bolus administration of intravenous contrast.  Contrast: 70mL OMNIPAQUE IOHEXOL 300 MG/ML  SOLN  Comparison: 07/25/2010.  Findings: Lung Bases: Cardiac pacemaker.  Mild right heart enlargement.  Small hiatal hernia.  Thoracic aortic  atherosclerosis.  Lingular scarring.  Liver:  Normal.  Spleen:  Normal.  Gallbladder:  Normal.  Common bile duct:  Normal.  Pancreas:  Appropriately atrophic.  Adrenal glands:  Normal.  Kidneys:  Normal renal enhancement and excretion of contrast.  Stomach:  Normal aside from small hiatal hernia.  Small bowel:  Duodenum normal.  Small bowel appears within normal limits.  No obstruction.  No mesenteric adenopathy.  Colon:   Appendix not identified.  High position of the cecum. Ileocecal valve appears normal.  Redundant sigmoid colon with diverticulosis.  No diverticulitis.  Pelvic Genitourinary:  Hysterectomy.  Urinary bladder appears normal.  Bones:  No aggressive osseous lesions.  Lumbar spondylosis and facet arthrosis.  Vasculature: Atherosclerosis without aneurysm.  IMPRESSION: 1.  No acute abnormality. 2.  Appendectomy, hysterectomy. 3.  Small hiatal hernia.   Original Report Authenticated By: Andreas Newport,  M.D.      IVfs and zofran provided. CT scan for nonspecific ABD pain complaints in elderly female with dementia.   4:48 AM CT reviewed. on recheck is feeling better and requesting to be discharged home, Labs and Ct reviewed as above, exam is improved, ABD: s/n/t/nd, no peritonitis.   Husband bedside, very much prefers for her to be discharge home in inmproved condition. Plan zofran, pepcid and close PCP follow up. ABD pain precautions provided and husband stated as understood.   MDM    ABD pain in elderly female with dementia. Work up with UA, labs and CT scan as above. IVfs and zofran provided. Exam improved and CT non-revealing for source of complaints. Previous hysto and appy by review of records. No SBO. Neg Murphys sign and improved without pain medications doubt acute surgical process.        Sunnie Nielsen, MD 05/28/12 [phone]

## 2012-05-28 ENCOUNTER — Ambulatory Visit (INDEPENDENT_AMBULATORY_CARE_PROVIDER_SITE_OTHER): Payer: Medicare Other | Admitting: Internal Medicine

## 2012-05-28 ENCOUNTER — Encounter: Payer: Self-pay | Admitting: Internal Medicine

## 2012-05-28 VITALS — BP 118/70 | Temp 98.2°F | Wt 142.0 lb

## 2012-05-28 DIAGNOSIS — R1013 Epigastric pain: Secondary | ICD-10-CM

## 2012-05-28 DIAGNOSIS — I10 Essential (primary) hypertension: Secondary | ICD-10-CM

## 2012-05-28 DIAGNOSIS — E785 Hyperlipidemia, unspecified: Secondary | ICD-10-CM

## 2012-05-28 LAB — URINE CULTURE: Colony Count: NO GROWTH

## 2012-05-28 NOTE — Progress Notes (Signed)
Subjective:    Patient ID: [name], female    DOB: Jul 21, 1923, 78 y.o.   MRN: [id number]  HPI  78 year old patient who is seen today post ED evaluation. She presented with epigastric pain that has not reoccurred since her ED discharge 2 days ago she has had some associated nausea and some radiation of pain to the back area and abdominal CT scan was performed;  she was placed on Pepcid and an anti-reflex regimen. Medical issues include hypertension dyslipidemia and diabetes which has been treated with metformin.  Past Medical History  Diagnosis Date  . ANEMIA, DEFICIENCY, HX OF 03/01/2010  . DEPRESSIVE DISORDER 09/19/2010  . DM w/o Complication Type II 05/18/2007  . GERD 05/18/2007  . HYPERLIPIDEMIA 03/17/2008  . HYPERTENSION 05/18/2007  . INSOMNIA 11/23/2008  . Loss of weight 05/30/2010  . Kidney stone   . Glaucoma   . Anxiety     History   Social History  . Marital Status: Widowed    Spouse Name: N/A    Number of Children: 3  . Years of Education: N/A   Occupational History  . retired    Social History Main Topics  . Smoking status: Never Smoker   . Smokeless tobacco: Never Used  . Alcohol Use: No  . Drug Use: No  . Sexually Active: Not on file   Other Topics Concern  . Not on file   Social History Narrative  . No narrative on file    Past Surgical History  Procedure Date  . Abdominal hysterectomy   . Cataract extraction   . Hemorrhoid surgery   . Appendectomy   . Pacemaker insertion 09/2011  . Dilation and curettage of uterus     Family History  Problem Relation Age of Onset  . Brain cancer Father   . Colon cancer Sister   . Colitis Brother   . Heart attack Brother     Allergies  Allergen Reactions  . Amoxicillin     REACTION: unspecified  . Penicillins     REACTION: rash    Current Outpatient Prescriptions on File Prior to Visit  Medication Sig Dispense Refill  . acetaminophen (TYLENOL) 500 MG tablet Take 1,000 mg by mouth every 6 (six)  hours as needed. For pain      . ALPRAZolam (XANAX) 0.5 MG tablet Take 0.5 mg by mouth 3 (three) times daily as needed. Agitation      . aspirin 81 MG tablet Take 81 mg by mouth daily.        Marland Kitchen bismuth subsalicylate (PEPTO BISMOL) 262 MG/15ML suspension Take 15 mLs by mouth every 6 (six) hours as needed. Stomach      . famotidine (PEPCID) 20 MG tablet Take 1 tablet (20 mg total) by mouth 2 (two) times daily.  30 tablet  0  . ferrous sulfate (FERROUSUL) 325 (65 FE) MG tablet Take 1 tablet (325 mg total) by mouth 2 (two) times daily with a meal.  30 tablet  11  . furosemide (LASIX) 20 MG tablet TAKE 1 TABLET ONCE A DAY TO TWICE A DAY TO CONTROL SWELLING  60 tablet  2  . LORazepam (ATIVAN) 0.5 MG tablet Take 0.5-1 mg by mouth 2 (two) times daily. Take 1 tablet in the morning, and 2 tablets in the evening.      . metFORMIN (GLUCOPHAGE-XR) 500 MG 24 hr tablet TAKE 1 TABLET BY MOUTH EVERY DAY  90 tablet  3  . Multiple Vitamin (MULTIVITAMIN WITH MINERALS) TABS Take  1 tablet by mouth daily.      . Multiple Vitamins-Minerals (ICAPS AREDS FORMULA PO) Take by mouth.      . ondansetron (ZOFRAN) 4 MG tablet Take 1 tablet (4 mg total) by mouth every 6 (six) hours.  12 tablet  0  . simvastatin (ZOCOR) 80 MG tablet TAKE 1/2 TABLET BY MOUTH EVERY DAY  45 tablet  2  . traMADol (ULTRAM) 50 MG tablet Take 1 tablet (50 mg total) by mouth every 8 (eight) hours as needed for pain.  60 tablet  4    BP 118/70  Temp 98.2 F (36.8 C) (Oral)  Wt 142 lb (64.411 kg)    ED records reviewed Abdominal CT scan reviewed    Review of Systems  Constitutional: Negative.   HENT: Negative for hearing loss, congestion, sore throat, rhinorrhea, dental problem, sinus pressure and tinnitus.   Eyes: Negative for pain, discharge and visual disturbance.  Respiratory: Negative for cough and shortness of breath.   Cardiovascular: Negative for chest pain, palpitations and leg swelling.  Gastrointestinal: Positive for abdominal  pain. Negative for nausea, vomiting, diarrhea, constipation, blood in stool and abdominal distention.  Genitourinary: Negative for dysuria, urgency, frequency, hematuria, flank pain, vaginal bleeding, vaginal discharge, difficulty urinating, vaginal pain and pelvic pain.  Musculoskeletal: Negative for joint swelling, arthralgias and gait problem.  Skin: Negative for rash.  Neurological: Negative for dizziness, syncope, speech difficulty, weakness, numbness and headaches.  Hematological: Negative for adenopathy.  Psychiatric/Behavioral: Negative for behavioral problems, dysphoric mood and agitation. The patient is nervous/anxious.        Objective:   Physical Exam  Constitutional: She is oriented to person, place, and time. She appears well-developed and well-nourished.  HENT:  Head: Normocephalic.  Right Ear: External ear normal.  Left Ear: External ear normal.  Mouth/Throat: Oropharynx is clear and moist.  Eyes: Conjunctivae normal and EOM are normal. Pupils are equal, round, and reactive to light.  Neck: Normal range of motion. Neck supple. No thyromegaly present.  Cardiovascular: Normal rate, regular rhythm, normal heart sounds and intact distal pulses.   Pulmonary/Chest: Effort normal and breath sounds normal. Tenderness: very.  Abdominal: Soft. Bowel sounds are normal. She exhibits no mass. There is tenderness.       Very  mild epigastric tenderness  Musculoskeletal: Normal range of motion.  Lymphadenopathy:    She has no cervical adenopathy.  Neurological: She is alert and oriented to person, place, and time.  Skin: Skin is warm and dry. No rash noted.  Psychiatric: She has a normal mood and affect. Her behavior is normal.          Assessment & Plan:   Epigastric pain resolved. We'll continue an aggressive anti-reflex regimen and present regimen. A short course of the Zegerid dispensed Hypertension stable Diabetes stable Dyslipidemia. We'll continue statin  therapy  Recheck 3 months or as needed

## 2012-05-28 NOTE — Patient Instructions (Addendum)
Avoids foods high in acid such as tomatoes citrus juices, and spicy foods.  Avoid eating within two hours of lying down or before exercising.  Do not overheat.  Try smaller more frequent meals.  If symptoms persist, elevate the head of her bed 12 inches while sleeping.  Limit your sodium (Salt) intake   Please check your hemoglobin A1c every 3 months  Diet for Gastroesophageal Reflux Disease, Child Some children have small, brief episodes of reflux. Reflux (acid reflux) is when acid from your stomach flows up into the esophagus. When acid comes in contact with the esophagus, the acid causes irritation and soreness (inflammation) in the esophagus. The reflux may be so small that a child may not notice it. When reflux happens often or so severely that it causes damage to the esophagus, it is called gastroesophageal reflux disease (GERD). Nutrition therapy can help ease the discomfort of GERD.   FOODS TO AVOID    Caffeinated and decaffeinated coffee and black tea.   Regular or low-calorie carbonated beverages or energy drinks (caffeine-free carbonated beverages are allowed).   Strong spices, such as black pepper, white pepper, red pepper, cayenne, curry powder, and chili powder.   Peppermint or spearmint.   Chocolate.   High-fat foods, including meats and fried foods. Extra added fats including oils, butter, salad dressings, and nuts. Low-fat foods may not be recommended for children less than 79 years of age. Discuss this with your doctor or dietitian.   Fruits and vegetables that are not tolerated, such as citrus fruits and tomatoes.   Any food that seems to aggravate the child's condition.  If you have questions regarding your child's diet, call your caregiver or a registered dietician. OTHER THINGS THAT MAY HELP GERD INCLUDE:  Having the child eat his or her meals slowly, in a relaxed setting.   Serving several small meals throughout the day instead of 3 large meals.   Eliminating  food for a period of time if it causes distress.   Not letting the child lie down immediately after eating a meal.   Keeping the head of the child's bed raised 6 to 9 inches (15 to 23 cm) by using a foam wedge or blocks under the legs of the bed.   Encouraging the child to be physically active. Weight loss may be helpful in reducing reflux in overweight or obese children.   Having the child wear loose-fitting clothing.   Avoiding the use of tobacco in parents and caregivers. Secondhand smoke may aggravate symptoms in children with reflux.  SAMPLE MEAL PLAN This is a sample meal plan for a 18 to 35 year old child and is approximately 1200 calories based on https://www.[facility].org/ meal planning guidelines.   Breakfast   cup cooked oatmeal.    cup strawberries.    cup low-fat milk.  Snack   cup cucumber slices.   4 oz yogurt (made from low-fat milk).  Lunch  1 slice whole-wheat bread.   1 oz chicken.    cup blueberries.    cup snap peas.  Snack  3 whole-wheat crackers.   1 oz string cheese.  Dinner   cup brown rice.    cup mixed veggies.   1 cup low-fat milk.   2 oz grilled fish.  Document Released: 01/05/2007 Document Revised: 08/08/2011 Document Reviewed: 07/11/2011 [facility] [address] Patient Information 2012 [place], [place].

## 2012-06-12 ENCOUNTER — Encounter: Payer: Self-pay | Admitting: Internal Medicine

## 2012-06-12 ENCOUNTER — Ambulatory Visit (INDEPENDENT_AMBULATORY_CARE_PROVIDER_SITE_OTHER): Payer: Medicare Other | Admitting: Internal Medicine

## 2012-06-12 VITALS — BP 118/70 | Temp 98.0°F | Wt 143.0 lb

## 2012-06-12 DIAGNOSIS — I10 Essential (primary) hypertension: Secondary | ICD-10-CM

## 2012-06-12 DIAGNOSIS — R1013 Epigastric pain: Secondary | ICD-10-CM

## 2012-06-12 DIAGNOSIS — F329 Major depressive disorder, single episode, unspecified: Secondary | ICD-10-CM

## 2012-06-12 DIAGNOSIS — K219 Gastro-esophageal reflux disease without esophagitis: Secondary | ICD-10-CM

## 2012-06-12 NOTE — Progress Notes (Signed)
Subjective:    Patient ID: [name], female    DOB: 01/24/1923, 78 y.o.   MRN: [id number]  HPI  78 year old patient who has a history of anxiety disorder gastroesophageal reflux disease who presents today with chief complaints of headache and abdominal pain. These have been fairly chronic complaints she has a history of mild dementia and does not recall the actual reason for her visit today. She is accompanied by her son  Past Medical History  Diagnosis Date  . ANEMIA, DEFICIENCY, HX OF 03/01/2010  . DEPRESSIVE DISORDER 09/19/2010  . DM w/o Complication Type II 05/18/2007  . GERD 05/18/2007  . HYPERLIPIDEMIA 03/17/2008  . HYPERTENSION 05/18/2007  . INSOMNIA 11/23/2008  . Loss of weight 05/30/2010  . Kidney stone   . Glaucoma(365)   . Anxiety     History   Social History  . Marital Status: Widowed    Spouse Name: N/A    Number of Children: 3  . Years of Education: N/A   Occupational History  . retired    Social History Main Topics  . Smoking status: Never Smoker   . Smokeless tobacco: Never Used  . Alcohol Use: No  . Drug Use: No  . Sexually Active: Not on file   Other Topics Concern  . Not on file   Social History Narrative  . No narrative on file    Past Surgical History  Procedure Date  . Abdominal hysterectomy   . Cataract extraction   . Hemorrhoid surgery   . Appendectomy   . Pacemaker insertion 09/2011  . Dilation and curettage of uterus     Family History  Problem Relation Age of Onset  . Brain cancer Father   . Colon cancer Sister   . Colitis Brother   . Heart attack Brother     Allergies  Allergen Reactions  . Amoxicillin     REACTION: unspecified  . Penicillins     REACTION: rash    Current Outpatient Prescriptions on File Prior to Visit  Medication Sig Dispense Refill  . acetaminophen (TYLENOL) 500 MG tablet Take 1,000 mg by mouth every 6 (six) hours as needed. For pain      . ALPRAZolam (XANAX) 0.5 MG tablet Take 0.5 mg by mouth 3  (three) times daily as needed. Agitation      . aspirin 81 MG tablet Take 81 mg by mouth daily.        Marland Kitchen bismuth subsalicylate (PEPTO BISMOL) 262 MG/15ML suspension Take 15 mLs by mouth every 6 (six) hours as needed. Stomach      . famotidine (PEPCID) 20 MG tablet Take 1 tablet (20 mg total) by mouth 2 (two) times daily.  30 tablet  0  . ferrous sulfate (FERROUSUL) 325 (65 FE) MG tablet Take 1 tablet (325 mg total) by mouth 2 (two) times daily with a meal.  30 tablet  11  . furosemide (LASIX) 20 MG tablet TAKE 1 TABLET ONCE A DAY TO TWICE A DAY TO CONTROL SWELLING  60 tablet  2  . LORazepam (ATIVAN) 0.5 MG tablet Take 0.5-1 mg by mouth 2 (two) times daily. Take 1 tablet in the morning, and 2 tablets in the evening.      . metFORMIN (GLUCOPHAGE-XR) 500 MG 24 hr tablet TAKE 1 TABLET BY MOUTH EVERY DAY  90 tablet  3  . Multiple Vitamin (MULTIVITAMIN WITH MINERALS) TABS Take 1 tablet by mouth daily.      . Multiple Vitamins-Minerals (ICAPS AREDS FORMULA  PO) Take by mouth.      . simvastatin (ZOCOR) 80 MG tablet TAKE 1/2 TABLET BY MOUTH EVERY DAY  45 tablet  2  . traMADol (ULTRAM) 50 MG tablet Take 1 tablet (50 mg total) by mouth every 8 (eight) hours as needed for pain.  60 tablet  4  . ondansetron (ZOFRAN) 4 MG tablet Take 1 tablet (4 mg total) by mouth every 6 (six) hours.  12 tablet  0    BP 118/70  Temp 98 F (36.7 C) (Oral)  Wt 143 lb (64.864 kg)       Review of Systems  Constitutional: Negative.   HENT: Negative for hearing loss, congestion, sore throat, rhinorrhea, dental problem, sinus pressure and tinnitus.   Eyes: Negative for pain, discharge and visual disturbance.  Respiratory: Negative for cough and shortness of breath.   Cardiovascular: Negative for chest pain, palpitations and leg swelling.  Gastrointestinal: Positive for abdominal pain. Negative for nausea, vomiting, diarrhea, constipation, blood in stool and abdominal distention.  Genitourinary: Negative for dysuria,  urgency, frequency, hematuria, flank pain, vaginal bleeding, vaginal discharge, difficulty urinating, vaginal pain and pelvic pain.  Musculoskeletal: Negative for joint swelling, arthralgias and gait problem.  Skin: Negative for rash.  Neurological: Positive for headaches. Negative for dizziness, syncope, speech difficulty, weakness and numbness.  Hematological: Negative for adenopathy.  Psychiatric/Behavioral: Positive for confusion. Negative for behavioral problems, dysphoric mood and agitation. The patient is not nervous/anxious.        Objective:   Physical Exam  Constitutional: She is oriented to person, place, and time. She appears well-developed and well-nourished.  HENT:  Head: Normocephalic.  Right Ear: External ear normal.  Left Ear: External ear normal.  Mouth/Throat: Oropharynx is clear and moist.  Eyes: Conjunctivae normal and EOM are normal. Pupils are equal, round, and reactive to light.  Neck: Normal range of motion. Neck supple. No thyromegaly present.  Cardiovascular: Normal rate, regular rhythm, normal heart sounds and intact distal pulses.   Pulmonary/Chest: Effort normal and breath sounds normal.  Abdominal: Soft. Bowel sounds are normal. She exhibits no mass. There is no tenderness.  Musculoskeletal: Normal range of motion.  Lymphadenopathy:    She has no cervical adenopathy.  Neurological: She is alert and oriented to person, place, and time.  Skin: Skin is warm and dry. No rash noted.  Psychiatric: She has a normal mood and affect. Her behavior is normal.          Assessment & Plan:   Hypertension well controlled Dyslipidemia Anxiety depression History abdominal pain. Normal clinical exam History of headaches. Presently asymptomatic  Patient has an appointment in 2 months. We'll reassess at that time or as needed

## 2012-06-16 ENCOUNTER — Ambulatory Visit (INDEPENDENT_AMBULATORY_CARE_PROVIDER_SITE_OTHER): Payer: Medicare Other | Admitting: Family Medicine

## 2012-06-16 ENCOUNTER — Ambulatory Visit: Payer: Medicare Other

## 2012-06-16 VITALS — BP 168/70 | HR 66 | Temp 97.9°F | Resp 16 | Ht 63.0 in | Wt 138.0 lb

## 2012-06-16 DIAGNOSIS — K5732 Diverticulitis of large intestine without perforation or abscess without bleeding: Secondary | ICD-10-CM

## 2012-06-16 DIAGNOSIS — R1032 Left lower quadrant pain: Secondary | ICD-10-CM

## 2012-06-16 DIAGNOSIS — D649 Anemia, unspecified: Secondary | ICD-10-CM

## 2012-06-16 DIAGNOSIS — E538 Deficiency of other specified B group vitamins: Secondary | ICD-10-CM

## 2012-06-16 LAB — POCT CBC
Granulocyte percent: 76.4 %G (ref 37–80)
HCT, POC: 38.5 % (ref 37.7–47.9)
Hemoglobin: 11.5 g/dL — AB (ref 12.2–16.2)
Lymph, poc: 1.9 (ref 0.6–3.4)
MCH, POC: 27.1 pg (ref 27–31.2)
MCHC: 29.9 g/dL — AB (ref 31.8–35.4)
MCV: 90.8 fL (ref 80–97)
MID (cbc): 0.5 (ref 0–0.9)
MPV: 9 fL (ref 0–99.8)
POC Granulocyte: 7.8 — AB (ref 2–6.9)
POC LYMPH PERCENT: 18.9 %L (ref 10–50)
POC MID %: 4.7 %M (ref 0–12)
Platelet Count, POC: 339 10*3/uL (ref 142–424)
RBC: 4.24 M/uL (ref 4.04–5.48)
RDW, POC: 13.4 %
WBC: 10.2 10*3/uL (ref 4.6–10.2)

## 2012-06-16 LAB — POCT URINALYSIS DIPSTICK
Bilirubin, UA: NEGATIVE
Blood, UA: NEGATIVE
Glucose, UA: NEGATIVE
Leukocytes, UA: NEGATIVE
Nitrite, UA: NEGATIVE
Protein, UA: NEGATIVE
Spec Grav, UA: 1.015
Urobilinogen, UA: 0.2
pH, UA: 7.5

## 2012-06-16 LAB — POCT UA - MICROSCOPIC ONLY
Bacteria, U Microscopic: NEGATIVE
Casts, Ur, LPF, POC: NEGATIVE
Crystals, Ur, HPF, POC: NEGATIVE
Epithelial cells, urine per micros: NEGATIVE
Mucus, UA: NEGATIVE
RBC, urine, microscopic: NEGATIVE
WBC, Ur, HPF, POC: NEGATIVE
Yeast, UA: NEGATIVE

## 2012-06-16 MED ORDER — METRONIDAZOLE 500 MG PO TABS: 250.0000 mg | ORAL_TABLET | Freq: Three times a day (TID) | ORAL | Status: DC

## 2012-06-16 MED ORDER — CIPROFLOXACIN HCL 250 MG PO TABS: 250.0000 mg | ORAL_TABLET | Freq: Two times a day (BID) | ORAL | Status: DC

## 2012-06-16 NOTE — Progress Notes (Signed)
78 yo woman, patient of Dr. Amador Cunas, who presents with one day of lower abdominal pain.  She also complains of being cold all the time.  The abdominal pain is an ongoing  Problem. Denies diarrhea, constipation, dysuria, fever, vomiting  She has had a colonoscopy which showed diverticuli in the past  Objective:  Patient looks worried; patient seen with son who brought her by private vehicle to facility Chest:  Clear Heart:  Regular, no murmur Abdomen:  Soft, mildly tender LLQ with deep palpation, no guarding or rebound. Skin: no abdominal rash [facility] reading (PRIMARY) by  Dr. Milus Glazier  KUB.  nonsp gas pattern Results for orders placed in visit on 06/16/12  POCT CBC      Component Value Range   WBC 10.2  4.6 - 10.2 K/uL   Lymph, poc 1.9  0.6 - 3.4   POC LYMPH PERCENT 18.9  10 - 50 %L   MID (cbc) 0.5  0 - 0.9   POC MID % 4.7  0 - 12 %M   POC Granulocyte 7.8 (*) 2 - 6.9   Granulocyte percent 76.4  37 - 80 %G   RBC 4.24  4.04 - 5.48 M/uL   Hemoglobin 11.5 (*) 12.2 - 16.2 g/dL   HCT, POC 40.9  81.1 - 47.9 %   MCV 90.8  80 - 97 fL   MCH, POC 27.1  27 - 31.2 pg   MCHC 29.9 (*) 31.8 - 35.4 g/dL   RDW, POC 91.4     Platelet Count, POC 339  142 - 424 K/uL   MPV 9.0  0 - 99.8 fL  POCT UA - MICROSCOPIC ONLY      Component Value Range   WBC, Ur, HPF, POC neg     RBC, urine, microscopic neg     Bacteria, U Microscopic neg     Mucus, UA neg     Epithelial cells, urine per micros neg     Crystals, Ur, HPF, POC neg     Casts, Ur, LPF, POC neg     Yeast, UA neg    POCT URINALYSIS DIPSTICK      Component Value Range   Color, UA yellow     Clarity, UA clear     Glucose, UA neg     Bilirubin, UA neg     Ketones, UA 40mg      Spec Grav, UA 1.015     Blood, UA neg     pH, UA 7.5     Protein, UA neg     Urobilinogen, UA 0.2     Nitrite, UA neg     Leukocytes, UA Negative      Assessment:   Diverticulitis  1. LLQ abdominal pain  POCT CBC, POCT UA - Microscopic Only, POCT urinalysis  dipstick, DG Abd 1 View, ciprofloxacin (CIPRO) 250 MG tablet, metroNIDAZOLE (FLAGYL) 500 MG tablet  2. Anemia  Vitamin B12  3. B12 deficiency      Follow up in 24 hours Check B12    04/11/11 office note Ms. Pieper is an 78 year old female who was evaluated by Dr. Arlyce Dice in September 2011 for anemia, weight loss and abdominal pain. She had a normal EGD in October 2011. Her colonoscopy was pertinent for severe diverticulosis Following endoscopic workup a CT scan of the abdomen and pelvis with IV contrast was done and it was unremarkable. Patient comes in today for evaluation of reflux. Complains of a "knot" in distal sternum, has problems taking a deep  breath sometimes. Son accompanies her today and helps with the history. Per the son, patient sometimes complains of chest and epigastric burning unrelated to meals. No dysphagia. Ms. Guinther was admitted to the hospital June 9 with atypical chest pain. Her EKG was negative for any significant changes, cardiac enzymes were negative. CXR was negative for acute abnormalities. She was readmitted in July for chest pain, a Myoview was negative for ischemia. Her pain was ultimately felt to be secondary to GERD. Patient gives a longstanding history of GERD for which she has taken twice daily Prilosec for years. Since hospital discharge patient has been taking Maalox 2-3 times a day. She was also recently started on Reglan before meals. Her pain, which she describes as epigastric, has improved. Her main concern is inability to take a deep breath sometimes.

## 2012-06-16 NOTE — Patient Instructions (Addendum)
Follow up in 24 hours   Diverticulitis A diverticulum is a small pouch or sac on the colon. Diverticulosis is the presence of these diverticula on the colon. Diverticulitis is the irritation (inflammation) or infection of diverticula. CAUSES  The colon and its diverticula contain bacteria. If food particles block the tiny opening to a diverticulum, the bacteria inside can grow and cause an increase in pressure. This leads to infection and inflammation and is called diverticulitis. SYMPTOMS   Abdominal pain and tenderness. Usually, the pain is located on the left side of your abdomen. However, it could be located elsewhere.  Fever.  Bloating.  Feeling sick to your stomach (nausea).  Throwing up (vomiting).  Abnormal stools. DIAGNOSIS  Your caregiver will take a history and perform a physical exam. Since many things can cause abdominal pain, other tests may be necessary. Tests may include:  Blood tests.  Urine tests.  X-ray of the abdomen.  CT scan of the abdomen. Sometimes, surgery is needed to determine if diverticulitis or other conditions are causing your symptoms. TREATMENT  Most of the time, you can be treated without surgery. Treatment includes:  Resting the bowels by only having liquids for a few days. As you improve, you will need to eat a low-fiber diet.  Intravenous (IV) fluids if you are losing body fluids (dehydrated).  Antibiotic medicines that treat infections may be given.  Pain and nausea medicine, if needed.  Surgery if the inflamed diverticulum has burst. HOME CARE INSTRUCTIONS   Try a clear liquid diet (broth, tea, or water for as long as directed by your caregiver). You may then gradually begin a low-fiber diet as tolerated. A low-fiber diet is a diet with less than 10 grams of fiber. Choose the foods below to reduce fiber in the diet:  White breads, cereals, rice, and pasta.  Cooked fruits and vegetables or soft fresh fruits and vegetables without  the skin.  Ground or well-cooked tender beef, ham, veal, lamb, pork, or poultry.  Eggs and seafood.  After your diverticulitis symptoms have improved, your caregiver may put you on a high-fiber diet. A high-fiber diet includes 14 grams of fiber for every 1000 calories consumed. For a standard 2000 calorie diet, you would need 28 grams of fiber. Follow these diet guidelines to help you increase the fiber in your diet. It is important to slowly increase the amount fiber in your diet to avoid gas, constipation, and bloating.  Choose whole-grain breads, cereals, pasta, and brown rice.  Choose fresh fruits and vegetables with the skin on. Do not overcook vegetables because the more vegetables are cooked, the more fiber is lost.  Choose more nuts, seeds, legumes, dried peas, beans, and lentils.  Look for food products that have greater than 3 grams of fiber per serving on the Nutrition Facts label.  Take all medicine as directed by your caregiver.  If your caregiver has given you a follow-up appointment, it is very important that you go. Not going could result in lasting (chronic) or permanent injury, pain, and disability. If there is any problem keeping the appointment, call to reschedule. SEEK MEDICAL CARE IF:   Your pain does not improve.  You have a hard time advancing your diet beyond clear liquids.  Your bowel movements do not return to normal. SEEK IMMEDIATE MEDICAL CARE IF:   Your pain becomes worse.  You have an oral temperature above 102 F (38.9 C), not controlled by medicine.  You have repeated vomiting.  You have  bloody or black, tarry stools.  Symptoms that brought you to your caregiver become worse or are not getting better. MAKE SURE YOU:   Understand these instructions.  Will watch your condition.  Will get help right away if you are not doing well or get worse. Document Released: 05/29/2005 Document Revised: 11/11/2011 Document Reviewed: 09/24/2010 [facility]  Patient Information 2013 [place], [place].

## 2012-06-17 ENCOUNTER — Ambulatory Visit (INDEPENDENT_AMBULATORY_CARE_PROVIDER_SITE_OTHER): Payer: Medicare Other | Admitting: Family Medicine

## 2012-06-17 VITALS — BP 144/68 | HR 62 | Temp 98.1°F | Resp 18 | Ht 62.0 in | Wt 138.0 lb

## 2012-06-17 DIAGNOSIS — K5732 Diverticulitis of large intestine without perforation or abscess without bleeding: Secondary | ICD-10-CM

## 2012-06-17 DIAGNOSIS — K5792 Diverticulitis of intestine, part unspecified, without perforation or abscess without bleeding: Secondary | ICD-10-CM

## 2012-06-17 LAB — VITAMIN B12: Vitamin B-12: 895 pg/mL (ref 211–911)

## 2012-06-17 NOTE — Progress Notes (Signed)
78 year old woman the seen yesterday with her son because of what we diagnosis early diverticulitis. Since starting her antibiotics last night, patient has noted decreased pain and has slept well. She's had no nausea or vomiting or diarrhea. She's had no fever.  Objective: No acute distress-patient is anxious and upset that she had to wait an hour for this visit She is seen with her son Chest is clear Heart: Regular no murmur Abdomen soft nontender without HSM, guarding, rebound  Skin: Warm and dry  Assessment: Marked improvement from yesterday  Plan: Finish antibiotics

## 2012-06-19 ENCOUNTER — Telehealth: Payer: Self-pay | Admitting: Internal Medicine

## 2012-06-19 MED ORDER — SERTRALINE HCL 25 MG PO TABS: 25.0000 mg | ORAL_TABLET | Freq: Every morning | ORAL | Status: DC

## 2012-06-19 NOTE — Telephone Encounter (Signed)
I spoke with the son and he is concerned because the patient is very restless.  He states that the patient is "worried about dying" and wakes him up as early as 3:30 in the morning.  She is taking Ativan but it does not seem to help.  He would like to know if Zoloft can be added or if she should come in for an office visit?

## 2012-06-19 NOTE — Telephone Encounter (Signed)
Spoke with son, Rx sent, appointment made

## 2012-06-19 NOTE — Telephone Encounter (Signed)
Sertraline 25 mg every morning. Please call and a prescription for #60. Suggest office visit in 4 weeks

## 2012-06-19 NOTE — Telephone Encounter (Signed)
Pts son called and said that he tried calling [facility] and has not gotten a call back. Says his mom seems to be depressed. Pts bp isn't stable. Physically pt seems ok, but not mentally. Req call back from Kim this morning, before noon today.

## 2012-06-22 ENCOUNTER — Telehealth: Payer: Self-pay | Admitting: Internal Medicine

## 2012-06-22 NOTE — Telephone Encounter (Signed)
rov this week with any provider

## 2012-06-22 NOTE — Telephone Encounter (Signed)
Caller: [name]; Patient Name: [name]; PCP: Eleonore Chiquito [facility]); Best Callback Phone Number: [phone]; Reason for call: abdominal discomfort and bloating/headache/left breast pain. Afebrile.  Onset 06/22/12. Pt diagnosed with Divierticulitis on 06/19/12.  Pt is taking anitbiotics as prescribed.  Pt continues to have intermittent left lower quardrant pain with lower  back pain.  All emergent symptoms ruled out  per Abdmoninal Pain with excpetion to 'Abdominal pain and sickle cell disease, prophyria or diabetes.'.  Call Provider Immediately. Per profile will send note to office.  Offered appt and pt says she can't come to early am appts.     PLEASE FOLLOW WITH CALLER IN REGARD TO APPT/Symptoms.   PT UNABLE TO MAKE 10:15 APPT 06/22/12.  Thank you.

## 2012-06-22 NOTE — Telephone Encounter (Signed)
Please make appt tommorrow for this pt

## 2012-06-22 NOTE — Telephone Encounter (Signed)
Please advise - no appt avible with you tomorrow

## 2012-06-22 NOTE — Telephone Encounter (Signed)
LMOM for pt to call back and schedule an appt

## 2012-06-23 NOTE — Telephone Encounter (Signed)
Pt is sch with Dr Artist Pais 06-24-12

## 2012-06-24 ENCOUNTER — Encounter: Payer: Self-pay | Admitting: Internal Medicine

## 2012-06-24 ENCOUNTER — Ambulatory Visit (INDEPENDENT_AMBULATORY_CARE_PROVIDER_SITE_OTHER): Payer: Medicare Other | Admitting: Internal Medicine

## 2012-06-24 VITALS — BP 144/70 | Temp 98.5°F | Wt 141.0 lb

## 2012-06-24 DIAGNOSIS — E119 Type 2 diabetes mellitus without complications: Secondary | ICD-10-CM

## 2012-06-24 DIAGNOSIS — I10 Essential (primary) hypertension: Secondary | ICD-10-CM

## 2012-06-24 DIAGNOSIS — N289 Disorder of kidney and ureter, unspecified: Secondary | ICD-10-CM

## 2012-06-24 DIAGNOSIS — R1032 Left lower quadrant pain: Secondary | ICD-10-CM | POA: Insufficient documentation

## 2012-06-24 LAB — CBC WITH DIFFERENTIAL/PLATELET
Basophils Absolute: 0 10*3/uL (ref 0.0–0.1)
Basophils Relative: 0.3 % (ref 0.0–3.0)
Eosinophils Relative: 0 % (ref 0.0–5.0)
Hemoglobin: 10.9 g/dL — ABNORMAL LOW (ref 12.0–15.0)
Lymphocytes Relative: 16.9 % (ref 12.0–46.0)
Monocytes Relative: 6.8 % (ref 3.0–12.0)
Neutro Abs: 8.4 10*3/uL — ABNORMAL HIGH (ref 1.4–7.7)
RBC: 3.87 Mil/uL (ref 3.87–5.11)

## 2012-06-24 LAB — POCT URINALYSIS DIPSTICK
Leukocytes, UA: NEGATIVE
Protein, UA: NEGATIVE

## 2012-06-24 LAB — BASIC METABOLIC PANEL
Calcium: 9.4 mg/dL (ref 8.4–10.5)
GFR: 54.46 mL/min — ABNORMAL LOW (ref >60.00)
Sodium: 139 mEq/L (ref 135–145)

## 2012-06-24 MED ORDER — CIPROFLOXACIN HCL 500 MG PO TABS: 500.0000 mg | ORAL_TABLET | Freq: Two times a day (BID) | ORAL | Status: DC

## 2012-06-24 MED ORDER — METRONIDAZOLE 500 MG PO TABS: 500.0000 mg | ORAL_TABLET | Freq: Three times a day (TID) | ORAL | Status: DC

## 2012-06-24 NOTE — Assessment & Plan Note (Signed)
I agree patient likely experiencing flare of diverticulitis. She is having recurrent symptoms after finishing antibiotics. Patient prescribed another course of Cipro and Flagyl. Obtain CBCD and sedimentation rate. If she has persistent or worsening symptoms, she'll likely need CT of abdomen and pelvis.  Follow up with PCP within 1 week.

## 2012-06-24 NOTE — Patient Instructions (Addendum)
Hold lasix Hold metformin Increase fluid intake Our office will contact you re: blood test results Check your blood sugar daily.  Call our office if your blood sugars are greater than 200 in the morning (before eating breakfast) Follow up with Dr. Amador Cunas within 1 week Please call our office if your symptoms do not improve or gets worse.

## 2012-06-24 NOTE — Progress Notes (Signed)
Subjective:    Patient ID: [name], female    DOB: Dec 24, 1922, 78 y.o.   MRN: [id number]  HPI  78 year old white female with history of type 2 diabetes, hypertension and diverticulosis complains of abdominal pain and headache. Her symptoms started 2 weeks ago and she was seen at [facility] urgent care. She was diagnosed with possible diverticulitis and given course of Cipro and Flagyl. Patient completed course  but soon thereafter, her son reports she had recurrent symptoms yesterday. She felt chills and left lower quadrant pain. Her symptoms are not as bad today.  Chart review-her serum creatinine elevated in September. It is 1.6. She is on metformin xr 500 mg once daily.  Review of Systems    She denies any urinary symptoms.  Past Medical History  Diagnosis Date  . ANEMIA, DEFICIENCY, HX OF 03/01/2010  . DEPRESSIVE DISORDER 09/19/2010  . DM w/o Complication Type II 05/18/2007  . GERD 05/18/2007  . HYPERLIPIDEMIA 03/17/2008  . HYPERTENSION 05/18/2007  . INSOMNIA 11/23/2008  . Loss of weight 05/30/2010  . Kidney stone   . Glaucoma(365)   . Anxiety     History   Social History  . Marital Status: Widowed    Spouse Name: N/A    Number of Children: 3  . Years of Education: N/A   Occupational History  . retired    Social History Main Topics  . Smoking status: Never Smoker   . Smokeless tobacco: Never Used  . Alcohol Use: No  . Drug Use: No  . Sexually Active: Not on file   Other Topics Concern  . Not on file   Social History Narrative  . No narrative on file    Past Surgical History  Procedure Date  . Abdominal hysterectomy   . Cataract extraction   . Hemorrhoid surgery   . Appendectomy   . Pacemaker insertion 09/2011  . Dilation and curettage of uterus     Family History  Problem Relation Age of Onset  . Brain cancer Father   . Colon cancer Sister   . Colitis Brother   . Heart attack Brother     Allergies  Allergen Reactions  . Amoxicillin    REACTION: unspecified  . Penicillins     REACTION: rash    Current Outpatient Prescriptions on File Prior to Visit  Medication Sig Dispense Refill  . acetaminophen (TYLENOL) 500 MG tablet Take 1,000 mg by mouth every 6 (six) hours as needed. For pain      . ALPRAZolam (XANAX) 0.5 MG tablet Take 0.5 mg by mouth 3 (three) times daily as needed. Agitation      . aspirin 81 MG tablet Take 81 mg by mouth daily.        Marland Kitchen bismuth subsalicylate (PEPTO BISMOL) 262 MG/15ML suspension Take 15 mLs by mouth every 6 (six) hours as needed. Stomach      . famotidine (PEPCID) 20 MG tablet Take 1 tablet (20 mg total) by mouth 2 (two) times daily.  30 tablet  0  . ferrous sulfate (FERROUSUL) 325 (65 FE) MG tablet Take 1 tablet (325 mg total) by mouth 2 (two) times daily with a meal.  30 tablet  11  . furosemide (LASIX) 20 MG tablet TAKE 1 TABLET ONCE A DAY TO TWICE A DAY TO CONTROL SWELLING  60 tablet  2  . LORazepam (ATIVAN) 0.5 MG tablet Take 0.5-1 mg by mouth 2 (two) times daily. Take 1 tablet in the morning, and 2 tablets in  the evening.      . Multiple Vitamin (MULTIVITAMIN WITH MINERALS) TABS Take 1 tablet by mouth daily.      . ondansetron (ZOFRAN) 4 MG tablet Take 1 tablet (4 mg total) by mouth every 6 (six) hours.  12 tablet  0  . sertraline (ZOLOFT) 25 MG tablet Take 1 tablet (25 mg total) by mouth every morning.  60 tablet  0  . simvastatin (ZOCOR) 80 MG tablet TAKE 1/2 TABLET BY MOUTH EVERY DAY  45 tablet  2  . traMADol (ULTRAM) 50 MG tablet Take 1 tablet (50 mg total) by mouth every 8 (eight) hours as needed for pain.  60 tablet  4    BP 144/70  Temp 98.5 F (36.9 C) (Oral)  Wt 141 lb (63.957 kg)    Objective:   Physical Exam  Constitutional: She appears well-developed and well-nourished.  HENT:  Head: Normocephalic and atraumatic.  Mouth/Throat: Oropharynx is clear and moist.  Cardiovascular: Regular rhythm and normal heart sounds.   Pulmonary/Chest: Effort normal and breath sounds  normal. She has no wheezes.  Abdominal: Soft. Bowel sounds are normal. She exhibits no mass. There is no rebound and no guarding.       Mild LLQ tenderness  Neurological: No cranial nerve deficit. Coordination normal.  Psychiatric: She has a normal mood and affect. Her behavior is normal.          Assessment & Plan:

## 2012-06-24 NOTE — Assessment & Plan Note (Addendum)
Hold lasix.  Increase fluids. BP: 144/70 mmHg  Lab Results  Component Value Date   CREATININE 1.0 06/24/2012

## 2012-06-24 NOTE — Assessment & Plan Note (Signed)
Hold metformin since her creatinine is 1.6. Her son reports she used NSAIDs with the last 1 to 2 months. Her previous renal function is normal.  Patient advised to monitor blood sugar at home.

## 2012-07-02 ENCOUNTER — Encounter: Payer: Self-pay | Admitting: Internal Medicine

## 2012-07-02 ENCOUNTER — Ambulatory Visit (INDEPENDENT_AMBULATORY_CARE_PROVIDER_SITE_OTHER): Payer: Medicare Other | Admitting: Internal Medicine

## 2012-07-02 VITALS — BP 140/80 | Wt 140.0 lb

## 2012-07-02 DIAGNOSIS — I10 Essential (primary) hypertension: Secondary | ICD-10-CM

## 2012-07-02 DIAGNOSIS — I4891 Unspecified atrial fibrillation: Secondary | ICD-10-CM

## 2012-07-02 DIAGNOSIS — E119 Type 2 diabetes mellitus without complications: Secondary | ICD-10-CM

## 2012-07-02 DIAGNOSIS — R1032 Left lower quadrant pain: Secondary | ICD-10-CM

## 2012-07-02 MED ORDER — HYOSCYAMINE SULFATE 0.125 MG PO TABS: 0.1250 mg | ORAL_TABLET | ORAL | Status: DC | PRN

## 2012-07-02 NOTE — Patient Instructions (Signed)
Tramadol for pain every 6 hours  Align   one daily  High fiber diet  Trial Levsin every 4 hours as needed for pain

## 2012-07-02 NOTE — Progress Notes (Signed)
  Subjective:    Patient ID: [name], female    DOB: 12-May-1923, 78 y.o.   MRN: [id number]  HPI  78 year old patient who is followed for multiple medical problems more recently she has been seen in the clinic here and also at the urgent care and treated for suspected diverticulitis. She has had 2 rounds of antibiotic therapy. She states that she is unimproved she complains of fairly diffuse lower abdominal pain. This is not well localized. She also complains of headaches and a general sense of unwellness. She does have a history of anxiety/panic disorder.  Recent laboratory studies have been unremarkable. Repeat creatinine normalized to 1.0  Wt Readings from Last 3 Encounters:  07/02/12 140 lb (63.504 kg)  06/24/12 141 lb (63.957 kg)  06/17/12 138 lb (62.596 kg)      Review of Systems  Constitutional: Positive for fatigue.  HENT: Negative for hearing loss, congestion, sore throat, rhinorrhea, dental problem, sinus pressure and tinnitus.   Eyes: Negative for pain, discharge and visual disturbance.  Respiratory: Negative for cough and shortness of breath.   Cardiovascular: Negative for chest pain, palpitations and leg swelling.  Gastrointestinal: Positive for abdominal pain. Negative for nausea, vomiting, diarrhea, constipation, blood in stool and abdominal distention.  Genitourinary: Negative for dysuria, urgency, frequency, hematuria, flank pain, vaginal bleeding, vaginal discharge, difficulty urinating, vaginal pain and pelvic pain.  Musculoskeletal: Negative for joint swelling, arthralgias and gait problem.  Skin: Negative for rash.  Neurological: Positive for weakness and headaches. Negative for dizziness, syncope, speech difficulty and numbness.  Hematological: Negative for adenopathy.  Psychiatric/Behavioral: Negative for behavioral problems, dysphoric mood and agitation. The patient is not nervous/anxious.        Objective:   Physical Exam  Constitutional: She is oriented  to person, place, and time. She appears well-developed and well-nourished.  HENT:  Head: Normocephalic.  Right Ear: External ear normal.  Left Ear: External ear normal.  Mouth/Throat: Oropharynx is clear and moist.  Eyes: Conjunctivae normal and EOM are normal. Pupils are equal, round, and reactive to light.  Neck: Normal range of motion. Neck supple. No thyromegaly present.  Cardiovascular: Normal rate, regular rhythm, normal heart sounds and intact distal pulses.   Pulmonary/Chest: Effort normal and breath sounds normal.  Abdominal: Soft. Bowel sounds are normal. She exhibits no distension and no mass. There is no tenderness. There is no rebound and no guarding.  Musculoskeletal: Normal range of motion.  Lymphadenopathy:    She has no cervical adenopathy.  Neurological: She is alert and oriented to person, place, and time.  Skin: Skin is warm and dry. No rash noted.  Psychiatric: She has a normal mood and affect. Her behavior is normal.          Assessment & Plan:     Abdominal pain-  Unremarkable clinical exam;  Will place on Align one daily nad observe; continue tramadol HTN-stable

## 2012-07-11 ENCOUNTER — Encounter (HOSPITAL_COMMUNITY): Payer: Self-pay | Admitting: Physical Medicine and Rehabilitation

## 2012-07-11 ENCOUNTER — Emergency Department (HOSPITAL_COMMUNITY)
Admission: EM | Admit: 2012-07-11 | Discharge: 2012-07-11 | Disposition: A | Discharge status: Home / Self Care | Payer: Medicare Other | Attending: Emergency Medicine | Admitting: Emergency Medicine

## 2012-07-11 DIAGNOSIS — F411 Generalized anxiety disorder: Secondary | ICD-10-CM | POA: Insufficient documentation

## 2012-07-11 DIAGNOSIS — K219 Gastro-esophageal reflux disease without esophagitis: Secondary | ICD-10-CM | POA: Insufficient documentation

## 2012-07-11 DIAGNOSIS — F329 Major depressive disorder, single episode, unspecified: Secondary | ICD-10-CM | POA: Insufficient documentation

## 2012-07-11 DIAGNOSIS — I10 Essential (primary) hypertension: Secondary | ICD-10-CM | POA: Insufficient documentation

## 2012-07-11 DIAGNOSIS — M79669 Pain in unspecified lower leg: Secondary | ICD-10-CM

## 2012-07-11 DIAGNOSIS — R51 Headache: Secondary | ICD-10-CM | POA: Insufficient documentation

## 2012-07-11 DIAGNOSIS — Z7982 Long term (current) use of aspirin: Secondary | ICD-10-CM | POA: Insufficient documentation

## 2012-07-11 DIAGNOSIS — D649 Anemia, unspecified: Secondary | ICD-10-CM | POA: Insufficient documentation

## 2012-07-11 DIAGNOSIS — N200 Calculus of kidney: Secondary | ICD-10-CM | POA: Insufficient documentation

## 2012-07-11 DIAGNOSIS — M79609 Pain in unspecified limb: Secondary | ICD-10-CM | POA: Insufficient documentation

## 2012-07-11 DIAGNOSIS — Z792 Long term (current) use of antibiotics: Secondary | ICD-10-CM | POA: Insufficient documentation

## 2012-07-11 DIAGNOSIS — E119 Type 2 diabetes mellitus without complications: Secondary | ICD-10-CM | POA: Insufficient documentation

## 2012-07-11 DIAGNOSIS — E785 Hyperlipidemia, unspecified: Secondary | ICD-10-CM | POA: Insufficient documentation

## 2012-07-11 DIAGNOSIS — F3289 Other specified depressive episodes: Secondary | ICD-10-CM | POA: Insufficient documentation

## 2012-07-11 DIAGNOSIS — R109 Unspecified abdominal pain: Secondary | ICD-10-CM | POA: Insufficient documentation

## 2012-07-11 DIAGNOSIS — Z79899 Other long term (current) drug therapy: Secondary | ICD-10-CM | POA: Insufficient documentation

## 2012-07-11 DIAGNOSIS — G4700 Insomnia, unspecified: Secondary | ICD-10-CM | POA: Insufficient documentation

## 2012-07-11 DIAGNOSIS — Z8669 Personal history of other diseases of the nervous system and sense organs: Secondary | ICD-10-CM | POA: Insufficient documentation

## 2012-07-11 LAB — COMPREHENSIVE METABOLIC PANEL
ALT: 10 U/L (ref 0–35)
Albumin: 3.7 g/dL (ref 3.5–5.2)
Alkaline Phosphatase: 29 U/L — ABNORMAL LOW (ref 39–117)
Potassium: 3.9 mEq/L (ref 3.5–5.1)
Sodium: 137 mEq/L (ref 135–145)
Total Protein: 6.8 g/dL (ref 6.0–8.3)

## 2012-07-11 LAB — GLUCOSE, CAPILLARY: Glucose-Capillary: 128 mg/dL — ABNORMAL HIGH (ref 70–99)

## 2012-07-11 LAB — CBC WITH DIFFERENTIAL/PLATELET
Basophils Absolute: 0 10*3/uL (ref 0.0–0.1)
Basophils Relative: 0 % (ref 0–1)
Eosinophils Absolute: 0 10*3/uL (ref 0.0–0.7)
MCH: 29.1 pg (ref 26.0–34.0)
MCHC: 32.9 g/dL (ref 30.0–36.0)
Neutrophils Relative %: 73 % (ref 43–77)
Platelets: 293 10*3/uL (ref 150–400)

## 2012-07-11 MED ORDER — IBUPROFEN 400 MG PO TABS: 600.0000 mg | ORAL_TABLET | Freq: Once | ORAL | Status: DC

## 2012-07-11 MED ORDER — IBUPROFEN 200 MG PO TABS
600.0000 mg | ORAL_TABLET | Freq: Once | ORAL | Status: AC
  Administered 2012-07-11: 600 mg via ORAL
  Filled 2012-07-11: qty 1

## 2012-07-11 NOTE — ED Notes (Signed)
Pt presents to department for evaluation of headaches, chills, and abdominal pain. Ongoing for several months. Pt denies pain at the time. She is alert and oriented x4. No signs of distress noted.

## 2012-07-11 NOTE — ED Provider Notes (Signed)
History     CSN: [id number]  Arrival date & time 07/11/12  1529   First MD Initiated Contact with Patient 07/11/12 1615      Chief Complaint  Patient presents with  . Chills  . Headache  . Abdominal Pain     HPI Patient presents with complaint of pain to both lower legs feeling cold and headache.  This being the one on for several months to years.  She seen her doctor about it before and they have never discovered what the problem was.  Patient had no fever in the emergency department.  Patient denies to sure you or cough. Past Medical History  Diagnosis Date  . ANEMIA, DEFICIENCY, HX OF 03/01/2010  . DEPRESSIVE DISORDER 09/19/2010  . DM w/o Complication Type II 05/18/2007  . GERD 05/18/2007  . HYPERLIPIDEMIA 03/17/2008  . HYPERTENSION 05/18/2007  . INSOMNIA 11/23/2008  . Loss of weight 05/30/2010  . Kidney stone   . Glaucoma(365)   . Anxiety     Past Surgical History  Procedure Date  . Abdominal hysterectomy   . Cataract extraction   . Hemorrhoid surgery   . Appendectomy   . Pacemaker insertion 09/2011  . Dilation and curettage of uterus     Family History  Problem Relation Age of Onset  . Brain cancer Father   . Colon cancer Sister   . Colitis Brother   . Heart attack Brother     History  Substance Use Topics  . Smoking status: Never Smoker   . Smokeless tobacco: Never Used  . Alcohol Use: No    OB History    Grav Para Term Preterm Abortions TAB SAB Ect Mult Living                  Review of Systems All other systems reviewed and are negative Allergies  Amoxicillin and Penicillins  Home Medications   Current Outpatient Rx  Name  Route  Sig  Dispense  Refill  . FUROSEMIDE 20 MG PO TABS   Oral   Take 20-40 mg by mouth 2 (two) times daily. Take 1-2 tablets daily to control swelling         . SIMVASTATIN 80 MG PO TABS   Oral   Take 40 mg by mouth at bedtime.         . ACETAMINOPHEN 500 MG PO TABS   Oral   Take 1,000 mg by mouth every 6 (six)  hours as needed. For pain         . ALPRAZOLAM 0.5 MG PO TABS   Oral   Take 0.5 mg by mouth 3 (three) times daily as needed. Agitation         . ASPIRIN 81 MG PO TABS   Oral   Take 81 mg by mouth daily.           Marland Kitchen BISMUTH SUBSALICYLATE 262 MG/15ML PO SUSP   Oral   Take 15 mLs by mouth every 6 (six) hours as needed. Stomach         . CIPROFLOXACIN HCL 500 MG PO TABS   Oral   Take 1 tablet (500 mg total) by mouth 2 (two) times daily.   20 tablet   0   . FAMOTIDINE 20 MG PO TABS   Oral   Take 1 tablet (20 mg total) by mouth 2 (two) times daily.   30 tablet   0   . FERROUS SULFATE 325 (65 FE) MG PO TABS  Oral   Take 1 tablet (325 mg total) by mouth 2 (two) times daily with a meal.   30 tablet   11   . HYOSCYAMINE SULFATE 0.125 MG PO TABS   Oral   Take 1 tablet (0.125 mg total) by mouth every 4 (four) hours as needed for cramping.   30 tablet   0   . LORAZEPAM 0.5 MG PO TABS   Oral   Take 0.5-1 mg by mouth 2 (two) times daily. Take 1 tablet in the morning, and 2 tablets in the evening.         Marland Kitchen METFORMIN HCL ER 500 MG PO TB24   Oral   Take 500 mg by mouth daily.          Marland Kitchen METRONIDAZOLE 500 MG PO TABS   Oral   Take 1 tablet (500 mg total) by mouth 3 (three) times daily.   30 tablet   0   . ADULT MULTIVITAMIN W/MINERALS CH   Oral   Take 1 tablet by mouth daily.         Marland Kitchen ONDANSETRON HCL 4 MG PO TABS   Oral   Take 1 tablet (4 mg total) by mouth every 6 (six) hours.   12 tablet   0   . SERTRALINE HCL 25 MG PO TABS   Oral   Take 1 tablet (25 mg total) by mouth every morning.   60 tablet   0   . TRAMADOL HCL 50 MG PO TABS   Oral   Take 1 tablet (50 mg total) by mouth every 8 (eight) hours as needed for pain.   60 tablet   4     BP 151/59  Pulse 70  Temp 98.1 F (36.7 C) (Oral)  Resp 16  SpO2 98%  Physical Exam  Nursing note and vitals reviewed. Constitutional: She is oriented to person, place, and time. She appears  well-developed and well-nourished. No distress.  HENT:  Head: Normocephalic and atraumatic.  Eyes: Pupils are equal, round, and reactive to light.  Neck: Normal range of motion.  Cardiovascular: Normal rate and intact distal pulses.   Pulmonary/Chest: No respiratory distress. She has no wheezes. She has no rales.  Abdominal: Normal appearance. She exhibits no distension. There is no tenderness. There is no rebound.  Musculoskeletal: Normal range of motion. She exhibits no edema and no tenderness.  Neurological: She is alert and oriented to person, place, and time. No cranial nerve deficit.  Skin: Skin is warm and dry. No rash noted.  Psychiatric: She has a normal mood and affect. Her behavior is normal.    ED Course  Procedures (including critical care time)  Medications  simvastatin (ZOCOR) 80 MG tablet (not administered)  furosemide (LASIX) 20 MG tablet (not administered)  ibuprofen (ADVIL,MOTRIN) tablet 600 mg (not administered)  ibuprofen (ADVIL,MOTRIN) tablet 600 mg (600 mg Oral Given 07/11/12 1711)    Labs Reviewed  CBC WITH DIFFERENTIAL - Abnormal; Notable for the following:    RBC 3.71 (*)     Hemoglobin 10.8 (*)     HCT 32.8 (*)     All other components within normal limits  COMPREHENSIVE METABOLIC PANEL - Abnormal; Notable for the following:    Glucose, Bld 113 (*)     Alkaline Phosphatase 29 (*)     GFR calc non Af Amer 74 (*)     GFR calc Af Amer 86 (*)     All other components within normal limits  GLUCOSE,  CAPILLARY - Abnormal; Notable for the following:    Glucose-Capillary 128 (*)     All other components within normal limits   No results found.   1. Lower leg pain       MDM          Nelia Shi, MD 07/11/12 [phone]

## 2012-07-11 NOTE — ED Notes (Signed)
CBG 128 

## 2012-07-13 ENCOUNTER — Ambulatory Visit (INDEPENDENT_AMBULATORY_CARE_PROVIDER_SITE_OTHER): Payer: Medicare Other | Admitting: Internal Medicine

## 2012-07-13 ENCOUNTER — Encounter: Payer: Self-pay | Admitting: Internal Medicine

## 2012-07-13 VITALS — BP 140/80 | HR 78 | Temp 98.4°F | Resp 16 | Wt 137.3 lb

## 2012-07-13 DIAGNOSIS — I10 Essential (primary) hypertension: Secondary | ICD-10-CM

## 2012-07-13 DIAGNOSIS — R52 Pain, unspecified: Secondary | ICD-10-CM

## 2012-07-13 DIAGNOSIS — M79606 Pain in leg, unspecified: Secondary | ICD-10-CM

## 2012-07-13 DIAGNOSIS — R1013 Epigastric pain: Secondary | ICD-10-CM

## 2012-07-13 DIAGNOSIS — M79609 Pain in unspecified limb: Secondary | ICD-10-CM

## 2012-07-13 MED ORDER — HYDROCODONE-ACETAMINOPHEN 5-500 MG PO TABS: 1.0000 | ORAL_TABLET | Freq: Three times a day (TID) | ORAL | Status: DC | PRN

## 2012-07-13 NOTE — Progress Notes (Signed)
Subjective:    Patient ID: [name], female    DOB: Feb 12, 1923, 78 y.o.   MRN: [id number]  HPI  78 year old patient who has a history of hypertension dyslipidemia and type 2 diabetes. She is seen today in followup after an emergency department visit over the weekend. She presented with back leg and abdominal pain. She does have a history of anxiety/panic disorder. Today she feels reasonably well appears to be in no distress. Laboratory studies were reviewed and were largely unremarkable  Past Medical History  Diagnosis Date  . ANEMIA, DEFICIENCY, HX OF 03/01/2010  . DEPRESSIVE DISORDER 09/19/2010  . DM w/o Complication Type II 05/18/2007  . GERD 05/18/2007  . HYPERLIPIDEMIA 03/17/2008  . HYPERTENSION 05/18/2007  . INSOMNIA 11/23/2008  . Loss of weight 05/30/2010  . Kidney stone   . Glaucoma(365)   . Anxiety     History   Social History  . Marital Status: Widowed    Spouse Name: N/A    Number of Children: 3  . Years of Education: N/A   Occupational History  . retired    Social History Main Topics  . Smoking status: Never Smoker   . Smokeless tobacco: Never Used  . Alcohol Use: No  . Drug Use: No  . Sexually Active: Not on file   Other Topics Concern  . Not on file   Social History Narrative  . No narrative on file    Past Surgical History  Procedure Date  . Abdominal hysterectomy   . Cataract extraction   . Hemorrhoid surgery   . Appendectomy   . Pacemaker insertion 09/2011  . Dilation and curettage of uterus     Family History  Problem Relation Age of Onset  . Brain cancer Father   . Colon cancer Sister   . Colitis Brother   . Heart attack Brother     Allergies  Allergen Reactions  . Amoxicillin Rash  . Penicillins Rash    Current Outpatient Prescriptions on File Prior to Visit  Medication Sig Dispense Refill  . acetaminophen (TYLENOL) 500 MG tablet Take 1,000 mg by mouth every 6 (six) hours as needed. For pain      . ALPRAZolam (XANAX) 0.5 MG  tablet Take 0.5 mg by mouth 3 (three) times daily as needed. Agitation      . bismuth subsalicylate (PEPTO BISMOL) 262 MG/15ML suspension Take 15 mLs by mouth every 6 (six) hours as needed. Stomach      . ciprofloxacin (CIPRO) 500 MG tablet Take 250 mg by mouth 2 (two) times daily.      . furosemide (LASIX) 20 MG tablet Take 20 mg by mouth at bedtime.       . hyoscyamine (LEVSIN) 0.125 MG tablet Take 1 tablet (0.125 mg total) by mouth every 4 (four) hours as needed for cramping.  30 tablet  0  . LORazepam (ATIVAN) 0.5 MG tablet Take 0.5-1 mg by mouth 2 (two) times daily. Take 1 tablet in the morning, and 2 tablets in the evening.      . metFORMIN (GLUCOPHAGE-XR) 500 MG 24 hr tablet Take 500 mg by mouth daily.       . metroNIDAZOLE (FLAGYL) 500 MG tablet Take 1 tablet (500 mg total) by mouth 3 (three) times daily.  30 tablet  0  . metroNIDAZOLE (FLAGYL) 500 MG tablet Take 250 mg by mouth 2 (two) times daily.      . Multiple Vitamin (MULTIVITAMIN WITH MINERALS) TABS Take 1 tablet by mouth  daily.      . sertraline (ZOLOFT) 25 MG tablet Take 25 mg by mouth every morning.      . traMADol (ULTRAM) 50 MG tablet Take 1 tablet (50 mg total) by mouth every 8 (eight) hours as needed for pain.  60 tablet  4    BP 140/80  Pulse 78  Temp 98.4 F (36.9 C) (Oral)  Resp 16  Wt 137 lb 5 oz (62.285 kg)       Review of Systems  Constitutional: Negative.   HENT: Negative for hearing loss, congestion, sore throat, rhinorrhea, dental problem, sinus pressure and tinnitus.   Eyes: Negative for pain, discharge and visual disturbance.  Respiratory: Negative for cough and shortness of breath.   Cardiovascular: Negative for chest pain, palpitations and leg swelling.  Gastrointestinal: Positive for abdominal pain. Negative for nausea, vomiting, diarrhea, constipation, blood in stool and abdominal distention.  Genitourinary: Negative for dysuria, urgency, frequency, hematuria, flank pain, vaginal bleeding, vaginal  discharge, difficulty urinating, vaginal pain and pelvic pain.  Musculoskeletal: Positive for back pain and arthralgias. Negative for joint swelling and gait problem.  Skin: Negative for rash.  Neurological: Negative for dizziness, syncope, speech difficulty, weakness, numbness and headaches.  Hematological: Negative for adenopathy.  Psychiatric/Behavioral: Negative for behavioral problems, dysphoric mood and agitation. The patient is nervous/anxious.        Objective:   Physical Exam  Constitutional: She is oriented to person, place, and time. She appears well-developed and well-nourished. No distress.  HENT:  Head: Normocephalic.  Right Ear: External ear normal.  Left Ear: External ear normal.  Mouth/Throat: Oropharynx is clear and moist.  Eyes: Conjunctivae normal and EOM are normal. Pupils are equal, round, and reactive to light.  Neck: Normal range of motion. Neck supple. No thyromegaly present.  Cardiovascular: Normal rate, regular rhythm, normal heart sounds and intact distal pulses.   Pulmonary/Chest: Effort normal and breath sounds normal.  Abdominal: Soft. Bowel sounds are normal. She exhibits no mass. There is no tenderness.  Musculoskeletal: Normal range of motion.  Lymphadenopathy:    She has no cervical adenopathy.  Neurological: She is alert and oriented to person, place, and time.  Skin: Skin is warm and dry. No rash noted.  Psychiatric: She has a normal mood and affect. Her behavior is normal.          Assessment & Plan:   Chronic intermittent abdominal pain Hypertension Anxiety disorder Type 2 diabetes  Situation discussed at length with her son we'll try a different analgesic. The patient will be treated with a more liberal dosing of alprazolam Recheck 3 months

## 2012-07-13 NOTE — Patient Instructions (Signed)
Limit your sodium (Salt) intake   Please check your hemoglobin A1c every 3 months   

## 2012-07-20 ENCOUNTER — Ambulatory Visit: Payer: Medicare Other | Admitting: Internal Medicine

## 2012-07-27 ENCOUNTER — Emergency Department (HOSPITAL_COMMUNITY)
Admission: EM | Admit: 2012-07-27 | Discharge: 2012-07-27 | Disposition: A | Discharge status: Home / Self Care | Payer: Medicare Other | Attending: Emergency Medicine | Admitting: Emergency Medicine

## 2012-07-27 ENCOUNTER — Emergency Department (HOSPITAL_COMMUNITY): Payer: Medicare Other

## 2012-07-27 ENCOUNTER — Encounter (HOSPITAL_COMMUNITY): Payer: Self-pay | Admitting: Emergency Medicine

## 2012-07-27 DIAGNOSIS — R109 Unspecified abdominal pain: Secondary | ICD-10-CM | POA: Insufficient documentation

## 2012-07-27 DIAGNOSIS — F329 Major depressive disorder, single episode, unspecified: Secondary | ICD-10-CM | POA: Insufficient documentation

## 2012-07-27 DIAGNOSIS — Z8669 Personal history of other diseases of the nervous system and sense organs: Secondary | ICD-10-CM | POA: Insufficient documentation

## 2012-07-27 DIAGNOSIS — Z862 Personal history of diseases of the blood and blood-forming organs and certain disorders involving the immune mechanism: Secondary | ICD-10-CM | POA: Insufficient documentation

## 2012-07-27 DIAGNOSIS — Z87898 Personal history of other specified conditions: Secondary | ICD-10-CM | POA: Insufficient documentation

## 2012-07-27 DIAGNOSIS — R079 Chest pain, unspecified: Secondary | ICD-10-CM | POA: Insufficient documentation

## 2012-07-27 DIAGNOSIS — F3289 Other specified depressive episodes: Secondary | ICD-10-CM | POA: Insufficient documentation

## 2012-07-27 DIAGNOSIS — E785 Hyperlipidemia, unspecified: Secondary | ICD-10-CM | POA: Insufficient documentation

## 2012-07-27 DIAGNOSIS — F411 Generalized anxiety disorder: Secondary | ICD-10-CM | POA: Insufficient documentation

## 2012-07-27 DIAGNOSIS — R0602 Shortness of breath: Secondary | ICD-10-CM | POA: Insufficient documentation

## 2012-07-27 DIAGNOSIS — Z79899 Other long term (current) drug therapy: Secondary | ICD-10-CM | POA: Insufficient documentation

## 2012-07-27 DIAGNOSIS — Z9889 Other specified postprocedural states: Secondary | ICD-10-CM | POA: Insufficient documentation

## 2012-07-27 DIAGNOSIS — Z87442 Personal history of urinary calculi: Secondary | ICD-10-CM | POA: Insufficient documentation

## 2012-07-27 DIAGNOSIS — E119 Type 2 diabetes mellitus without complications: Secondary | ICD-10-CM | POA: Insufficient documentation

## 2012-07-27 DIAGNOSIS — K219 Gastro-esophageal reflux disease without esophagitis: Secondary | ICD-10-CM | POA: Insufficient documentation

## 2012-07-27 DIAGNOSIS — I10 Essential (primary) hypertension: Secondary | ICD-10-CM | POA: Insufficient documentation

## 2012-07-27 LAB — CBC WITH DIFFERENTIAL/PLATELET
Eosinophils Relative: 0 % (ref 0–5)
HCT: 32.3 % — ABNORMAL LOW (ref 36.0–46.0)
Lymphocytes Relative: 20 % (ref 12–46)
Lymphs Abs: 1.6 10*3/uL (ref 0.7–4.0)
MCV: 87.5 fL (ref 78.0–100.0)
Platelets: 279 10*3/uL (ref 150–400)
RBC: 3.69 MIL/uL — ABNORMAL LOW (ref 3.87–5.11)
WBC: 8.2 10*3/uL (ref 4.0–10.5)

## 2012-07-27 LAB — URINALYSIS, ROUTINE W REFLEX MICROSCOPIC
Leukocytes, UA: NEGATIVE
Nitrite: NEGATIVE
Specific Gravity, Urine: 1.03 (ref 1.005–1.030)
pH: 6.5 (ref 5.0–8.0)

## 2012-07-27 LAB — BASIC METABOLIC PANEL
CO2: 26 mEq/L (ref 19–32)
Calcium: 9.6 mg/dL (ref 8.4–10.5)
Glucose, Bld: 122 mg/dL — ABNORMAL HIGH (ref 70–99)
Potassium: 5.2 mEq/L — ABNORMAL HIGH (ref 3.5–5.1)
Sodium: 142 mEq/L (ref 135–145)

## 2012-07-27 LAB — POCT I-STAT TROPONIN I

## 2012-07-27 MED ORDER — ALPRAZOLAM 0.5 MG PO TABS
0.5000 mg | ORAL_TABLET | Freq: Once | ORAL | Status: AC
  Administered 2012-07-27: 0.5 mg via ORAL
  Filled 2012-07-27: qty 1

## 2012-07-27 MED ORDER — ONDANSETRON HCL 4 MG PO TABS: 4.0000 mg | ORAL_TABLET | Freq: Four times a day (QID) | ORAL | Status: DC

## 2012-07-27 MED ORDER — HYOSCYAMINE SULFATE 0.125 MG SL SUBL: 0.1250 mg | SUBLINGUAL_TABLET | SUBLINGUAL | Status: DC | PRN

## 2012-07-27 NOTE — ED Notes (Signed)
Pt c/o intermittent 7/10 upper and lower abdomen pain. Denies cp. Pt has had this similar pain about a month ago. Hx of diverticulitis. Pt A&Ox4.

## 2012-07-27 NOTE — ED Provider Notes (Signed)
History     CSN: [id number]  Arrival date & time 07/27/12  1441   First MD Initiated Contact with Patient 07/27/12 1712      Chief Complaint  Patient presents with  . Chest Pain  . Abdominal Pain    (Consider location/radiation/quality/duration/timing/severity/associated sxs/prior treatment) Patient is a 78 y.o. female presenting with abdominal pain. The history is provided by the patient and a relative. No language interpreter was used.  Abdominal Pain The primary symptoms of the illness include abdominal pain and shortness of breath. The primary symptoms of the illness do not include fever, nausea, vomiting, diarrhea, dysuria, vaginal discharge or vaginal bleeding. The current episode started 3 to 5 hours ago. The onset of the illness was gradual. The problem has been gradually worsening.  Risk factors for an acute abdominal problem include being elderly and a history of abdominal surgery. Symptoms associated with the illness do not include chills, constipation, hematuria, frequency or back pain.  78 yo female c/o lower abdominal pain and epigastric pain that is chronic with intermittant SOB.  Multiple visits to pcp and GI for the same.  Last CT in 9/13 unremarkable for acute process.  Treated with antibiotics x 2 courses recently for possible diverticulitis despite no infection  on the CT scan with improvement of symptoms.  Goes to Dr. Arlyce Dice.  No acute distress.  No c/o chest pain.   Past Medical History  Diagnosis Date  . ANEMIA, DEFICIENCY, HX OF 03/01/2010  . DEPRESSIVE DISORDER 09/19/2010  . DM w/o Complication Type II 05/18/2007  . GERD 05/18/2007  . HYPERLIPIDEMIA 03/17/2008  . HYPERTENSION 05/18/2007  . INSOMNIA 11/23/2008  . Loss of weight 05/30/2010  . Kidney stone   . Glaucoma(365)   . Anxiety     Past Surgical History  Procedure Date  . Abdominal hysterectomy   . Cataract extraction   . Hemorrhoid surgery   . Appendectomy   . Pacemaker insertion 09/2011  . Dilation  and curettage of uterus     Family History  Problem Relation Age of Onset  . Brain cancer Father   . Colon cancer Sister   . Colitis Brother   . Heart attack Brother     History  Substance Use Topics  . Smoking status: Never Smoker   . Smokeless tobacco: Never Used  . Alcohol Use: No    OB History    Grav Para Term Preterm Abortions TAB SAB Ect Mult Living                  Review of Systems  Constitutional: Negative.  Negative for fever and chills.  HENT: Negative.   Eyes: Negative.   Respiratory: Positive for shortness of breath. Negative for cough and chest tightness.   Cardiovascular: Negative.  Negative for chest pain, palpitations and leg swelling.  Gastrointestinal: Positive for abdominal pain. Negative for nausea, vomiting, diarrhea, constipation, blood in stool and abdominal distention.  Genitourinary: Negative for dysuria, frequency, hematuria, vaginal bleeding and vaginal discharge.  Musculoskeletal: Negative for back pain.  Neurological: Negative.   Psychiatric/Behavioral: Negative.   All other systems reviewed and are negative.    Allergies  Amoxicillin and Penicillins  Home Medications   Current Outpatient Rx  Name  Route  Sig  Dispense  Refill  . ACETAMINOPHEN 500 MG PO TABS   Oral   Take 1,000 mg by mouth every 6 (six) hours as needed. For pain         . ALPRAZOLAM 0.5 MG PO  TABS   Oral   Take 0.5 mg by mouth 3 (three) times daily as needed. Agitation         . BISMUTH SUBSALICYLATE 262 MG/15ML PO SUSP   Oral   Take 15 mLs by mouth every 6 (six) hours as needed. Stomach         . FAMOTIDINE 20 MG PO TABS               . FUROSEMIDE 20 MG PO TABS   Oral   Take 20 mg by mouth at bedtime.          Marland Kitchen HYDROCODONE-ACETAMINOPHEN 5-500 MG PO TABS   Oral   Take 1 tablet by mouth every 8 (eight) hours as needed for pain.   60 tablet   1   . HYOSCYAMINE SULFATE 0.125 MG PO TABS   Oral   Take 1 tablet (0.125 mg total) by mouth  every 4 (four) hours as needed for cramping.   30 tablet   0   . HYOSCYAMINE SULFATE 0.125 MG SL SUBL   Sublingual   Place 1 tablet (0.125 mg total) under the tongue every 4 (four) hours as needed for cramping.   30 tablet   0   . LORAZEPAM 0.5 MG PO TABS   Oral   Take 0.5-1 mg by mouth 2 (two) times daily. Take 1 tablet in the morning, and 2 tablets in the evening.         Marland Kitchen METFORMIN HCL ER 500 MG PO TB24   Oral   Take 500 mg by mouth daily.          . ADULT MULTIVITAMIN W/MINERALS CH   Oral   Take 1 tablet by mouth daily.         Marland Kitchen ONDANSETRON HCL 4 MG PO TABS   Oral   Take 1 tablet (4 mg total) by mouth every 6 (six) hours.   12 tablet   0   . SERTRALINE HCL 25 MG PO TABS   Oral   Take 25 mg by mouth every morning.         Marland Kitchen SIMVASTATIN 80 MG PO TABS               . TRAMADOL HCL 50 MG PO TABS   Oral   Take 1 tablet (50 mg total) by mouth every 8 (eight) hours as needed for pain.   60 tablet   4     BP 174/74  Pulse 65  Temp 97 F (36.1 C) (Oral)  Resp 18  SpO2 100%  Physical Exam  Nursing note and vitals reviewed. Constitutional: She is oriented to person, place, and time. She appears well-developed and well-nourished.       HOH  HENT:  Head: Normocephalic and atraumatic.  Eyes: Conjunctivae normal and EOM are normal. Pupils are equal, round, and reactive to light.  Neck: Normal range of motion. Neck supple.  Cardiovascular: Normal rate.   Pulmonary/Chest: Effort normal and breath sounds normal. No respiratory distress. She has no wheezes. She has no rales.  Abdominal: Soft. She exhibits no distension. There is no tenderness.       No tenderness on exam  Musculoskeletal: Normal range of motion. She exhibits no edema and no tenderness.  Neurological: She is alert and oriented to person, place, and time. She has normal reflexes.  Skin: Skin is warm and dry.  Psychiatric: Her speech is normal and behavior is normal. Thought content normal.  Her  mood appears anxious. Cognition and memory are impaired. She exhibits abnormal recent memory.    ED Course  Procedures (including critical care time)   Upon reexamination pt c/o bilateral lower abdominal pain.   Dr Denton Lank in for evaluation.  C/o epigastric pain.     Labs Reviewed  CBC WITH DIFFERENTIAL - Abnormal; Notable for the following:    RBC 3.69 (*)     Hemoglobin 10.5 (*)     HCT 32.3 (*)     All other components within normal limits  BASIC METABOLIC PANEL - Abnormal; Notable for the following:    Potassium 5.2 (*)     Glucose, Bld 122 (*)     GFR calc non Af Amer 75 (*)     GFR calc Af Amer 87 (*)     All other components within normal limits  URINALYSIS, ROUTINE W REFLEX MICROSCOPIC - Abnormal; Notable for the following:    Ketones, ur 15 (*)     All other components within normal limits  POCT I-STAT TROPONIN I   Dg Chest 2 View  07/27/2012  *RADIOLOGY REPORT*  Clinical Data: Shortness of breath, weakness/anxiety  CHEST - 2 VIEW  Comparison: 10/01/2011  Findings: Lungs are clear.  No pleural effusion or pneumothorax.  Cardiomediastinal silhouette is within normal limits.  Left subclavian pacemaker.  Degenerative changes of the visualized thoracolumbar spine.  IMPRESSION: No evidence of acute cardiopulmonary disease.   Original Report Authenticated By: Charline Bills, M.D.    Dg Abd 2 Views  07/27/2012  *RADIOLOGY REPORT*  Clinical Data: Abdominal pain.  ABDOMEN - 2 VIEW  Comparison: Abdominal radiographs 06/16/2012.  Findings: Gas and stool are seen scattered throughout the colon extending to the level of the distal rectum.  No pathologic distension of small bowel is noted.  No gross evidence of pneumoperitoneum.  Numerous pelvic phleboliths are noted. Pacemaker leads are seen projecting over the lower thorax.  IMPRESSION: 1.  Nonobstructive bowel gas pattern. 2.  No pneumoperitoneum.   Original Report Authenticated By: Trudie Reed, M.D.      1. Abdominal  pain       MDM  78 yo with chronic migrating abdominal pain and intermitant SOB.  Chest x-ray and acute abdominal series unremarkable and reviewed by myself.  Potassium 5.2 will recheck this week.  Renal labs normal.  EKG unremarkable.  Patient will follow up with Dr. Arlyce Dice this week.  Family understands to return to ER for severe pain, n/v or fever.  Shared visit with Dr. Denton Lank.    Labs Reviewed  CBC WITH DIFFERENTIAL - Abnormal; Notable for the following:    RBC 3.69 (*)     Hemoglobin 10.5 (*)     HCT 32.3 (*)     All other components within normal limits  BASIC METABOLIC PANEL - Abnormal; Notable for the following:    Potassium 5.2 (*)     Glucose, Bld 122 (*)     GFR calc non Af Amer 75 (*)     GFR calc Af Amer 87 (*)     All other components within normal limits  URINALYSIS, ROUTINE W REFLEX MICROSCOPIC - Abnormal; Notable for the following:    Ketones, ur 15 (*)     All other components within normal limits  POCT I-STAT TROPONIN I  LAB REPORT - SCANNED     Date: 07/28/2012  Rate: 62   Rhythm: normal sinus rhythm  QRS Axis: left  Intervals: normal  ST/T Wave abnormalities: normal  Conduction  Disutrbances:none  Narrative Interpretation:   Old EKG Reviewed: unchanged     Remi Haggard, NP 07/28/12 1335

## 2012-07-27 NOTE — ED Notes (Signed)
Pt c/o epigastric pain with SOB x months that was worse today; pt denies nausea

## 2012-07-29 ENCOUNTER — Ambulatory Visit (INDEPENDENT_AMBULATORY_CARE_PROVIDER_SITE_OTHER): Payer: Medicare Other | Admitting: Family Medicine

## 2012-07-29 ENCOUNTER — Encounter: Payer: Self-pay | Admitting: Family Medicine

## 2012-07-29 VITALS — BP 146/70 | HR 68 | Temp 98.3°F | Wt 138.0 lb

## 2012-07-29 DIAGNOSIS — R1013 Epigastric pain: Secondary | ICD-10-CM

## 2012-07-29 MED ORDER — DEXLANSOPRAZOLE 60 MG PO CPDR: 60.0000 mg | DELAYED_RELEASE_CAPSULE | Freq: Every day | ORAL | Status: DC

## 2012-07-29 NOTE — Progress Notes (Signed)
  Subjective:    Patient ID: [name], female    DOB: July 04, 1923, 78 y.o.   MRN: [id number]  HPI Here with her son for chronic abdominal pain. She has had numerous visits to Dr. Amador Cunas, to Urgent Care, and to the ER for this in the past 5 months. She has been treated for presumed diverticulitis among other things. She was in the ED on 07-27-12 for generalized abdominal pain and no specific cause was found. Her labs were normal as was her exam. She has had numerous Xrays and a CT of the abdomen in the past few months, all of which were normal. Today she says she feels better than she did 2 days ago, but she stoll has lingering upper abdominal pains. She points to the epigastrium as the area most involved. No nausea or vomiting. BMs are normal. No fever. She had bee using Pepto-Bismol but it sounds like she has not used any type of acid blocking medication in quite awhile.    Review of Systems  Constitutional: Negative.   Respiratory: Negative.   Cardiovascular: Negative.   Gastrointestinal: Positive for abdominal pain. Negative for nausea, vomiting, diarrhea, constipation, blood in stool, abdominal distention, anal bleeding and rectal pain.  Genitourinary: Negative.        Objective:   Physical Exam  Constitutional: She appears well-developed and well-nourished. No distress.  Cardiovascular: Normal rate, regular rhythm, normal heart sounds and intact distal pulses.   Pulmonary/Chest: Effort normal and breath sounds normal.  Abdominal: Soft. Bowel sounds are normal. She exhibits no distension and no mass. There is no rebound and no guarding.       Mildly tender in the epigastrium           Assessment & Plan:  It sounds like she has gastritis or duodenitis, so I gave her samples for a few weeks of Dexilant to take daily. She will follow up with Dr. Langston Reusing.

## 2012-07-29 NOTE — ED Provider Notes (Signed)
Medical screening examination/treatment/procedure(s) were conducted as a shared visit with non-physician practitioner(s) and myself.  I personally evaluated the patient during the encounter Pt with hx chronic intermittent abd pain, c/o on and off abd cramping. abd soft nt. No puls mass. Prior workup/eval for same. No fevers.  No abd distension, no nv.   Suzi Roots, MD 07/29/12 [phone]

## 2012-08-03 ENCOUNTER — Observation Stay (HOSPITAL_COMMUNITY)
Admission: EM | Admit: 2012-08-03 | Discharge: 2012-08-04 | Disposition: A | Discharge status: Home / Self Care | Payer: Medicare Other | Attending: Family Medicine | Admitting: Family Medicine

## 2012-08-03 ENCOUNTER — Emergency Department (HOSPITAL_COMMUNITY): Payer: Medicare Other

## 2012-08-03 ENCOUNTER — Encounter (HOSPITAL_COMMUNITY): Payer: Self-pay | Admitting: Nurse Practitioner

## 2012-08-03 DIAGNOSIS — E119 Type 2 diabetes mellitus without complications: Secondary | ICD-10-CM

## 2012-08-03 DIAGNOSIS — M79606 Pain in leg, unspecified: Secondary | ICD-10-CM

## 2012-08-03 DIAGNOSIS — I10 Essential (primary) hypertension: Secondary | ICD-10-CM

## 2012-08-03 DIAGNOSIS — I4891 Unspecified atrial fibrillation: Secondary | ICD-10-CM

## 2012-08-03 DIAGNOSIS — E1149 Type 2 diabetes mellitus with other diabetic neurological complication: Secondary | ICD-10-CM | POA: Diagnosis present

## 2012-08-03 DIAGNOSIS — K219 Gastro-esophageal reflux disease without esophagitis: Secondary | ICD-10-CM | POA: Insufficient documentation

## 2012-08-03 DIAGNOSIS — Z862 Personal history of diseases of the blood and blood-forming organs and certain disorders involving the immune mechanism: Secondary | ICD-10-CM

## 2012-08-03 DIAGNOSIS — R079 Chest pain, unspecified: Principal | ICD-10-CM | POA: Insufficient documentation

## 2012-08-03 DIAGNOSIS — Z950 Presence of cardiac pacemaker: Secondary | ICD-10-CM

## 2012-08-03 DIAGNOSIS — R7989 Other specified abnormal findings of blood chemistry: Secondary | ICD-10-CM

## 2012-08-03 DIAGNOSIS — Z79899 Other long term (current) drug therapy: Secondary | ICD-10-CM | POA: Insufficient documentation

## 2012-08-03 DIAGNOSIS — G4700 Insomnia, unspecified: Secondary | ICD-10-CM

## 2012-08-03 DIAGNOSIS — R52 Pain, unspecified: Secondary | ICD-10-CM

## 2012-08-03 DIAGNOSIS — R634 Abnormal weight loss: Secondary | ICD-10-CM

## 2012-08-03 DIAGNOSIS — F329 Major depressive disorder, single episode, unspecified: Secondary | ICD-10-CM

## 2012-08-03 DIAGNOSIS — R001 Bradycardia, unspecified: Secondary | ICD-10-CM

## 2012-08-03 DIAGNOSIS — E785 Hyperlipidemia, unspecified: Secondary | ICD-10-CM | POA: Diagnosis present

## 2012-08-03 DIAGNOSIS — R0789 Other chest pain: Secondary | ICD-10-CM

## 2012-08-03 DIAGNOSIS — R1013 Epigastric pain: Secondary | ICD-10-CM

## 2012-08-03 DIAGNOSIS — R1032 Left lower quadrant pain: Secondary | ICD-10-CM

## 2012-08-03 DIAGNOSIS — E876 Hypokalemia: Secondary | ICD-10-CM

## 2012-08-03 LAB — CBC
HCT: 33.1 % — ABNORMAL LOW (ref 36.0–46.0)
Hemoglobin: 10.3 g/dL — ABNORMAL LOW (ref 12.0–15.0)
MCV: 86.6 fL (ref 78.0–100.0)
MCV: 86 fL (ref 78.0–100.0)
Platelets: 270 10*3/uL (ref 150–400)
RBC: 3.78 MIL/uL — ABNORMAL LOW (ref 3.87–5.11)
RDW: 14.1 % (ref 11.5–15.5)
WBC: 8.8 10*3/uL (ref 4.0–10.5)
WBC: 9.8 10*3/uL (ref 4.0–10.5)

## 2012-08-03 LAB — CREATININE, SERUM
Creatinine, Ser: 0.62 mg/dL (ref 0.50–1.10)
GFR calc Af Amer: >90 mL/min (ref >90)
GFR calc non Af Amer: 79 mL/min — ABNORMAL LOW (ref >90)

## 2012-08-03 LAB — BASIC METABOLIC PANEL
BUN: 12 mg/dL (ref 6–23)
Chloride: 102 mEq/L (ref 96–112)
Creatinine, Ser: 0.68 mg/dL (ref 0.50–1.10)
GFR calc Af Amer: 89 mL/min — ABNORMAL LOW (ref >90)
Glucose, Bld: 105 mg/dL — ABNORMAL HIGH (ref 70–99)

## 2012-08-03 LAB — GLUCOSE, CAPILLARY: Glucose-Capillary: 91 mg/dL (ref 70–99)

## 2012-08-03 LAB — MAGNESIUM: Magnesium: 1.8 mg/dL (ref 1.5–2.5)

## 2012-08-03 LAB — TROPONIN I: Troponin I: <0.3 ng/mL (ref <0.30)

## 2012-08-03 LAB — URINALYSIS, ROUTINE W REFLEX MICROSCOPIC
Leukocytes, UA: NEGATIVE
Nitrite: NEGATIVE
Specific Gravity, Urine: 1.008 (ref 1.005–1.030)
pH: 8 (ref 5.0–8.0)

## 2012-08-03 LAB — POCT I-STAT TROPONIN I

## 2012-08-03 MED ORDER — SODIUM CHLORIDE 0.9 % IJ SOLN
3.0000 mL | Freq: Two times a day (BID) | INTRAMUSCULAR | Status: DC
  Administered 2012-08-03: 3 mL via INTRAVENOUS

## 2012-08-03 MED ORDER — ONDANSETRON HCL 4 MG/2ML IJ SOLN
4.0000 mg | Freq: Four times a day (QID) | INTRAMUSCULAR | Status: DC | PRN
  Administered 2012-08-03: 4 mg via INTRAVENOUS
  Filled 2012-08-03: qty 2

## 2012-08-03 MED ORDER — HYDROCODONE-ACETAMINOPHEN 10-325 MG PO TABS: 1.0000 | ORAL_TABLET | Freq: Four times a day (QID) | ORAL | Status: DC | PRN

## 2012-08-03 MED ORDER — FUROSEMIDE 20 MG PO TABS
20.0000 mg | ORAL_TABLET | Freq: Every day | ORAL | Status: DC
  Administered 2012-08-03: 20 mg via ORAL
  Filled 2012-08-03 (×2): qty 1

## 2012-08-03 MED ORDER — FAMOTIDINE 20 MG PO TABS
20.0000 mg | ORAL_TABLET | Freq: Two times a day (BID) | ORAL | Status: DC
  Administered 2012-08-03: 20 mg via ORAL
  Filled 2012-08-03 (×3): qty 1

## 2012-08-03 MED ORDER — ALPRAZOLAM 0.5 MG PO TABS
0.5000 mg | ORAL_TABLET | Freq: Three times a day (TID) | ORAL | Status: DC | PRN
  Administered 2012-08-04: 0.5 mg via ORAL
  Filled 2012-08-03: qty 1

## 2012-08-03 MED ORDER — INSULIN ASPART 100 UNIT/ML ~~LOC~~ SOLN: 0.0000 [IU] | Freq: Every day | SUBCUTANEOUS | Status: DC

## 2012-08-03 MED ORDER — INSULIN ASPART 100 UNIT/ML ~~LOC~~ SOLN: 0.0000 [IU] | Freq: Three times a day (TID) | SUBCUTANEOUS | Status: DC

## 2012-08-03 MED ORDER — SERTRALINE HCL 25 MG PO TABS
25.0000 mg | ORAL_TABLET | Freq: Every morning | ORAL | Status: DC
  Administered 2012-08-04: 25 mg via ORAL
  Filled 2012-08-03: qty 1

## 2012-08-03 MED ORDER — NITROGLYCERIN 0.4 MG SL SUBL: 0.4000 mg | SUBLINGUAL_TABLET | SUBLINGUAL | Status: DC | PRN

## 2012-08-03 MED ORDER — BISACODYL 10 MG RE SUPP
10.0000 mg | Freq: Every day | RECTAL | Status: DC | PRN
  Filled 2012-08-03: qty 1

## 2012-08-03 MED ORDER — SODIUM CHLORIDE 0.9 % IJ SOLN: 3.0000 mL | INTRAMUSCULAR | Status: DC | PRN

## 2012-08-03 MED ORDER — HYDROCODONE-ACETAMINOPHEN 5-500 MG PO TABS: 1.0000 | ORAL_TABLET | Freq: Four times a day (QID) | ORAL | Status: DC | PRN

## 2012-08-03 MED ORDER — HYOSCYAMINE SULFATE 0.125 MG SL SUBL
0.1250 mg | SUBLINGUAL_TABLET | SUBLINGUAL | Status: DC | PRN
  Filled 2012-08-03: qty 1

## 2012-08-03 MED ORDER — HYDROCODONE-ACETAMINOPHEN 5-325 MG PO TABS
1.0000 | ORAL_TABLET | Freq: Four times a day (QID) | ORAL | Status: DC | PRN
  Administered 2012-08-04: 1 via ORAL
  Filled 2012-08-03: qty 1

## 2012-08-03 MED ORDER — ENOXAPARIN SODIUM 40 MG/0.4ML ~~LOC~~ SOLN
40.0000 mg | SUBCUTANEOUS | Status: DC
  Administered 2012-08-03: 40 mg via SUBCUTANEOUS
  Filled 2012-08-03 (×2): qty 0.4

## 2012-08-03 MED ORDER — HYDRALAZINE HCL 20 MG/ML IJ SOLN
5.0000 mg | Freq: Once | INTRAMUSCULAR | Status: AC
  Administered 2012-08-03: 5 mg via INTRAVENOUS
  Filled 2012-08-03 (×2): qty 0.25

## 2012-08-03 MED ORDER — ADULT MULTIVITAMIN W/MINERALS CH
1.0000 | ORAL_TABLET | Freq: Every day | ORAL | Status: DC
  Administered 2012-08-04: 1 via ORAL
  Filled 2012-08-03: qty 1

## 2012-08-03 MED ORDER — TRAMADOL HCL 50 MG PO TABS
50.0000 mg | ORAL_TABLET | Freq: Three times a day (TID) | ORAL | Status: DC | PRN
  Administered 2012-08-03: 50 mg via ORAL
  Filled 2012-08-03: qty 1

## 2012-08-03 MED ORDER — SODIUM CHLORIDE 0.9 % IV SOLN: 250.0000 mL | INTRAVENOUS | Status: DC | PRN

## 2012-08-03 MED ORDER — AMLODIPINE BESYLATE 5 MG PO TABS
5.0000 mg | ORAL_TABLET | Freq: Every day | ORAL | Status: DC
  Administered 2012-08-03 – 2012-08-04 (×2): 5 mg via ORAL
  Filled 2012-08-03 (×2): qty 1

## 2012-08-03 MED ORDER — ENALAPRILAT 1.25 MG/ML IV SOLN
1.2500 mg | INTRAVENOUS | Status: DC | PRN
  Administered 2012-08-03: 1.25 mg via INTRAVENOUS
  Filled 2012-08-03 (×2): qty 1

## 2012-08-03 MED ORDER — ONDANSETRON HCL 4 MG PO TABS: 4.0000 mg | ORAL_TABLET | Freq: Four times a day (QID) | ORAL | Status: DC | PRN

## 2012-08-03 MED ORDER — ASPIRIN EC 325 MG PO TBEC
325.0000 mg | DELAYED_RELEASE_TABLET | Freq: Once | ORAL | Status: AC
Start: 2012-08-03 — End: 2012-08-03
  Administered 2012-08-03: 325 mg via ORAL
  Filled 2012-08-03: qty 1

## 2012-08-03 MED ORDER — ASPIRIN EC 81 MG PO TBEC
81.0000 mg | DELAYED_RELEASE_TABLET | Freq: Every day | ORAL | Status: DC
  Administered 2012-08-04: 81 mg via ORAL
  Filled 2012-08-03: qty 1

## 2012-08-03 MED ORDER — ATORVASTATIN CALCIUM 40 MG PO TABS
40.0000 mg | ORAL_TABLET | Freq: Every day | ORAL | Status: DC
  Filled 2012-08-03: qty 1

## 2012-08-03 MED ORDER — ACETAMINOPHEN 325 MG PO TABS: 650.0000 mg | ORAL_TABLET | Freq: Four times a day (QID) | ORAL | Status: DC | PRN

## 2012-08-03 MED ORDER — PANTOPRAZOLE SODIUM 40 MG PO TBEC
40.0000 mg | DELAYED_RELEASE_TABLET | Freq: Two times a day (BID) | ORAL | Status: DC
  Administered 2012-08-04: 40 mg via ORAL
  Filled 2012-08-03: qty 1

## 2012-08-03 NOTE — ED Provider Notes (Signed)
History     CSN: [id number]  Arrival date & time 08/03/12  1235   First MD Initiated Contact with Patient 08/03/12 1645      Chief Complaint  Patient presents with  . Chest Pain    (Consider location/radiation/quality/duration/timing/severity/associated sxs/prior treatment) HPI SEKAI NAYAK is a 78 y.o. female who presents with complaint of epigastric abdominal and retrosternal chest pain, onset this morning around 11am. Pt states "i do not remember having this episode but my husband states I did." Per husband, pt started complaining of this pain, and became very upset. Pt has had similar pain to this before, she has seen her PCP and had been started on pepcid for it. She was told it is most likely acid reflux. Pt has not had any pain since then, but her husband brought her to ER for evaluation. Pt denies SOB, dizziness, weakness. No swelling in LE. No cough.    Past Medical History  Diagnosis Date  . ANEMIA, DEFICIENCY, HX OF 03/01/2010  . DEPRESSIVE DISORDER 09/19/2010  . DM w/o Complication Type II 05/18/2007  . GERD 05/18/2007  . HYPERLIPIDEMIA 03/17/2008  . HYPERTENSION 05/18/2007  . INSOMNIA 11/23/2008  . Loss of weight 05/30/2010  . Kidney stone   . Glaucoma(365)   . Anxiety     Past Surgical History  Procedure Date  . Abdominal hysterectomy   . Cataract extraction   . Hemorrhoid surgery   . Appendectomy   . Pacemaker insertion 09/2011  . Dilation and curettage of uterus   . Colonoscopy 06-28-10    per Dr. Arlyce Dice, diverticulosis only   . Esophagogastroduodenoscopy 05-13-11    per Dr. Arlyce Dice, normal     Family History  Problem Relation Age of Onset  . Brain cancer Father   . Colon cancer Sister   . Colitis Brother   . Heart attack Brother     History  Substance Use Topics  . Smoking status: Never Smoker   . Smokeless tobacco: Never Used  . Alcohol Use: No    OB History    Grav Para Term Preterm Abortions TAB SAB Ect Mult Living                   Review of Systems  Constitutional: Negative for fever and chills.  Cardiovascular: Positive for chest pain. Negative for palpitations and leg swelling.  Gastrointestinal: Positive for abdominal pain. Negative for nausea and vomiting.  Genitourinary: Negative for dysuria and flank pain.  All other systems reviewed and are negative.    Allergies  Amoxicillin and Penicillins  Home Medications   Current Outpatient Rx  Name  Route  Sig  Dispense  Refill  . ACETAMINOPHEN 500 MG PO TABS   Oral   Take 1,000 mg by mouth every 6 (six) hours as needed. For pain         . ALPRAZOLAM 0.5 MG PO TABS   Oral   Take 0.5 mg by mouth 3 (three) times daily as needed. Agitation         . DEXLANSOPRAZOLE 60 MG PO CPDR   Oral   Take 1 capsule (60 mg total) by mouth daily.   20 capsule   0   . FAMOTIDINE 20 MG PO TABS               . FUROSEMIDE 20 MG PO TABS   Oral   Take 20 mg by mouth at bedtime.          Marland Kitchen HYDROCODONE-ACETAMINOPHEN  5-500 MG PO TABS   Oral   Take 1 tablet by mouth every 8 (eight) hours as needed for pain.   60 tablet   1   . HYOSCYAMINE SULFATE 0.125 MG SL SUBL   Sublingual   Place 1 tablet (0.125 mg total) under the tongue every 4 (four) hours as needed for cramping.   30 tablet   0   . LORAZEPAM 0.5 MG PO TABS   Oral   Take 0.5-1 mg by mouth 2 (two) times daily. Take 1 tablet in the morning, and 2 tablets in the evening.         Marland Kitchen METFORMIN HCL ER 500 MG PO TB24   Oral   Take 500 mg by mouth daily.          . ADULT MULTIVITAMIN W/MINERALS CH   Oral   Take 1 tablet by mouth daily.         Marland Kitchen ONDANSETRON HCL 4 MG PO TABS   Oral   Take 1 tablet (4 mg total) by mouth every 6 (six) hours.   12 tablet   0   . SERTRALINE HCL 25 MG PO TABS   Oral   Take 25 mg by mouth every morning.         Marland Kitchen SIMVASTATIN 80 MG PO TABS   Oral   Take 40 mg by mouth at bedtime.          . TRAMADOL HCL 50 MG PO TABS   Oral   Take 1 tablet (50 mg  total) by mouth every 8 (eight) hours as needed for pain.   60 tablet   4     BP 148/63  Pulse 66  Temp 98.2 F (36.8 C) (Oral)  Resp 16  SpO2 99%  Physical Exam  Nursing note and vitals reviewed. Constitutional: She is oriented to person, place, and time. She appears well-developed and well-nourished. No distress.  HENT:  Head: Normocephalic.  Eyes: Conjunctivae normal are normal.  Neck: Neck supple.  Cardiovascular: Normal rate, regular rhythm and normal heart sounds.   Pulmonary/Chest: Effort normal. No respiratory distress. She has no wheezes. She has no rales.  Abdominal: Soft. Bowel sounds are normal. She exhibits no distension. There is no tenderness. There is no rebound.  Musculoskeletal: She exhibits no edema.  Neurological: She is alert and oriented to person, place, and time.  Skin: Skin is warm and dry.  Psychiatric:       Anxious appearing    ED Course  Procedures (including critical care time)   Date: 08/03/2012  Rate: 69  Rhythm: premature ventricular contractions (PVC) and atrial paced rhythm  QRS Axis: left  Intervals: normal  ST/T Wave abnormalities: normal  Conduction Disutrbances:right bundle branch block  Narrative Interpretation:   Old EKG Reviewed: changes noted new paced rhythm since 07/27/12, however it was paced on 02/13/12  Results for orders placed during the hospital encounter of 08/03/12  CBC      Component Value Range   WBC 8.8  4.0 - 10.5 K/uL   RBC 3.82 (*) 3.87 - 5.11 MIL/uL   Hemoglobin 10.3 (*) 12.0 - 15.0 g/dL   HCT 13.0 (*) 86.5 - 78.4 %   MCV 86.6  78.0 - 100.0 fL   MCH 27.0  26.0 - 34.0 pg   MCHC 31.1  30.0 - 36.0 g/dL   RDW 69.6  29.5 - 28.4 %   Platelets 299  150 - 400 K/uL  BASIC METABOLIC PANEL  Component Value Range   Sodium 140  135 - 145 mEq/L   Potassium 3.7  3.5 - 5.1 mEq/L   Chloride 102  96 - 112 mEq/L   CO2 26  19 - 32 mEq/L   Glucose, Bld 105 (*) 70 - 99 mg/dL   BUN 12  6 - 23 mg/dL   Creatinine,  Ser 0.98  0.50 - 1.10 mg/dL   Calcium 9.5  8.4 - 11.9 mg/dL   GFR calc non Af Amer 76 (*) >90 mL/min   GFR calc Af Amer 89 (*) >90 mL/min  POCT I-STAT TROPONIN I      Component Value Range   Troponin i, poc 0.01  0.00 - 0.08 ng/mL   Comment 3           POCT I-STAT TROPONIN I      Component Value Range   Troponin i, poc 0.01  0.00 - 0.08 ng/mL   Comment 3            Dg Chest 2 View  08/03/2012  *RADIOLOGY REPORT*  Clinical Data: Chest pain  CHEST - 2 VIEW  Comparison: 07/27/2012  Findings: Left chest wall pacer device is noted with lead in the right atrial appendage and right ventricle.  Heart size is normal.  No pleural effusion or edema.  No airspace consolidation is identified.  IMPRESSION:  1.  No active cardiopulmonary abnormalities.   Original Report Authenticated By: Signa Kell, M.D.    6:46 PM Pt still asymptomatic for CP. Aspirin given. Given pt's unknown to [place] CAD status, hx of tachybrady syndrome, has a pacemaker, multiple risk factors. Will admit for a rule out.   Spoke with a hospitalist who was concerned abuot pt's BP, and this being a hypertensive crisis. Pt's original BP was 146/70. Last one is 189/70. Pt curerntly asymptomatic. Will recheck  BP. Asked for suggestions on what she would like [place] to give her, to which the response was that she would come and evaluate pt.   Filed Vitals:   08/03/12 1238 08/03/12 1715 08/03/12 1745 08/03/12 1849  BP: 148/63 189/84 189/70 180/77  Pulse: 66 66 47   Temp: 98.2 F (36.8 C)   99.2 F (37.3 C)  TempSrc: Oral   Oral  Resp: 16 20 11 15   SpO2: 99% 100% 100% 100%      6:53 PM Pt's recheck BP is 180/77. Hospitalist at bedside.      MDM         Lottie Mussel, PA 08/05/12 [phone]

## 2012-08-03 NOTE — ED Notes (Signed)
PA at bedside.

## 2012-08-03 NOTE — ED Notes (Signed)
Pt states CP felt like "indigestion" pt points to epigastric region. Pt states eating breakfast this AM and had epigastric pain so she laid down.

## 2012-08-03 NOTE — ED Provider Notes (Signed)
And of chest pain anterior onset 11 AM today last approximately one hour resolved. Patient had similar episode last week seen by Dr. Daisey Must started on Pepcid which he took without relief hematochezia for mild indigestion patient is alert mildly aches appearing lungs clear auscultation heart regular rate and rhythm abdomen nontender nondistended.  Doug Sou, MD 08/03/12 1800

## 2012-08-03 NOTE — H&P (Signed)
[facility] History and Physical  [name]:[id number] DOB: Sep 19, 1922 DOA: 08/03/2012  Referring physician: Dr Ethelda Chick PCP: Rogelia Boga, MD  Specialists:   Chief Complaint: chest pain  HPI: [name] is a 78 y.o. female with h/o Trifascicular block with brady, PAF and s/p pacemaker, DM, HTN,  GERD, anxiety who presents with c/o chest pain. Her son is at the bedside assisting with the history. Pt states she does not remember having chest pain - but her son reports that at about 11:00am today she began having chest pain and that lasted till about 12:00 noon when he got her to the ED. At this point she then remembers that the pain was in her epigastrium(pointing there), and per her son she had similar pain about 1week ago and stated at that time that the pain wrapped around the L precordium below her breast then(but not this time) she denies N/V, diaphoresis, SOB, and no dizziness. She states the pain was nonexertional. She denies cough and no fevers. Per son her PCP was treating her with PPI for GERD. In the ED EKG showed Atrial-paced rhythm with Premature ventricular complexes, troponin neg, BP 189/70, HR47, and admission was requested for further eval and management.   Review of Systems: The patient denies anorexia, fever, weight loss, vision loss, decreased hearing, hoarseness, syncope, dyspnea on exertion, peripheral edema, balance deficits, hemoptysis, melena, hematochezia, severe, hematuria, incontinence, transient blindness, difficulty walking, depression, unusual weight change, abnormal bleeding.    Past Medical History  Diagnosis Date  . ANEMIA, DEFICIENCY, HX OF 03/01/2010  . DEPRESSIVE DISORDER 09/19/2010  . DM w/o Complication Type II 05/18/2007  . GERD 05/18/2007  . HYPERLIPIDEMIA 03/17/2008  . HYPERTENSION 05/18/2007  . INSOMNIA 11/23/2008  . Loss of weight 05/30/2010  . Kidney stone   . Glaucoma(365)   . Anxiety    Past Surgical History    Procedure Date  . Abdominal hysterectomy   . Cataract extraction   . Hemorrhoid surgery   . Appendectomy   . Pacemaker insertion 09/2011  . Dilation and curettage of uterus   . Colonoscopy 06-28-10    per Dr. Arlyce Dice, diverticulosis only   . Esophagogastroduodenoscopy 05-13-11    per Dr. Arlyce Dice, normal    Social History:  reports that she has never smoked. She has never used smokeless tobacco. She reports that she does not drink alcohol or use illicit drugs. where does patient live--home  Allergies  Allergen Reactions  . Amoxicillin Rash  . Penicillins Rash    Family History  Problem Relation Age of Onset  . Brain cancer Father   . Colon cancer Sister   . Colitis Brother   . Heart attack Brother     Prior to Admission medications   Medication Sig Start Date End Date Taking? Authorizing Provider  acetaminophen (TYLENOL) 500 MG tablet Take 1,000 mg by mouth every 6 (six) hours as needed. For pain   Yes Historical Provider, MD  ALPRAZolam Prudy Feeler) 0.5 MG tablet Take 0.5 mg by mouth 3 (three) times daily as needed. Agitation 05/08/12  Yes Historical Provider, MD  dexlansoprazole (DEXILANT) 60 MG capsule Take 1 capsule (60 mg total) by mouth daily. 07/29/12  Yes Nelwyn Salisbury, MD  famotidine (PEPCID) 20 MG tablet Take 20 mg by mouth 2 (two) times daily.  05/27/12  Yes Historical Provider, MD  furosemide (LASIX) 20 MG tablet Take 20 mg by mouth at bedtime.    Yes Historical Provider, MD  HYDROcodone-acetaminophen (VICODIN) 5-500 MG per  tablet Take 1 tablet by mouth every 8 (eight) hours as needed for pain. 07/13/12  Yes Gordy Savers, MD  hyoscyamine (LEVSIN/SL) 0.125 MG SL tablet Place 1 tablet (0.125 mg total) under the tongue every 4 (four) hours as needed for cramping. 07/27/12  Yes Remi Haggard, NP  LORazepam (ATIVAN) 0.5 MG tablet Take 0.5-1 mg by mouth 2 (two) times daily. Take 1 tablet in the morning, and 2 tablets in the evening. 05/14/12  Yes Gordy Savers, MD   metFORMIN (GLUCOPHAGE-XR) 500 MG 24 hr tablet Take 500 mg by mouth daily.  05/09/12  Yes Historical Provider, MD  Multiple Vitamin (MULTIVITAMIN WITH MINERALS) TABS Take 1 tablet by mouth daily.   Yes Historical Provider, MD  ondansetron (ZOFRAN) 4 MG tablet Take 1 tablet (4 mg total) by mouth every 6 (six) hours. 07/27/12  Yes Remi Haggard, NP  sertraline (ZOLOFT) 25 MG tablet Take 25 mg by mouth every morning. 06/19/12  Yes Gordy Savers, MD  simvastatin (ZOCOR) 80 MG tablet Take 40 mg by mouth at bedtime.  05/09/12  Yes Historical Provider, MD  traMADol (ULTRAM) 50 MG tablet Take 1 tablet (50 mg total) by mouth every 8 (eight) hours as needed for pain. 05/19/12  Yes Gordy Savers, MD   Physical Exam: Filed Vitals:   08/03/12 1715 08/03/12 1745 08/03/12 1849 08/03/12 1900  BP: 189/84 189/70 180/77 196/67  Pulse: 66 47  60  Temp:   99.2 F (37.3 C)   TempSrc:   Oral   Resp: 20 11 15 17   SpO2: 100% 100% 100% 100%   Constitutional: Vital signs reviewed.  Patient is a well-developed and well-nourished  in no acute distress and cooperative with exam. Alert and oriented x3.  Head: Normocephalic and atraumatic Mouth: no erythema or exudates, MMM Eyes: PERRL, EOMI, conjunctivae normal, No scleral icterus.  Neck: Supple, Trachea midline normal ROM, No JVD, mass, thyromegaly, or carotid bruit present.  Cardiovascular: paced rhythm, bradycardic, S1 normal, S2 normal, no MRG, pulses symmetric and intact bilaterally Pulmonary/Chest: CTAB, no wheezes, rales, or rhonchi Abdominal: Soft. Epigastric tenderness present, non-distended, bowel sounds are normal, no masses, organomegaly, or guarding present.  GU: no CVA tenderness Extremities: nocyanosis and no edema Neurological: A&O x3, Strength is normal and symmetric bilaterally, cranial nerve II-XII are grossly intact, no focal motor deficit, sensory intact to light touch bilaterally.  Skin: Warm, dry and intact. No rash.    Labs on  Admission:  Basic Metabolic Panel:  Lab 08/03/12 1610  NA 140  K 3.7  CL 102  CO2 26  GLUCOSE 105*  BUN 12  CREATININE 0.68  CALCIUM 9.5  MG --  PHOS --   Liver Function Tests: No results found for this basename: AST:5,ALT:5,ALKPHOS:5,BILITOT:5,PROT:5,ALBUMIN:5 in the last 168 hours No results found for this basename: LIPASE:5,AMYLASE:5 in the last 168 hours No results found for this basename: AMMONIA:5 in the last 168 hours CBC:  Lab 08/03/12 1258  WBC 8.8  NEUTROABS --  HGB 10.3*  HCT 33.1*  MCV 86.6  PLT 299   Cardiac Enzymes: No results found for this basename: CKTOTAL:5,CKMB:5,CKMBINDEX:5,TROPONINI:5 in the last 168 hours  BNP (last 3 results) No results found for this basename: PROBNP:3 in the last 8760 hours CBG: No results found for this basename: GLUCAP:5 in the last 168 hours  Radiological Exams on Admission: Dg Chest 2 View  08/03/2012  *RADIOLOGY REPORT*  Clinical Data: Chest pain  CHEST - 2 VIEW  Comparison: 07/27/2012  Findings:  Left chest wall pacer device is noted with lead in the right atrial appendage and right ventricle.  Heart size is normal.  No pleural effusion or edema.  No airspace consolidation is identified.  IMPRESSION:  1.  No active cardiopulmonary abnormalities.   Original Report Authenticated By: Signa Kell, M.D.       Assessment/Plan Active Problems:  Chest pain -As discussed above, cycle CEs to rule out MI - ASA, NTG PRN she is chest pain free at this time - BP control - will avoid BB at this time given her bradycardia - follow and consult Cards pending enzymes, she is followed by [facility] cards outpt  HYPERTENSION,uncontrolled/malignant -IV vasotec as needed this pm, Norvasc, avoiding BB as above  -possibly contributing to #1, monitor and further treat accordingly  h/o Trifascicular block with brady, PAF and s/p pacemaker -followed by Dr Ladona Ridgel, avoid BB as above, monitor on tele and consider cards consult to interrogate  pacer  DM w/o Complication Type II - accuchecks, SSI, hold metformin for now in anticipation of studies that may reqiure this.  HYPERLIPIDEMIA -continue outpt meds GERD -continue PPI    Code Status: full Family Communication: son at bedside  Disposition Plan: admit to tele for obs  Time spent: >26mins  Kela Millin [facility] Pager [phone]  If 7PM-7AM, please contact night-coverage [email or web address] Password [email or web address] 08/03/2012, 7:30 PM

## 2012-08-03 NOTE — ED Notes (Signed)
Son brought pt for eval of CP and headaches "For a while." pt reports this morning "The sharp pain in my chest got worse>' denies sob, nausea, lightheadedness. Pt has a pacemaker

## 2012-08-03 NOTE — ED Notes (Signed)
Pt states feeling same "indigestion" feeling for a few seconds under left breast. Pt. Currently denies pain. Pt. Told to press call bell if pain returns.

## 2012-08-03 NOTE — Progress Notes (Signed)
Pt had 6 beat run of V.tach.  Pt is asymptomatic.  Pt BP is 190/74 despite administration of vasotec in ED.  MD notified.  New orders received and carried out.  Will continue to monitor.

## 2012-08-04 DIAGNOSIS — E876 Hypokalemia: Secondary | ICD-10-CM

## 2012-08-04 LAB — TROPONIN I: Troponin I: <0.3 ng/mL (ref <0.30)

## 2012-08-04 LAB — BASIC METABOLIC PANEL
BUN: 12 mg/dL (ref 6–23)
CO2: 26 mEq/L (ref 19–32)
Chloride: 105 mEq/L (ref 96–112)
Creatinine, Ser: 0.71 mg/dL (ref 0.50–1.10)

## 2012-08-04 MED ORDER — MAGNESIUM SULFATE 40 MG/ML IJ SOLN
2.0000 g | Freq: Once | INTRAMUSCULAR | Status: AC
  Administered 2012-08-04: 2 g via INTRAVENOUS
  Filled 2012-08-04: qty 50

## 2012-08-04 MED ORDER — ALUM & MAG HYDROXIDE-SIMETH 200-200-20 MG/5ML PO SUSP: 30.0000 mL | ORAL | Status: DC | PRN

## 2012-08-04 MED ORDER — POTASSIUM CHLORIDE CRYS ER 20 MEQ PO TBCR
40.0000 meq | EXTENDED_RELEASE_TABLET | Freq: Once | ORAL | Status: AC
  Administered 2012-08-04: 40 meq via ORAL
  Filled 2012-08-04: qty 2

## 2012-08-04 MED ORDER — POTASSIUM CHLORIDE CRYS ER 20 MEQ PO TBCR: 40.0000 meq | EXTENDED_RELEASE_TABLET | Freq: Once | ORAL | Status: DC

## 2012-08-04 MED ORDER — GI COCKTAIL ~~LOC~~
30.0000 mL | Freq: Once | ORAL | Status: AC
  Administered 2012-08-04: 30 mL via ORAL
  Filled 2012-08-04: qty 30

## 2012-08-04 NOTE — Discharge Summary (Signed)
Physician Discharge Summary  [name] HQI:[id number] DOB: Sep 28, 1922 DOA: 08/03/2012  PCP: Rogelia Boga, MD  Admit date: 08/03/2012 Discharge date: 08/04/2012  Time spent: > 35 minutes  Recommendations for Outpatient Follow-up:  1. Please be sure to follow up on Potassium levels 2. Also follow up with anemia. Which was steady at 10.3 during hospitalization  Discharge Diagnoses:  Active Problems:  DM w/o Complication Type II  HYPERLIPIDEMIA  HYPERTENSION  GERD  Chest pain  HTN (hypertension)   Discharge Condition: stable  Diet recommendation: Low sodium heart healthy  Filed Weights   08/04/12 0647  Weight: 59.92 kg (132 lb 1.6 oz)    History of present illness:  From original HPI: [name] is a 78 y.o. female with h/o Trifascicular block with brady, PAF and s/p pacemaker, DM, HTN, GERD, anxiety who presents with c/o chest pain. Her son is at the bedside assisting with the history. [name] states she does not remember having chest pain - but her son reports that at about 11:00am today she began having chest pain and that lasted till about 12:00 noon when he got her to the ED. At this point she then remembers that the pain was in her epigastrium(pointing there), and per her son she had similar pain about 1week ago and stated at that time that the pain wrapped around the L precordium below her breast then(but not this time) she denies N/V, diaphoresis, SOB, and no dizziness. She states the pain was nonexertional. She denies cough and no fevers. Per son her PCP was treating her with PPI for GERD. In the ED EKG showed Atrial-paced rhythm with Premature ventricular complexes, troponin neg, BP 189/70, HR47, and admission was requested for further eval and management.  Hospital Course:  1) Chest pain - Based on history patient reportedly complained of discomfort that originated at epigastric area.  She is currently on dexlansoprazole and pepcid.  No chest pain reported  while hospitalized.  Reportedly had NSVT but on review patient had artifact and rhythm was paced and not felt to be real VT.   - Cardiac enzymes negative x 3 - Chest x ray showed no active cardiopulmonary abnormalities - At this point patient asymptomatic requesting discharge - Most likely due to Reflux at this juncture.  Will recommend patient follow up with her primary care physician.  2)  HTN - Well controlled on last check at 136/85.  Will plan on continuing home regimen.  3) GERD - likely causing # 1 will recommend lifestyle modifications and continuing home regimen  4) DM - Continue home regimen and recommend diabetic diet  5) HPL - Stable continue atorvastatin.  6) Hypokalemia - Recommend kdur 40 meq po x 1 prior to discharge.   Procedures:  CE's x 3  EKG  Consultations:  Discussed case with [facility] who interrgated pacemaker and did not find any problems with the pacemaker.  Discharge Exam: Filed Vitals:   08/03/12 2330 08/04/12 0430 08/04/12 0647 08/04/12 1016  BP: 130/56 115/52  136/85  Pulse:  60  68  Temp:  97.9 F (36.6 C)    TempSrc:  Oral    Resp:  18    Height:   5\' 3"  (1.6 m)   Weight:   59.92 kg (132 lb 1.6 oz)   SpO2:  97%      General: [name] in NAD, Alert and Awake Cardiovascular: S1 ans S2 normal, no murmurs or rubs Respiratory: CTA BL, no wheezes, SOB Abdomen: soft, NT, ND  Discharge Instructions  Discharge Orders    Future Appointments: Provider: Department: Dept Phone: Center:   10/12/2012 1:45 PM Gordy Savers, MD [facility] [place] [phone] [facility]     Future Orders Please Complete By Expires   Diet - low sodium heart healthy      Increase activity slowly      Discharge instructions      Comments:   Please be sure to follow up with your primary care physician in 1-2 weeks or sooner should you have any new concerns.  You will need to have your potassium rechecked on that visit.   Call MD for:   persistant nausea and vomiting      Call MD for:  temperature >100.4      Call MD for:  difficulty breathing, headache or visual disturbances      Call MD for:  persistant dizziness or light-headedness          Medication List     As of 08/04/2012  1:36 PM    TAKE these medications         acetaminophen 500 MG tablet   Commonly known as: TYLENOL   Take 1,000 mg by mouth every 6 (six) hours as needed. For pain      ALPRAZolam 0.5 MG tablet   Commonly known as: XANAX   Take 0.5 mg by mouth 3 (three) times daily as needed. Agitation      dexlansoprazole 60 MG capsule   Commonly known as: DEXILANT   Take 1 capsule (60 mg total) by mouth daily.      famotidine 20 MG tablet   Commonly known as: PEPCID   Take 20 mg by mouth 2 (two) times daily.      furosemide 20 MG tablet   Commonly known as: LASIX   Take 20 mg by mouth at bedtime.      HYDROcodone-acetaminophen 5-500 MG per tablet   Commonly known as: VICODIN   Take 1 tablet by mouth every 8 (eight) hours as needed for pain.      hyoscyamine 0.125 MG SL tablet   Commonly known as: LEVSIN SL   Place 1 tablet (0.125 mg total) under the tongue every 4 (four) hours as needed for cramping.      LORazepam 0.5 MG tablet   Commonly known as: ATIVAN   Take 0.5-1 mg by mouth 2 (two) times daily. Take 1 tablet in the morning, and 2 tablets in the evening.      metFORMIN 500 MG 24 hr tablet   Commonly known as: GLUCOPHAGE-XR   Take 500 mg by mouth daily.      multivitamin with minerals Tabs   Take 1 tablet by mouth daily.      ondansetron 4 MG tablet   Commonly known as: ZOFRAN   Take 1 tablet (4 mg total) by mouth every 6 (six) hours.      sertraline 25 MG tablet   Commonly known as: ZOLOFT   Take 25 mg by mouth every morning.      simvastatin 80 MG tablet   Commonly known as: ZOCOR   Take 40 mg by mouth at bedtime.      traMADol 50 MG tablet   Commonly known as: ULTRAM   Take 1 tablet (50 mg total) by mouth every 8  (eight) hours as needed for pain.          The results of significant diagnostics from this hospitalization (including imaging, microbiology, ancillary and  laboratory) are listed below for reference.    Significant Diagnostic Studies: Dg Chest 2 View  08/03/2012  *RADIOLOGY REPORT*  Clinical Data: Chest pain  CHEST - 2 VIEW  Comparison: 07/27/2012  Findings: Left chest wall pacer device is noted with lead in the right atrial appendage and right ventricle.  Heart size is normal.  No pleural effusion or edema.  No airspace consolidation is identified.  IMPRESSION:  1.  No active cardiopulmonary abnormalities.   Original Report Authenticated By: Signa Kell, M.D.    Dg Chest 2 View  07/27/2012  *RADIOLOGY REPORT*  Clinical Data: Shortness of breath, weakness/anxiety  CHEST - 2 VIEW  Comparison: 10/01/2011  Findings: Lungs are clear.  No pleural effusion or pneumothorax.  Cardiomediastinal silhouette is within normal limits.  Left subclavian pacemaker.  Degenerative changes of the visualized thoracolumbar spine.  IMPRESSION: No evidence of acute cardiopulmonary disease.   Original Report Authenticated By: Charline Bills, M.D.    Dg Abd 2 Views  07/27/2012  *RADIOLOGY REPORT*  Clinical Data: Abdominal pain.  ABDOMEN - 2 VIEW  Comparison: Abdominal radiographs 06/16/2012.  Findings: Gas and stool are seen scattered throughout the colon extending to the level of the distal rectum.  No pathologic distension of small bowel is noted.  No gross evidence of pneumoperitoneum.  Numerous pelvic phleboliths are noted. Pacemaker leads are seen projecting over the lower thorax.  IMPRESSION: 1.  Nonobstructive bowel gas pattern. 2.  No pneumoperitoneum.   Original Report Authenticated By: Trudie Reed, M.D.     Microbiology: No results found for this or any previous visit (from the past 240 hour(s)).   Labs: Basic Metabolic Panel:  Lab 08/04/12 4098 08/03/12 2143 08/03/12 1258  NA 142 -- 140  K  3.3* -- 3.7  CL 105 -- 102  CO2 26 -- 26  GLUCOSE 98 -- 105*  BUN 12 -- 12  CREATININE 0.71 0.62 0.68  CALCIUM 8.9 -- 9.5  MG -- 1.8 --  PHOS -- -- --   Liver Function Tests: No results found for this basename: AST:5,ALT:5,ALKPHOS:5,BILITOT:5,PROT:5,ALBUMIN:5 in the last 168 hours No results found for this basename: LIPASE:5,AMYLASE:5 in the last 168 hours No results found for this basename: AMMONIA:5 in the last 168 hours CBC:  Lab 08/03/12 2143 08/03/12 1258  WBC 9.8 8.8  NEUTROABS -- --  HGB 10.3* 10.3*  HCT 32.5* 33.1*  MCV 86.0 86.6  PLT 270 299   Cardiac Enzymes:  Lab 08/04/12 1022 08/04/12 0338 08/03/12 2144  CKTOTAL -- -- --  CKMB -- -- --  CKMBINDEX -- -- --  TROPONINI <0.30 <0.30 <0.30   BNP: BNP (last 3 results) No results found for this basename: PROBNP:3 in the last 8760 hours CBG:  Lab 08/04/12 1138 08/04/12 0554 08/03/12 2155  GLUCAP 101* 100* 91       Signed:  Penny Pia  [facility] 08/04/2012, 1:36 PM

## 2012-08-04 NOTE — Progress Notes (Signed)
Pt complaining of abdominal pain 4/10 despite administration of zofran and tramadol.  MD notified.  New orders received.  Will continue to monitor.

## 2012-08-06 NOTE — ED Provider Notes (Signed)
Medical screening examination/treatment/procedure(s) were conducted as a shared visit with non-physician practitioner(s) and myself.  I personally evaluated the patient during the encounter  Doug Sou, MD 08/06/12 [phone]

## 2012-08-10 ENCOUNTER — Ambulatory Visit: Payer: Medicare Other | Admitting: Internal Medicine

## 2012-08-11 ENCOUNTER — Telehealth: Payer: Self-pay | Admitting: Internal Medicine

## 2012-08-11 NOTE — Telephone Encounter (Signed)
Pt sent to ED. Encounter closed.

## 2012-08-11 NOTE — Telephone Encounter (Signed)
Patient Information:  Caller Name: [name]  Phone: [phone]  Patient: [name], [name]  Gender: Female  DOB: March 08, 1923  Age: 78 Years  PCP: Eleonore Chiquito [facility])   Symptoms  Reason For Call & Symptoms: Emergent Call: Confusion and severe, intermittent chest pain under left breast.  Nausea present. No arm pain or sweating.  Reviewed Health History In EMR: Yes  Reviewed Medications In EMR: Yes  Reviewed Allergies In EMR: Yes  Reviewed Surgeries / Procedures: Yes  Date of Onset of Symptoms: 08/11/2012  Treatments Tried: pepto bismol  Treatments Tried Worked: No  Guideline(s) Used:  Chest Pain  Disposition Per Guideline:   Call EMS 911 Now  Reason For Disposition Reached:   Chest pain lasting longer than 5 minutes and ANY of the following:  Over 76 years old Over 33 years old and at least one cardiac risk factor (i.e., high blood pressure, diabetes, high cholesterol, obesity, smoker or strong family history of heart disease) Pain is crushing, pressure-like, or heavy  Took nitroglycerin and chest pain was not relieved History of heart disease (i.e., angina, heart attack, bypass surgery, angioplasty, CHF)  Advice Given:  N/A  Office Follow Up:  Does the office need to follow up with this patient?: No  Instructions For The Office: N/A  RN Note:  Diabetic, not testing blood sugar.  BP 151/68.  Reports recurrent episodes of severe chest pain causing anxiety. Agreed to call 911.

## 2012-08-13 ENCOUNTER — Telehealth: Payer: Self-pay | Admitting: Internal Medicine

## 2012-08-13 NOTE — Telephone Encounter (Signed)
[name], pt son's would like to discuss pt with you. They are coming in Monday, Dec 16. He is concerned about her memory issues, she gets up "sick" everyday. He would like to talk w/ you if you will have a few min. (He has power of attorney.) [phone].

## 2012-08-14 NOTE — Telephone Encounter (Signed)
Spoke to [name] has concerns about pt's memory and nausea. Told him can discuss on Monday with Dr. Amador Cunas. [name] verbalized understanding.

## 2012-08-16 ENCOUNTER — Other Ambulatory Visit: Payer: Self-pay | Admitting: Internal Medicine

## 2012-08-17 ENCOUNTER — Encounter: Payer: Self-pay | Admitting: Internal Medicine

## 2012-08-17 ENCOUNTER — Ambulatory Visit (INDEPENDENT_AMBULATORY_CARE_PROVIDER_SITE_OTHER): Payer: Medicare Other | Admitting: Internal Medicine

## 2012-08-17 VITALS — BP 142/80 | HR 69 | Temp 97.9°F | Resp 18 | Wt 135.0 lb

## 2012-08-17 DIAGNOSIS — E119 Type 2 diabetes mellitus without complications: Secondary | ICD-10-CM

## 2012-08-17 DIAGNOSIS — E876 Hypokalemia: Secondary | ICD-10-CM

## 2012-08-17 DIAGNOSIS — R7989 Other specified abnormal findings of blood chemistry: Secondary | ICD-10-CM

## 2012-08-17 DIAGNOSIS — R0789 Other chest pain: Secondary | ICD-10-CM

## 2012-08-17 DIAGNOSIS — K219 Gastro-esophageal reflux disease without esophagitis: Secondary | ICD-10-CM

## 2012-08-17 DIAGNOSIS — I10 Essential (primary) hypertension: Secondary | ICD-10-CM

## 2012-08-17 LAB — RENAL FUNCTION PANEL
Albumin: 3.9 g/dL (ref 3.5–5.2)
BUN: 13 mg/dL (ref 6–23)
CO2: 29 mEq/L (ref 19–32)
Chloride: 102 mEq/L (ref 96–112)
Potassium: 4.9 mEq/L (ref 3.5–5.1)

## 2012-08-17 NOTE — Patient Instructions (Signed)
Limit your sodium (Salt) intake  Avoids foods high in acid such as tomatoes citrus juices, and spicy foods.  Avoid eating within two hours of lying down or before exercising.  Do not overheat.  Try smaller more frequent meals.  If symptoms persist, elevate the head of her bed 12 inches while sleeping.  Return in 4 months for follow-up

## 2012-08-17 NOTE — Progress Notes (Signed)
Subjective:    Patient ID: [name], female    DOB: September 30, 1922, 78 y.o.   MRN: [id number]  HPI  78 year old patient who is seen today in followup. She's had a refill of recent hospitalization and was discharged approximately 2 weeks ago. She was admitted for atypical chest pain. This is felt related to GI origin. She has treated type 2 diabetes on metformin therapy. She has a history of anxiety disorder. She has done well since her hospital discharge. There was some concern about serum potassium. Only complaints today are low back pain that has been fairly chronic. Medical regimen includes both tramadol and Vicodin.   Past Medical History  Diagnosis Date  . ANEMIA, DEFICIENCY, HX OF 03/01/2010  . DEPRESSIVE DISORDER 09/19/2010  . DM w/o Complication Type II 05/18/2007  . GERD 05/18/2007  . HYPERLIPIDEMIA 03/17/2008  . HYPERTENSION 05/18/2007  . INSOMNIA 11/23/2008  . Loss of weight 05/30/2010  . Kidney stone   . Glaucoma(365)   . Anxiety     History   Social History  . Marital Status: Widowed    Spouse Name: N/A    Number of Children: 3  . Years of Education: N/A   Occupational History  . retired    Social History Main Topics  . Smoking status: Never Smoker   . Smokeless tobacco: Never Used  . Alcohol Use: No  . Drug Use: No  . Sexually Active: Not on file   Other Topics Concern  . Not on file   Social History Narrative  . No narrative on file    Past Surgical History  Procedure Date  . Abdominal hysterectomy   . Cataract extraction   . Hemorrhoid surgery   . Appendectomy   . Pacemaker insertion 09/2011  . Dilation and curettage of uterus   . Colonoscopy 06-28-10    per Dr. Arlyce Dice, diverticulosis only   . Esophagogastroduodenoscopy 05-13-11    per Dr. Arlyce Dice, normal     Family History  Problem Relation Age of Onset  . Brain cancer Father   . Colon cancer Sister   . Colitis Brother   . Heart attack Brother     Allergies  Allergen Reactions  .  Amoxicillin Rash  . Penicillins Rash    Current Outpatient Prescriptions on File Prior to Visit  Medication Sig Dispense Refill  . acetaminophen (TYLENOL) 500 MG tablet Take 1,000 mg by mouth every 6 (six) hours as needed. For pain      . ALPRAZolam (XANAX) 0.5 MG tablet Take 0.5 mg by mouth 3 (three) times daily as needed. Agitation      . dexlansoprazole (DEXILANT) 60 MG capsule Take 1 capsule (60 mg total) by mouth daily.  20 capsule  0  . famotidine (PEPCID) 20 MG tablet Take 20 mg by mouth 2 (two) times daily.       . furosemide (LASIX) 20 MG tablet Take 20 mg by mouth at bedtime.       Marland Kitchen HYDROcodone-acetaminophen (VICODIN) 5-500 MG per tablet Take 1 tablet by mouth every 8 (eight) hours as needed for pain.  60 tablet  1  . hyoscyamine (LEVSIN/SL) 0.125 MG SL tablet Place 1 tablet (0.125 mg total) under the tongue every 4 (four) hours as needed for cramping.  30 tablet  0  . LORazepam (ATIVAN) 0.5 MG tablet Take 0.5-1 mg by mouth 2 (two) times daily. Take 1 tablet in the morning, and 2 tablets in the evening.      Marland Kitchen  metFORMIN (GLUCOPHAGE-XR) 500 MG 24 hr tablet Take 500 mg by mouth daily.       . Multiple Vitamin (MULTIVITAMIN WITH MINERALS) TABS Take 1 tablet by mouth daily.      . ondansetron (ZOFRAN) 4 MG tablet Take 1 tablet (4 mg total) by mouth every 6 (six) hours.  12 tablet  0  . sertraline (ZOLOFT) 25 MG tablet Take 25 mg by mouth every morning.      . simvastatin (ZOCOR) 80 MG tablet Take 40 mg by mouth at bedtime.       . traMADol (ULTRAM) 50 MG tablet Take 1 tablet (50 mg total) by mouth every 8 (eight) hours as needed for pain.  60 tablet  4    BP 142/80      Review of Systems  Constitutional: Positive for unexpected weight change. Negative for fever, appetite change and fatigue.  HENT: Negative for hearing loss, ear pain, nosebleeds, congestion, sore throat, mouth sores, trouble swallowing, neck stiffness, dental problem, voice change, sinus pressure and tinnitus.    Eyes: Negative for photophobia, pain, redness and visual disturbance.  Respiratory: Negative for cough, chest tightness and shortness of breath.   Cardiovascular: Negative for chest pain, palpitations and leg swelling.  Gastrointestinal: Negative for nausea, vomiting, abdominal pain, diarrhea, constipation, blood in stool, abdominal distention and rectal pain.  Genitourinary: Negative for dysuria, urgency, frequency, hematuria, flank pain, vaginal bleeding, vaginal discharge, difficulty urinating, genital sores, vaginal pain, menstrual problem and pelvic pain.  Musculoskeletal: Negative for back pain and arthralgias.  Skin: Negative for rash.  Neurological: Negative for dizziness, syncope, speech difficulty, weakness, light-headedness, numbness and headaches.  Hematological: Negative for adenopathy. Does not bruise/bleed easily.  Psychiatric/Behavioral: Negative for suicidal ideas, behavioral problems, self-injury, dysphoric mood and agitation. The patient is not nervous/anxious.        Objective:   Physical Exam  Constitutional: She is oriented to person, place, and time. She appears well-developed and well-nourished.  HENT:  Head: Normocephalic and atraumatic.  Right Ear: External ear normal.  Left Ear: External ear normal.  Mouth/Throat: Oropharynx is clear and moist.  Eyes: Conjunctivae normal and EOM are normal.  Neck: Normal range of motion. Neck supple. No JVD present. No thyromegaly present.  Cardiovascular: Normal rate, regular rhythm, normal heart sounds and intact distal pulses.   No murmur heard. Pulmonary/Chest: Effort normal and breath sounds normal. She has no wheezes. She has no rales.  Abdominal: Soft. Bowel sounds are normal. She exhibits no distension and no mass. There is no tenderness. There is no rebound and no guarding.  Musculoskeletal: Normal range of motion. She exhibits no edema and no tenderness.  Neurological: She is alert and oriented to person, place, and  time. She has normal reflexes. No cranial nerve deficit. She exhibits normal muscle tone. Coordination normal.  Skin: Skin is warm and dry. No rash noted.  Psychiatric: She has a normal mood and affect. Her behavior is normal.          Assessment & Plan:    Hypertension. Repeat blood pressure 130/80  Anxiety disorder  Gastroesophageal reflux disease   No change medication  Recheck 4 months or as needed  We'll check a electrolytes panel

## 2012-08-17 NOTE — Addendum Note (Signed)
Addended by: Rita Ohara R on: 08/17/2012 03:31 PM   Modules accepted: Orders

## 2012-08-18 ENCOUNTER — Other Ambulatory Visit: Payer: Self-pay | Admitting: Internal Medicine

## 2012-08-24 ENCOUNTER — Telehealth: Payer: Self-pay | Admitting: Internal Medicine

## 2012-08-24 NOTE — Telephone Encounter (Signed)
Spoke with son, patient's alarm clock was going off and they thought it was Latitude box.  Per [employer], patient is monitored with no recent alarms.  Son reassured.  Will follow up as scheduled.

## 2012-08-24 NOTE — Telephone Encounter (Signed)
Plz return call to pt son [name] [phone] regarding remote transmission issues.

## 2012-08-25 ENCOUNTER — Telehealth: Payer: Self-pay | Admitting: Internal Medicine

## 2012-08-25 NOTE — Telephone Encounter (Signed)
[name] called in about patient [name].  Attempted to call back. Phone rang and rang/ No voicemail. No answer.

## 2012-08-27 ENCOUNTER — Telehealth: Payer: Self-pay | Admitting: Internal Medicine

## 2012-08-27 NOTE — Telephone Encounter (Signed)
[facility] Triage Call Report Triage Record Num: [id number] Operator: Thayer Headings Patient Name: [name] Call Date & Time: 08/25/2012 2:39:09PM Patient Phone: [phone] PCP: Gordy Savers Patient Gender: Female PCP Fax : [phone] Patient DOB: 1923/08/20 [facility] Name: [facility]  Reason for Call: Caller: [name]/Other; PCP: Eleonore Chiquito (Family Practice); CB#: [phone]; Calling today 08/25/12 regarding mother had degenerative disc disease and takes Hydrocodone for pain but is not helping. Wants to know if MD will increase her nerve medication to see if that will help calm her down. [facility] advised caller that MD will not increase her nerve medication after hours over the phone and will not call in narcotics after hours. Offerred triage assessment of symptoms. Caller declined said he will wait until Thursday and call office back to speak with MD to get recommendations.

## 2012-08-27 NOTE — Telephone Encounter (Signed)
Okay to increase alprazolam to every 4-6 hours as needed for extreme anxiety

## 2012-08-27 NOTE — Telephone Encounter (Signed)
Spoke to pt's son [name], told him okay to increase Alprazolam to every 4-6 hours as needed. [name] stated pt is taking Lorazepam not Alprazolam. Told him okay try to increase that every 4-6 hours and see if that helps. [name] verbalized understanding.

## 2012-08-29 ENCOUNTER — Encounter (HOSPITAL_COMMUNITY): Payer: Self-pay | Admitting: *Deleted

## 2012-08-29 ENCOUNTER — Emergency Department (HOSPITAL_COMMUNITY)
Admission: EM | Admit: 2012-08-29 | Discharge: 2012-08-29 | Disposition: A | Discharge status: Home / Self Care | Payer: Medicare Other | Attending: Emergency Medicine | Admitting: Emergency Medicine

## 2012-08-29 DIAGNOSIS — F329 Major depressive disorder, single episode, unspecified: Secondary | ICD-10-CM | POA: Insufficient documentation

## 2012-08-29 DIAGNOSIS — Z79899 Other long term (current) drug therapy: Secondary | ICD-10-CM | POA: Insufficient documentation

## 2012-08-29 DIAGNOSIS — R079 Chest pain, unspecified: Secondary | ICD-10-CM | POA: Insufficient documentation

## 2012-08-29 DIAGNOSIS — Z862 Personal history of diseases of the blood and blood-forming organs and certain disorders involving the immune mechanism: Secondary | ICD-10-CM | POA: Insufficient documentation

## 2012-08-29 DIAGNOSIS — I10 Essential (primary) hypertension: Secondary | ICD-10-CM | POA: Insufficient documentation

## 2012-08-29 DIAGNOSIS — F3289 Other specified depressive episodes: Secondary | ICD-10-CM | POA: Insufficient documentation

## 2012-08-29 DIAGNOSIS — K5900 Constipation, unspecified: Secondary | ICD-10-CM | POA: Insufficient documentation

## 2012-08-29 DIAGNOSIS — Z8669 Personal history of other diseases of the nervous system and sense organs: Secondary | ICD-10-CM | POA: Insufficient documentation

## 2012-08-29 DIAGNOSIS — E785 Hyperlipidemia, unspecified: Secondary | ICD-10-CM | POA: Insufficient documentation

## 2012-08-29 DIAGNOSIS — Z8639 Personal history of other endocrine, nutritional and metabolic disease: Secondary | ICD-10-CM | POA: Insufficient documentation

## 2012-08-29 DIAGNOSIS — E119 Type 2 diabetes mellitus without complications: Secondary | ICD-10-CM | POA: Insufficient documentation

## 2012-08-29 DIAGNOSIS — F411 Generalized anxiety disorder: Secondary | ICD-10-CM | POA: Insufficient documentation

## 2012-08-29 LAB — BASIC METABOLIC PANEL
BUN: 12 mg/dL (ref 6–23)
CO2: 24 mEq/L (ref 19–32)
Calcium: 9.3 mg/dL (ref 8.4–10.5)
GFR calc non Af Amer: 76 mL/min — ABNORMAL LOW (ref >90)
Glucose, Bld: 105 mg/dL — ABNORMAL HIGH (ref 70–99)
Potassium: 4 mEq/L (ref 3.5–5.1)

## 2012-08-29 LAB — POCT I-STAT TROPONIN I: Troponin i, poc: 0 ng/mL (ref 0.00–0.08)

## 2012-08-29 LAB — CBC
HCT: 33.4 % — ABNORMAL LOW (ref 36.0–46.0)
Hemoglobin: 10.7 g/dL — ABNORMAL LOW (ref 12.0–15.0)
MCH: 27.4 pg (ref 26.0–34.0)
MCHC: 32 g/dL (ref 30.0–36.0)
RBC: 3.91 MIL/uL (ref 3.87–5.11)

## 2012-08-29 LAB — PROTIME-INR: INR: 1.05 (ref 0.00–1.49)

## 2012-08-29 MED ORDER — ASPIRIN 325 MG PO TABS
325.0000 mg | ORAL_TABLET | ORAL | Status: AC
  Administered 2012-08-29: 325 mg via ORAL
  Filled 2012-08-29: qty 1

## 2012-08-29 MED ORDER — NITROGLYCERIN 0.4 MG SL SUBL: 0.4000 mg | SUBLINGUAL_TABLET | SUBLINGUAL | Status: DC | PRN

## 2012-08-29 NOTE — ED Provider Notes (Signed)
History     CSN: [id number]  Arrival date & time 08/29/12  1221   First MD Initiated Contact with Patient 08/29/12 1400      Chief Complaint  Patient presents with  . Chest Pain  . Constipation    (Consider location/radiation/quality/duration/timing/severity/associated sxs/prior treatment) HPI Pt arrives w report of chest pain earlier today. Pt denies any chest pain. Does not recall any associated symptoms or exacerbating or alleviating factors.  Also report of constipation. Pt indicates she had bm in ED, and denies constipation. Pt denies abdominal pain or nv. No fever or chills. Pt denies any sob. Denies cough or uri c/o. No dysuria or gu c/o. Denies recent change in meds or new meds. States currently feels fine and requests d/c.       Past Medical History  Diagnosis Date  . ANEMIA, DEFICIENCY, HX OF 03/01/2010  . DEPRESSIVE DISORDER 09/19/2010  . DM w/o Complication Type II 05/18/2007  . GERD 05/18/2007  . HYPERLIPIDEMIA 03/17/2008  . HYPERTENSION 05/18/2007  . INSOMNIA 11/23/2008  . Loss of weight 05/30/2010  . Kidney stone   . Glaucoma(365)   . Anxiety     Past Surgical History  Procedure Date  . Abdominal hysterectomy   . Cataract extraction   . Hemorrhoid surgery   . Appendectomy   . Pacemaker insertion 09/2011  . Dilation and curettage of uterus   . Colonoscopy 06-28-10    per Dr. Arlyce Dice, diverticulosis only   . Esophagogastroduodenoscopy 05-13-11    per Dr. Arlyce Dice, normal     Family History  Problem Relation Age of Onset  . Brain cancer Father   . Colon cancer Sister   . Colitis Brother   . Heart attack Brother     History  Substance Use Topics  . Smoking status: Never Smoker   . Smokeless tobacco: Never Used  . Alcohol Use: No    OB History    Grav Para Term Preterm Abortions TAB SAB Ect Mult Living                  Review of Systems  Constitutional: Negative for fever and chills.  HENT: Negative for neck pain.   Eyes: Negative for redness.   Respiratory: Negative for cough and shortness of breath.   Cardiovascular: Negative for chest pain.  Gastrointestinal: Negative for vomiting and abdominal pain.  Genitourinary: Negative for flank pain.  Musculoskeletal: Negative for back pain.  Skin: Negative for rash.  Neurological: Negative for headaches.  Hematological: Does not bruise/bleed easily.  Psychiatric/Behavioral: Negative for confusion.    Allergies  Amoxicillin and Penicillins  Home Medications   Current Outpatient Rx  Name  Route  Sig  Dispense  Refill  . ACETAMINOPHEN 500 MG PO TABS   Oral   Take 1,000 mg by mouth every 6 (six) hours as needed. For pain         . ALPRAZOLAM 0.5 MG PO TABS   Oral   Take 0.5 mg by mouth 3 (three) times daily as needed. Agitation         . FAMOTIDINE 20 MG PO TABS   Oral   Take 20 mg by mouth 2 (two) times daily.          . FUROSEMIDE 20 MG PO TABS   Oral   Take 20 mg by mouth every morning.          Marland Kitchen HYDROCODONE-ACETAMINOPHEN 5-500 MG PO TABS   Oral   Take 1 tablet by mouth  every 8 (eight) hours as needed for pain.   60 tablet   1   . LORAZEPAM 0.5 MG PO TABS   Oral   Take 1 mg by mouth every 6 (six) hours.         Marland Kitchen METFORMIN HCL ER 500 MG PO TB24   Oral   Take 500 mg by mouth at bedtime.          . SERTRALINE HCL 25 MG PO TABS   Oral   Take 25 mg by mouth every morning.         Marland Kitchen SIMVASTATIN 80 MG PO TABS   Oral   Take 40 mg by mouth at bedtime.          . TRAMADOL HCL 50 MG PO TABS   Oral   Take 1 tablet (50 mg total) by mouth every 8 (eight) hours as needed for pain.   60 tablet   4     BP 165/58  Pulse 58  Temp 97.8 F (36.6 C) (Oral)  Resp 16  SpO2 100%  Physical Exam  Nursing note and vitals reviewed. Constitutional: She appears well-developed and well-nourished. No distress.  HENT:  Head: Atraumatic.  Nose: Nose normal.  Mouth/Throat: Oropharynx is clear and moist.  Eyes: Conjunctivae normal are normal. No scleral  icterus.  Neck: Neck supple. No tracheal deviation present.  Cardiovascular: Normal rate, regular rhythm, normal heart sounds and intact distal pulses.   Pulmonary/Chest: Effort normal and breath sounds normal. No respiratory distress. She exhibits no tenderness.  Abdominal: Soft. Normal appearance and bowel sounds are normal. She exhibits no distension and no mass. There is no tenderness. There is no rebound and no guarding.  Genitourinary:       No cva tenderness  Musculoskeletal: She exhibits no edema.  Neurological: She is alert.  Skin: Skin is warm and dry. No rash noted. She is not diaphoretic.  Psychiatric:       Mildly anxious    ED Course  Procedures (including critical care time)  Results for orders placed during the hospital encounter of 08/29/12  CBC      Component Value Range   WBC 8.9  4.0 - 10.5 K/uL   RBC 3.91  3.87 - 5.11 MIL/uL   Hemoglobin 10.7 (*) 12.0 - 15.0 g/dL   HCT 27.2 (*) 53.6 - 64.4 %   MCV 85.4  78.0 - 100.0 fL   MCH 27.4  26.0 - 34.0 pg   MCHC 32.0  30.0 - 36.0 g/dL   RDW 03.4  74.2 - 59.5 %   Platelets 358  150 - 400 K/uL  BASIC METABOLIC PANEL      Component Value Range   Sodium 136  135 - 145 mEq/L   Potassium 4.0  3.5 - 5.1 mEq/L   Chloride 101  96 - 112 mEq/L   CO2 24  19 - 32 mEq/L   Glucose, Bld 105 (*) 70 - 99 mg/dL   BUN 12  6 - 23 mg/dL   Creatinine, Ser 6.38  0.50 - 1.10 mg/dL   Calcium 9.3  8.4 - 75.6 mg/dL   GFR calc non Af Amer 76 (*) >90 mL/min   GFR calc Af Amer 89 (*) >90 mL/min  PROTIME-INR      Component Value Range   Prothrombin Time 13.6  11.6 - 15.2 seconds   INR 1.05  0.00 - 1.49  POCT I-STAT TROPONIN I      Component Value Range  Troponin i, poc 0.00  0.00 - 0.08 ng/mL   Comment 3            Dg Chest 2 View  08/03/2012  *RADIOLOGY REPORT*  Clinical Data: Chest pain  CHEST - 2 VIEW  Comparison: 07/27/2012  Findings: Left chest wall pacer device is noted with lead in the right atrial appendage and right ventricle.   Heart size is normal.  No pleural effusion or edema.  No airspace consolidation is identified.  IMPRESSION:  1.  No active cardiopulmonary abnormalities.   Original Report Authenticated By: Signa Kell, M.D.       MDM  Labs.   pts labs/troponin normal.   Pt denies any chest pain or discomfort, and does not recall having any chest pain earlier.  States had bm in ed. No abd pain. No nv. abd soft nt.   Pt requesting discharge, states feels fine, at baseline.  Pt appears stable for d/c.    Date: 08/29/2012  Rate: 60  Rhythm: atrial paced, pvcs  QRS Axis: normal  Intervals: normal  ST/T Wave abnormalities: nonspecific ST/T changes  Conduction Disutrbances:right bundle branch block  Narrative Interpretation:   Old EKG Reviewed: unchanged          Suzi Roots, MD 08/29/12 [phone]

## 2012-08-29 NOTE — ED Notes (Signed)
RN to obtain labs with start of IV 

## 2012-08-29 NOTE — ED Notes (Signed)
Chest Pian started this morning, pt has history of panic attacks since receiving pace maker, also reports constipation

## 2012-09-08 ENCOUNTER — Encounter (HOSPITAL_COMMUNITY): Payer: Self-pay | Admitting: *Deleted

## 2012-09-08 ENCOUNTER — Emergency Department (HOSPITAL_COMMUNITY)
Admission: EM | Admit: 2012-09-08 | Discharge: 2012-09-08 | Disposition: A | Discharge status: Home / Self Care | Payer: Medicare Other | Attending: Emergency Medicine | Admitting: Emergency Medicine

## 2012-09-08 DIAGNOSIS — F419 Anxiety disorder, unspecified: Secondary | ICD-10-CM

## 2012-09-08 DIAGNOSIS — F411 Generalized anxiety disorder: Secondary | ICD-10-CM | POA: Insufficient documentation

## 2012-09-08 DIAGNOSIS — Z8669 Personal history of other diseases of the nervous system and sense organs: Secondary | ICD-10-CM | POA: Insufficient documentation

## 2012-09-08 DIAGNOSIS — K219 Gastro-esophageal reflux disease without esophagitis: Secondary | ICD-10-CM | POA: Insufficient documentation

## 2012-09-08 DIAGNOSIS — Z90710 Acquired absence of both cervix and uterus: Secondary | ICD-10-CM | POA: Insufficient documentation

## 2012-09-08 DIAGNOSIS — F3289 Other specified depressive episodes: Secondary | ICD-10-CM | POA: Insufficient documentation

## 2012-09-08 DIAGNOSIS — E119 Type 2 diabetes mellitus without complications: Secondary | ICD-10-CM | POA: Insufficient documentation

## 2012-09-08 DIAGNOSIS — I10 Essential (primary) hypertension: Secondary | ICD-10-CM | POA: Insufficient documentation

## 2012-09-08 DIAGNOSIS — Z862 Personal history of diseases of the blood and blood-forming organs and certain disorders involving the immune mechanism: Secondary | ICD-10-CM | POA: Insufficient documentation

## 2012-09-08 DIAGNOSIS — Z79899 Other long term (current) drug therapy: Secondary | ICD-10-CM | POA: Insufficient documentation

## 2012-09-08 DIAGNOSIS — Z87442 Personal history of urinary calculi: Secondary | ICD-10-CM | POA: Insufficient documentation

## 2012-09-08 DIAGNOSIS — E785 Hyperlipidemia, unspecified: Secondary | ICD-10-CM | POA: Insufficient documentation

## 2012-09-08 DIAGNOSIS — Z8659 Personal history of other mental and behavioral disorders: Secondary | ICD-10-CM | POA: Insufficient documentation

## 2012-09-08 DIAGNOSIS — F329 Major depressive disorder, single episode, unspecified: Secondary | ICD-10-CM | POA: Insufficient documentation

## 2012-09-08 DIAGNOSIS — R0789 Other chest pain: Secondary | ICD-10-CM | POA: Insufficient documentation

## 2012-09-08 DIAGNOSIS — Z9889 Other specified postprocedural states: Secondary | ICD-10-CM | POA: Insufficient documentation

## 2012-09-08 DIAGNOSIS — Z9089 Acquired absence of other organs: Secondary | ICD-10-CM | POA: Insufficient documentation

## 2012-09-08 LAB — CBC
Platelets: 302 10*3/uL (ref 150–400)
RBC: 3.71 MIL/uL — ABNORMAL LOW (ref 3.87–5.11)
WBC: 9.7 10*3/uL (ref 4.0–10.5)

## 2012-09-08 LAB — BASIC METABOLIC PANEL
CO2: 24 mEq/L (ref 19–32)
Calcium: 9.5 mg/dL (ref 8.4–10.5)
Chloride: 99 mEq/L (ref 96–112)
Sodium: 137 mEq/L (ref 135–145)

## 2012-09-08 LAB — POCT I-STAT TROPONIN I: Troponin i, poc: 0 ng/mL (ref 0.00–0.08)

## 2012-09-08 NOTE — ED Notes (Signed)
Pt reports center chest pain since 1530 this afternoon. Sts pain has happened before "but not like this, it normally feels like indigestion, but this is so much worse." C/o "difficulty catching her breath." Pt denies n/v. +lightheadedness.

## 2012-09-08 NOTE — ED Provider Notes (Signed)
History     CSN: [id number]  Arrival date & time 09/08/12  4098   First MD Initiated Contact with Patient 09/08/12 1815      Chief Complaint  Patient presents with  . Chest Pain    (Consider location/radiation/quality/duration/timing/severity/associated sxs/prior treatment) HPI Comments: [name] presents with her son for evaluation of chest pain.  She reports sudden onset discomfort while sitting watching television.  She feels anxious.  She cannot describe the character of the discomfort but reports not feeling any currently.  Although she appears confused, her son states this is what happens when she becomes anxious or scared.  She has had several similar previous episodes.  Patient is a 79 y.o. female presenting with chest pain. The history is provided by the patient and a relative. No language interpreter was used.  Chest Pain The chest pain began 1 - 2 hours ago. The chest pain is resolved. Pertinent negatives for primary symptoms include no fever, no fatigue, no syncope, no shortness of breath, no cough, no wheezing, no palpitations, no abdominal pain, no nausea, no vomiting, no dizziness and no altered mental status.  Pertinent negatives for associated symptoms include no claudication, no diaphoresis, no lower extremity edema, no near-syncope, no numbness, no orthopnea, no paroxysmal nocturnal dyspnea and no weakness. She tried nothing for the symptoms.  Her past medical history is significant for diabetes, hyperlipidemia and hypertension.     Past Medical History  Diagnosis Date  . ANEMIA, DEFICIENCY, HX OF 03/01/2010  . DEPRESSIVE DISORDER 09/19/2010  . DM w/o Complication Type II 05/18/2007  . GERD 05/18/2007  . HYPERLIPIDEMIA 03/17/2008  . HYPERTENSION 05/18/2007  . INSOMNIA 11/23/2008  . Loss of weight 05/30/2010  . Kidney stone   . Glaucoma(365)   . Anxiety     Past Surgical History  Procedure Date  . Abdominal hysterectomy   . Cataract extraction   . Hemorrhoid  surgery   . Appendectomy   . Pacemaker insertion 09/2011  . Dilation and curettage of uterus   . Colonoscopy 06-28-10    per Dr. Arlyce Dice, diverticulosis only   . Esophagogastroduodenoscopy 05-13-11    per Dr. Arlyce Dice, normal     Family History  Problem Relation Age of Onset  . Brain cancer Father   . Colon cancer Sister   . Colitis Brother   . Heart attack Brother     History  Substance Use Topics  . Smoking status: Never Smoker   . Smokeless tobacco: Never Used  . Alcohol Use: No    OB History    Grav Para Term Preterm Abortions TAB SAB Ect Mult Living                  Review of Systems  Constitutional: Negative for fever, diaphoresis and fatigue.  Respiratory: Negative for cough, shortness of breath and wheezing.   Cardiovascular: Positive for chest pain. Negative for palpitations, orthopnea, claudication, syncope and near-syncope.  Gastrointestinal: Negative for nausea, vomiting and abdominal pain.  Neurological: Negative for dizziness, weakness and numbness.  Psychiatric/Behavioral: Negative for altered mental status.  All other systems reviewed and are negative.    Allergies  Amoxicillin and Penicillins  Home Medications   Current Outpatient Rx  Name  Route  Sig  Dispense  Refill  . ACETAMINOPHEN 500 MG PO TABS   Oral   Take 1,000 mg by mouth every 6 (six) hours as needed. For pain         . ALPRAZOLAM 0.5 MG PO TABS  Oral   Take 0.5 mg by mouth 3 (three) times daily as needed. Agitation         . FAMOTIDINE 20 MG PO TABS   Oral   Take 20 mg by mouth 2 (two) times daily.          . FUROSEMIDE 20 MG PO TABS   Oral   Take 20 mg by mouth every morning.          Marland Kitchen HYDROCODONE-ACETAMINOPHEN 5-500 MG PO TABS   Oral   Take 1 tablet by mouth every 8 (eight) hours as needed for pain.   60 tablet   1   . LORAZEPAM 0.5 MG PO TABS   Oral   Take 1 mg by mouth every 6 (six) hours.         Marland Kitchen METFORMIN HCL ER 500 MG PO TB24   Oral   Take 500 mg  by mouth at bedtime.          . SERTRALINE HCL 25 MG PO TABS   Oral   Take 25 mg by mouth every morning.         Marland Kitchen SIMVASTATIN 80 MG PO TABS   Oral   Take 40 mg by mouth at bedtime.          . TRAMADOL HCL 50 MG PO TABS   Oral   Take 1 tablet (50 mg total) by mouth every 8 (eight) hours as needed for pain.   60 tablet   4     BP 151/74  Temp 98.4 F (36.9 C) (Oral)  Resp 23  SpO2 100%  Physical Exam  Nursing note and vitals reviewed. Constitutional: She is oriented to person, place, and time. She appears well-developed and well-nourished. No distress.  HENT:  Head: Normocephalic and atraumatic.  Right Ear: External ear normal.  Left Ear: External ear normal.  Nose: Nose normal.  Mouth/Throat: Oropharynx is clear and moist. No oropharyngeal exudate.  Eyes: Conjunctivae normal are normal. Pupils are equal, round, and reactive to light. Right eye exhibits no discharge. Left eye exhibits no discharge. No scleral icterus.  Neck: Normal range of motion. Neck supple. No JVD present. No tracheal deviation present.  Cardiovascular: Normal rate, regular rhythm, normal heart sounds, intact distal pulses and normal pulses.   Occasional extrasystoles are present. PMI is not displaced.  Exam reveals no gallop, no friction rub and no decreased pulses.   No murmur heard. Pulmonary/Chest: Effort normal and breath sounds normal. No stridor. No respiratory distress. She has no wheezes. She has no rales. She exhibits no tenderness.  Abdominal: Soft. Bowel sounds are normal. She exhibits no distension and no mass. There is no tenderness. There is no rebound and no guarding.  Musculoskeletal: Normal range of motion. She exhibits edema (trace). She exhibits no tenderness.  Lymphadenopathy:    She has no cervical adenopathy.  Neurological: She is alert and oriented to person, place, and time. No cranial nerve deficit.  Skin: Skin is warm and dry. No rash noted. She is not diaphoretic. No  erythema. No pallor.  Psychiatric: Her mood appears anxious. Her affect is not angry, not blunt, not labile and not inappropriate. Her speech is delayed. Her speech is not rapid and/or pressured, not tangential and not slurred. She is not agitated, not aggressive, is not hyperactive, not slowed, not withdrawn, not actively hallucinating and not combative. Thought content is not paranoid and not delusional. Cognition and memory are impaired (pt is mildly confused regarding why  she came to the ER.). She does not exhibit a depressed mood. She is communicative. She is attentive.    ED Course  Procedures (including critical care time)   Labs Reviewed  CBC  BASIC METABOLIC PANEL   No results found.   No diagnosis found.    MDM  Pt presents for evaluation of chest pain.  She appears nontoxic, note stable VS, NAD.  Pt does appear anxious which her son reports has been a chronic issue.  I reviewed recent available medical records and note this to be her 4th visit for evaluation of chest discomfort in the last 2.5 months (the most recent being 12/28).  She is currently pain free.  She denied ever having discomfort to the nurse obtaining the triage evaluation.  Will perform a basic screening evaluation including trop x2.  If negative, will d/c home to follow-up closely with her PMD.  2300.  Pt is pain free.  Repeat trop is negative.  Plan discharge home.       Tobin Chad, MD 09/08/12 [phone]

## 2012-09-08 NOTE — ED Notes (Signed)
Pt denies chest pain at present. Pt states she has a little indigestion and points to area below sternum.Skin warm and dry. Family at bedside.

## 2012-09-08 NOTE — ED Notes (Signed)
MD at bedside. Dr. Powers at bedside.  

## 2012-09-09 ENCOUNTER — Other Ambulatory Visit: Payer: Self-pay | Admitting: Internal Medicine

## 2012-09-10 ENCOUNTER — Telehealth: Payer: Self-pay | Admitting: Internal Medicine

## 2012-09-10 NOTE — Telephone Encounter (Signed)
Patient Information:  Caller Name: [name]  Phone: [phone]  Patient: [name], [name]  Gender: Female  DOB: 21-Jun-1923  Age: 79 Years  PCP: Eleonore Chiquito [facility])  Office Follow Up:  Does the office need to follow up with this patient?: No  Instructions For The Office: N/A  RN Note:  Patient has increased anxiety by not seeing children recently.  Patient is currently asymptomatic however was seen recently in the emergency room and when symptomatic has chest pain, shortness of breath.  During emergency room evaluation no complications with heart or lungs. Caller transferred to office to schedule appointment for Emergency Room follow up that was indicated to occurr in 1-2 days.  Symptoms  Reason For Call & Symptoms: Anxiety attacks  Reviewed Health History In EMR: Yes  Reviewed Medications In EMR: Yes  Reviewed Allergies In EMR: Yes  Reviewed Surgeries / Procedures: Yes  Date of Onset of Symptoms: 09/08/2012  Guideline(s) Used:  No Protocol Available - Sick Adult  Disposition Per Guideline:   See Today or Tomorrow in Office  Reason For Disposition Reached:   Nursing judgment  Advice Given:  Call Back If:  New symptoms develop  You become worse.

## 2012-09-11 ENCOUNTER — Encounter: Payer: Self-pay | Admitting: Internal Medicine

## 2012-09-11 ENCOUNTER — Ambulatory Visit (INDEPENDENT_AMBULATORY_CARE_PROVIDER_SITE_OTHER): Payer: Medicare Other | Admitting: Internal Medicine

## 2012-09-11 VITALS — BP 120/72 | HR 83 | Temp 98.6°F | Resp 18 | Wt 132.0 lb

## 2012-09-11 DIAGNOSIS — I10 Essential (primary) hypertension: Secondary | ICD-10-CM

## 2012-09-11 DIAGNOSIS — R52 Pain, unspecified: Secondary | ICD-10-CM

## 2012-09-11 DIAGNOSIS — R0789 Other chest pain: Secondary | ICD-10-CM

## 2012-09-11 NOTE — Progress Notes (Signed)
Subjective:    Patient ID: [name], female    DOB: [birth year], 79 y.o.   MRN: [id number]  HPI  79 year old patient has a history of anxiety disorder and recurrent chest pain. She was seen in the ED again 3 days ago for chest pain. The patient does not recall any details of the visit or even why she didn't go to the ED. She is accompanied by her son and she has had no recurrent chest pain. Today she feels quite well. Hospital records reviewed  Past Medical History  Diagnosis Date  . ANEMIA, DEFICIENCY, HX OF 03/01/2010  . DEPRESSIVE DISORDER 09/19/2010  . DM w/o Complication Type II 05/18/2007  . GERD 05/18/2007  . HYPERLIPIDEMIA 03/17/2008  . HYPERTENSION 05/18/2007  . INSOMNIA 11/23/2008  . Loss of weight 05/30/2010  . Kidney stone   . Glaucoma(365)   . Anxiety     History   Social History  . Marital Status: Widowed    Spouse Name: N/A    Number of Children: 3  . Years of Education: N/A   Occupational History  . retired    Social History Main Topics  . Smoking status: Never Smoker   . Smokeless tobacco: Never Used  . Alcohol Use: No  . Drug Use: No  . Sexually Active: Not on file   Other Topics Concern  . Not on file   Social History Narrative  . No narrative on file    Past Surgical History  Procedure Date  . Abdominal hysterectomy   . Cataract extraction   . Hemorrhoid surgery   . Appendectomy   . Pacemaker insertion 09/2011  . Dilation and curettage of uterus   . Colonoscopy 06-28-10    per Dr. Arlyce Dice, diverticulosis only   . Esophagogastroduodenoscopy 05-13-11    per Dr. Arlyce Dice, normal     Family History  Problem Relation Age of Onset  . Brain cancer Father   . Colon cancer Sister   . Colitis Brother   . Heart attack Brother     Allergies  Allergen Reactions  . Amoxicillin Rash  . Penicillins Rash    Current Outpatient Prescriptions on File Prior to Visit  Medication Sig Dispense Refill  . acetaminophen (TYLENOL) 500 MG tablet Take  1,000 mg by mouth every 6 (six) hours as needed. For pain      . ALPRAZolam (XANAX) 0.5 MG tablet Take 0.5 mg by mouth 3 (three) times daily as needed. Agitation      . furosemide (LASIX) 20 MG tablet Take 20 mg by mouth every morning.       Marland Kitchen HYDROcodone-acetaminophen (VICODIN) 5-500 MG per tablet Take 1 tablet by mouth every 8 (eight) hours as needed for pain.  60 tablet  1  . LORazepam (ATIVAN) 0.5 MG tablet Take 1 mg by mouth every 6 (six) hours.      . metFORMIN (GLUCOPHAGE-XR) 500 MG 24 hr tablet Take 500 mg by mouth at bedtime.       . simvastatin (ZOCOR) 80 MG tablet TAKE 1/2 TABLET BY MOUTH EVERY DAY  45 tablet  2  . traMADol (ULTRAM) 50 MG tablet Take 1 tablet (50 mg total) by mouth every 8 (eight) hours as needed for pain.  60 tablet  4  . famotidine (PEPCID) 20 MG tablet Take 20 mg by mouth 2 (two) times daily.       . sertraline (ZOLOFT) 25 MG tablet Take 25 mg by mouth every morning.  BP 120/72  Pulse 83  Temp 98.6 F (37 C) (Oral)  Resp 18  Wt 132 lb (59.875 kg)  SpO2 99%       Review of Systems  Constitutional: Negative.   HENT: Negative for hearing loss, congestion, sore throat, rhinorrhea, dental problem, sinus pressure and tinnitus.   Eyes: Negative for pain, discharge and visual disturbance.  Respiratory: Negative for cough and shortness of breath.   Cardiovascular: Positive for chest pain. Negative for palpitations and leg swelling.  Gastrointestinal: Negative for nausea, vomiting, abdominal pain, diarrhea, constipation, blood in stool and abdominal distention.  Genitourinary: Negative for dysuria, urgency, frequency, hematuria, flank pain, vaginal bleeding, vaginal discharge, difficulty urinating, vaginal pain and pelvic pain.  Musculoskeletal: Negative for joint swelling, arthralgias and gait problem.  Skin: Negative for rash.  Neurological: Negative for dizziness, syncope, speech difficulty, weakness, numbness and headaches.  Hematological: Negative  for adenopathy.  Psychiatric/Behavioral: Negative for behavioral problems, dysphoric mood and agitation. The patient is not nervous/anxious.        Objective:   Physical Exam  Constitutional: She is oriented to person, place, and time. She appears well-developed and well-nourished.  HENT:  Head: Normocephalic.  Right Ear: External ear normal.  Left Ear: External ear normal.  Mouth/Throat: Oropharynx is clear and moist.  Eyes: Conjunctivae normal and EOM are normal. Pupils are equal, round, and reactive to light.  Neck: Normal range of motion. Neck supple. No thyromegaly present.  Cardiovascular: Normal rate, regular rhythm, normal heart sounds and intact distal pulses.   Pulmonary/Chest: Effort normal and breath sounds normal.       No chest wall tenderness O2 saturation 99%  Abdominal: Soft. Bowel sounds are normal. She exhibits no mass. There is no tenderness.  Musculoskeletal: Normal range of motion.  Lymphadenopathy:    She has no cervical adenopathy.  Neurological: She is alert and oriented to person, place, and time.  Skin: Skin is warm and dry. No rash noted.  Psychiatric: She has a normal mood and affect. Her behavior is normal.          Assessment & Plan:   Anxiety disorder Recurrent atypical chest pain. Will continue to use the alprazolam when necessary. The patient is not taking sertraline this was encouraged. Return in 3 months for followup. Hypertension stable Type 2 diabetes

## 2012-09-11 NOTE — Patient Instructions (Signed)
Limit your sodium (Salt) intake  Avoids foods high in acid such as tomatoes citrus juices, and spicy foods.  Avoid eating within two hours of lying down or before exercising.  Do not overheat.  Try smaller more frequent meals.  If symptoms persist, elevate the head of her bed 12 inches while sleeping.  Return in 3 months for follow-up

## 2012-10-05 ENCOUNTER — Other Ambulatory Visit: Payer: Self-pay | Admitting: Internal Medicine

## 2012-10-06 ENCOUNTER — Ambulatory Visit (INDEPENDENT_AMBULATORY_CARE_PROVIDER_SITE_OTHER): Payer: Medicare Other | Admitting: Internal Medicine

## 2012-10-06 ENCOUNTER — Encounter: Payer: Self-pay | Admitting: Internal Medicine

## 2012-10-06 VITALS — BP 182/70 | HR 66 | Wt 131.4 lb

## 2012-10-06 DIAGNOSIS — Z950 Presence of cardiac pacemaker: Secondary | ICD-10-CM

## 2012-10-06 DIAGNOSIS — R001 Bradycardia, unspecified: Secondary | ICD-10-CM

## 2012-10-06 DIAGNOSIS — I10 Essential (primary) hypertension: Secondary | ICD-10-CM

## 2012-10-06 DIAGNOSIS — I498 Other specified cardiac arrhythmias: Secondary | ICD-10-CM

## 2012-10-06 DIAGNOSIS — I4891 Unspecified atrial fibrillation: Secondary | ICD-10-CM

## 2012-10-06 LAB — PACEMAKER DEVICE OBSERVATION
AL AMPLITUDE: 4.3 mv
DEVICE MODEL PM: 123684
RV LEAD AMPLITUDE: 9.8 mv
RV LEAD IMPEDENCE PM: 936 Ohm
VENTRICULAR PACING PM: 1

## 2012-10-07 ENCOUNTER — Encounter: Payer: Self-pay | Admitting: Internal Medicine

## 2012-10-07 ENCOUNTER — Other Ambulatory Visit: Payer: Self-pay | Admitting: *Deleted

## 2012-10-07 MED ORDER — HYDROCODONE-ACETAMINOPHEN 5-325 MG PO TABS: 1.0000 | ORAL_TABLET | Freq: Three times a day (TID) | ORAL | Status: DC | PRN

## 2012-10-07 MED ORDER — FAMOTIDINE 20 MG PO TABS: 20.0000 mg | ORAL_TABLET | Freq: Two times a day (BID) | ORAL | Status: DC

## 2012-10-07 NOTE — Assessment & Plan Note (Signed)
Her blood pressure is elevated today. She notes that she did not take her medications before coming to see [place].she is instructed to continue her current medical therapy, and maintain a low-sodium diet.

## 2012-10-07 NOTE — Progress Notes (Signed)
HPI [name] returns today for followup. She is a very pleasant elderly woman with hypertension, symptomatic bradycardia, status post permanent pacemaker insertion. In the interim, she has been stable. She denies chest pain or shortness of breath. Minimal peripheral edema. No syncope. Allergies  Allergen Reactions  . Amoxicillin Rash  . Penicillins Rash     Current Outpatient Prescriptions  Medication Sig Dispense Refill  . acetaminophen (TYLENOL) 500 MG tablet Take 1,000 mg by mouth every 6 (six) hours as needed. For pain      . ALPRAZolam (XANAX) 0.5 MG tablet Take 0.5 mg by mouth 3 (three) times daily as needed. Agitation      . famotidine (PEPCID) 20 MG tablet Take 20 mg by mouth 2 (two) times daily.       . furosemide (LASIX) 20 MG tablet Take 20 mg by mouth every morning.       Marland Kitchen LORazepam (ATIVAN) 0.5 MG tablet Take 1 mg by mouth every 6 (six) hours.      . metFORMIN (GLUCOPHAGE-XR) 500 MG 24 hr tablet Take 500 mg by mouth at bedtime.       . sertraline (ZOLOFT) 25 MG tablet Take 25 mg by mouth every morning.      . simvastatin (ZOCOR) 80 MG tablet TAKE 1/2 TABLET BY MOUTH EVERY DAY  45 tablet  2  . traMADol (ULTRAM) 50 MG tablet Take 1 tablet (50 mg total) by mouth every 8 (eight) hours as needed for pain.  60 tablet  4  . HYDROcodone-acetaminophen (NORCO/VICODIN) 5-325 MG per tablet Take 1 tablet by mouth every 8 (eight) hours as needed for pain.  60 tablet  1     Past Medical History  Diagnosis Date  . ANEMIA, DEFICIENCY, HX OF 03/01/2010  . DEPRESSIVE DISORDER 09/19/2010  . DM w/o Complication Type II 05/18/2007  . GERD 05/18/2007  . HYPERLIPIDEMIA 03/17/2008  . HYPERTENSION 05/18/2007  . INSOMNIA 11/23/2008  . Loss of weight 05/30/2010  . Kidney stone   . Glaucoma(365)   . Anxiety     ROS:   All systems reviewed and negative except as noted in the HPI.   Past Surgical History  Procedure Date  . Abdominal hysterectomy   . Cataract extraction   . Hemorrhoid  surgery   . Appendectomy   . Pacemaker insertion 09/2011  . Dilation and curettage of uterus   . Colonoscopy 06-28-10    per Dr. Arlyce Dice, diverticulosis only   . Esophagogastroduodenoscopy 05-13-11    per Dr. Arlyce Dice, normal      Family History  Problem Relation Age of Onset  . Brain cancer Father   . Colon cancer Sister   . Colitis Brother   . Heart attack Brother      History   Social History  . Marital Status: Widowed    Spouse Name: N/A    Number of Children: 3  . Years of Education: N/A   Occupational History  . retired    Social History Main Topics  . Smoking status: Never Smoker   . Smokeless tobacco: Never Used  . Alcohol Use: No  . Drug Use: No  . Sexually Active: Not on file   Other Topics Concern  . Not on file   Social History Narrative  . No narrative on file     BP 182/70  Pulse 66  Wt 131 lb 6.4 oz (59.603 kg)  Physical Exam:  Well appearingelderly woman, NAD HEENT: Unremarkable Neck:  7 cm JVD, no  thyromegally Lungs:  Clearwith no wheezes, rales, or rhonchi. HEART:  Regular rate rhythm, no murmurs, no rubs, no clicks Abd:  soft, positive bowel sounds, no organomegally, no rebound, no guarding Ext:  2 plus pulses, no edema, no cyanosis, no clubbing Skin:  No rashes no nodules Neuro:  CN II through XII intact, motor grossly intact  DEVICE  Normal device function.  See PaceArt for details.   Assess/Plan:

## 2012-10-07 NOTE — Assessment & Plan Note (Signed)
Her [employer] dual-chamber pacemaker is working normally. We'll plan to recheck in several months. 

## 2012-10-12 ENCOUNTER — Ambulatory Visit: Payer: Medicare Other | Admitting: Internal Medicine

## 2012-10-15 ENCOUNTER — Ambulatory Visit: Payer: Self-pay | Admitting: Internal Medicine

## 2012-10-27 ENCOUNTER — Telehealth: Payer: Self-pay | Admitting: Internal Medicine

## 2012-10-27 ENCOUNTER — Encounter (HOSPITAL_COMMUNITY): Payer: Self-pay | Admitting: Emergency Medicine

## 2012-10-27 ENCOUNTER — Emergency Department (HOSPITAL_COMMUNITY): Payer: Medicare Other

## 2012-10-27 ENCOUNTER — Emergency Department (HOSPITAL_COMMUNITY)
Admission: EM | Admit: 2012-10-27 | Discharge: 2012-10-27 | Disposition: A | Discharge status: Home / Self Care | Payer: Medicare Other | Attending: Emergency Medicine | Admitting: Emergency Medicine

## 2012-10-27 DIAGNOSIS — E785 Hyperlipidemia, unspecified: Secondary | ICD-10-CM | POA: Insufficient documentation

## 2012-10-27 DIAGNOSIS — I10 Essential (primary) hypertension: Secondary | ICD-10-CM | POA: Insufficient documentation

## 2012-10-27 DIAGNOSIS — F411 Generalized anxiety disorder: Secondary | ICD-10-CM | POA: Insufficient documentation

## 2012-10-27 DIAGNOSIS — E119 Type 2 diabetes mellitus without complications: Secondary | ICD-10-CM | POA: Insufficient documentation

## 2012-10-27 DIAGNOSIS — Z862 Personal history of diseases of the blood and blood-forming organs and certain disorders involving the immune mechanism: Secondary | ICD-10-CM | POA: Insufficient documentation

## 2012-10-27 DIAGNOSIS — F3289 Other specified depressive episodes: Secondary | ICD-10-CM | POA: Insufficient documentation

## 2012-10-27 DIAGNOSIS — R0789 Other chest pain: Secondary | ICD-10-CM | POA: Insufficient documentation

## 2012-10-27 DIAGNOSIS — Z79899 Other long term (current) drug therapy: Secondary | ICD-10-CM | POA: Insufficient documentation

## 2012-10-27 DIAGNOSIS — R0602 Shortness of breath: Secondary | ICD-10-CM | POA: Insufficient documentation

## 2012-10-27 DIAGNOSIS — K219 Gastro-esophageal reflux disease without esophagitis: Secondary | ICD-10-CM | POA: Insufficient documentation

## 2012-10-27 DIAGNOSIS — Z950 Presence of cardiac pacemaker: Secondary | ICD-10-CM | POA: Insufficient documentation

## 2012-10-27 DIAGNOSIS — H409 Unspecified glaucoma: Secondary | ICD-10-CM | POA: Insufficient documentation

## 2012-10-27 DIAGNOSIS — Z8639 Personal history of other endocrine, nutritional and metabolic disease: Secondary | ICD-10-CM | POA: Insufficient documentation

## 2012-10-27 DIAGNOSIS — F419 Anxiety disorder, unspecified: Secondary | ICD-10-CM | POA: Diagnosis present

## 2012-10-27 DIAGNOSIS — R079 Chest pain, unspecified: Secondary | ICD-10-CM | POA: Diagnosis present

## 2012-10-27 DIAGNOSIS — Z87442 Personal history of urinary calculi: Secondary | ICD-10-CM | POA: Insufficient documentation

## 2012-10-27 DIAGNOSIS — G4700 Insomnia, unspecified: Secondary | ICD-10-CM | POA: Insufficient documentation

## 2012-10-27 DIAGNOSIS — Z8679 Personal history of other diseases of the circulatory system: Secondary | ICD-10-CM | POA: Insufficient documentation

## 2012-10-27 DIAGNOSIS — R011 Cardiac murmur, unspecified: Secondary | ICD-10-CM | POA: Insufficient documentation

## 2012-10-27 LAB — POCT I-STAT TROPONIN I: Troponin i, poc: 0 ng/mL (ref 0.00–0.08)

## 2012-10-27 LAB — BASIC METABOLIC PANEL
Calcium: 9.8 mg/dL (ref 8.4–10.5)
Chloride: 103 mEq/L (ref 96–112)
Creatinine, Ser: 0.82 mg/dL (ref 0.50–1.10)
GFR calc Af Amer: 72 mL/min — ABNORMAL LOW (ref >90)
GFR calc non Af Amer: 63 mL/min — ABNORMAL LOW (ref >90)

## 2012-10-27 LAB — CBC
MCHC: 32.4 g/dL (ref 30.0–36.0)
MCV: 82.9 fL (ref 78.0–100.0)
Platelets: 306 10*3/uL (ref 150–400)
RDW: 14.1 % (ref 11.5–15.5)
WBC: 8 10*3/uL (ref 4.0–10.5)

## 2012-10-27 MED ORDER — SODIUM CHLORIDE 0.9 % IV SOLN
INTRAVENOUS | Status: DC
  Administered 2012-10-27: 11:00:00 via INTRAVENOUS

## 2012-10-27 MED ORDER — SODIUM CHLORIDE 0.9 % IV BOLUS (SEPSIS)
250.0000 mL | Freq: Once | INTRAVENOUS | Status: AC
  Administered 2012-10-27: 250 mL via INTRAVENOUS

## 2012-10-27 NOTE — ED Provider Notes (Signed)
History     CSN: [id number]  Arrival date & time 10/27/12  0929   First MD Initiated Contact with Patient 10/27/12 [phone]      Chief Complaint  Patient presents with  . Chest Pain    (Consider location/radiation/quality/duration/timing/severity/associated sxs/prior treatment) The history is provided by the patient, the EMS personnel and a relative.   79 year old female. Followed by [facility] cardiology. Onset of chest pain at 7 this morning lasting until arrival at the emergency apartment which would've been around 9:30. The pain went completely away with sublingual nitroglycerin and one aspirin given by EMS. Patient has a pacemaker that was placed for irregular heart rate. Patient has had a history of chest pain seen several times in the emergency department and discharged home. Seen in January seen in December admitted early December for chest pain. Today's chest pain was 8/10 now 0/10 it was nonradiating. Not associated with nausea vomiting or diaphoresis. Patient has remained chest pain-free. Pain is described as sharp.  Past Medical History  Diagnosis Date  . ANEMIA, DEFICIENCY, HX OF 03/01/2010  . DEPRESSIVE DISORDER 09/19/2010  . DM w/o Complication Type II 05/18/2007  . GERD 05/18/2007  . HYPERLIPIDEMIA 03/17/2008  . HYPERTENSION 05/18/2007  . INSOMNIA 11/23/2008  . Loss of weight 05/30/2010  . Kidney stone   . Glaucoma(365)   . Anxiety     Past Surgical History  Procedure Laterality Date  . Abdominal hysterectomy    . Cataract extraction    . Hemorrhoid surgery    . Appendectomy    . Pacemaker insertion  09/2011  . Dilation and curettage of uterus    . Colonoscopy  06-28-10    per Dr. Arlyce Dice, diverticulosis only   . Esophagogastroduodenoscopy  05-13-11    per Dr. Arlyce Dice, normal     Family History  Problem Relation Age of Onset  . Brain cancer Father   . Colon cancer Sister   . Colitis Brother   . Heart attack Brother     History  Substance Use Topics  . Smoking status:  Never Smoker   . Smokeless tobacco: Never Used  . Alcohol Use: No    OB History   Grav Para Term Preterm Abortions TAB SAB Ect Mult Living                  Review of Systems  Constitutional: Negative for fever and diaphoresis.  HENT: Negative for congestion.   Eyes: Negative for visual disturbance.  Respiratory: Positive for shortness of breath.   Gastrointestinal: Negative for nausea, vomiting and abdominal pain.  Genitourinary: Negative for dysuria.  Musculoskeletal: Negative for back pain.  Skin: Negative for rash.  Neurological: Negative for headaches.  Hematological: Does not bruise/bleed easily.  Psychiatric/Behavioral: The patient is nervous/anxious.     Allergies  Amoxicillin and Penicillins  Home Medications   Current Outpatient Rx  Name  Route  Sig  Dispense  Refill  . acetaminophen (TYLENOL) 500 MG tablet   Oral   Take 1,000 mg by mouth every 6 (six) hours as needed. For pain         . furosemide (LASIX) 20 MG tablet   Oral   Take 20 mg by mouth every morning.          Marland Kitchen HYDROcodone-acetaminophen (NORCO/VICODIN) 5-325 MG per tablet   Oral   Take 1 tablet by mouth every 8 (eight) hours as needed for pain.   60 tablet   1   . LORazepam (ATIVAN) 0.5 MG  tablet   Oral   Take 0.5-1 mg by mouth 2 (two) times daily. 1 tablet in am and 2 in the evening         . metFORMIN (GLUCOPHAGE-XR) 500 MG 24 hr tablet   Oral   Take 500 mg by mouth daily with breakfast.         . sertraline (ZOLOFT) 25 MG tablet   Oral   Take 25 mg by mouth every morning.         . simvastatin (ZOCOR) 80 MG tablet   Oral   Take 40 mg by mouth at bedtime.         . traMADol (ULTRAM) 50 MG tablet   Oral   Take 1 tablet (50 mg total) by mouth every 8 (eight) hours as needed for pain.   60 tablet   4   . ALPRAZolam (XANAX) 0.5 MG tablet   Oral   Take 0.5 mg by mouth every 6 (six) hours as needed for anxiety.         . famotidine (PEPCID) 20 MG tablet   Oral    Take 1 tablet (20 mg total) by mouth 2 (two) times daily.   60 tablet   6     BP 99/54  Pulse 63  Temp(Src) 98.4 F (36.9 C) (Oral)  Resp 20  SpO2 100%  Physical Exam  Nursing note and vitals reviewed. Constitutional: She appears well-developed and well-nourished. No distress.  HENT:  Head: Normocephalic and atraumatic.  Eyes: Conjunctivae and EOM are normal. Pupils are equal, round, and reactive to light.  Neck: Normal range of motion.  Cardiovascular: Normal rate, regular rhythm and intact distal pulses.   Murmur heard. Pulmonary/Chest: Effort normal and breath sounds normal. No respiratory distress. She has no rales. She exhibits no tenderness.  Abdominal: Soft. Bowel sounds are normal. There is no tenderness.  Musculoskeletal: Normal range of motion. She exhibits no edema.  Neurological: She is alert. No cranial nerve deficit. She exhibits normal muscle tone. Coordination normal.  Skin: Skin is warm. No rash noted.    ED Course  Procedures (including critical care time)  Labs Reviewed  CBC - Abnormal; Notable for the following:    RBC 3.69 (*)    Hemoglobin 9.9 (*)    HCT 30.6 (*)    All other components within normal limits  BASIC METABOLIC PANEL - Abnormal; Notable for the following:    Glucose, Bld 108 (*)    GFR calc non Af Amer 63 (*)    GFR calc Af Amer 72 (*)    All other components within normal limits  TROPONIN I  POCT I-STAT TROPONIN I   Dg Chest Port 1 View  10/27/2012  *RADIOLOGY REPORT*  Clinical Data: Chest pain  PORTABLE CHEST - 1 VIEW  Comparison: 08/03/2012  Findings: There is a left chest wall pacer device with lead in the right atrial appendage and right ventricle.  The heart size and mediastinal contours are within normal limits.  Both lungs are clear.  The visualized skeletal structures are unremarkable.  IMPRESSION: No active cardiopulmonary abnormalities.   Original Report Authenticated By: Signa Kell, M.D.     Date: 10/27/2012  Rate:  60  Rhythm: indeterminate paced  QRS Axis: normal  Intervals: Paced  ST/T Wave abnormalities: Paced  Conduction Disutrbances:Paced  Narrative Interpretation:   Old EKG Reviewed: unchanged  Results for orders placed during the hospital encounter of 10/27/12  CBC      Result Value  Range   WBC 8.0  4.0 - 10.5 K/uL   RBC 3.69 (*) 3.87 - 5.11 MIL/uL   Hemoglobin 9.9 (*) 12.0 - 15.0 g/dL   HCT 16.1 (*) 09.6 - 04.5 %   MCV 82.9  78.0 - 100.0 fL   MCH 26.8  26.0 - 34.0 pg   MCHC 32.4  30.0 - 36.0 g/dL   RDW 40.9  81.1 - 91.4 %   Platelets 306  150 - 400 K/uL  BASIC METABOLIC PANEL      Result Value Range   Sodium 139  135 - 145 mEq/L   Potassium 4.4  3.5 - 5.1 mEq/L   Chloride 103  96 - 112 mEq/L   CO2 24  19 - 32 mEq/L   Glucose, Bld 108 (*) 70 - 99 mg/dL   BUN 20  6 - 23 mg/dL   Creatinine, Ser 7.82  0.50 - 1.10 mg/dL   Calcium 9.8  8.4 - 95.6 mg/dL   GFR calc non Af Amer 63 (*) >90 mL/min   GFR calc Af Amer 72 (*) >90 mL/min  TROPONIN I      Result Value Range   Troponin I <0.30  <0.30 ng/mL  POCT I-STAT TROPONIN I      Result Value Range   Troponin i, poc 0.00  0.00 - 0.08 ng/mL   Comment 3              1. Chest pain       MDM   [facility] cardiology to evaluate the patient in consultation I would consider admission because not able to confirm recent cardiac cath. Patient does have a pacemaker that was placed a year ago. Current rhythm is all paced. Patient with 1-2 hours worth of chest pain which resolved in route by EMS after giving given aspirin and one sublingual nitroglycerin. Upon arrival to the emergency department at 9:30 pain was all resolved. Pain did not recur.  Troponin x2 negative EKG without acute changes versus paced. Have asked cardiology to evaluate in consideration for admission. Patient has had similar chest pain several times in the past has not been admitted however this time the pain clearly went away with nitroglycerin. Disposition based on their  consultation if they want to send home is okay by me.       Shelda Jakes, MD 10/27/12 [phone]

## 2012-10-27 NOTE — ED Provider Notes (Signed)
6:54 PM [facility] cardiology has seen this patient in the ED and cleared her for discharge.  She is to follow up with her PMD as well as Dr. Ladona Ridgel.    Cindy Chick, MD 10/27/12 [phone]

## 2012-10-27 NOTE — Telephone Encounter (Signed)
For your review

## 2012-10-27 NOTE — Telephone Encounter (Signed)
Patient Information:  Caller Name: [name]  Phone: [phone]  Patient: [name], [name]  Gender: Female  DOB: 08/20/1923  Age: 79 Years  PCP: Eleonore Chiquito [facility])  Office Follow Up:  Does the office need to follow up with this patient?: No  Instructions For The Office: N/A  RN Note:  RN advised son to call EMS 911 for transport to ER.  Son voiced understanding.  Symptoms  Reason For Call & Symptoms: Son calling, states has had headache each am, onset 10/26/12, chest pain under L breast, toward center of chest,  difficulty swallowing, slight SOB, BP 158/87  this am.  Reviewed Health History In EMR: No  Reviewed Medications In EMR: No  Reviewed Allergies In EMR: No  Reviewed Surgeries / Procedures: No  Date of Onset of Symptoms: 10/26/2012  Guideline(s) Used:  Chest Pain  Disposition Per Guideline:   Call EMS 911 Now  Reason For Disposition Reached:   Chest pain lasting longer than 5 minutes and ANY of the following:  Over 5 years old Over 54 years old and at least one cardiac risk factor (i.e., high blood pressure, diabetes, high cholesterol, obesity, smoker or strong family history of heart disease) Pain is crushing, pressure-like, or heavy  Took nitroglycerin and chest pain was not relieved History of heart disease (i.e., angina, heart attack, bypass surgery, angioplasty, CHF)  Advice Given:  N/A

## 2012-10-27 NOTE — ED Notes (Signed)
Pt arrives via EMs with chest pain that began around 6am. Pt rated pain for EMS on arrival 1/10. Received 324mg  ASA, 1 SL nitro PTA with total relief of pain. Pt states pain was non-radiating in left chest with SOB. Denies nausea, vomiting, diaphoresis. Currently pain free with 20g in LAC.

## 2012-10-27 NOTE — Consult Note (Signed)
Cardiology Consult Note   Patient ID: [name] MRN: [id number], DOB/AGE: Dec 20, 1922 79 y.o. Date of Encounter: 10/27/2012  Primary Physician: Rogelia Boga, MD Primary Cardiologist: Lewayne Bunting  Chief Complaint: Chest Pain   HPI: [name] is an 78 year old female with a history of DM II, GERD, HLD, HTN, Anxiety and Depression. A dual chamber pacer was placed 10/2011 for symptomatic bradycardia. She presents to the ED today with complaints of chest pain. She is very tearful and anxious making it difficult to obtain details. She states that she awoke from sleep with 8/10 constant chest pain under her left breast that was not associated with diaphoresis, n/v, palpitations or SOB. She states that she is scared and "doesn't want to die." Her son, who lives with her, is at the bedside and stated that her chest pain persisted for 2-3 hours and did not go away with rest and he was instructed to call EMS. Upon arrival of EMS, no chest pain was present. She received 324mg  Aspirin and 1 dose of SL nitro en route. She has awoken with chest pain at least 2 other times in the past and has had negative cardiac work ups. Son states that these episodes of chest pain began occurring last year after she had her pacer placed. She currently denies chest pain and continues to deny n/v, SOB, diaphoresis, and dizziness. She complains of daily headaches, generalized aches, and difficulty sleeping.   Past Medical History  Diagnosis Date  . ANEMIA, DEFICIENCY, HX OF 03/01/2010  . DEPRESSIVE DISORDER 09/19/2010  . DM w/o Complication Type II 05/18/2007  . GERD 05/18/2007  . HYPERLIPIDEMIA 03/17/2008  . HYPERTENSION 05/18/2007  . INSOMNIA 11/23/2008  . Loss of weight 05/30/2010  . Kidney stone   . Glaucoma(365)   . Anxiety     Surgical History:  Past Surgical History  Procedure Laterality Date  . Abdominal hysterectomy    . Cataract extraction    . Hemorrhoid surgery    . Appendectomy    .  Pacemaker insertion  09/2011  . Dilation and curettage of uterus    . Colonoscopy  06-28-10    per Dr. Arlyce Dice, diverticulosis only   . Esophagogastroduodenoscopy  05-13-11    per Dr. Arlyce Dice, normal      I have reviewed the patient's current medications. Prior to Admission medications   Medication Sig Start Date End Date Taking? Authorizing Provider  acetaminophen (TYLENOL) 500 MG tablet Take 1,000 mg by mouth every 6 (six) hours as needed. For pain   Yes Historical Provider, MD  furosemide (LASIX) 20 MG tablet Take 20 mg by mouth every morning.    Yes Historical Provider, MD  HYDROcodone-acetaminophen (NORCO/VICODIN) 5-325 MG per tablet Take 1 tablet by mouth every 8 (eight) hours as needed for pain. 10/07/12  Yes Gordy Savers, MD  LORazepam (ATIVAN) 0.5 MG tablet Take 0.5-1 mg by mouth 2 (two) times daily. 1 tablet in am and 2 in the evening   Yes Historical Provider, MD  metFORMIN (GLUCOPHAGE-XR) 500 MG 24 hr tablet Take 500 mg by mouth daily with breakfast.   Yes Historical Provider, MD  sertraline (ZOLOFT) 25 MG tablet Take 25 mg by mouth every morning. 06/19/12  Yes Gordy Savers, MD  simvastatin (ZOCOR) 80 MG tablet Take 40 mg by mouth at bedtime.   Yes Historical Provider, MD  traMADol (ULTRAM) 50 MG tablet Take 1 tablet (50 mg total) by mouth every 8 (eight) hours as needed for pain.  05/19/12  Yes Gordy Savers, MD  ALPRAZolam Prudy Feeler) 0.5 MG tablet Take 0.5 mg by mouth every 6 (six) hours as needed for anxiety.    Historical Provider, MD  famotidine (PEPCID) 20 MG tablet Take 1 tablet (20 mg total) by mouth 2 (two) times daily. 10/06/12   Marinus Maw, MD   Scheduled Meds:  Continuous Infusions: . sodium chloride 100 mL/hr at 10/27/12 1110   PRN Meds:.  Allergies:  Allergies  Allergen Reactions  . Amoxicillin Rash  . Penicillins Rash    History   Social History  . Marital Status: Widowed    Spouse Name: N/A    Number of Children: 3  . Years of Education:  N/A   Occupational History  . retired    Social History Main Topics  . Smoking status: Never Smoker   . Smokeless tobacco: Never Used  . Alcohol Use: No  . Drug Use: No  . Sexually Active: Not on file   Other Topics Concern  . Not on file   Social History Narrative  . No narrative on file    Family History  Problem Relation Age of Onset  . Brain cancer Father   . Colon cancer Sister   . Colitis Brother   . Heart attack Brother    Family Status  Relation Status Death Age  . Mother Deceased     Unknown cause, age 36  . Father Deceased     Brain tumor, old age  . Sister Deceased     Review of Systems:   Full 14-point review of systems otherwise negative except as noted above.  Physical Exam: Blood pressure 99/54, pulse 63, temperature 98.4 F (36.9 C), temperature source Oral, resp. rate 20, SpO2 100.00%. General: Well developed, well nourished, anxious elderly female in  no acute distress. Head: Normocephalic, atraumatic, sclera non-icteric, no xanthomas, nares are without discharge. Dentition: poor Neck: No carotid bruits. JVD not elevated. No thyromegally Lungs: Good expansion bilaterally. without wheezes or rhonchi.  Heart: Regular rate and rhythm with S1 S2.  No S3 or S4.  No murmur, no rubs, or gallops appreciated. Abdomen: Soft, non-tender, non-distended with normoactive bowel sounds. No hepatomegaly. No rebound/guarding. No obvious abdominal masses. Msk:  Strength and tone appear normal for age. No joint deformities or effusions, no spine or costo-vertebral angle tenderness. Extremities: No clubbing or cyanosis. +1 edema.  Distal pedal pulses are 1+ in 4 extrem Neuro: Alert and oriented X 3. Moves all extremities spontaneously. No focal deficits noted. Psych:  Responds to questions appropriately with anxious affect. Skin: No rashes or lesions noted  Labs:   Lab Results  Component Value Date   WBC 8.0 10/27/2012   HGB 9.9* 10/27/2012   HCT 30.6* 10/27/2012    MCV 82.9 10/27/2012   PLT 306 10/27/2012      Recent Labs Lab 10/27/12 0947  NA 139  K 4.4  CL 103  CO2 24  BUN 20  CREATININE 0.82  CALCIUM 9.8  GLUCOSE 108*    Recent Labs  10/27/12 1206  TROPONINI <0.30    Recent Labs  10/27/12 1003  TROPIPOC 0.00   Lab Results  Component Value Date   CHOL 164 09/29/2011   HDL 59 09/29/2011   LDLCALC 78 09/29/2011   TRIG 136 09/29/2011   Pro B Natriuretic peptide (BNP)  Date/Time Value Range Status  03/05/2011  2:37 PM 47.0  0.0 - 100.0 pg/mL Final  02/08/2011  9:29 AM 151.0* 0.0 - 100.0  pg/mL Final   Lab Results  Component Value Date   DDIMER 2.10* 05/14/2011    Radiology/Studies:  Dg Chest Port 1 View  10/27/2012  *RADIOLOGY REPORT*  Clinical Data: Chest pain  PORTABLE CHEST - 1 VIEW  Comparison: 08/03/2012  Findings: There is a left chest wall pacer device with lead in the right atrial appendage and right ventricle.  The heart size and mediastinal contours are within normal limits.  Both lungs are clear.  The visualized skeletal structures are unremarkable.  IMPRESSION: No active cardiopulmonary abnormalities.   Original Report Authenticated By: Signa Kell, M.D.     Echo: 09/30/2011  Left ventricle: The cavity size was normal. Wall thickness was normal. The estimated ejection fraction was 55%. Regional wall motion abnormalities cannot be excluded.  ------------------------------------------------------------ Aortic valve: Structurally normal valve. Cusp separation was normal. Doppler: Transvalvular velocity was within the normal range. There was no stenosis. Mild regurgitation.  ------------------------------------------------------------ Aorta: Aortic root: The aortic root was normal in size.  ------------------------------------------------------------ Mitral valve: The valve appears to be grossly normal. Doppler: Mild regurgitation. Peak gradient: 4mm Hg  (D).  ------------------------------------------------------------ Left atrium: The atrium was normal in size.  ------------------------------------------------------------ Right ventricle: The cavity size was normal. Systolic function was normal.  ------------------------------------------------------------ Pulmonic valve: The valve appears to be grossly normal. Doppler: No significant regurgitation.  ------------------------------------------------------------ Tricuspid valve: The valve appears to be grossly normal. Doppler: Mild regurgitation.  ------------------------------------------------------------ Right atrium: Poorly visualized.  ------------------------------------------------------------ Pericardium: There was no pericardial effusion.     ECG: Atrail paced, rate of 60, RBBB  ASSESSMENT AND PLAN:   1. Possible Angina: Appears stable and chest pain likely non-cardiac. Cardiac enzymes negative x2. EKG demonstrates Atrial paced with RBBB rate 60. Normotensive. Currently denying chest pain. Since enzymes have remained negative, she can be d/c'd with plan to follow up with her Cardiologist.   2. Anxiety: Recommended she speak with her PCP regarding options for managing her anxiety and depression. Per son, it seems as though these episodes of chest pain are always related to anxiety and she does not ever want to be alone. She expressed feeling scared and a fear of dying multiples times. Pt states she will follow up with Dr Kirtland Bouchard.  Elberta Leatherwood 10/27/2012, 5:59 PM Seen in the emergency room with Theodore Demark PAC.  Since arrival she has been pain-free.  She is now imploring [place] to let her go home.  Her cardiac enzymes are normal and her electrocardiogram shows atrial pacing and a right bundle branch block and no ischemic changes.  I believe that she can be safely discharged home from the emergency room to followup with her cardiologist and her primary care  physician.  Anxiety appears to be playing a major role in her current situation.  Her physical examination is unremarkable. There is no pericardial rub and there is no gallop.  The lungs are clear.

## 2012-11-02 ENCOUNTER — Ambulatory Visit: Payer: Medicare Other | Admitting: Internal Medicine

## 2012-11-05 ENCOUNTER — Ambulatory Visit (INDEPENDENT_AMBULATORY_CARE_PROVIDER_SITE_OTHER): Payer: Medicare Other | Admitting: Internal Medicine

## 2012-11-05 ENCOUNTER — Encounter: Payer: Self-pay | Admitting: Internal Medicine

## 2012-11-05 VITALS — BP 110/70 | HR 60 | Temp 98.1°F | Wt 132.0 lb

## 2012-11-05 DIAGNOSIS — F419 Anxiety disorder, unspecified: Secondary | ICD-10-CM

## 2012-11-05 MED ORDER — SERTRALINE HCL 50 MG PO TABS: 50.0000 mg | ORAL_TABLET | Freq: Every day | ORAL | Status: DC

## 2012-11-05 MED ORDER — LORAZEPAM 0.5 MG PO TABS: 0.5000 mg | ORAL_TABLET | Freq: Two times a day (BID) | ORAL | Status: DC

## 2012-11-05 MED ORDER — FAMOTIDINE 20 MG PO TABS: 20.0000 mg | ORAL_TABLET | Freq: Two times a day (BID) | ORAL | Status: DC

## 2012-11-05 MED ORDER — ALPRAZOLAM 0.5 MG PO TABS: 0.5000 mg | ORAL_TABLET | Freq: Four times a day (QID) | ORAL | Status: DC | PRN

## 2012-11-05 NOTE — Patient Instructions (Addendum)
Limit your sodium (Salt) intake  Return in 3 months for follow-up  Take medications as directed

## 2012-11-05 NOTE — Progress Notes (Signed)
Subjective:    Patient ID: [name], female    DOB: 09/20/1922, 79 y.o.   MRN: [id number]  HPI  79 year old patient who is seen today following an ER visit. She has a history of anxiety and panic disorder as well as an extensive cardiac history. She presented to the ER again recently with atypical chest pain. Evaluation was noncontributory. ED records reviewed  Past Medical History  Diagnosis Date  . ANEMIA, DEFICIENCY, HX OF 03/01/2010  . DEPRESSIVE DISORDER 09/19/2010  . DM w/o Complication Type II 05/18/2007  . GERD 05/18/2007  . HYPERLIPIDEMIA 03/17/2008  . HYPERTENSION 05/18/2007  . INSOMNIA 11/23/2008  . Loss of weight 05/30/2010  . Kidney stone   . Glaucoma(365)   . Anxiety     History   Social History  . Marital Status: Widowed    Spouse Name: N/A    Number of Children: 3  . Years of Education: N/A   Occupational History  . retired    Social History Main Topics  . Smoking status: Never Smoker   . Smokeless tobacco: Never Used  . Alcohol Use: No  . Drug Use: No  . Sexually Active: Not on file   Other Topics Concern  . Not on file   Social History Narrative  . No narrative on file    Past Surgical History  Procedure Laterality Date  . Abdominal hysterectomy    . Cataract extraction    . Hemorrhoid surgery    . Appendectomy    . Pacemaker insertion  09/2011  . Dilation and curettage of uterus    . Colonoscopy  06-28-10    per Dr. Arlyce Dice, diverticulosis only   . Esophagogastroduodenoscopy  05-13-11    per Dr. Arlyce Dice, normal     Family History  Problem Relation Age of Onset  . Brain cancer Father   . Colon cancer Sister   . Colitis Brother   . Heart attack Brother     Allergies  Allergen Reactions  . Amoxicillin Rash  . Penicillins Rash    Current Outpatient Prescriptions on File Prior to Visit  Medication Sig Dispense Refill  . acetaminophen (TYLENOL) 500 MG tablet Take 1,000 mg by mouth every 6 (six) hours as needed. For pain      .  furosemide (LASIX) 20 MG tablet Take 20 mg by mouth every morning.       Marland Kitchen HYDROcodone-acetaminophen (NORCO/VICODIN) 5-325 MG per tablet Take 1 tablet by mouth every 8 (eight) hours as needed for pain.  60 tablet  1  . metFORMIN (GLUCOPHAGE-XR) 500 MG 24 hr tablet Take 500 mg by mouth daily with breakfast.      . simvastatin (ZOCOR) 80 MG tablet Take 40 mg by mouth at bedtime.      . traMADol (ULTRAM) 50 MG tablet Take 1 tablet (50 mg total) by mouth every 8 (eight) hours as needed for pain.  60 tablet  4   No current facility-administered medications on file prior to visit.    BP 110/70  Pulse 60  Temp(Src) 98.1 F (36.7 C) (Oral)  Wt 132 lb (59.875 kg)  BMI 23.39 kg/m2  SpO2 99%       Review of Systems  Constitutional: Negative.   HENT: Negative for hearing loss, congestion, sore throat, rhinorrhea, dental problem, sinus pressure and tinnitus.   Eyes: Negative for pain, discharge and visual disturbance.  Respiratory: Negative for cough and shortness of breath.   Cardiovascular: Positive for chest pain. Negative for palpitations  and leg swelling.  Gastrointestinal: Negative for nausea, vomiting, abdominal pain, diarrhea, constipation, blood in stool and abdominal distention.  Genitourinary: Negative for dysuria, urgency, frequency, hematuria, flank pain, vaginal bleeding, vaginal discharge, difficulty urinating, vaginal pain and pelvic pain.  Musculoskeletal: Negative for joint swelling, arthralgias and gait problem.  Skin: Negative for rash.  Neurological: Negative for dizziness, syncope, speech difficulty, weakness, numbness and headaches.  Hematological: Negative for adenopathy.  Psychiatric/Behavioral: Positive for behavioral problems, confusion, decreased concentration and agitation. Negative for dysphoric mood. The patient is nervous/anxious.        Objective:   Physical Exam  Constitutional: She is oriented to person, place, and time. She appears well-developed and  well-nourished.  HENT:  Head: Normocephalic.  Right Ear: External ear normal.  Left Ear: External ear normal.  Mouth/Throat: Oropharynx is clear and moist.  Eyes: Conjunctivae and EOM are normal. Pupils are equal, round, and reactive to light.  Neck: Normal range of motion. Neck supple. No thyromegaly present.  Cardiovascular: Normal rate, regular rhythm, normal heart sounds and intact distal pulses.   Pulmonary/Chest: Effort normal and breath sounds normal.  Abdominal: Soft. Bowel sounds are normal. She exhibits no mass. There is no tenderness.  Musculoskeletal: Normal range of motion.  Lymphadenopathy:    She has no cervical adenopathy.  Neurological: She is alert and oriented to person, place, and time.  Skin: Skin is warm and dry. No rash noted.  Psychiatric: She has a normal mood and affect. Her behavior is normal.          Assessment & Plan:   Anxiety/panic disorder. Will increase sertraline to 50 mg daily. We'll continue a maintenance dose of lorazepam. The patient had been treated in the past with alprazolam when necessary but apparently no longer has a prescription for this medication. This was refilled Atypical chest pain Hypertension stable Diabetes mellitus

## 2012-11-11 ENCOUNTER — Other Ambulatory Visit: Payer: Self-pay | Admitting: Internal Medicine

## 2012-11-17 ENCOUNTER — Other Ambulatory Visit: Payer: Self-pay | Admitting: Internal Medicine

## 2012-12-11 ENCOUNTER — Ambulatory Visit: Payer: Medicare Other | Admitting: Internal Medicine

## 2012-12-11 ENCOUNTER — Emergency Department (HOSPITAL_COMMUNITY)
Admission: EM | Admit: 2012-12-11 | Discharge: 2012-12-11 | Disposition: A | Discharge status: Home / Self Care | Payer: Medicare Other | Attending: Emergency Medicine | Admitting: Emergency Medicine

## 2012-12-11 ENCOUNTER — Emergency Department (HOSPITAL_COMMUNITY): Payer: Medicare Other

## 2012-12-11 ENCOUNTER — Telehealth: Payer: Self-pay | Admitting: Internal Medicine

## 2012-12-11 ENCOUNTER — Encounter (HOSPITAL_COMMUNITY): Payer: Self-pay | Admitting: Nurse Practitioner

## 2012-12-11 DIAGNOSIS — F410 Panic disorder [episodic paroxysmal anxiety] without agoraphobia: Secondary | ICD-10-CM | POA: Insufficient documentation

## 2012-12-11 DIAGNOSIS — Z8659 Personal history of other mental and behavioral disorders: Secondary | ICD-10-CM | POA: Insufficient documentation

## 2012-12-11 DIAGNOSIS — K219 Gastro-esophageal reflux disease without esophagitis: Secondary | ICD-10-CM | POA: Insufficient documentation

## 2012-12-11 DIAGNOSIS — E785 Hyperlipidemia, unspecified: Secondary | ICD-10-CM | POA: Insufficient documentation

## 2012-12-11 DIAGNOSIS — R142 Eructation: Secondary | ICD-10-CM | POA: Insufficient documentation

## 2012-12-11 DIAGNOSIS — Z87898 Personal history of other specified conditions: Secondary | ICD-10-CM | POA: Insufficient documentation

## 2012-12-11 DIAGNOSIS — I10 Essential (primary) hypertension: Secondary | ICD-10-CM

## 2012-12-11 DIAGNOSIS — Z862 Personal history of diseases of the blood and blood-forming organs and certain disorders involving the immune mechanism: Secondary | ICD-10-CM

## 2012-12-11 DIAGNOSIS — R1013 Epigastric pain: Secondary | ICD-10-CM

## 2012-12-11 DIAGNOSIS — F419 Anxiety disorder, unspecified: Secondary | ICD-10-CM

## 2012-12-11 DIAGNOSIS — D649 Anemia, unspecified: Secondary | ICD-10-CM | POA: Insufficient documentation

## 2012-12-11 DIAGNOSIS — F411 Generalized anxiety disorder: Secondary | ICD-10-CM | POA: Insufficient documentation

## 2012-12-11 DIAGNOSIS — R141 Gas pain: Secondary | ICD-10-CM | POA: Insufficient documentation

## 2012-12-11 DIAGNOSIS — E119 Type 2 diabetes mellitus without complications: Secondary | ICD-10-CM | POA: Insufficient documentation

## 2012-12-11 DIAGNOSIS — Z87442 Personal history of urinary calculi: Secondary | ICD-10-CM | POA: Insufficient documentation

## 2012-12-11 DIAGNOSIS — Z79899 Other long term (current) drug therapy: Secondary | ICD-10-CM | POA: Insufficient documentation

## 2012-12-11 LAB — TROPONIN I: Troponin I: <0.3 ng/mL (ref <0.30)

## 2012-12-11 LAB — URINALYSIS, ROUTINE W REFLEX MICROSCOPIC
Bilirubin Urine: NEGATIVE
Ketones, ur: NEGATIVE mg/dL
Leukocytes, UA: NEGATIVE
Nitrite: NEGATIVE
Protein, ur: NEGATIVE mg/dL

## 2012-12-11 LAB — CBC
MCH: 25.9 pg — ABNORMAL LOW (ref 26.0–34.0)
MCHC: 31.7 g/dL (ref 30.0–36.0)
Platelets: 323 10*3/uL (ref 150–400)

## 2012-12-11 LAB — BASIC METABOLIC PANEL
BUN: 16 mg/dL (ref 6–23)
Calcium: 9.6 mg/dL (ref 8.4–10.5)
Creatinine, Ser: 0.72 mg/dL (ref 0.50–1.10)
GFR calc non Af Amer: 75 mL/min — ABNORMAL LOW (ref >90)
Glucose, Bld: 105 mg/dL — ABNORMAL HIGH (ref 70–99)

## 2012-12-11 MED ORDER — HYDRALAZINE HCL 20 MG/ML IJ SOLN
10.0000 mg | Freq: Once | INTRAMUSCULAR | Status: AC
  Administered 2012-12-11: 10 mg via INTRAVENOUS
  Filled 2012-12-11: qty 0.5

## 2012-12-11 MED ORDER — LORAZEPAM 2 MG/ML IJ SOLN
1.0000 mg | Freq: Once | INTRAMUSCULAR | Status: AC
  Administered 2012-12-11: 1 mg via INTRAVENOUS
  Filled 2012-12-11: qty 1

## 2012-12-11 NOTE — ED Notes (Signed)
Patient transported to X-ray 

## 2012-12-11 NOTE — ED Notes (Signed)
Per pt's son, [name],  Pt began having headache and stomach pain (below navel) yesterday upon waking.  Pt developed anxiety throughout the day and a panic attack ensued at 1200 at which pt insisted on coming to the ED.  Pt has history of daily headache, abdominal discomfort and anxiety.  Pt stated that she feels scared because she thinks something may be wrong with her health.  Pt's other two children and pt's siblings do not visit her and son states that this is difficult for her.

## 2012-12-11 NOTE — ED Notes (Signed)
Pt aware of the need for a urine sample. 

## 2012-12-11 NOTE — ED Provider Notes (Signed)
History     CSN: [id number]  Arrival date & time 12/11/12  1226   First MD Initiated Contact with Patient 12/11/12 1241      No chief complaint on file.   (Consider location/radiation/quality/duration/timing/severity/associated sxs/prior treatment) HPI  Ms. Goulart comes in today complaining of severe headache and abdominal pain, and being very scared something is wrong.  He says the headache is in her forehead and is worse than her normal headaches. She denies any vision changes or dizziness.  She also complains of epigastric abdominal pain and nausea but no vomiting. She complains of shortness of breath, denies chest pain. She has not had a fever but says she has been shaking.  She has not had any falls or trauma to her head. She says she did take her blood pressure medication today.   Past Medical History  Diagnosis Date  . ANEMIA, DEFICIENCY, HX OF 03/01/2010  . DEPRESSIVE DISORDER 09/19/2010  . DM w/o Complication Type II 05/18/2007  . GERD 05/18/2007  . HYPERLIPIDEMIA 03/17/2008  . HYPERTENSION 05/18/2007  . INSOMNIA 11/23/2008  . Loss of weight 05/30/2010  . Kidney stone   . Glaucoma(365)   . Anxiety     Past Surgical History  Procedure Laterality Date  . Abdominal hysterectomy    . Cataract extraction    . Hemorrhoid surgery    . Appendectomy    . Pacemaker insertion  09/2011  . Dilation and curettage of uterus    . Colonoscopy  06-28-10    per Dr. Arlyce Dice, diverticulosis only   . Esophagogastroduodenoscopy  05-13-11    per Dr. Arlyce Dice, normal     Family History  Problem Relation Age of Onset  . Brain cancer Father   . Colon cancer Sister   . Colitis Brother   . Heart attack Brother     History  Substance Use Topics  . Smoking status: Never Smoker   . Smokeless tobacco: Never Used  . Alcohol Use: No   Review of Systems  Allergies  Amoxicillin and Penicillins  Home Medications   Current Outpatient Rx  Name  Route  Sig  Dispense  Refill  . acetaminophen  (TYLENOL) 500 MG tablet   Oral   Take 1,000 mg by mouth every 6 (six) hours as needed. For pain         . ALPRAZolam (XANAX) 0.5 MG tablet   Oral   Take 1 tablet (0.5 mg total) by mouth every 6 (six) hours as needed for anxiety.   60 tablet   3   . famotidine (PEPCID) 20 MG tablet   Oral   Take 1 tablet (20 mg total) by mouth 2 (two) times daily.   60 tablet   6   . furosemide (LASIX) 20 MG tablet   Oral   Take 20 mg by mouth every morning.          . furosemide (LASIX) 20 MG tablet      TAKE 1 TABLET BY MOUTH TWICE A DAY TO CONTROL SWELLING   60 tablet   2   . HYDROcodone-acetaminophen (NORCO/VICODIN) 5-325 MG per tablet   Oral   Take 1 tablet by mouth every 8 (eight) hours as needed for pain.   60 tablet   1   . LORazepam (ATIVAN) 0.5 MG tablet   Oral   Take 1-2 tablets (0.5-1 mg total) by mouth 2 (two) times daily. 1 tablet in am and 2 in the evening   90 tablet  3   . metFORMIN (GLUCOPHAGE-XR) 500 MG 24 hr tablet   Oral   Take 500 mg by mouth daily with breakfast.         . sertraline (ZOLOFT) 50 MG tablet   Oral   Take 1 tablet (50 mg total) by mouth daily.   90 tablet   5   . simvastatin (ZOCOR) 80 MG tablet   Oral   Take 40 mg by mouth at bedtime.         . traMADol (ULTRAM) 50 MG tablet      TAKE 1 TABLET BY MOUTH EVERY 8 HOURS AS NEEDED FOR PAIN   60 tablet   4     BP 182/66  Pulse 65  Temp(Src) 98.6 F (37 C) (Oral)  Resp 18  SpO2 100%  Physical Exam General appearance: alert, anxious Head: Normocephalic, without obvious abnormality, atraumatic Eyes: conjunctivae/corneas clear. PERRL, EOM's intact. Fundi benign. Lungs: clear to auscultation bilaterally Heart: regular rate and rhythm, S1, S2 normal, no murmur, click, rub or gallop Abdomen: soft, non-tender; bowel sounds normal; no masses,  no organomegaly Extremities: extremities normal, atraumatic, no cyanosis or edema Pulses: 2+ and symmetric Neurologic: CN II-XII in  tact, extremity strength and reflexes normal and symmetric.   ED Course  Procedures (including critical care time)  Labs Reviewed  BASIC METABOLIC PANEL - Abnormal; Notable for the following:    Glucose, Bld 105 (*)    GFR calc non Af Amer 75 (*)    GFR calc Af Amer 87 (*)    All other components within normal limits  CBC - Abnormal; Notable for the following:    RBC 3.78 (*)    Hemoglobin 9.8 (*)    HCT 30.9 (*)    MCH 25.9 (*)    All other components within normal limits  URINALYSIS, ROUTINE W REFLEX MICROSCOPIC  TROPONIN I   Dg Chest 2 View  12/11/2012  *RADIOLOGY REPORT*  Clinical Data: Shortness of breath and weakness.  CHEST - 2 VIEW  Comparison: 10/27/2012.  Findings: Trachea is midline.  Heart size stable.  Left-sided pacemaker lead tips project over the right atrium and right ventricle, as before.  Linear density is seen in the left infrahilar region.  Lungs are otherwise clear.  No pleural fluid.  IMPRESSION: Probable mild left infrahilar atelectasis.   Original Report Authenticated By: Leanna Battles, M.D.      1. Anxiety   2. Epigastric pain   3. ANEMIA, DEFICIENCY, HX OF   4. HTN (hypertension)       MDM  Headache and abdominal pain resolved- patient continues to feel scared. Discussed normal x-ray and lab studies, chronic anemia.  Will discharge home with close follow up with PCP.         Ardyth Gal, MD 12/11/12 [id number]

## 2012-12-11 NOTE — Telephone Encounter (Signed)
Son richard calling to the office with concern of patient "being cold".  Attempted to call back at [phone], Reached Voicemail and left message to call back for concerns questions, or changes. Office number provided. No contact

## 2012-12-12 NOTE — ED Provider Notes (Signed)
I saw and evaluated the patient, reviewed the resident's note and I agree with the findings and plan. Normal exam, workup neg, pain is now resolved. F/u with PMD. Return precautions given  Loren Racer, MD 12/12/12 1330

## 2013-01-07 ENCOUNTER — Encounter: Payer: Self-pay | Admitting: Internal Medicine

## 2013-01-07 ENCOUNTER — Ambulatory Visit (INDEPENDENT_AMBULATORY_CARE_PROVIDER_SITE_OTHER): Payer: Medicare Other | Admitting: *Deleted

## 2013-01-07 ENCOUNTER — Other Ambulatory Visit: Payer: Self-pay | Admitting: Internal Medicine

## 2013-01-07 DIAGNOSIS — I498 Other specified cardiac arrhythmias: Secondary | ICD-10-CM

## 2013-01-07 DIAGNOSIS — R001 Bradycardia, unspecified: Secondary | ICD-10-CM

## 2013-01-07 DIAGNOSIS — Z950 Presence of cardiac pacemaker: Secondary | ICD-10-CM

## 2013-01-19 ENCOUNTER — Other Ambulatory Visit: Payer: Self-pay | Admitting: Internal Medicine

## 2013-01-25 LAB — REMOTE PACEMAKER DEVICE
AL AMPLITUDE: 4.3 mv
AL IMPEDENCE PM: 528 Ohm
DEVICE MODEL PM: 123684
RV LEAD IMPEDENCE PM: 785 Ohm

## 2013-02-05 ENCOUNTER — Ambulatory Visit: Payer: Medicare Other | Admitting: Internal Medicine

## 2013-02-08 ENCOUNTER — Encounter: Payer: Self-pay | Admitting: Internal Medicine

## 2013-02-08 ENCOUNTER — Ambulatory Visit (INDEPENDENT_AMBULATORY_CARE_PROVIDER_SITE_OTHER): Payer: Medicare Other | Admitting: Internal Medicine

## 2013-02-08 VITALS — BP 130/60 | HR 94 | Temp 98.6°F | Resp 18 | Wt 134.0 lb

## 2013-02-08 DIAGNOSIS — I10 Essential (primary) hypertension: Secondary | ICD-10-CM

## 2013-02-08 DIAGNOSIS — M79609 Pain in unspecified limb: Secondary | ICD-10-CM

## 2013-02-08 DIAGNOSIS — I4891 Unspecified atrial fibrillation: Secondary | ICD-10-CM

## 2013-02-08 DIAGNOSIS — M79604 Pain in right leg: Secondary | ICD-10-CM

## 2013-02-08 DIAGNOSIS — E119 Type 2 diabetes mellitus without complications: Secondary | ICD-10-CM

## 2013-02-08 NOTE — Patient Instructions (Addendum)
Limit your sodium (Salt) intake   Please check your hemoglobin A1c every 3 months   

## 2013-02-08 NOTE — Progress Notes (Signed)
Subjective:    Patient ID: [name], female    DOB: 03/13/1923, 79 y.o.   MRN: [id number]  HPI   79 year old patient who is seen today for followup. She has hypertension diabetes and atrial fibrillation. She has a history of osteoarthritis as well as anxiety disorder. She is a copy by her son today and doing remarkably well. Complaints include occasional headaches and knee pain especially the right. In general has been fairly stable over the past 3 months  Past Medical History  Diagnosis Date  . ANEMIA, DEFICIENCY, HX OF 03/01/2010  . DEPRESSIVE DISORDER 09/19/2010  . DM w/o Complication Type II 05/18/2007  . GERD 05/18/2007  . HYPERLIPIDEMIA 03/17/2008  . HYPERTENSION 05/18/2007  . INSOMNIA 11/23/2008  . Loss of weight 05/30/2010  . Kidney stone   . Glaucoma   . Anxiety     History   Social History  . Marital Status: Widowed    Spouse Name: N/A    Number of Children: 3  . Years of Education: N/A   Occupational History  . retired    Social History Main Topics  . Smoking status: Never Smoker   . Smokeless tobacco: Never Used  . Alcohol Use: No  . Drug Use: No  . Sexually Active: Not on file   Other Topics Concern  . Not on file   Social History Narrative  . No narrative on file    Past Surgical History  Procedure Laterality Date  . Abdominal hysterectomy    . Cataract extraction    . Hemorrhoid surgery    . Appendectomy    . Pacemaker insertion  09/2011  . Dilation and curettage of uterus    . Colonoscopy  06-28-10    per Dr. Arlyce Dice, diverticulosis only   . Esophagogastroduodenoscopy  05-13-11    per Dr. Arlyce Dice, normal     Family History  Problem Relation Age of Onset  . Brain cancer Father   . Colon cancer Sister   . Colitis Brother   . Heart attack Brother     Allergies  Allergen Reactions  . Amoxicillin Rash  . Penicillins Rash    Current Outpatient Prescriptions on File Prior to Visit  Medication Sig Dispense Refill  . acetaminophen  (TYLENOL) 500 MG tablet Take 1,000 mg by mouth every 6 (six) hours as needed. For pain      . ALPRAZolam (XANAX) 0.5 MG tablet Take 1 tablet (0.5 mg total) by mouth every 6 (six) hours as needed for anxiety.  60 tablet  3  . famotidine (PEPCID) 20 MG tablet Take 1 tablet (20 mg total) by mouth 2 (two) times daily.  60 tablet  6  . furosemide (LASIX) 20 MG tablet TAKE 1 TABLET BY MOUTH TWICE A DAY TO CONTROL SWELLING  60 tablet  2  . HYDROcodone-acetaminophen (NORCO/VICODIN) 5-325 MG per tablet Take 1 tablet by mouth every 8 (eight) hours as needed for pain.  60 tablet  1  . LORazepam (ATIVAN) 0.5 MG tablet Take 1-2 tablets (0.5-1 mg total) by mouth 2 (two) times daily. 1 tablet in am and 2 in the evening  90 tablet  3  . metFORMIN (GLUCOPHAGE-XR) 500 MG 24 hr tablet TAKE 1 TABLET BY MOUTH EVERY DAY  90 tablet  0  . sertraline (ZOLOFT) 50 MG tablet Take 1 tablet (50 mg total) by mouth daily.  90 tablet  5  . simvastatin (ZOCOR) 80 MG tablet Take 40 mg by mouth at bedtime.      Marland Kitchen  traMADol (ULTRAM) 50 MG tablet TAKE 1 TABLET BY MOUTH EVERY 8 HOURS AS NEEDED FOR PAIN  60 tablet  4   No current facility-administered medications on file prior to visit.    BP 130/60  Pulse 94  Temp(Src) 98.6 F (37 C) (Oral)  Resp 18  Wt 134 lb (60.782 kg)  BMI 23.74 kg/m2  SpO2 97%       Review of Systems  Constitutional: Negative.   HENT: Negative for hearing loss, congestion, sore throat, rhinorrhea, dental problem, sinus pressure and tinnitus.   Eyes: Negative for pain, discharge and visual disturbance.  Respiratory: Negative for cough and shortness of breath.   Cardiovascular: Negative for chest pain, palpitations and leg swelling.  Gastrointestinal: Negative for nausea, vomiting, abdominal pain, diarrhea, constipation, blood in stool and abdominal distention.  Genitourinary: Negative for dysuria, urgency, frequency, hematuria, flank pain, vaginal bleeding, vaginal discharge, difficulty urinating,  vaginal pain and pelvic pain.  Musculoskeletal: Positive for arthralgias and gait problem. Negative for joint swelling.  Skin: Negative for rash.  Neurological: Negative for dizziness, syncope, speech difficulty, weakness, numbness and headaches.  Hematological: Negative for adenopathy.  Psychiatric/Behavioral: Negative for behavioral problems, dysphoric mood and agitation. The patient is not nervous/anxious.        Objective:   Physical Exam  Constitutional: She is oriented to person, place, and time. She appears well-developed and well-nourished.  HENT:  Head: Normocephalic.  Right Ear: External ear normal.  Left Ear: External ear normal.  Mouth/Throat: Oropharynx is clear and moist.  Eyes: Conjunctivae and EOM are normal. Pupils are equal, round, and reactive to light.  Neck: Normal range of motion. Neck supple. No thyromegaly present.  Cardiovascular: Normal rate, regular rhythm, normal heart sounds and intact distal pulses.   Pulmonary/Chest: Effort normal and breath sounds normal.  Abdominal: Soft. Bowel sounds are normal. She exhibits no mass. There is no tenderness.  Musculoskeletal: Normal range of motion. She exhibits edema.  Lymphadenopathy:    She has no cervical adenopathy.  Neurological: She is alert and oriented to person, place, and time.  Skin: Skin is warm and dry. No rash noted.  Psychiatric: She has a normal mood and affect. Her behavior is normal.          Assessment & Plan:   Hypertension well controlled Diabetes mellitus. Will check a hemoglobin A1c. Disturbance has been very well-controlled atrial fibrillation stable Chronic anxiety stable  Recheck 3 months

## 2013-02-09 LAB — HEMOGLOBIN A1C: Hgb A1c MFr Bld: 6.1 % (ref 4.6–6.5)

## 2013-02-24 ENCOUNTER — Other Ambulatory Visit: Payer: Self-pay | Admitting: Internal Medicine

## 2013-02-26 NOTE — Telephone Encounter (Signed)
Pt is out of med. Pt son is aware MD out of office today

## 2013-03-09 ENCOUNTER — Other Ambulatory Visit: Payer: Self-pay | Admitting: Internal Medicine

## 2013-04-15 ENCOUNTER — Ambulatory Visit: Payer: Medicare Other | Admitting: *Deleted

## 2013-04-22 ENCOUNTER — Encounter: Payer: Medicare Other | Admitting: *Deleted

## 2013-04-22 ENCOUNTER — Ambulatory Visit (INDEPENDENT_AMBULATORY_CARE_PROVIDER_SITE_OTHER): Payer: Medicare Other | Admitting: *Deleted

## 2013-04-22 DIAGNOSIS — I498 Other specified cardiac arrhythmias: Secondary | ICD-10-CM

## 2013-04-22 DIAGNOSIS — Z950 Presence of cardiac pacemaker: Secondary | ICD-10-CM

## 2013-04-22 DIAGNOSIS — R001 Bradycardia, unspecified: Secondary | ICD-10-CM

## 2013-04-28 ENCOUNTER — Other Ambulatory Visit: Payer: Self-pay | Admitting: Internal Medicine

## 2013-04-30 LAB — REMOTE PACEMAKER DEVICE
AL IMPEDENCE PM: 606 Ohm
RV LEAD AMPLITUDE: 10.4 mv
RV LEAD IMPEDENCE PM: 923 Ohm
VENTRICULAR PACING PM: 0

## 2013-05-06 ENCOUNTER — Encounter: Payer: Self-pay | Admitting: Internal Medicine

## 2013-05-06 ENCOUNTER — Ambulatory Visit (INDEPENDENT_AMBULATORY_CARE_PROVIDER_SITE_OTHER): Payer: Medicare Other | Admitting: Internal Medicine

## 2013-05-06 ENCOUNTER — Ambulatory Visit: Payer: Medicare Other | Admitting: Internal Medicine

## 2013-05-06 VITALS — BP 130/80 | HR 62 | Temp 98.5°F | Resp 20 | Wt 142.0 lb

## 2013-05-06 DIAGNOSIS — E785 Hyperlipidemia, unspecified: Secondary | ICD-10-CM

## 2013-05-06 DIAGNOSIS — I10 Essential (primary) hypertension: Secondary | ICD-10-CM

## 2013-05-06 DIAGNOSIS — E119 Type 2 diabetes mellitus without complications: Secondary | ICD-10-CM

## 2013-05-06 NOTE — Progress Notes (Signed)
Subjective:    Patient ID: [name], female    DOB: April 18, 1923, 79 y.o.   MRN: [id number]  HPI  79 year old patient who is seen today for her quarterly followup. She has a history of type 2 diabetes which has been under very tight control on metformin therapy alone. No new concerns or complaints. She has a history of the panic disorder which has been quite stable. No recent ED visits. She has hypertension and dyslipidemia. No cardiopulmonary complaints  Past Medical History  Diagnosis Date  . ANEMIA, DEFICIENCY, HX OF 03/01/2010  . DEPRESSIVE DISORDER 09/19/2010  . DM w/o Complication Type II 05/18/2007  . GERD 05/18/2007  . HYPERLIPIDEMIA 03/17/2008  . HYPERTENSION 05/18/2007  . INSOMNIA 11/23/2008  . Loss of weight 05/30/2010  . Kidney stone   . Glaucoma   . Anxiety     History   Social History  . Marital Status: Widowed    Spouse Name: N/A    Number of Children: 3  . Years of Education: N/A   Occupational History  . retired    Social History Main Topics  . Smoking status: Never Smoker   . Smokeless tobacco: Never Used  . Alcohol Use: No  . Drug Use: No  . Sexual Activity: Not on file   Other Topics Concern  . Not on file   Social History Narrative  . No narrative on file    Past Surgical History  Procedure Laterality Date  . Abdominal hysterectomy    . Cataract extraction    . Hemorrhoid surgery    . Appendectomy    . Pacemaker insertion  09/2011  . Dilation and curettage of uterus    . Colonoscopy  06-28-10    per Dr. Arlyce Dice, diverticulosis only   . Esophagogastroduodenoscopy  05-13-11    per Dr. Arlyce Dice, normal     Family History  Problem Relation Age of Onset  . Brain cancer Father   . Colon cancer Sister   . Colitis Brother   . Heart attack Brother     Allergies  Allergen Reactions  . Amoxicillin Rash  . Penicillins Rash    Current Outpatient Prescriptions on File Prior to Visit  Medication Sig Dispense Refill  . acetaminophen (TYLENOL)  500 MG tablet Take 1,000 mg by mouth every 6 (six) hours as needed. For pain      . ALPRAZolam (XANAX) 0.5 MG tablet TAKE 1 TABLET BY MOUTH EVERY 6 HOURS AS NEEDED FOR ANXIETY  60 tablet  3  . famotidine (PEPCID) 20 MG tablet Take 1 tablet (20 mg total) by mouth 2 (two) times daily.  60 tablet  6  . furosemide (LASIX) 20 MG tablet TAKE 1 TABLET BY MOUTH TWICE A DAY TO CONTROL SWELLING  60 tablet  2  . LORazepam (ATIVAN) 0.5 MG tablet Take 1-2 tablets (0.5-1 mg total) by mouth 2 (two) times daily. 1 tablet in am and 2 in the evening  90 tablet  3  . metFORMIN (GLUCOPHAGE-XR) 500 MG 24 hr tablet TAKE 1 TABLET BY MOUTH EVERY DAY  90 tablet  0  . sertraline (ZOLOFT) 50 MG tablet Take 1 tablet (50 mg total) by mouth daily.  90 tablet  5  . simvastatin (ZOCOR) 80 MG tablet Take 40 mg by mouth at bedtime.      . traMADol (ULTRAM) 50 MG tablet TAKE 1 TABLET BY MOUTH EVERY 8 HOURS AS NEEDED FOR PAIN  60 tablet  2   No current facility-administered  medications on file prior to visit.    BP 130/80  Pulse 62  Temp(Src) 98.5 F (36.9 C) (Oral)  Resp 20  Wt 142 lb (64.411 kg)  BMI 25.16 kg/m2  SpO2 97%       Review of Systems  Constitutional: Positive for fatigue.  HENT: Negative for hearing loss, congestion, sore throat, rhinorrhea, dental problem, sinus pressure and tinnitus.   Eyes: Negative for pain, discharge and visual disturbance.  Respiratory: Negative for cough and shortness of breath.   Cardiovascular: Negative for chest pain, palpitations and leg swelling.  Gastrointestinal: Negative for nausea, vomiting, abdominal pain, diarrhea, constipation, blood in stool and abdominal distention.  Genitourinary: Negative for dysuria, urgency, frequency, hematuria, flank pain, vaginal bleeding, vaginal discharge, difficulty urinating, vaginal pain and pelvic pain.  Musculoskeletal: Positive for back pain and arthralgias. Negative for joint swelling and gait problem.  Skin: Negative for rash.   Neurological: Positive for light-headedness and headaches. Negative for dizziness, syncope, speech difficulty, weakness and numbness.  Hematological: Negative for adenopathy.  Psychiatric/Behavioral: Negative for behavioral problems, dysphoric mood and agitation. The patient is not nervous/anxious.        Objective:   Physical Exam  Constitutional: She is oriented to person, place, and time. She appears well-developed and well-nourished.  HENT:  Head: Normocephalic.  Right Ear: External ear normal.  Left Ear: External ear normal.  Mouth/Throat: Oropharynx is clear and moist.  Eyes: Conjunctivae and EOM are normal. Pupils are equal, round, and reactive to light.  Neck: Normal range of motion. Neck supple. No thyromegaly present.  Cardiovascular: Normal rate, regular rhythm, normal heart sounds and intact distal pulses.   Pulmonary/Chest: Effort normal and breath sounds normal.  Abdominal: Soft. Bowel sounds are normal. She exhibits no mass. There is no tenderness.  Musculoskeletal: Normal range of motion.  Lymphadenopathy:    She has no cervical adenopathy.  Neurological: She is alert and oriented to person, place, and time.  Skin: Skin is warm and dry. No rash noted.  Psychiatric: She has a normal mood and affect. Her behavior is normal.          Assessment & Plan:   Diabetes mellitus. This has been clinically stable hemoglobin A1c is very well controlled. We'll defer today since  has been less than 3 months and recheck next visit Hypertension well controlled Anxiety disorder. Fairly stable  Recheck 3 months

## 2013-05-06 NOTE — Patient Instructions (Signed)
Limit your sodium (Salt) intake    It is important that you exercise regularly, at least 20 minutes 3 to 4 times per week.  If you develop chest pain or shortness of breath seek  medical attention. 

## 2013-05-15 ENCOUNTER — Other Ambulatory Visit: Payer: Self-pay | Admitting: Internal Medicine

## 2013-05-18 ENCOUNTER — Ambulatory Visit (INDEPENDENT_AMBULATORY_CARE_PROVIDER_SITE_OTHER): Payer: Medicare Other

## 2013-05-18 DIAGNOSIS — Z23 Encounter for immunization: Secondary | ICD-10-CM

## 2013-05-23 ENCOUNTER — Other Ambulatory Visit: Payer: Self-pay | Admitting: Internal Medicine

## 2013-06-01 ENCOUNTER — Encounter: Payer: Self-pay | Admitting: Internal Medicine

## 2013-06-02 ENCOUNTER — Other Ambulatory Visit: Payer: Self-pay | Admitting: Internal Medicine

## 2013-06-04 ENCOUNTER — Other Ambulatory Visit: Payer: Self-pay | Admitting: *Deleted

## 2013-06-04 MED ORDER — METFORMIN HCL ER 500 MG PO TB24: ORAL_TABLET | ORAL | Status: DC

## 2013-06-04 MED ORDER — TRAMADOL HCL 50 MG PO TABS: ORAL_TABLET | ORAL | Status: DC

## 2013-06-04 MED ORDER — FAMOTIDINE 20 MG PO TABS: 20.0000 mg | ORAL_TABLET | Freq: Two times a day (BID) | ORAL | Status: DC

## 2013-06-04 MED ORDER — SIMVASTATIN 80 MG PO TABS: 40.0000 mg | ORAL_TABLET | Freq: Every day | ORAL | Status: DC

## 2013-06-04 MED ORDER — ALPRAZOLAM 0.5 MG PO TABS: ORAL_TABLET | ORAL | Status: DC

## 2013-06-04 MED ORDER — SERTRALINE HCL 50 MG PO TABS: 50.0000 mg | ORAL_TABLET | Freq: Every day | ORAL | Status: DC

## 2013-06-04 MED ORDER — FUROSEMIDE 20 MG PO TABS: ORAL_TABLET | ORAL | Status: DC

## 2013-06-04 NOTE — Telephone Encounter (Signed)
Rx's for Tramadol and Alprazolam faxed to [facility] pharmacy.

## 2013-06-15 ENCOUNTER — Ambulatory Visit (INDEPENDENT_AMBULATORY_CARE_PROVIDER_SITE_OTHER): Payer: Medicare Other | Admitting: Internal Medicine

## 2013-06-15 ENCOUNTER — Encounter: Payer: Self-pay | Admitting: Internal Medicine

## 2013-06-15 VITALS — BP 130/60 | HR 88 | Temp 98.3°F | Resp 20 | Wt 143.0 lb

## 2013-06-15 DIAGNOSIS — F419 Anxiety disorder, unspecified: Secondary | ICD-10-CM

## 2013-06-15 DIAGNOSIS — E119 Type 2 diabetes mellitus without complications: Secondary | ICD-10-CM

## 2013-06-15 DIAGNOSIS — I10 Essential (primary) hypertension: Secondary | ICD-10-CM

## 2013-06-15 DIAGNOSIS — F411 Generalized anxiety disorder: Secondary | ICD-10-CM

## 2013-06-15 NOTE — Patient Instructions (Signed)
Limit your sodium (Salt) intake  Return in 6 months for follow-up  

## 2013-06-15 NOTE — Progress Notes (Signed)
Subjective:    Patient ID: [name], female    DOB: [birth year], 79 y.o.   MRN: [id number]  HPI  79 year old patient who is seen today for followup. She has a history of type 2 diabetes hypertension and anxiety depression. She is doing quite well and is accompanied by her son. No cardiopulmonary complaints.  Past Medical History  Diagnosis Date  . ANEMIA, DEFICIENCY, HX OF 03/01/2010  . DEPRESSIVE DISORDER 09/19/2010  . DM w/o Complication Type II 05/18/2007  . GERD 05/18/2007  . HYPERLIPIDEMIA 03/17/2008  . HYPERTENSION 05/18/2007  . INSOMNIA 11/23/2008  . Loss of weight 05/30/2010  . Kidney stone   . Glaucoma   . Anxiety     History   Social History  . Marital Status: Widowed    Spouse Name: N/A    Number of Children: 3  . Years of Education: N/A   Occupational History  . retired    Social History Main Topics  . Smoking status: Never Smoker   . Smokeless tobacco: Never Used  . Alcohol Use: No  . Drug Use: No  . Sexual Activity: Not on file   Other Topics Concern  . Not on file   Social History Narrative  . No narrative on file    Past Surgical History  Procedure Laterality Date  . Abdominal hysterectomy    . Cataract extraction    . Hemorrhoid surgery    . Appendectomy    . Pacemaker insertion  09/2011  . Dilation and curettage of uterus    . Colonoscopy  06-28-10    per Dr. Arlyce Dice, diverticulosis only   . Esophagogastroduodenoscopy  05-13-11    per Dr. Arlyce Dice, normal     Family History  Problem Relation Age of Onset  . Brain cancer Father   . Colon cancer Sister   . Colitis Brother   . Heart attack Brother     Allergies  Allergen Reactions  . Amoxicillin Rash  . Penicillins Rash    Current Outpatient Prescriptions on File Prior to Visit  Medication Sig Dispense Refill  . acetaminophen (TYLENOL) 500 MG tablet Take 1,000 mg by mouth every 6 (six) hours as needed. For pain      . ALPRAZolam (XANAX) 0.5 MG tablet TAKE 1 TABLET BY MOUTH EVERY 6  HOURS AS NEEDED FOR ANXIETY  90 tablet  1  . famotidine (PEPCID) 20 MG tablet Take 1 tablet (20 mg total) by mouth 2 (two) times daily.  180 tablet  3  . furosemide (LASIX) 20 MG tablet TAKE 1 TABLET BY MOUTH TWICE A DAY TO CONTROL SWELLING  180 tablet  1  . LORazepam (ATIVAN) 0.5 MG tablet Take 1-2 tablets (0.5-1 mg total) by mouth 2 (two) times daily. 1 tablet in am and 2 in the evening  90 tablet  3  . metFORMIN (GLUCOPHAGE-XR) 500 MG 24 hr tablet TAKE 1 TABLET BY MOUTH EVERY DAY  90 tablet  1  . sertraline (ZOLOFT) 50 MG tablet Take 1 tablet (50 mg total) by mouth daily.  90 tablet  1  . simvastatin (ZOCOR) 80 MG tablet Take 0.5 tablets (40 mg total) by mouth at bedtime.  90 tablet  1  . traMADol (ULTRAM) 50 MG tablet TAKE 1 TABLET BY MOUTH EVERY 8 HOURS AS NEEDED FOR PAIN  90 tablet  1   No current facility-administered medications on file prior to visit.    BP 130/60  Pulse 88  Temp(Src) 98.3 F (36.8 C) (Oral)  Resp 20  Wt 143 lb (64.864 kg)  BMI 25.34 kg/m2  SpO2 98%       Review of Systems  Constitutional: Negative.   HENT: Negative for congestion, dental problem, hearing loss, rhinorrhea, sinus pressure, sore throat and tinnitus.   Eyes: Negative for pain, discharge and visual disturbance.  Respiratory: Negative for cough and shortness of breath.   Cardiovascular: Negative for chest pain, palpitations and leg swelling.  Gastrointestinal: Negative for nausea, vomiting, abdominal pain, diarrhea, constipation, blood in stool and abdominal distention.  Genitourinary: Negative for dysuria, urgency, frequency, hematuria, flank pain, vaginal bleeding, vaginal discharge, difficulty urinating, vaginal pain and pelvic pain.  Musculoskeletal: Positive for arthralgias and myalgias. Negative for gait problem and joint swelling.  Skin: Negative for rash.  Neurological: Negative for dizziness, syncope, speech difficulty, weakness, numbness and headaches.  Hematological: Negative for  adenopathy.  Psychiatric/Behavioral: Negative for behavioral problems, dysphoric mood and agitation. The patient is nervous/anxious.        Objective:   Physical Exam  Constitutional: She is oriented to person, place, and time. She appears well-developed and well-nourished.  HENT:  Head: Normocephalic.  Right Ear: External ear normal.  Left Ear: External ear normal.  Mouth/Throat: Oropharynx is clear and moist.  Eyes: Conjunctivae and EOM are normal. Pupils are equal, round, and reactive to light.  Neck: Normal range of motion. Neck supple. No thyromegaly present.  Cardiovascular: Normal rate, regular rhythm, normal heart sounds and intact distal pulses.   Pulmonary/Chest: Effort normal and breath sounds normal.  Abdominal: Soft. Bowel sounds are normal. She exhibits no mass. There is no tenderness.  Musculoskeletal: Normal range of motion. She exhibits edema.  +1 edema left ankle  Lymphadenopathy:    She has no cervical adenopathy.  Neurological: She is alert and oriented to person, place, and time.  Skin: Skin is warm and dry. No rash noted.  Psychiatric: She has a normal mood and affect. Her behavior is normal.          Assessment & Plan:  Diabetes mellitus. Tight control on metformin therapy alone. Will recheck in 6 months Hypertension well controlled Anxiety disorder. Recently stable.   Recheck 6 months

## 2013-06-22 ENCOUNTER — Other Ambulatory Visit: Payer: Self-pay | Admitting: Internal Medicine

## 2013-07-19 ENCOUNTER — Other Ambulatory Visit: Payer: Self-pay | Admitting: *Deleted

## 2013-07-19 MED ORDER — TRAMADOL HCL 50 MG PO TABS: ORAL_TABLET | ORAL | Status: DC

## 2013-07-19 MED ORDER — ALPRAZOLAM 0.5 MG PO TABS: ORAL_TABLET | ORAL | Status: DC

## 2013-07-19 NOTE — Telephone Encounter (Signed)
Rx faxed to [facility] for Tramadol and Alprazolam.

## 2013-07-22 ENCOUNTER — Encounter: Payer: Medicare Other | Admitting: *Deleted

## 2013-08-03 ENCOUNTER — Ambulatory Visit (INDEPENDENT_AMBULATORY_CARE_PROVIDER_SITE_OTHER): Payer: Medicare Other | Admitting: Internal Medicine

## 2013-08-03 ENCOUNTER — Encounter: Payer: Self-pay | Admitting: Internal Medicine

## 2013-08-03 VITALS — BP 147/77 | HR 63 | Ht 63.0 in | Wt 145.0 lb

## 2013-08-03 DIAGNOSIS — R079 Chest pain, unspecified: Secondary | ICD-10-CM

## 2013-08-03 DIAGNOSIS — R001 Bradycardia, unspecified: Secondary | ICD-10-CM

## 2013-08-03 DIAGNOSIS — I498 Other specified cardiac arrhythmias: Secondary | ICD-10-CM

## 2013-08-03 DIAGNOSIS — Z950 Presence of cardiac pacemaker: Secondary | ICD-10-CM

## 2013-08-03 DIAGNOSIS — I4891 Unspecified atrial fibrillation: Secondary | ICD-10-CM

## 2013-08-03 DIAGNOSIS — I10 Essential (primary) hypertension: Secondary | ICD-10-CM

## 2013-08-03 LAB — MDC_IDC_ENUM_SESS_TYPE_INCLINIC
Brady Statistic RV Percent Paced: 1 % — CL
Implantable Pulse Generator Serial Number: 123684
Lead Channel Impedance Value: 572 Ohm
Lead Channel Impedance Value: 966 Ohm
Lead Channel Pacing Threshold Pulse Width: 0.4 ms
Lead Channel Sensing Intrinsic Amplitude: 11.2 mV
Lead Channel Sensing Intrinsic Amplitude: 5.1 mV
Lead Channel Setting Pacing Amplitude: 2 V
Lead Channel Setting Pacing Pulse Width: 0.4 ms
Zone Setting Detection Interval: 375 ms

## 2013-08-03 NOTE — Assessment & Plan Note (Signed)
Despite her advanced age, she appears to be maintaining sinus rhythm very nicely. She has rare episodes of atrial fibrillation which are nonsustained.

## 2013-08-03 NOTE — Assessment & Plan Note (Signed)
She has minimal elevation in her systolic blood pressure. She states that at home her blood pressure is much better controlled.

## 2013-08-03 NOTE — Assessment & Plan Note (Signed)
Her [employer] dual-chamber pacemaker is working normally. We'll plan to recheck in several months. She has approximately 10 years of battery longevity.

## 2013-08-03 NOTE — Progress Notes (Signed)
HPI [name] returns today for followup. She is a very pleasant elderly woman with hypertension, symptomatic bradycardia, status post permanent pacemaker insertion. In the interim, she has been stable. She denies shortness of breath. Minimal peripheral edema. No syncope. Her son who is with her today notes that she gets pain under her rib cage with anxiety. She specifically denies any pain related to exertion ,activity, or movement. Allergies  Allergen Reactions  . Amoxicillin Rash  . Penicillins Rash     Current Outpatient Prescriptions  Medication Sig Dispense Refill  . acetaminophen (TYLENOL) 500 MG tablet Take 1,000 mg by mouth every 6 (six) hours as needed. For pain      . ALPRAZolam (XANAX) 0.5 MG tablet TAKE 1 TABLET BY MOUTH EVERY 6 HOURS AS NEEDED FOR ANXIETY  90 tablet  2  . famotidine (PEPCID) 20 MG tablet Take 1 tablet (20 mg total) by mouth 2 (two) times daily.  180 tablet  3  . furosemide (LASIX) 20 MG tablet TAKE 1 TABLET BY MOUTH TWICE A DAY TO CONTROL SWELLING  180 tablet  1  . LORazepam (ATIVAN) 0.5 MG tablet Take 1-2 tablets (0.5-1 mg total) by mouth 2 (two) times daily. 1 tablet in am and 2 in the evening  90 tablet  3  . metFORMIN (GLUCOPHAGE-XR) 500 MG 24 hr tablet TAKE 1 TABLET BY MOUTH EVERY DAY  90 tablet  1  . sertraline (ZOLOFT) 50 MG tablet Take 1 tablet (50 mg total) by mouth daily.  90 tablet  1  . simvastatin (ZOCOR) 80 MG tablet Take 0.5 tablets (40 mg total) by mouth at bedtime.  90 tablet  1  . traMADol (ULTRAM) 50 MG tablet TAKE 1 TABLET BY MOUTH EVERY 8 HOURS AS NEEDED FOR PAIN  90 tablet  2   No current facility-administered medications for this visit.     Past Medical History  Diagnosis Date  . ANEMIA, DEFICIENCY, HX OF 03/01/2010  . DEPRESSIVE DISORDER 09/19/2010  . DM w/o Complication Type II 05/18/2007  . GERD 05/18/2007  . HYPERLIPIDEMIA 03/17/2008  . HYPERTENSION 05/18/2007  . INSOMNIA 11/23/2008  . Loss of weight 05/30/2010  . Kidney stone    . Glaucoma   . Anxiety     ROS:   All systems reviewed and negative except as noted in the HPI.   Past Surgical History  Procedure Laterality Date  . Abdominal hysterectomy    . Cataract extraction    . Hemorrhoid surgery    . Appendectomy    . Pacemaker insertion  09/2011  . Dilation and curettage of uterus    . Colonoscopy  06-28-10    per Dr. Arlyce Dice, diverticulosis only   . Esophagogastroduodenoscopy  05-13-11    per Dr. Arlyce Dice, normal      Family History  Problem Relation Age of Onset  . Brain cancer Father   . Colon cancer Sister   . Colitis Brother   . Heart attack Brother      History   Social History  . Marital Status: Widowed    Spouse Name: N/A    Number of Children: 3  . Years of Education: N/A   Occupational History  . retired    Social History Main Topics  . Smoking status: Never Smoker   . Smokeless tobacco: Never Used  . Alcohol Use: No  . Drug Use: No  . Sexual Activity: Not on file   Other Topics Concern  . Not on file   Social  History Narrative  . No narrative on file     BP 147/77  Pulse 63  Ht 5\' 3"  (1.6 m)  Wt 145 lb (65.772 kg)  BMI 25.69 kg/m2  Physical Exam:  Well appearing elderly woman, NAD HEENT: Unremarkable Neck:  7 cm JVD, no thyromegally Lungs:  Clear with no wheezes, rales, or rhonchi. Well-healed pacemaker incision HEART:  Regular rate rhythm, no murmurs, no rubs, no clicks Abd:  soft, positive bowel sounds, no organomegally, no rebound, no guarding Ext:  2 plus pulses, no edema, no cyanosis, no clubbing Skin:  No rashes no nodules Neuro:  CN II through XII intact, motor grossly intact  DEVICE  Normal device function.  See PaceArt for details.   Assess/Plan:

## 2013-08-03 NOTE — Assessment & Plan Note (Signed)
Her current symptoms are noncardiac. I would not do any additional workup at this time.

## 2013-08-03 NOTE — Patient Instructions (Signed)
Remote monitoring is used to monitor your pacemaker from home. This monitoring reduces the number of office visits required to check your device to one time per year. It allows [place] to keep an eye on the functioning of your device to ensure it is working properly. You are scheduled for a device check from home on 11-04-2013. You may send your transmission at any time that day. If you have a wireless device, the transmission will be sent automatically. After your physician reviews your transmission, you will receive a postcard with your next transmission date.  Your physician recommends that you schedule a follow-up appointment in: 12 months

## 2013-08-06 ENCOUNTER — Telehealth: Payer: Self-pay | Admitting: Internal Medicine

## 2013-08-06 NOTE — Telephone Encounter (Signed)
Son called for pt to inform us pt's ALPRAZolam (XANAX) 0.5 MG tablet will no longer be covered next year. Pt needs to switch to something else. Next refill due 10/19/13. Son would like to get this finalized prior to this date. Advised son he will have to wait until next year's refill date when this drug will actually not be covered.

## 2013-08-19 ENCOUNTER — Ambulatory Visit: Payer: Medicare Other | Admitting: Internal Medicine

## 2013-08-30 ENCOUNTER — Ambulatory Visit: Payer: Medicare Other | Admitting: Internal Medicine

## 2013-09-03 ENCOUNTER — Telehealth: Payer: Self-pay | Admitting: Internal Medicine

## 2013-09-03 NOTE — Telephone Encounter (Signed)
Pt's son calling to report a prior authorization is required in order to refill pt's ALPRAZolam (XANAX) 0.5 MG tablet  [facility]

## 2013-09-04 ENCOUNTER — Other Ambulatory Visit: Payer: Self-pay | Admitting: Internal Medicine

## 2013-09-14 ENCOUNTER — Telehealth: Payer: Self-pay | Admitting: Internal Medicine

## 2013-09-14 MED ORDER — FUROSEMIDE 20 MG PO TABS: ORAL_TABLET | ORAL | Status: DC

## 2013-09-14 MED ORDER — ALPRAZOLAM 0.5 MG PO TABS
ORAL_TABLET | ORAL | Status: DC
Start: 2013-09-14 — End: 2013-09-30

## 2013-09-14 NOTE — Telephone Encounter (Signed)
[facility] requesting refill of furosemide (LASIX) 20 MG tablet and ALPRAZolam (XANAX) 0.5 MG tablet

## 2013-09-14 NOTE — Telephone Encounter (Signed)
Rx's faxed to [facility]. 

## 2013-09-16 ENCOUNTER — Telehealth: Payer: Self-pay | Admitting: Internal Medicine

## 2013-09-16 MED ORDER — TRAMADOL HCL 50 MG PO TABS: ORAL_TABLET | ORAL | Status: DC

## 2013-09-16 NOTE — Telephone Encounter (Signed)
[facility] pharmacy requesting refill of traMADol (ULTRAM) 50 MG tablet

## 2013-09-16 NOTE — Telephone Encounter (Signed)
Rx refill for Tramadol faxed to [facility].

## 2013-09-30 ENCOUNTER — Telehealth: Payer: Self-pay | Admitting: Internal Medicine

## 2013-09-30 MED ORDER — ALPRAZOLAM 0.5 MG PO TABS: ORAL_TABLET | ORAL | Status: DC

## 2013-09-30 NOTE — Addendum Note (Signed)
Addended by: Jimmye NormanPHANOS, Blakelyn Dinges J on: 09/30/2013 01:58 PM   Modules accepted: Orders

## 2013-09-30 NOTE — Telephone Encounter (Signed)
Pt's son Gerlene BurdockRichard called back, asked him what Rx's they need? [name] said he thinks just her Alprazolam. Told him okay will call Rx into the pharmacy and if there is any other medications you need just let the pharmacist know and they will send me a message. [name] verbalized understanding. Rx called into pharmacy.

## 2013-09-30 NOTE — Telephone Encounter (Signed)
Son would like to switch all meds back  to [facility]

## 2013-09-30 NOTE — Telephone Encounter (Signed)
Left message on voicemail to call office.  

## 2013-10-07 ENCOUNTER — Ambulatory Visit: Payer: Medicare Other | Admitting: Internal Medicine

## 2013-10-07 NOTE — Telephone Encounter (Signed)
PA request has been submitted via Cover My Meds. Reference key #[id number]

## 2013-10-08 ENCOUNTER — Ambulatory Visit (INDEPENDENT_AMBULATORY_CARE_PROVIDER_SITE_OTHER): Payer: Medicare Other | Admitting: Internal Medicine

## 2013-10-08 ENCOUNTER — Encounter: Payer: Self-pay | Admitting: Internal Medicine

## 2013-10-08 VITALS — BP 156/70 | HR 75 | Temp 98.2°F | Resp 18 | Ht 63.0 in | Wt 152.0 lb

## 2013-10-08 DIAGNOSIS — Z862 Personal history of diseases of the blood and blood-forming organs and certain disorders involving the immune mechanism: Secondary | ICD-10-CM

## 2013-10-08 DIAGNOSIS — F419 Anxiety disorder, unspecified: Secondary | ICD-10-CM

## 2013-10-08 DIAGNOSIS — E876 Hypokalemia: Secondary | ICD-10-CM

## 2013-10-08 DIAGNOSIS — E119 Type 2 diabetes mellitus without complications: Secondary | ICD-10-CM

## 2013-10-08 DIAGNOSIS — K219 Gastro-esophageal reflux disease without esophagitis: Secondary | ICD-10-CM

## 2013-10-08 DIAGNOSIS — F411 Generalized anxiety disorder: Secondary | ICD-10-CM

## 2013-10-08 DIAGNOSIS — E785 Hyperlipidemia, unspecified: Secondary | ICD-10-CM

## 2013-10-08 DIAGNOSIS — I4891 Unspecified atrial fibrillation: Secondary | ICD-10-CM

## 2013-10-08 DIAGNOSIS — I10 Essential (primary) hypertension: Secondary | ICD-10-CM

## 2013-10-08 LAB — HEMOGLOBIN A1C: HEMOGLOBIN A1C: 6.2 % (ref 4.6–6.5)

## 2013-10-08 LAB — CBC WITH DIFFERENTIAL/PLATELET
BASOS ABS: 0 10*3/uL (ref 0.0–0.1)
Basophils Relative: 0.3 % (ref 0.0–3.0)
EOS ABS: 0 10*3/uL (ref 0.0–0.7)
Eosinophils Relative: 0.3 % (ref 0.0–5.0)
HCT: 32.9 % — ABNORMAL LOW (ref 36.0–46.0)
Hemoglobin: 10.2 g/dL — ABNORMAL LOW (ref 12.0–15.0)
LYMPHS PCT: 18.9 % (ref 12.0–46.0)
Lymphs Abs: 2.1 10*3/uL (ref 0.7–4.0)
MCHC: 31.1 g/dL (ref 30.0–36.0)
MCV: 85.2 fl (ref 78.0–100.0)
MONOS PCT: 6.2 % (ref 3.0–12.0)
Monocytes Absolute: 0.7 10*3/uL (ref 0.1–1.0)
Neutro Abs: 8.2 10*3/uL — ABNORMAL HIGH (ref 1.4–7.7)
Neutrophils Relative %: 74.3 % (ref 43.0–77.0)
PLATELETS: 274 10*3/uL (ref 150.0–400.0)
RBC: 3.86 Mil/uL — ABNORMAL LOW (ref 3.87–5.11)
RDW: 14.3 % (ref 11.5–14.6)
WBC: 11.1 10*3/uL — ABNORMAL HIGH (ref 4.5–10.5)

## 2013-10-08 NOTE — Progress Notes (Signed)
Pre-visit discussion using our clinic review tool. No additional management support is needed unless otherwise documented below in the visit note.  

## 2013-10-08 NOTE — Patient Instructions (Signed)
Limit your sodium (Salt) intake    It is important that you exercise regularly, at least 20 minutes 3 to 4 times per week.  If you develop chest pain or shortness of breath seek  medical attention.   Please check your hemoglobin A1c every 3 months   

## 2013-10-08 NOTE — Progress Notes (Signed)
Subjective:    Patient ID: [name], female    DOB: [birth year], 80 y.o.   MRN: [id number]  HPI  80 year old patient who has type 2 diabetes. She has hypertension and dyslipidemia. She has anxiety disorder and really has done quite well over the past several months. There has been no recent ED visits. In the past these were quite frequent. She is accompanied by her son, Sherrine MaplesGlenn, who gives a history of some intermittent nonspecific chest pain and headaches. The patient does not recall these symptoms. She has been on alprazolam 3 times a day for her anxiety disorder and seems to have stabilized and done quite well. Hemoglobin A1c's have basically been in a nondiabetic range  Past Medical History  Diagnosis Date  . ANEMIA, DEFICIENCY, HX OF 03/01/2010  . DEPRESSIVE DISORDER 09/19/2010  . DM w/o Complication Type II 05/18/2007  . GERD 05/18/2007  . HYPERLIPIDEMIA 03/17/2008  . HYPERTENSION 05/18/2007  . INSOMNIA 11/23/2008  . Loss of weight 05/30/2010  . Kidney stone   . Glaucoma   . Anxiety     History   Social History  . Marital Status: Widowed    Spouse Name: N/A    Number of Children: 3  . Years of Education: N/A   Occupational History  . retired    Social History Main Topics  . Smoking status: Never Smoker   . Smokeless tobacco: Never Used  . Alcohol Use: No  . Drug Use: No  . Sexual Activity: Not on file   Other Topics Concern  . Not on file   Social History Narrative  . No narrative on file    Past Surgical History  Procedure Laterality Date  . Abdominal hysterectomy    . Cataract extraction    . Hemorrhoid surgery    . Appendectomy    . Pacemaker insertion  09/2011  . Dilation and curettage of uterus    . Colonoscopy  06-28-10    per Dr. Arlyce DiceKaplan, diverticulosis only   . Esophagogastroduodenoscopy  05-13-11    per Dr. Arlyce DiceKaplan, normal     Family History  Problem Relation Age of Onset  . Brain cancer Father   . Colon cancer Sister   . Colitis Brother   .  Heart attack Brother     Allergies  Allergen Reactions  . Amoxicillin Rash  . Penicillins Rash    Current Outpatient Prescriptions on File Prior to Visit  Medication Sig Dispense Refill  . acetaminophen (TYLENOL) 500 MG tablet Take 1,000 mg by mouth every 6 (six) hours as needed. For pain      . ALPRAZolam (XANAX) 0.5 MG tablet TAKE 1 TABLET BY MOUTH EVERY 6 HOURS AS NEEDED FOR ANXIETY  90 tablet  1  . famotidine (PEPCID) 20 MG tablet Take 1 tablet (20 mg total) by mouth 2 (two) times daily.  180 tablet  3  . furosemide (LASIX) 20 MG tablet TAKE 1 TABLET BY MOUTH TWICE A DAY TO CONTROL SWELLING  180 tablet  3  . LORazepam (ATIVAN) 0.5 MG tablet Take 1-2 tablets (0.5-1 mg total) by mouth 2 (two) times daily. 1 tablet in am and 2 in the evening  90 tablet  3  . metFORMIN (GLUCOPHAGE-XR) 500 MG 24 hr tablet TAKE 1 TABLET BY MOUTH EVERY DAY  90 tablet  1  . metFORMIN (GLUCOPHAGE-XR) 500 MG 24 hr tablet TAKE 1 TABLET BY MOUTH EVERY DAY  90 tablet  1  . sertraline (ZOLOFT) 50 MG tablet  Take 1 tablet (50 mg total) by mouth daily.  90 tablet  1  . simvastatin (ZOCOR) 80 MG tablet Take 0.5 tablets (40 mg total) by mouth at bedtime.  90 tablet  1  . traMADol (ULTRAM) 50 MG tablet TAKE 1 TABLET BY MOUTH EVERY 8 HOURS AS NEEDED FOR PAIN  90 tablet  5   No current facility-administered medications on file prior to visit.    BP 156/70  Pulse 75  Temp(Src) 98.2 F (36.8 C) (Oral)  Resp 18  Ht 5\' 3"  (1.6 m)  Wt 152 lb (68.947 kg)  BMI 26.93 kg/m2  SpO2 97%       Review of Systems  Constitutional: Positive for fatigue.  HENT: Negative for congestion, dental problem, hearing loss, rhinorrhea, sinus pressure, sore throat and tinnitus.   Eyes: Negative for pain, discharge and visual disturbance.  Respiratory: Negative for cough and shortness of breath.   Cardiovascular: Positive for chest pain. Negative for palpitations and leg swelling.  Gastrointestinal: Negative for nausea, vomiting,  abdominal pain, diarrhea, constipation, blood in stool and abdominal distention.  Genitourinary: Negative for dysuria, urgency, frequency, hematuria, flank pain, vaginal bleeding, vaginal discharge, difficulty urinating, vaginal pain and pelvic pain.  Musculoskeletal: Positive for back pain. Negative for arthralgias, gait problem and joint swelling.  Skin: Negative for rash.  Neurological: Positive for weakness. Negative for dizziness, syncope, speech difficulty, numbness and headaches.  Hematological: Negative for adenopathy.  Psychiatric/Behavioral: Positive for confusion. Negative for behavioral problems, dysphoric mood and agitation. The patient is nervous/anxious.        Objective:   Physical Exam  Constitutional: She is oriented to person, place, and time. She appears well-developed and well-nourished.  HENT:  Head: Normocephalic.  Right Ear: External ear normal.  Left Ear: External ear normal.  Mouth/Throat: Oropharynx is clear and moist.  Eyes: Conjunctivae and EOM are normal. Pupils are equal, round, and reactive to light.  Neck: Normal range of motion. Neck supple. No thyromegaly present.  Cardiovascular: Normal rate, regular rhythm, normal heart sounds and intact distal pulses.   Pulmonary/Chest: Effort normal and breath sounds normal.  Abdominal: Soft. Bowel sounds are normal. She exhibits no mass. There is no tenderness.  Musculoskeletal: Normal range of motion.  Lymphadenopathy:    She has no cervical adenopathy.  Neurological: She is alert and oriented to person, place, and time.  Skin: Skin is warm and dry. No rash noted.  Psychiatric: She has a normal mood and affect. Her behavior is normal.          Assessment & Plan:   Diabetes mellitus. We'll check a hemoglobin A1c and urine for microalbumin Hypertension stable Will check electrolytes. She does have a history of hypokalemia Anxiety disorder stable GERD stable History of dyslipidemia. Continue statin  therapy  Recheck 6 months or as needed Medications updated

## 2013-10-11 ENCOUNTER — Telehealth: Payer: Self-pay | Admitting: Internal Medicine

## 2013-10-11 LAB — COMPREHENSIVE METABOLIC PANEL
ALBUMIN: 4 g/dL (ref 3.5–5.2)
ALT: 8 U/L (ref 0–35)
AST: 16 U/L (ref 0–37)
Alkaline Phosphatase: 40 U/L (ref 39–117)
BUN: 28 mg/dL — ABNORMAL HIGH (ref 6–23)
CALCIUM: 9.4 mg/dL (ref 8.4–10.5)
CHLORIDE: 101 meq/L (ref 96–112)
CO2: 27 meq/L (ref 19–32)
Creatinine, Ser: 1.3 mg/dL — ABNORMAL HIGH (ref 0.4–1.2)
GFR: 39.63 mL/min — AB (ref >60.00)
GLUCOSE: 127 mg/dL — AB (ref 70–99)
POTASSIUM: 4.9 meq/L (ref 3.5–5.1)
SODIUM: 139 meq/L (ref 135–145)
TOTAL PROTEIN: 7.4 g/dL (ref 6.0–8.3)
Total Bilirubin: 0.5 mg/dL (ref 0.3–1.2)

## 2013-10-11 LAB — MICROALBUMIN / CREATININE URINE RATIO
Creatinine,U: 71.9 mg/dL
MICROALB UR: 1.8 mg/dL (ref 0.0–1.9)
Microalb Creat Ratio: 2.5 mg/g (ref 0.0–30.0)

## 2013-10-11 LAB — TSH: TSH: 2.85 u[IU]/mL (ref 0.35–5.50)

## 2013-10-11 NOTE — Telephone Encounter (Signed)
Relevant patient education mailed to patient.  

## 2013-11-04 ENCOUNTER — Encounter: Payer: Self-pay | Admitting: Internal Medicine

## 2013-11-04 ENCOUNTER — Ambulatory Visit (INDEPENDENT_AMBULATORY_CARE_PROVIDER_SITE_OTHER): Payer: Medicare Other | Admitting: *Deleted

## 2013-11-04 DIAGNOSIS — I498 Other specified cardiac arrhythmias: Secondary | ICD-10-CM

## 2013-11-04 DIAGNOSIS — R001 Bradycardia, unspecified: Secondary | ICD-10-CM

## 2013-11-04 LAB — MDC_IDC_ENUM_SESS_TYPE_REMOTE
Brady Statistic RA Percent Paced: 45 %
Brady Statistic RV Percent Paced: 0 %
Lead Channel Impedance Value: 906 Ohm
Lead Channel Sensing Intrinsic Amplitude: 4.7 mV
Lead Channel Setting Pacing Amplitude: 2 V
Lead Channel Setting Pacing Amplitude: 2.4 V
Lead Channel Setting Sensing Sensitivity: 2.5 mV
MDC IDC MSMT LEADCHNL RA IMPEDANCE VALUE: 580 Ohm
MDC IDC MSMT LEADCHNL RV SENSING INTR AMPL: 11.3 mV
MDC IDC PG SERIAL: 123684
MDC IDC SET LEADCHNL RV PACING PULSEWIDTH: 0.4 ms
MDC IDC SET ZONE DETECTION INTERVAL: 375 ms

## 2013-12-14 ENCOUNTER — Ambulatory Visit (INDEPENDENT_AMBULATORY_CARE_PROVIDER_SITE_OTHER): Payer: Medicare Other | Admitting: Internal Medicine

## 2013-12-14 ENCOUNTER — Encounter: Payer: Self-pay | Admitting: Internal Medicine

## 2013-12-14 VITALS — BP 140/70 | HR 69 | Temp 98.8°F | Resp 20 | Ht 63.0 in | Wt 159.0 lb

## 2013-12-14 DIAGNOSIS — E119 Type 2 diabetes mellitus without complications: Secondary | ICD-10-CM

## 2013-12-14 DIAGNOSIS — M79609 Pain in unspecified limb: Secondary | ICD-10-CM

## 2013-12-14 DIAGNOSIS — R52 Pain, unspecified: Secondary | ICD-10-CM

## 2013-12-14 DIAGNOSIS — I10 Essential (primary) hypertension: Secondary | ICD-10-CM

## 2013-12-14 DIAGNOSIS — M79606 Pain in leg, unspecified: Secondary | ICD-10-CM

## 2013-12-14 MED ORDER — FUROSEMIDE 20 MG PO TABS: ORAL_TABLET | ORAL | Status: DC

## 2013-12-14 MED ORDER — FUROSEMIDE 20 MG PO TABS: 20.0000 mg | ORAL_TABLET | Freq: Every day | ORAL | Status: DC

## 2013-12-14 NOTE — Progress Notes (Signed)
Pre-visit discussion using our clinic review tool. No additional management support is needed unless otherwise documented below in the visit note.  

## 2013-12-14 NOTE — Patient Instructions (Signed)
Decrease furosemide to once daily in the morning if needed for swelling  Limit your sodium (Salt) intake   Please check your hemoglobin A1c every 3 months

## 2013-12-14 NOTE — Progress Notes (Signed)
Subjective:    Patient ID: [name], female    DOB: April 08, 1923, 80 y.o.   MRN: [id number]  HPI  [age] year old patient who is seen today in followup.  She has history of type 2 diabetes, which has been well controlled on metformin therapy. Medical problems include hypertension, dyslipidemia. She has a history of panic disorder with chronic anxiety.  She remains on a 3 times a day regimen of alprazolam.  She is accompanied by her son with complaints of leg pain, headaches, and some memory difficulties.  These complaints are fairly chronic. She does describe some auditory hallucination.  She states at times.  She feels that a man is singing.  Laboratory screen was obtained in February.  That was unremarkable except for an increased creatinine to 1 point 3 A head CT in the past revealed microvascular changes.  No history of recent head,  Past Medical History  Diagnosis Date  . ANEMIA, DEFICIENCY, HX OF 03/01/2010  . DEPRESSIVE DISORDER 09/19/2010  . DM w/o Complication Type II 05/18/2007  . GERD 05/18/2007  . HYPERLIPIDEMIA 03/17/2008  . HYPERTENSION 05/18/2007  . INSOMNIA 11/23/2008  . Loss of weight 05/30/2010  . Kidney stone   . Glaucoma   . Anxiety     History   Social History  . Marital Status: Widowed    Spouse Name: N/A    Number of Children: 3  . Years of Education: N/A   Occupational History  . retired    Social History Main Topics  . Smoking status: Never Smoker   . Smokeless tobacco: Never Used  . Alcohol Use: No  . Drug Use: No  . Sexual Activity: Not on file   Other Topics Concern  . Not on file   Social History Narrative  . No narrative on file    Past Surgical History  Procedure Laterality Date  . Abdominal hysterectomy    . Cataract extraction    . Hemorrhoid surgery    . Appendectomy    . Pacemaker insertion  09/2011  . Dilation and curettage of uterus    . Colonoscopy  06-28-10    per Dr. Arlyce DiceKaplan, diverticulosis only   .  Esophagogastroduodenoscopy  05-13-11    per Dr. Arlyce DiceKaplan, normal     Family History  Problem Relation Age of Onset  . Brain cancer Father   . Colon cancer Sister   . Colitis Brother   . Heart attack Brother     Allergies  Allergen Reactions  . Amoxicillin Rash  . Penicillins Rash    Current Outpatient Prescriptions on File Prior to Visit  Medication Sig Dispense Refill  . acetaminophen (TYLENOL) 500 MG tablet Take 1,000 mg by mouth every 6 (six) hours as needed. For pain      . ALPRAZolam (XANAX) 0.5 MG tablet TAKE 1 TABLET BY MOUTH EVERY 6 HOURS AS NEEDED FOR ANXIETY  90 tablet  1  . famotidine (PEPCID) 20 MG tablet Take 1 tablet (20 mg total) by mouth 2 (two) times daily.  180 tablet  3  . furosemide (LASIX) 20 MG tablet TAKE 1 TABLET BY MOUTH TWICE A DAY TO CONTROL SWELLING  180 tablet  3  . metFORMIN (GLUCOPHAGE-XR) 500 MG 24 hr tablet TAKE 1 TABLET BY MOUTH EVERY DAY  90 tablet  1  . sertraline (ZOLOFT) 50 MG tablet Take 1 tablet (50 mg total) by mouth daily.  90 tablet  1  . simvastatin (ZOCOR) 80 MG tablet Take 0.5 tablets (  40 mg total) by mouth at bedtime.  90 tablet  1  . traMADol (ULTRAM) 50 MG tablet TAKE 1 TABLET BY MOUTH EVERY 8 HOURS AS NEEDED FOR PAIN  90 tablet  5  . LORazepam (ATIVAN) 0.5 MG tablet Take 1-2 tablets (0.5-1 mg total) by mouth 2 (two) times daily. 1 tablet in am and 2 in the evening  90 tablet  3   No current facility-administered medications on file prior to visit.    BP 140/70  Pulse 69  Temp(Src) 98.8 F (37.1 C) (Oral)  Resp 20  Ht 5\' 3"  (1.6 m)  Wt 159 lb (72.122 kg)  BMI 28.17 kg/m2  SpO2 98%       Review of Systems  Constitutional: Negative.   HENT: Negative for congestion, dental problem, hearing loss, rhinorrhea, sinus pressure, sore throat and tinnitus.   Eyes: Negative for pain, discharge and visual disturbance.  Respiratory: Negative for cough and shortness of breath.   Cardiovascular: Negative for chest pain, palpitations  and leg swelling.  Gastrointestinal: Negative for nausea, vomiting, abdominal pain, diarrhea, constipation, blood in stool and abdominal distention.  Genitourinary: Negative for dysuria, urgency, frequency, hematuria, flank pain, vaginal bleeding, vaginal discharge, difficulty urinating, vaginal pain and pelvic pain.  Musculoskeletal: Positive for arthralgias and gait problem. Negative for joint swelling.  Skin: Negative for rash.  Neurological: Positive for headaches. Negative for dizziness, syncope, speech difficulty, weakness and numbness.  Hematological: Negative for adenopathy.  Psychiatric/Behavioral: Negative for behavioral problems, dysphoric mood and agitation. The patient is nervous/anxious.        Objective:   Physical Exam  Constitutional: She is oriented to person, place, and time. She appears well-developed and well-nourished.  HENT:  Head: Normocephalic.  Right Ear: External ear normal.  Left Ear: External ear normal.  Mouth/Throat: Oropharynx is clear and moist.  Eyes: Conjunctivae and EOM are normal. Pupils are equal, round, and reactive to light.  Neck: Normal range of motion. Neck supple. No thyromegaly present.  Cardiovascular: Normal rate, regular rhythm and normal heart sounds.   Pulmonary/Chest: Effort normal and breath sounds normal.  Abdominal: Soft. Bowel sounds are normal. She exhibits no mass. There is no tenderness.  Musculoskeletal: Normal range of motion.  Lymphadenopathy:    She has no cervical adenopathy.  Neurological: She is alert and oriented to person, place, and time.  Skin: Skin is warm and dry. No rash noted.  Psychiatric: She has a normal mood and affect. Her behavior is normal.          Assessment & Plan:   Diabetes mellitus.  Appears to be well-controlled Hypertension.  Recent creatinine increased to 1 point 3.  Will decrease furosemide to a once daily regimen Dyslipidemia Confusion.  Multifactorial.  Exam is nonfocal Leg  pain   Recheck 3 months

## 2013-12-16 ENCOUNTER — Encounter: Payer: Self-pay | Admitting: *Deleted

## 2013-12-17 ENCOUNTER — Other Ambulatory Visit: Payer: Self-pay | Admitting: Internal Medicine

## 2013-12-19 ENCOUNTER — Other Ambulatory Visit: Payer: Self-pay | Admitting: Internal Medicine

## 2013-12-28 ENCOUNTER — Telehealth: Payer: Self-pay

## 2013-12-28 NOTE — Telephone Encounter (Signed)
Relevant patient education mailed to patient.  

## 2014-01-12 ENCOUNTER — Telehealth: Payer: Self-pay | Admitting: Internal Medicine

## 2014-01-12 MED ORDER — METFORMIN HCL ER 500 MG PO TB24: ORAL_TABLET | ORAL | Status: DC

## 2014-01-12 MED ORDER — FUROSEMIDE 20 MG PO TABS: 20.0000 mg | ORAL_TABLET | Freq: Every day | ORAL | Status: DC

## 2014-01-12 NOTE — Telephone Encounter (Signed)
90 day re-fill request on furosemide (LASIX) 20 MG tablet [id number] - [place], [place] - [address]

## 2014-01-12 NOTE — Telephone Encounter (Signed)
Rxs sent

## 2014-01-12 NOTE — Telephone Encounter (Signed)
[id number] - [place],  - [address] is requesting 90 day re-fill on metFORMIN (GLUCOPHAGE-XR) 500 MG 24 hr tablet

## 2014-02-10 ENCOUNTER — Ambulatory Visit (INDEPENDENT_AMBULATORY_CARE_PROVIDER_SITE_OTHER): Payer: Medicare Other | Admitting: *Deleted

## 2014-02-10 ENCOUNTER — Encounter: Payer: Self-pay | Admitting: Internal Medicine

## 2014-02-10 DIAGNOSIS — I4891 Unspecified atrial fibrillation: Secondary | ICD-10-CM

## 2014-02-10 NOTE — Progress Notes (Signed)
Remote pacemaker transmission.   

## 2014-02-24 ENCOUNTER — Encounter: Payer: Self-pay | Admitting: Internal Medicine

## 2014-02-24 ENCOUNTER — Ambulatory Visit (INDEPENDENT_AMBULATORY_CARE_PROVIDER_SITE_OTHER): Payer: Medicare Other | Admitting: Internal Medicine

## 2014-02-24 VITALS — BP 146/70 | HR 60 | Temp 98.5°F | Resp 20 | Ht 63.0 in | Wt 160.0 lb

## 2014-02-24 DIAGNOSIS — K219 Gastro-esophageal reflux disease without esophagitis: Secondary | ICD-10-CM

## 2014-02-24 DIAGNOSIS — F411 Generalized anxiety disorder: Secondary | ICD-10-CM

## 2014-02-24 DIAGNOSIS — R1013 Epigastric pain: Secondary | ICD-10-CM

## 2014-02-24 DIAGNOSIS — E119 Type 2 diabetes mellitus without complications: Secondary | ICD-10-CM

## 2014-02-24 DIAGNOSIS — F419 Anxiety disorder, unspecified: Secondary | ICD-10-CM

## 2014-02-24 DIAGNOSIS — I10 Essential (primary) hypertension: Secondary | ICD-10-CM

## 2014-02-24 NOTE — Progress Notes (Signed)
Pre-visit discussion using our clinic review tool. No additional management support is needed unless otherwise documented below in the visit note.  

## 2014-02-24 NOTE — Progress Notes (Signed)
Subjective:    Patient ID: Cindy ComasLucille M Cake, female    DOB: [birth year], 80 y.o.   MRN: [id number]  HPI  80 year old patient who is seen today for followup.  She is covered by her son with a history of epigastric pain of 3-4 days duration.  This has been a intermittent problem in the past and has been thoroughly evaluated.  She has type 2 diabetes and is on low-dose metformin therapy.  She has hypertension and dyslipidemia.  In general doing quite well. She has chronic anxiety and has had 4 ED visits over the past 6 months  Past Medical History  Diagnosis Date  . ANEMIA, DEFICIENCY, HX OF 03/01/2010  . DEPRESSIVE DISORDER 09/19/2010  . DM w/o Complication Type II 05/18/2007  . GERD 05/18/2007  . HYPERLIPIDEMIA 03/17/2008  . HYPERTENSION 05/18/2007  . INSOMNIA 11/23/2008  . Loss of weight 05/30/2010  . Kidney stone   . Glaucoma   . Anxiety     History   Social History  . Marital Status: Widowed    Spouse Name: N/A    Number of Children: 3  . Years of Education: N/A   Occupational History  . retired    Social History Main Topics  . Smoking status: Never Smoker   . Smokeless tobacco: Never Used  . Alcohol Use: No  . Drug Use: No  . Sexual Activity: Not on file   Other Topics Concern  . Not on file   Social History Narrative  . No narrative on file    Past Surgical History  Procedure Laterality Date  . Abdominal hysterectomy    . Cataract extraction    . Hemorrhoid surgery    . Appendectomy    . Pacemaker insertion  09/2011  . Dilation and curettage of uterus    . Colonoscopy  06-28-10    per Dr. Arlyce DiceKaplan, diverticulosis only   . Esophagogastroduodenoscopy  05-13-11    per Dr. Arlyce DiceKaplan, normal     Family History  Problem Relation Age of Onset  . Brain cancer Father   . Colon cancer Sister   . Colitis Brother   . Heart attack Brother     Allergies  Allergen Reactions  . Amoxicillin Rash  . Penicillins Rash    Current Outpatient Prescriptions on File Prior to  Visit  Medication Sig Dispense Refill  . acetaminophen (TYLENOL) 500 MG tablet Take 1,000 mg by mouth every 6 (six) hours as needed. For pain      . ALPRAZolam (XANAX) 0.5 MG tablet TAKE 1 TABLET BY MOUTH EVERY 6 HOURS AS NEEDED FOR ANXIETY  90 tablet  1  . famotidine (PEPCID) 20 MG tablet Take 1 tablet (20 mg total) by mouth 2 (two) times daily.  180 tablet  3  . furosemide (LASIX) 20 MG tablet Take 1 tablet (20 mg total) by mouth daily.  90 tablet  3  . LORazepam (ATIVAN) 0.5 MG tablet Take 1-2 tablets (0.5-1 mg total) by mouth 2 (two) times daily. 1 tablet in am and 2 in the evening  90 tablet  3  . metFORMIN (GLUCOPHAGE-XR) 500 MG 24 hr tablet TAKE 1 TABLET BY MOUTH EVERY DAY  90 tablet  1  . sertraline (ZOLOFT) 50 MG tablet Take 1 tablet (50 mg total) by mouth daily.  90 tablet  1  . simvastatin (ZOCOR) 80 MG tablet Take 0.5 tablets (40 mg total) by mouth at bedtime.  90 tablet  1  . traMADol (ULTRAM) 50 MG  tablet TAKE 1 TABLET BY MOUTH EVERY 8 HOURS AS NEEDED FOR PAIN  90 tablet  5   No current facility-administered medications on file prior to visit.    BP 146/70  Pulse 60  Temp(Src) 98.5 F (36.9 C) (Oral)  Resp 20  Ht 5\' 3"  (1.6 m)  Wt 160 lb (72.576 kg)  BMI 28.35 kg/m2  SpO2 96%       Review of Systems  Constitutional: Negative.   HENT: Negative for congestion, dental problem, hearing loss, rhinorrhea, sinus pressure, sore throat and tinnitus.   Eyes: Negative for pain, discharge and visual disturbance.  Respiratory: Negative for cough and shortness of breath.   Cardiovascular: Negative for chest pain, palpitations and leg swelling.  Gastrointestinal: Positive for abdominal pain. Negative for nausea, vomiting, diarrhea, constipation, blood in stool and abdominal distention.  Genitourinary: Negative for dysuria, urgency, frequency, hematuria, flank pain, vaginal bleeding, vaginal discharge, difficulty urinating, vaginal pain and pelvic pain.  Musculoskeletal: Negative  for arthralgias, gait problem and joint swelling.  Skin: Negative for rash.  Neurological: Negative for dizziness, syncope, speech difficulty, weakness, numbness and headaches.  Hematological: Negative for adenopathy.  Psychiatric/Behavioral: Negative for behavioral problems, dysphoric mood and agitation. The patient is nervous/anxious.        Objective:   Physical Exam  Constitutional: She is oriented to person, place, and time. She appears well-developed and well-nourished. No distress.  Confused Unclear why she is here for evaluation today Vital signs stable afebrile   HENT:  Head: Normocephalic.  Right Ear: External ear normal.  Left Ear: External ear normal.  Mouth/Throat: Oropharynx is clear and moist.  Eyes: Conjunctivae and EOM are normal. Pupils are equal, round, and reactive to light.  Neck: Normal range of motion. Neck supple. No thyromegaly present.  Cardiovascular: Normal rate, regular rhythm, normal heart sounds and intact distal pulses.   Pulmonary/Chest: Effort normal and breath sounds normal. No respiratory distress. She has no wheezes.  Abdominal: Soft. Bowel sounds are normal. She exhibits no mass. There is no tenderness. There is no rebound and no guarding.  Musculoskeletal: Normal range of motion.  Lymphadenopathy:    She has no cervical adenopathy.  Neurological: She is alert and oriented to person, place, and time.  Skin: Skin is warm and dry. No rash noted.  Psychiatric: She has a normal mood and affect. Her behavior is normal.          Assessment & Plan:   Recurrent epigastric pain.  Unremarkable clinical examination.  We'll observe and continue present regimen   Diabetes mellitus stable hypertension, well controlled.  Anxiety disorder  Recheck 4 months

## 2014-02-24 NOTE — Patient Instructions (Signed)
Avoids foods high in acid such as tomatoes citrus juices, and spicy foods.  Avoid eating within two hours of lying down or before exercising.  Do not overheat.  Try smaller more frequent meals.  If symptoms persist, elevate the head of her bed 12 inches while sleeping.  Limit your sodium (Salt) intake  Return in 4 months for follow-up

## 2014-02-25 ENCOUNTER — Other Ambulatory Visit: Payer: Self-pay | Admitting: Internal Medicine

## 2014-03-07 LAB — MDC_IDC_ENUM_SESS_TYPE_REMOTE
Battery Remaining Longevity: 9
Implantable Pulse Generator Serial Number: 123684
Lead Channel Impedance Value: 881 Ohm
Lead Channel Sensing Intrinsic Amplitude: 10.8 mV
Lead Channel Sensing Intrinsic Amplitude: 4.3 mV
Lead Channel Setting Pacing Amplitude: 2 V
Lead Channel Setting Sensing Sensitivity: 2.5 mV
MDC IDC MSMT LEADCHNL RA IMPEDANCE VALUE: 594 Ohm
MDC IDC SET LEADCHNL RV PACING AMPLITUDE: 2.4 V
MDC IDC SET LEADCHNL RV PACING PULSEWIDTH: 0.4 ms
Zone Setting Detection Interval: 375 ms

## 2014-03-16 ENCOUNTER — Encounter: Payer: Self-pay | Admitting: Internal Medicine

## 2014-03-17 ENCOUNTER — Ambulatory Visit: Payer: Medicare Other | Admitting: Internal Medicine

## 2014-03-21 ENCOUNTER — Encounter: Payer: Self-pay | Admitting: Cardiology

## 2014-03-29 ENCOUNTER — Other Ambulatory Visit: Payer: Self-pay | Admitting: Internal Medicine

## 2014-04-27 ENCOUNTER — Encounter: Payer: Self-pay | Admitting: Internal Medicine

## 2014-05-03 ENCOUNTER — Encounter: Payer: Self-pay | Admitting: Internal Medicine

## 2014-05-03 ENCOUNTER — Ambulatory Visit (INDEPENDENT_AMBULATORY_CARE_PROVIDER_SITE_OTHER): Payer: Medicare Other | Admitting: Internal Medicine

## 2014-05-03 VITALS — BP 140/68 | HR 76 | Temp 98.4°F | Resp 20 | Ht 63.0 in | Wt 162.0 lb

## 2014-05-03 DIAGNOSIS — E876 Hypokalemia: Secondary | ICD-10-CM

## 2014-05-03 DIAGNOSIS — F419 Anxiety disorder, unspecified: Secondary | ICD-10-CM

## 2014-05-03 DIAGNOSIS — R52 Pain, unspecified: Secondary | ICD-10-CM

## 2014-05-03 DIAGNOSIS — E119 Type 2 diabetes mellitus without complications: Secondary | ICD-10-CM

## 2014-05-03 DIAGNOSIS — F411 Generalized anxiety disorder: Secondary | ICD-10-CM

## 2014-05-03 DIAGNOSIS — I10 Essential (primary) hypertension: Secondary | ICD-10-CM

## 2014-05-03 NOTE — Patient Instructions (Signed)
Limit your sodium (Salt) intake    It is important that you exercise regularly, at least 20 minutes 3 to 4 times per week.  If you develop chest pain or shortness of breath seek  medical attention.  Return in 6 months for follow-up  

## 2014-05-03 NOTE — Progress Notes (Signed)
Pre visit review using our clinic review tool, if applicable. No additional management support is needed unless otherwise documented below in the visit note. 

## 2014-05-03 NOTE — Progress Notes (Signed)
Subjective:    Patient ID: [name], female    DOB: [birth year], 80 y.o.   MRN: [id number]  HPI  80 year old patient who is in today for followup.  She has a history of type 2 diabetes which has been managed on metformin therapy.  She has anxiety, depression, history of hypertension, and generally has done fairly well over the past few months. She does have a history of paroxysmal atrial fibrillation , but did too high risk of falls felt not to be a candidate for anticoagulation.  She has a history of hypertension.  She has anxiety and mild cognitive dysfunction  Past Medical History  Diagnosis Date  . ANEMIA, DEFICIENCY, HX OF 03/01/2010  . DEPRESSIVE DISORDER 09/19/2010  . DM w/o Complication Type II 05/18/2007  . GERD 05/18/2007  . HYPERLIPIDEMIA 03/17/2008  . HYPERTENSION 05/18/2007  . INSOMNIA 11/23/2008  . Loss of weight 05/30/2010  . Kidney stone   . Glaucoma   . Anxiety     History   Social History  . Marital Status: Widowed    Spouse Name: N/A    Number of Children: 3  . Years of Education: N/A   Occupational History  . retired    Social History Main Topics  . Smoking status: Never Smoker   . Smokeless tobacco: Never Used  . Alcohol Use: No  . Drug Use: No  . Sexual Activity: Not on file   Other Topics Concern  . Not on file   Social History Narrative  . No narrative on file    Past Surgical History  Procedure Laterality Date  . Abdominal hysterectomy    . Cataract extraction    . Hemorrhoid surgery    . Appendectomy    . Pacemaker insertion  09/2011  . Dilation and curettage of uterus    . Colonoscopy  06-28-10    per Dr. Arlyce Dice, diverticulosis only   . Esophagogastroduodenoscopy  05-13-11    per Dr. Arlyce Dice, normal     Family History  Problem Relation Age of Onset  . Brain cancer Father   . Colon cancer Sister   . Colitis Brother   . Heart attack Brother     Allergies  Allergen Reactions  . Amoxicillin Rash  . Penicillins Rash     Current Outpatient Prescriptions on File Prior to Visit  Medication Sig Dispense Refill  . acetaminophen (TYLENOL) 500 MG tablet Take 1,000 mg by mouth every 6 (six) hours as needed. For pain      . ALPRAZolam (XANAX) 0.5 MG tablet TAKE 1 TABLET BY MOUTH EVERY 6 HOURS AS NEEDED  90 tablet  2  . famotidine (PEPCID) 20 MG tablet Take 1 tablet (20 mg total) by mouth 2 (two) times daily.  180 tablet  3  . furosemide (LASIX) 20 MG tablet Take 1 tablet (20 mg total) by mouth daily.  90 tablet  3  . LORazepam (ATIVAN) 0.5 MG tablet Take 1-2 tablets (0.5-1 mg total) by mouth 2 (two) times daily. 1 tablet in am and 2 in the evening  90 tablet  3  . metFORMIN (GLUCOPHAGE-XR) 500 MG 24 hr tablet TAKE 1 TABLET BY MOUTH EVERY DAY  90 tablet  1  . sertraline (ZOLOFT) 50 MG tablet Take 1 tablet (50 mg total) by mouth daily.  90 tablet  1  . simvastatin (ZOCOR) 80 MG tablet Take 0.5 tablets (40 mg total) by mouth at bedtime.  90 tablet  1  . traMADol (ULTRAM)  50 MG tablet TAKE 1 TABLET BY MOUTH EVERY 8 HOURS AS NEEDED FOR PAIN  90 tablet  2   No current facility-administered medications on file prior to visit.    BP 161/68  Pulse 76  Temp(Src) 98.4 F (36.9 C) (Oral)  Resp 20  Ht  (1.6 m)  Wt 162 lb (73.483 kg)  BMI 28.70 kg/m2     Review of Systems  Constitutional: Negative.   HENT: Negative for congestion, dental problem, hearing loss, rhinorrhea, sinus pressure, sore throat and tinnitus.   Eyes: Negative for pain, discharge and visual disturbance.  Respiratory: Negative for cough and shortness of breath.   Cardiovascular: Negative for chest pain, palpitations and leg swelling.  Gastrointestinal: Negative for nausea, vomiting, abdominal pain, diarrhea, constipation, blood in stool and abdominal distention.  Genitourinary: Negative for dysuria, urgency, frequency, hematuria, flank pain, vaginal bleeding, vaginal discharge, difficulty urinating, vaginal pain and pelvic pain.   Musculoskeletal: Negative for arthralgias, gait problem and joint swelling.  Skin: Negative for rash.  Neurological: Negative for dizziness, syncope, speech difficulty, weakness, numbness and headaches.  Hematological: Negative for adenopathy.  Psychiatric/Behavioral: Positive for behavioral problems. Negative for dysphoric mood and agitation. The patient is nervous/anxious.        Objective:   Physical Exam  Constitutional: She is oriented to person, place, and time. She appears well-developed and well-nourished.  HENT:  Head: Normocephalic.  Right Ear: External ear normal.  Left Ear: External ear normal.  Mouth/Throat: Oropharynx is clear and moist.  Eyes: Conjunctivae and EOM are normal. Pupils are equal, round, and reactive to light.  Neck: Normal range of motion. Neck supple. No thyromegaly present.  Cardiovascular: Normal rate, regular rhythm, normal heart sounds and intact distal pulses.   Pulmonary/Chest: Effort normal and breath sounds normal.  Abdominal: Soft. Bowel sounds are normal. She exhibits no mass. There is no tenderness.  Musculoskeletal: Normal range of motion.  Lymphadenopathy:    She has no cervical adenopathy.  Neurological: She is alert and oriented to person, place, and time.  Skin: Skin is warm and dry. No rash noted.  Psychiatric: She has a normal mood and affect. Her behavior is normal.          Assessment & Plan:   Diabetes mellitus.  We'll check a hemoglobin A1c continue metformin therapy Hypertension.  Blood pressure 140/72 History of hypokalemia.  Will check electrolytes Anxiety disorder, stable  We'll check lab  Recheck 6 months

## 2014-05-04 LAB — BASIC METABOLIC PANEL
BUN: 22 mg/dL (ref 6–23)
CO2: 28 meq/L (ref 19–32)
Calcium: 9.5 mg/dL (ref 8.4–10.5)
Chloride: 104 mEq/L (ref 96–112)
Creatinine, Ser: 1.3 mg/dL — ABNORMAL HIGH (ref 0.4–1.2)
GFR: 42.88 mL/min — AB (ref >60.00)
GLUCOSE: 103 mg/dL — AB (ref 70–99)
POTASSIUM: 5 meq/L (ref 3.5–5.1)
SODIUM: 141 meq/L (ref 135–145)

## 2014-05-04 LAB — HEMOGLOBIN A1C: Hgb A1c MFr Bld: 6.2 % (ref 4.6–6.5)

## 2014-05-12 ENCOUNTER — Other Ambulatory Visit: Payer: Self-pay | Admitting: Internal Medicine

## 2014-05-16 DIAGNOSIS — I498 Other specified cardiac arrhythmias: Secondary | ICD-10-CM

## 2014-05-16 LAB — MDC_IDC_ENUM_SESS_TYPE_REMOTE
Brady Statistic RV Percent Paced: 0 %
Implantable Pulse Generator Serial Number: 123684
Lead Channel Impedance Value: 637 Ohm
Lead Channel Impedance Value: 854 Ohm
Lead Channel Sensing Intrinsic Amplitude: 4.3 mV
MDC IDC MSMT LEADCHNL RV SENSING INTR AMPL: 10.4 mV
MDC IDC STAT BRADY RA PERCENT PACED: 45 %

## 2014-05-17 ENCOUNTER — Ambulatory Visit (INDEPENDENT_AMBULATORY_CARE_PROVIDER_SITE_OTHER): Payer: Medicare Other | Admitting: *Deleted

## 2014-05-17 DIAGNOSIS — R001 Bradycardia, unspecified: Secondary | ICD-10-CM

## 2014-05-17 NOTE — Progress Notes (Signed)
Remote pacemaker transmission.   

## 2014-05-26 ENCOUNTER — Telehealth: Payer: Self-pay | Admitting: Internal Medicine

## 2014-05-26 NOTE — Telephone Encounter (Signed)
Discontinue alprazolam.  Patient does not require both medications

## 2014-05-26 NOTE — Telephone Encounter (Signed)
Ok to substitute lorazepam

## 2014-05-26 NOTE — Telephone Encounter (Signed)
Please advise 

## 2014-05-26 NOTE — Telephone Encounter (Signed)
Son states pt's  ALPRAZolam (XANAX) 0.5 MG tablet Is not covered by pt's insurance co this year, not in her formulary. Would like to know if there is a substitute.

## 2014-05-26 NOTE — Telephone Encounter (Signed)
Dr. Kirtland Bouchard, pt is already taking Lorazepam 0.5 mg, one tablet in am and 2 tablets in pm. Please advise

## 2014-05-27 MED ORDER — LORAZEPAM 0.5 MG PO TABS: 0.5000 mg | ORAL_TABLET | Freq: Three times a day (TID) | ORAL | Status: DC | PRN

## 2014-05-27 NOTE — Telephone Encounter (Signed)
[name] called back, he checked with insurance and Lorazepam is covered. Told him I will call Rx in this afternoon just need to clarify directions with Dr. Kirtland Bouchard. Asked him if pt was taking Alprazolam three times a day. He said yes. Told him okay will call Rx in later today. [name] verbalized understanding.

## 2014-05-27 NOTE — Telephone Encounter (Signed)
Discussed with Dr. Kirtland Bouchard, okay for Lorazepam 0.5 mg one tablet three times a day as needed. Rx called into pharmacy.

## 2014-05-27 NOTE — Telephone Encounter (Signed)
Spoke to [name] pt's son told him to discontinue Alprazolam per Dr. Kirtland Bouchard pt does not require both medications. [name] said pt is only taking Alprazolam, not taking Lorazepam. Asked if covered by insurance told him I am not sure he would have to check. [name] said he will call and check with insurance and get back to me. Told him okay.

## 2014-05-27 NOTE — Telephone Encounter (Signed)
[name] notified Rx called into pharmacy.

## 2014-06-02 ENCOUNTER — Other Ambulatory Visit: Payer: Self-pay | Admitting: Internal Medicine

## 2014-06-14 ENCOUNTER — Telehealth: Payer: Self-pay | Admitting: Internal Medicine

## 2014-06-14 NOTE — Telephone Encounter (Signed)
Patient Information:  Caller Name: Gerlene BurdockRichard  Phone: [phone]  Patient: [name], [name]  Gender: Female  DOB: 1923/05/01  Age: 80 Years  PCP: Eleonore ChiquitoKwiatkowski, Peter (Family Practice > 6461yrs old)  Office Follow Up:  Does the office need to follow up with this patient?: No  Instructions For The Office: N/A  RN Note:  Attempted to schedule an appt for tomorrow but son wanst to call office back in am to schedule an appt to see if they can get it close to when she gets her flu shot.  Symptoms  Reason For Call & Symptoms: Calling about occas lower abd pain, occas headaches-->son thinks she is afraid because her sister just passed away. She is also having trouble with short term memory. Has discussed abd pain and headaches with PCP but son is concerned because he doesn't know what else to do for her. Wakes son up early every morning. Had BM this am and not having trouble with constipation. Has an appt to get flu shot at the office tomorrow and and appt with PCP on 11/01/14.  Reviewed Health History In EMR: Yes  Reviewed Medications In EMR: Yes  Reviewed Allergies In EMR: Yes  Reviewed Surgeries / Procedures: Yes  Date of Onset of Symptoms: 06/12/2014  Treatments Tried: Peptobismol,Tylenol,Tramadol  Treatments Tried Worked: No  Any Fever: Yes  Fever Taken: Oral  Fever Time Of Reading: 16:40:24  Fever Last Reading: 99.2  Guideline(s) Used:  Abdominal Pain - Female  Disposition Per Guideline:   See Today in Office  Reason For Disposition Reached:   Moderate or mild pain that comes and goes (cramps) lasts > 24 hours  Advice Given:  Call Back If:  You become worse.  Patient Will Follow Care Advice:  YES

## 2014-06-15 ENCOUNTER — Ambulatory Visit (INDEPENDENT_AMBULATORY_CARE_PROVIDER_SITE_OTHER): Payer: Medicare Other

## 2014-06-15 DIAGNOSIS — Z23 Encounter for immunization: Secondary | ICD-10-CM

## 2014-06-15 NOTE — Telephone Encounter (Signed)
Noted  

## 2014-06-17 ENCOUNTER — Encounter: Payer: Self-pay | Admitting: Cardiology

## 2014-06-22 ENCOUNTER — Emergency Department (HOSPITAL_COMMUNITY)
Admission: EM | Admit: 2014-06-22 | Discharge: 2014-06-22 | Discharge status: Against Medical Advice | Payer: Medicare Other | Attending: Emergency Medicine | Admitting: Emergency Medicine

## 2014-06-22 ENCOUNTER — Telehealth: Payer: Self-pay | Admitting: Internal Medicine

## 2014-06-22 ENCOUNTER — Encounter (HOSPITAL_COMMUNITY): Payer: Self-pay | Admitting: Emergency Medicine

## 2014-06-22 DIAGNOSIS — G8929 Other chronic pain: Secondary | ICD-10-CM | POA: Diagnosis not present

## 2014-06-22 DIAGNOSIS — N644 Mastodynia: Secondary | ICD-10-CM | POA: Insufficient documentation

## 2014-06-22 DIAGNOSIS — I10 Essential (primary) hypertension: Secondary | ICD-10-CM | POA: Diagnosis not present

## 2014-06-22 NOTE — ED Notes (Signed)
Delay explained 

## 2014-06-22 NOTE — ED Notes (Signed)
Pt son states that he is going to take pt home. Does not want to stay even though room is ready. IV removed by Gloris Manchesterraci, RN.

## 2014-06-22 NOTE — Telephone Encounter (Signed)
Patient Information:  Caller Name: [name]  Phone: [phone]  Patient: [name], [name]  Gender: Female  DOB: Jul 27, 1923  Age: 80 Years  PCP: Eleonore ChiquitoKwiatkowski, Peter (Family Practice > 8324yrs old)  Office Follow Up:  Does the office need to follow up with this patient?: No  Instructions For The Office: N/A  RN Note:  Son/[name] states patient complained of mid-sternal chest discomfort radiating under left chest accompanied by shortness of breath. Onset 0930 06/22/14. States pain is intermittent. Patient complained of having an episode of feeling "lightheaded" at approx. 1030 06/22/14. Patient describes discomfort as "indigestion." Care advice given per guidelines. Son advised to call 911 Immediately. Advised to have patient chew one Aspirin 325mg . after calling 911. Advised not to leave patient alone. Son verbalizes understanding and agreeable.  Symptoms  Reason For Call & Symptoms: Chest pain, difficulty breathing  Reviewed Health History In EMR: Yes  Reviewed Medications In EMR: Yes  Reviewed Allergies In EMR: Yes  Reviewed Surgeries / Procedures: Yes  Date of Onset of Symptoms: 06/22/2014  Guideline(s) Used:  Chest Pain  Disposition Per Guideline:   Call EMS 911 Now  Reason For Disposition Reached:   Sounds like a life-threatening emergency to the triager  Advice Given:  N/A  Patient Will Follow Care Advice:  YES

## 2014-06-22 NOTE — ED Notes (Signed)
Per EMS pt having this chronic left breast pain that she has been having for years. sts that they did EKG and was normal. Pt currently denies any pain and doesn't understand why she is here. Per EMS her son called.

## 2014-06-27 ENCOUNTER — Ambulatory Visit: Payer: Medicare Other | Admitting: Internal Medicine

## 2014-06-28 ENCOUNTER — Other Ambulatory Visit: Payer: Self-pay | Admitting: Internal Medicine

## 2014-07-06 ENCOUNTER — Encounter: Payer: Self-pay | Admitting: Internal Medicine

## 2014-07-10 ENCOUNTER — Other Ambulatory Visit: Payer: Self-pay | Admitting: Internal Medicine

## 2014-07-14 ENCOUNTER — Telehealth: Payer: Self-pay | Admitting: Internal Medicine

## 2014-07-14 NOTE — Telephone Encounter (Signed)
Patient Information:  Caller Name: [name]  Phone: [phone]  Patient: [name], [name]  Gender: Female  DOB: Jan 10, 1923  Age: 80 Years  PCP: Eleonore ChiquitoKwiatkowski, [facility](Family Practice)  Office Follow Up:  Does the office need to follow up with this patient?: No  Instructions For The Office: N/A   Symptoms  Reason For Call & Symptoms: Pt's son/[name] calling to schedule appt for pt tomorrow. 07/15/14.  [name] states pt has choked on water this afternoon.  Pt has no symptoms at this time, but her son wants her seen and checked out to discuss other things going on with the pt such as agitation.  Reviewed Health History In EMR: Yes  Reviewed Medications In EMR: Yes  Reviewed Allergies In EMR: Yes  Reviewed Surgeries / Procedures: Yes  Date of Onset of Symptoms: 07/14/2014  Guideline(s) Used:  No Protocol Available - Information Only  No Protocol Available - Sick Adult  Disposition Per Guideline:   See Today or Tomorrow in Office  Reason For Disposition Reached:   Nursing judgment  Advice Given:  N/A  Patient Will Follow Care Advice:  YES  Appointment Scheduled:  07/15/2014 14:15:00 Appointment Scheduled Provider:  Tana ConchHunter, Stephen

## 2014-07-15 ENCOUNTER — Encounter: Payer: Self-pay | Admitting: Family Medicine

## 2014-07-15 ENCOUNTER — Ambulatory Visit (INDEPENDENT_AMBULATORY_CARE_PROVIDER_SITE_OTHER): Payer: Medicare Other | Admitting: Family Medicine

## 2014-07-15 VITALS — BP 150/58 | Temp 98.9°F | Wt 156.0 lb

## 2014-07-15 DIAGNOSIS — R1013 Epigastric pain: Secondary | ICD-10-CM

## 2014-07-15 NOTE — Progress Notes (Signed)
  Tana ConchStephen Seneca Hoback, MD Phone: [phone]  Subjective:   [name] is a 80 y.o. year old very pleasant female patient who presents with the following:  Epigastric abdominal pain-recurrent and nonworsening Epigastric pain for 4-6 months with intermittent pain in past that was very similar with extensive workup including EGD by Dr. Arlyce DiceKaplan. Belching a lot. Mild discomfort. Decreased appetite. Mild aching. Wakes up every morning complaining of it. A lot of stomach grumbling. When she pushes on the area that hurts, she burps. She burps and it feels better. Has not taken gas-x in some time.   ROS- normal stools, no shortness of breath, sweating, nauesa, vomiting  Past Medical History-anxiety, chest pain history atypical seen by Dr. Ladona Ridgelaylor and Dr. Arlyce DiceKaplan in the past, HTN, a fib, pacemaker, HLD, DM, GERD, baseline confusion-assume dementia  Medications- reviewed and updated Current Outpatient Prescriptions  Medication Sig Dispense Refill  . acetaminophen (TYLENOL) 500 MG tablet Take 1,000 mg by mouth every 6 (six) hours as needed. For pain    . famotidine (PEPCID) 20 MG tablet Take 1 tablet (20 mg total) by mouth 2 (two) times daily. 180 tablet 3  . furosemide (LASIX) 20 MG tablet Take 1 tablet (20 mg total) by mouth daily. 90 tablet 3  . LORazepam (ATIVAN) 0.5 MG tablet Take 1 tablet (0.5 mg total) by mouth 3 (three) times daily as needed for anxiety. 90 tablet 2  . metFORMIN (GLUCOPHAGE-XR) 500 MG 24 hr tablet TAKE 1 TABLET BY MOUTH EVERY DAY 90 tablet 2  . sertraline (ZOLOFT) 50 MG tablet TAKE 1 TABLET BY MOUTH EVERY DAY 90 tablet 1  . simvastatin (ZOCOR) 80 MG tablet Take 0.5 tablets (40 mg total) by mouth at bedtime. 90 tablet 1  . ALPRAZolam (XANAX) 0.5 MG tablet TAKE 1 TABLET BY MOUTH EVERY 6 HOURS AS NEEDED 90 tablet 2  . traMADol (ULTRAM) 50 MG tablet TAKE 1 TABLET BY MOUTH EVERY 8 HOURS AS NEEDED FOR PAIN 90 tablet 2   No current facility-administered medications for this visit.     Objective: BP 150/58 mmHg  Temp(Src) 98.9 F (37.2 C)  Wt 156 lb (70.761 kg) Gen: NAD, resting comfortably in chair CV: RRR no murmurs rubs or gallops Lungs: CTAB no crackles, wheeze, rhonchi Abdomen: soft/nondistended/active bowel sounds. No rebound or guarding. Mild pain epigastric area relieved when pushing on area and belching.  Ext: no edema Skin: warm, dry, no rash Neuro: confused as per baseline (per previous notes and son's report)   Assessment/Plan:  Epigastric pain Chronic issue it appears since at least 2012 with extensive workup in the past. Last seen by Dr. Kirtland BouchardK for similar issue in June 2015. I provided reassurance due to benign abdominal exam as well as relief with belching. I suggested patient try gas-x. She can follow up with Dr. Kirtland BouchardK in 1-2 weeks. I also comforted the son who is dealing with caregiver burden as well as dealing with his own health issues.    >50% of 15 minute office visit was spent on counseling (reassurance around chronic unchanging issue with extensive past workup) and coordination of care  Return precautions advised.

## 2014-07-15 NOTE — Assessment & Plan Note (Signed)
Chronic issue it appears since at least 2012 with extensive workup in the past. Last seen by Dr. Kirtland BouchardK for similar issue in June 2015. I provided reassurance due to benign abdominal exam as well as relief with belching. I suggested patient try gas-x. She can follow up with Dr. Kirtland BouchardK in 1-2 weeks. I also comforted the son who is dealing with caregiver burden as well as dealing with his own health issues.

## 2014-07-15 NOTE — Patient Instructions (Signed)
Let's check back in with Dr. Kirtland BouchardK in the next 1-2 weeks (go ahead and schedule today).   Trial gas-x as needed per directions on the box.

## 2014-07-18 ENCOUNTER — Other Ambulatory Visit: Payer: Self-pay | Admitting: Internal Medicine

## 2014-07-21 ENCOUNTER — Encounter: Payer: Self-pay | Admitting: Internal Medicine

## 2014-07-21 ENCOUNTER — Telehealth: Payer: Self-pay | Admitting: Internal Medicine

## 2014-07-21 NOTE — Telephone Encounter (Signed)
Son called to say that he need a  letter written that states his mom is competent to sign legal document for POA .

## 2014-07-21 NOTE — Telephone Encounter (Signed)
Pt's son Gerlene BurdockRichard notified letter ready to be picked up.

## 2014-07-25 ENCOUNTER — Encounter: Payer: Self-pay | Admitting: *Deleted

## 2014-07-25 ENCOUNTER — Ambulatory Visit: Payer: Medicare Other | Admitting: Internal Medicine

## 2014-07-25 ENCOUNTER — Telehealth: Payer: Self-pay | Admitting: Internal Medicine

## 2014-07-25 NOTE — Telephone Encounter (Signed)
Please see message and advise 

## 2014-07-25 NOTE — Telephone Encounter (Signed)
Son states pt has refused to come for appt today. Pt states she just does not feel well enough.  Son states she is like this most the time. Refuses to go anywhere. Pt complains of headache today.  Son also having a hard time getting pt to eat. 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 Son would like to know if there is anything else that can be done to figure out what is wrong. Pt saw dr hunter 11/13 sent by [facility].  No changes since then.  pla advise.

## 2014-07-25 NOTE — Telephone Encounter (Signed)
Please call and check on status and offer an appointment if desired

## 2014-07-25 NOTE — Telephone Encounter (Signed)
Spoke to pt's son, asked him how pt was doing? He said not good says she does not feel well, stomach hurts. Asked him if she is taking Pepcid twice a day for stomach. He said yes. Asked him if she restarted Gas X that Dr. Durene CalHunter suggested. He said yes, but not helping. Told him pt needs to be seen to be evaluated. Pt got on phone, told her need to see Dr.K. Pt said okay. Transferred to front to schedule.

## 2014-08-02 ENCOUNTER — Ambulatory Visit (INDEPENDENT_AMBULATORY_CARE_PROVIDER_SITE_OTHER): Payer: Medicare Other | Admitting: Internal Medicine

## 2014-08-02 ENCOUNTER — Encounter: Payer: Self-pay | Admitting: *Deleted

## 2014-08-02 ENCOUNTER — Encounter: Payer: Self-pay | Admitting: Internal Medicine

## 2014-08-02 VITALS — BP 160/60 | HR 67 | Temp 98.5°F | Resp 18 | Ht 63.0 in | Wt 155.0 lb

## 2014-08-02 DIAGNOSIS — R1013 Epigastric pain: Secondary | ICD-10-CM

## 2014-08-02 DIAGNOSIS — I10 Essential (primary) hypertension: Secondary | ICD-10-CM

## 2014-08-02 NOTE — Progress Notes (Signed)
Pre visit review using our clinic review tool, if applicable. No additional management support is needed unless otherwise documented below in the visit note. 

## 2014-08-02 NOTE — Patient Instructions (Signed)
Limit your sodium (Salt) intake     It is important that you exercise regularly, at least 20 minutes 3 to 4 times per week.  If you develop chest pain or shortness of breath seek  medical attention.  Avoids foods high in acid such as tomatoes citrus juices, and spicy foods.  Avoid eating within two hours of lying down or before exercising.  Do not overheat.  Try smaller more frequent meals.  If symptoms persist, elevate the head of her bed 12 inches while sleeping.  

## 2014-08-02 NOTE — Progress Notes (Signed)
Subjective:    Patient ID: [name], female    DOB: Jan 21, 1923, 80 y.o.   MRN: [id number]  HPI 80 year old patient who has type 2 diabetes, hypertension who is seen today in follow-up. She has had some recent epigastric pain that apparently has improved.  She is unclear of the reason for her visit today, and her only complaint is some back pain.  She is accompanied by her son. In general doing fairly well.  Past Medical History  Diagnosis Date  . ANEMIA, DEFICIENCY, HX OF 03/01/2010  . DEPRESSIVE DISORDER 09/19/2010  . DM w/o Complication Type II 05/18/2007  . GERD 05/18/2007  . HYPERLIPIDEMIA 03/17/2008  . HYPERTENSION 05/18/2007  . INSOMNIA 11/23/2008  . Loss of weight 05/30/2010  . Kidney stone   . Glaucoma   . Anxiety     History   Social History  . Marital Status: Widowed    Spouse Name: N/A    Number of Children: 3  . Years of Education: N/A   Occupational History  . retired    Social History Main Topics  . Smoking status: Never Smoker   . Smokeless tobacco: Never Used  . Alcohol Use: No  . Drug Use: No  . Sexual Activity: Not on file   Other Topics Concern  . Not on file   Social History Narrative    Past Surgical History  Procedure Laterality Date  . Abdominal hysterectomy    . Cataract extraction    . Hemorrhoid surgery    . Appendectomy    . Pacemaker insertion  09/2011  . Dilation and curettage of uterus    . Colonoscopy  06-28-10    per Dr. Arlyce DiceKaplan, diverticulosis only   . Esophagogastroduodenoscopy  05-13-11    per Dr. Arlyce DiceKaplan, normal     Family History  Problem Relation Age of Onset  . Brain cancer Father   . Colon cancer Sister   . Colitis Brother   . Heart attack Brother     Allergies  Allergen Reactions  . Amoxicillin Rash  . Penicillins Rash    Current Outpatient Prescriptions on File Prior to Visit  Medication Sig Dispense Refill  . acetaminophen (TYLENOL) 500 MG tablet Take 1,000 mg by mouth every 6 (six) hours as needed.  For pain    . famotidine (PEPCID) 20 MG tablet TAKE 1 TABLET BY MOUTH TWICE A DAY 180 tablet 1  . furosemide (LASIX) 20 MG tablet Take 1 tablet (20 mg total) by mouth daily. 90 tablet 3  . LORazepam (ATIVAN) 0.5 MG tablet Take 1 tablet (0.5 mg total) by mouth 3 (three) times daily as needed for anxiety. 90 tablet 2  . metFORMIN (GLUCOPHAGE-XR) 500 MG 24 hr tablet TAKE 1 TABLET BY MOUTH EVERY DAY 90 tablet 2  . sertraline (ZOLOFT) 50 MG tablet TAKE 1 TABLET BY MOUTH EVERY DAY 90 tablet 1  . simvastatin (ZOCOR) 80 MG tablet Take 0.5 tablets (40 mg total) by mouth at bedtime. 90 tablet 1  . traMADol (ULTRAM) 50 MG tablet TAKE 1 TABLET BY MOUTH EVERY 8 HOURS AS NEEDED FOR PAIN 90 tablet 2   No current facility-administered medications on file prior to visit.    BP 160/60 mmHg  Pulse 67  Temp(Src) 98.5 F (36.9 C) (Oral)  Resp 18  Ht 5\' 3"  (1.6 m)  Wt 155 lb (70.308 kg)  BMI 27.46 kg/m2  SpO2 98%      Review of Systems  Constitutional: Negative.   HENT:  Negative for congestion, dental problem, hearing loss, rhinorrhea, sinus pressure, sore throat and tinnitus.   Eyes: Negative for pain, discharge and visual disturbance.  Respiratory: Negative for cough and shortness of breath.   Cardiovascular: Negative for chest pain, palpitations and leg swelling.  Gastrointestinal: Positive for abdominal pain (Resolved). Negative for nausea, vomiting, diarrhea, constipation, blood in stool and abdominal distention.  Genitourinary: Negative for dysuria, urgency, frequency, hematuria, flank pain, vaginal bleeding, vaginal discharge, difficulty urinating, vaginal pain and pelvic pain.  Musculoskeletal: Negative for joint swelling, arthralgias and gait problem.  Skin: Negative for rash.  Neurological: Negative for dizziness, syncope, speech difficulty, weakness, numbness and headaches.  Hematological: Negative for adenopathy.  Psychiatric/Behavioral: Negative for behavioral problems, dysphoric mood  and agitation. The patient is not nervous/anxious.        Objective:   Physical Exam  Constitutional: She is oriented to person, place, and time. She appears well-developed and well-nourished.  HENT:  Head: Normocephalic.  Right Ear: External ear normal.  Left Ear: External ear normal.  Mouth/Throat: Oropharynx is clear and moist.  Eyes: Conjunctivae and EOM are normal. Pupils are equal, round, and reactive to light.  Neck: Normal range of motion. Neck supple. No thyromegaly present.  Cardiovascular: Normal rate, regular rhythm, normal heart sounds and intact distal pulses.   Pulmonary/Chest: Effort normal and breath sounds normal.  Abdominal: Soft. Bowel sounds are normal. She exhibits no distension and no mass. There is no tenderness. There is no rebound and no guarding.  No epigastric pain or tenderness  Musculoskeletal: Normal range of motion.  Lymphadenopathy:    She has no cervical adenopathy.  Neurological: She is alert and oriented to person, place, and time.  Skin: Skin is warm and dry. No rash noted.  Psychiatric: She has a normal mood and affect. Her behavior is normal.          Assessment & Plan:   Epigastric pain.  Presently resolved.  Will continue present therapy, which includes Pepcid  Hypertension, stableDiabetes mellitus.  Will continue metformin.  Recheck 3 months with lab

## 2014-08-09 ENCOUNTER — Encounter: Payer: Self-pay | Admitting: Internal Medicine

## 2014-08-09 ENCOUNTER — Ambulatory Visit (INDEPENDENT_AMBULATORY_CARE_PROVIDER_SITE_OTHER): Payer: Medicare Other | Admitting: Internal Medicine

## 2014-08-09 VITALS — BP 146/60 | HR 79 | Ht 63.0 in | Wt 155.2 lb

## 2014-08-09 DIAGNOSIS — I480 Paroxysmal atrial fibrillation: Secondary | ICD-10-CM

## 2014-08-09 DIAGNOSIS — R001 Bradycardia, unspecified: Secondary | ICD-10-CM

## 2014-08-09 DIAGNOSIS — Z950 Presence of cardiac pacemaker: Secondary | ICD-10-CM

## 2014-08-09 DIAGNOSIS — I10 Essential (primary) hypertension: Secondary | ICD-10-CM

## 2014-08-09 NOTE — Progress Notes (Signed)
HPI [name] returns today for followup. She is a very pleasant elderly 80 yo woman with hypertension, symptomatic bradycardia, status post permanent pacemaker insertion, and is very hard of hearing. In the interim, she has been stable. She denies shortness of breath. Minimal peripheral edema. No syncope. She specifically denies any pain related to exertion ,activity, or movement. She admits to dietary indiscretion with sodium. Allergies  Allergen Reactions  . Amoxicillin Rash  . Penicillins Rash     Current Outpatient Prescriptions  Medication Sig Dispense Refill  . acetaminophen (TYLENOL) 500 MG tablet Take 1,000 mg by mouth every 6 (six) hours as needed. For pain    . famotidine (PEPCID) 20 MG tablet TAKE 1 TABLET BY MOUTH TWICE A DAY 180 tablet 1  . furosemide (LASIX) 20 MG tablet Take 1 tablet (20 mg total) by mouth daily. 90 tablet 3  . LORazepam (ATIVAN) 0.5 MG tablet Take 1 tablet (0.5 mg total) by mouth 3 (three) times daily as needed for anxiety. 90 tablet 2  . metFORMIN (GLUCOPHAGE-XR) 500 MG 24 hr tablet TAKE 1 TABLET BY MOUTH EVERY DAY (Patient taking differently: TAKE 1 TABLET BY MOUTH EVERY NIGHT) 90 tablet 2  . sertraline (ZOLOFT) 50 MG tablet TAKE 1 TABLET BY MOUTH EVERY DAY 90 tablet 1  . simvastatin (ZOCOR) 80 MG tablet Take 0.5 tablets (40 mg total) by mouth at bedtime. 90 tablet 1  . traMADol (ULTRAM) 50 MG tablet TAKE 1 TABLET BY MOUTH EVERY 8 HOURS AS NEEDED FOR PAIN 90 tablet 2   No current facility-administered medications for this visit.     Past Medical History  Diagnosis Date  . ANEMIA, DEFICIENCY, HX OF 03/01/2010  . DEPRESSIVE DISORDER 09/19/2010  . DM w/o Complication Type II 05/18/2007  . GERD 05/18/2007  . HYPERLIPIDEMIA 03/17/2008  . HYPERTENSION 05/18/2007  . INSOMNIA 11/23/2008  . Loss of weight 05/30/2010  . Kidney stone   . Glaucoma   . Anxiety     ROS:   All systems reviewed and negative except as noted in the HPI.   Past Surgical History   Procedure Laterality Date  . Abdominal hysterectomy    . Cataract extraction    . Hemorrhoid surgery    . Appendectomy    . Pacemaker insertion  09/2011  . Dilation and curettage of uterus    . Colonoscopy  06-28-10    per Dr. Arlyce DiceKaplan, diverticulosis only   . Esophagogastroduodenoscopy  05-13-11    per Dr. Arlyce DiceKaplan, normal      Family History  Problem Relation Age of Onset  . Brain cancer Father   . Colon cancer Sister   . Colitis Brother   . Heart attack Brother      History   Social History  . Marital Status: Widowed    Spouse Name: N/A    Number of Children: 3  . Years of Education: N/A   Occupational History  . retired    Social History Main Topics  . Smoking status: Never Smoker   . Smokeless tobacco: Never Used  . Alcohol Use: No  . Drug Use: No  . Sexual Activity: Not on file   Other Topics Concern  . Not on file   Social History Narrative     BP 146/60 mmHg  Pulse 79  Ht 5\' 3"  (1.6 m)  Wt 155 lb 4 oz (70.421 kg)  BMI 27.51 kg/m2  Physical Exam:  Well appearing elderly woman, NAD HEENT: Unremarkable Neck:  7 cm JVD,  no thyromegally Lungs:  Clear with no wheezes, rales, or rhonchi. Well-healed pacemaker incision HEART:  Regular rate rhythm, no murmurs, no rubs, no clicks Abd:  soft, positive bowel sounds, no organomegally, no rebound, no guarding Ext:  2 plus pulses, no edema, no cyanosis, no clubbing Skin:  No rashes no nodules Neuro:  CN II through XII intact, motor grossly intact  DEVICE  Normal device function.  See PaceArt for details.   Assess/Plan:

## 2014-08-09 NOTE — Assessment & Plan Note (Signed)
Pacemaker interrogation demonstrates that she is maintaining sinus rhythm 99% of the time. She will continue her current medical therapy.

## 2014-08-09 NOTE — Assessment & Plan Note (Signed)
Her [employer] dual-chamber pacemaker is working normally. Plan to recheck in several months.

## 2014-08-09 NOTE — Patient Instructions (Signed)
Your physician wants you to follow-up in: 12 months with Dr. Taylor You will receive a reminder letter in the mail two months in advance. If you don't receive a letter, please call our office to schedule the follow-up appointment.  Remote monitoring is used to monitor your Pacemaker of ICD from home. This monitoring reduces the number of office visits required to check your device to one time per year. It allows us to keep an eye on the functioning of your device to ensure it is working properly. You are scheduled for a device check from home on 11/08/14. You may send your transmission at any time that day. If you have a wireless device, the transmission will be sent automatically. After your physician reviews your transmission, you will receive a postcard with your next transmission date.    

## 2014-08-09 NOTE — Assessment & Plan Note (Signed)
Her blood pressure is slightly elevated today. She admits to sodium indiscretion. We discussed the importance of reduced salt intake.

## 2014-08-10 LAB — MDC_IDC_ENUM_SESS_TYPE_INCLINIC
Brady Statistic RA Percent Paced: 39 %
Date Time Interrogation Session: 20151208050000
Implantable Pulse Generator Serial Number: 123684
Lead Channel Impedance Value: 610 Ohm
Lead Channel Impedance Value: 879 Ohm
Lead Channel Pacing Threshold Amplitude: 0.6 V
Lead Channel Sensing Intrinsic Amplitude: 5 mV
Lead Channel Setting Pacing Amplitude: 2.4 V
Lead Channel Setting Sensing Sensitivity: 2.5 mV
MDC IDC MSMT LEADCHNL RA PACING THRESHOLD PULSEWIDTH: 0.4 ms
MDC IDC MSMT LEADCHNL RV PACING THRESHOLD AMPLITUDE: 0.4 V
MDC IDC MSMT LEADCHNL RV PACING THRESHOLD PULSEWIDTH: 0.4 ms
MDC IDC MSMT LEADCHNL RV SENSING INTR AMPL: 12.1 mV
MDC IDC SET LEADCHNL RA PACING AMPLITUDE: 2 V
MDC IDC SET LEADCHNL RV PACING PULSEWIDTH: 0.4 ms
MDC IDC SET ZONE DETECTION INTERVAL: 375 ms
MDC IDC STAT BRADY RV PERCENT PACED: 1 % — AB

## 2014-08-11 ENCOUNTER — Encounter (HOSPITAL_COMMUNITY): Payer: Self-pay | Admitting: Internal Medicine

## 2014-08-30 ENCOUNTER — Other Ambulatory Visit: Payer: Self-pay | Admitting: Internal Medicine

## 2014-09-21 LAB — HM DIABETES EYE EXAM

## 2014-10-07 ENCOUNTER — Encounter: Payer: Self-pay | Admitting: Internal Medicine

## 2014-10-10 ENCOUNTER — Ambulatory Visit (INDEPENDENT_AMBULATORY_CARE_PROVIDER_SITE_OTHER): Payer: Medicare Other | Admitting: Internal Medicine

## 2014-10-10 ENCOUNTER — Encounter: Payer: Self-pay | Admitting: Internal Medicine

## 2014-10-10 ENCOUNTER — Other Ambulatory Visit: Payer: Self-pay | Admitting: Internal Medicine

## 2014-10-10 VITALS — BP 120/70 | HR 74 | Temp 98.0°F | Resp 20 | Ht 63.0 in | Wt 145.0 lb

## 2014-10-10 DIAGNOSIS — E119 Type 2 diabetes mellitus without complications: Secondary | ICD-10-CM

## 2014-10-10 DIAGNOSIS — I10 Essential (primary) hypertension: Secondary | ICD-10-CM

## 2014-10-10 DIAGNOSIS — R197 Diarrhea, unspecified: Secondary | ICD-10-CM

## 2014-10-10 NOTE — Progress Notes (Signed)
Subjective:    Patient ID: [name], female    DOB: 12/31/1922, 81 y.o.   MRN: [id number]  HPI  Wt Readings from Last 3 Encounters:  10/10/14 145 lb (65.772 kg)  08/09/14 155 lb 4 oz (70.421 kg)  08/02/14 155 lb (70.57308 kg)   81 year old patient who has a history of type 2 diabetes which has been very well controlled on low-dose metformin therapy.  Patient is seen today for an acute visit due to the onset of diarrhea.  Patient apparently had a large diarrheal stool associated with incontinence earlier today.  No nausea or vomiting.  She is accompanied by her son.  She did take Pepto-Bismol and apparently has done well throughout the rest of the day. The patient has significant dementia and she is unable to recall her GI symptoms throughout the day.  She does deny any abdominal pain  Past Medical History  Diagnosis Date  . ANEMIA, DEFICIENCY, HX OF 03/01/2010  . DEPRESSIVE DISORDER 09/19/2010  . DM w/o Complication Type II 05/18/2007  . GERD 05/18/2007  . HYPERLIPIDEMIA 03/17/2008  . HYPERTENSION 05/18/2007  . INSOMNIA 11/23/2008  . Loss of weight 05/30/2010  . Kidney stone   . Glaucoma   . Anxiety     History   Social History  . Marital Status: Widowed    Spouse Name: N/A    Number of Children: 3  . Years of Education: N/A   Occupational History  . retired    Social History Main Topics  . Smoking status: Never Smoker   . Smokeless tobacco: Never Used  . Alcohol Use: No  . Drug Use: No  . Sexual Activity: Not on file   Other Topics Concern  . Not on file   Social History Narrative    Past Surgical History  Procedure Laterality Date  . Abdominal hysterectomy    . Cataract extraction    . Hemorrhoid surgery    . Appendectomy    . Pacemaker insertion  09/2011  . Dilation and curettage of uterus    . Colonoscopy  06-28-10    per Dr. Arlyce DiceKaplan, diverticulosis only   . Esophagogastroduodenoscopy  05-13-11    per Dr. Arlyce DiceKaplan, normal   . Permanent pacemaker  insertion N/A 09/30/2011    Procedure: PERMANENT PACEMAKER INSERTION;  Surgeon: Marinus MawGregg W Taylor, MD;  [facility];  Service: Cardiovascular;  Laterality: N/A;    Family History  Problem Relation Age of Onset  . Brain cancer Father   . Colon cancer Sister   . Colitis Brother   . Heart attack Brother     Allergies  Allergen Reactions  . Amoxicillin Rash  . Penicillins Rash    Current Outpatient Prescriptions on File Prior to Visit  Medication Sig Dispense Refill  . acetaminophen (TYLENOL) 500 MG tablet Take 1,000 mg by mouth every 6 (six) hours as needed. For pain    . famotidine (PEPCID) 20 MG tablet TAKE 1 TABLET BY MOUTH TWICE A DAY 180 tablet 1  . furosemide (LASIX) 20 MG tablet Take 1 tablet (20 mg total) by mouth daily. 90 tablet 3  . LORazepam (ATIVAN) 0.5 MG tablet TAKE 1 TABLET BY MOUTH 3 TIMES A DAY AS NEEDED 90 tablet 2  . metFORMIN (GLUCOPHAGE-XR) 500 MG 24 hr tablet TAKE 1 TABLET BY MOUTH EVERY DAY (Patient taking differently: TAKE 1 TABLET BY MOUTH EVERY NIGHT) 90 tablet 2  . sertraline (ZOLOFT) 50 MG tablet TAKE 1 TABLET BY MOUTH EVERY  DAY 90 tablet 1  . simvastatin (ZOCOR) 80 MG tablet Take 0.5 tablets (40 mg total) by mouth at bedtime. 90 tablet 1  . traMADol (ULTRAM) 50 MG tablet TAKE 1 TABLET BY MOUTH EVERY 8 HOURS AS NEEDED FOR PAIN 90 tablet 2   No current facility-administered medications on file prior to visit.    BP 120/70 mmHg  Pulse 74  Temp(Src) 98 F (36.7 C) (Oral)  Resp 20  Ht  (1.6 m)  Wt 145 lb (65.772 kg)  BMI 25.69 kg/m2  SpO2 95%    Review of Systems  Constitutional: Negative.   HENT: Negative for congestion, dental problem, hearing loss, rhinorrhea, sinus pressure, sore throat and tinnitus.   Eyes: Negative for pain, discharge and visual disturbance.  Respiratory: Negative for cough and shortness of breath.   Cardiovascular: Negative for chest pain, palpitations and leg swelling.  Gastrointestinal: Positive for diarrhea.  Negative for nausea, vomiting, abdominal pain, constipation, blood in stool and abdominal distention.  Genitourinary: Negative for dysuria, urgency, frequency, hematuria, flank pain, vaginal bleeding, vaginal discharge, difficulty urinating, vaginal pain and pelvic pain.  Musculoskeletal: Negative for joint swelling, arthralgias and gait problem.  Skin: Negative for rash.  Neurological: Negative for dizziness, syncope, speech difficulty, weakness, numbness and headaches.  Hematological: Negative for adenopathy.  Psychiatric/Behavioral: Negative for behavioral problems, dysphoric mood and agitation. The patient is nervous/anxious.        Objective:   Physical Exam  Constitutional: She is oriented to person, place, and time. She appears well-developed and well-nourished.  HENT:  Head: Normocephalic.  Right Ear: External ear normal.  Left Ear: External ear normal.  Mouth/Throat: Oropharynx is clear and moist.  Appears well-hydrated  Eyes: Conjunctivae and EOM are normal. Pupils are equal, round, and reactive to light.  Neck: Normal range of motion. Neck supple. No thyromegaly present.  Cardiovascular: Normal rate, regular rhythm, normal heart sounds and intact distal pulses.   Pulmonary/Chest: Effort normal and breath sounds normal.  Abdominal: Soft. Bowel sounds are normal. She exhibits no distension and no mass. There is no tenderness. There is no rebound and no guarding.  Musculoskeletal: Normal range of motion.  Lymphadenopathy:    She has no cervical adenopathy.  Neurological: She is alert and oriented to person, place, and time.  Skin: Skin is warm and dry. No rash noted.  Psychiatric: She has a normal mood and affect. Her behavior is normal.          Assessment & Plan:   Diarrhea.  Appears to be self-limited.  Encouraged liberal fluid intake and a low residue diet.  Will advance diet as tolerated.  Patient has been asked to hold metformin therapy for 3 days Hypertension,  stable

## 2014-10-10 NOTE — Progress Notes (Signed)
Pre visit review using our clinic review tool, if applicable. No additional management support is needed unless otherwise documented below in the visit note. 

## 2014-10-10 NOTE — Patient Instructions (Addendum)
Call or return to clinic prn if these symptoms worsen or fail to improve as anticipated.  Hold metformin for 3 days

## 2014-10-11 ENCOUNTER — Telehealth: Payer: Self-pay | Admitting: Internal Medicine

## 2014-10-11 NOTE — Telephone Encounter (Signed)
Spoke to pt's son Gerlene BurdockRichard, he said can not find medications, they are missing but he is pretty sure pt did not take them. Told him there are refills on medications just go to the pharmacy and see if they can refill for you and if you need something they will send me a message. [name] verbalized understanding.

## 2014-10-11 NOTE — Telephone Encounter (Signed)
Pt son called to say that he gave his Mom her medicine yesterday and then last night when he went to give her the medicine he could not find it .Marland Kitchen. He said he don't what she did with the med he said he has search the house up and down and still can not fine the medicine. Below is the list of meds he can not fine.   Son would like a call back       furosemide (LASIX) 20 MG tablet,sertraline (ZOLOFT) 50 MG tablet,LORazepam (ATIVAN) 0.5 MG tablet,famotidine (PEPCID) 20 MG tablet

## 2014-10-18 ENCOUNTER — Emergency Department (HOSPITAL_COMMUNITY): Payer: Medicare Other

## 2014-10-18 ENCOUNTER — Telehealth: Payer: Self-pay | Admitting: Internal Medicine

## 2014-10-18 ENCOUNTER — Encounter (HOSPITAL_COMMUNITY): Payer: Self-pay

## 2014-10-18 ENCOUNTER — Emergency Department (HOSPITAL_COMMUNITY)
Admission: EM | Admit: 2014-10-18 | Discharge: 2014-10-19 | Disposition: A | Discharge status: Home / Self Care | Payer: Medicare Other | Attending: Emergency Medicine | Admitting: Emergency Medicine

## 2014-10-18 DIAGNOSIS — F419 Anxiety disorder, unspecified: Secondary | ICD-10-CM | POA: Insufficient documentation

## 2014-10-18 DIAGNOSIS — I10 Essential (primary) hypertension: Secondary | ICD-10-CM | POA: Insufficient documentation

## 2014-10-18 DIAGNOSIS — F0390 Unspecified dementia without behavioral disturbance: Secondary | ICD-10-CM

## 2014-10-18 DIAGNOSIS — R51 Headache: Secondary | ICD-10-CM | POA: Diagnosis not present

## 2014-10-18 DIAGNOSIS — Z8719 Personal history of other diseases of the digestive system: Secondary | ICD-10-CM | POA: Insufficient documentation

## 2014-10-18 DIAGNOSIS — Z87442 Personal history of urinary calculi: Secondary | ICD-10-CM | POA: Diagnosis not present

## 2014-10-18 DIAGNOSIS — Z8669 Personal history of other diseases of the nervous system and sense organs: Secondary | ICD-10-CM | POA: Insufficient documentation

## 2014-10-18 DIAGNOSIS — Z880 Allergy status to penicillin: Secondary | ICD-10-CM | POA: Insufficient documentation

## 2014-10-18 DIAGNOSIS — E785 Hyperlipidemia, unspecified: Secondary | ICD-10-CM | POA: Insufficient documentation

## 2014-10-18 DIAGNOSIS — Z008 Encounter for other general examination: Secondary | ICD-10-CM | POA: Diagnosis present

## 2014-10-18 DIAGNOSIS — F329 Major depressive disorder, single episode, unspecified: Secondary | ICD-10-CM | POA: Diagnosis not present

## 2014-10-18 DIAGNOSIS — R109 Unspecified abdominal pain: Secondary | ICD-10-CM | POA: Insufficient documentation

## 2014-10-18 DIAGNOSIS — R4182 Altered mental status, unspecified: Secondary | ICD-10-CM

## 2014-10-18 LAB — CBC WITH DIFFERENTIAL/PLATELET
Basophils Absolute: 0 10*3/uL (ref 0.0–0.1)
Basophils Relative: 0 % (ref 0–1)
EOS ABS: 0 10*3/uL (ref 0.0–0.7)
Eosinophils Relative: 0 % (ref 0–5)
HEMATOCRIT: 32.6 % — AB (ref 36.0–46.0)
Hemoglobin: 9.7 g/dL — ABNORMAL LOW (ref 12.0–15.0)
LYMPHS PCT: 13 % (ref 12–46)
Lymphs Abs: 1.6 10*3/uL (ref 0.7–4.0)
MCH: 23.9 pg — ABNORMAL LOW (ref 26.0–34.0)
MCHC: 29.8 g/dL — AB (ref 30.0–36.0)
MCV: 80.3 fL (ref 78.0–100.0)
MONO ABS: 0.8 10*3/uL (ref 0.1–1.0)
Monocytes Relative: 6 % (ref 3–12)
Neutro Abs: 10.5 10*3/uL — ABNORMAL HIGH (ref 1.7–7.7)
Neutrophils Relative %: 81 % — ABNORMAL HIGH (ref 43–77)
PLATELETS: 346 10*3/uL (ref 150–400)
RBC: 4.06 MIL/uL (ref 3.87–5.11)
RDW: 15.9 % — AB (ref 11.5–15.5)
WBC: 12.9 10*3/uL — ABNORMAL HIGH (ref 4.0–10.5)

## 2014-10-18 LAB — URINALYSIS, ROUTINE W REFLEX MICROSCOPIC
BILIRUBIN URINE: NEGATIVE
Glucose, UA: NEGATIVE mg/dL
HGB URINE DIPSTICK: NEGATIVE
KETONES UR: NEGATIVE mg/dL
Leukocytes, UA: NEGATIVE
NITRITE: NEGATIVE
PH: 5.5 (ref 5.0–8.0)
Protein, ur: NEGATIVE mg/dL
SPECIFIC GRAVITY, URINE: 1.025 (ref 1.005–1.030)
Urobilinogen, UA: 0.2 mg/dL (ref 0.0–1.0)

## 2014-10-18 LAB — BASIC METABOLIC PANEL
ANION GAP: 9 (ref 5–15)
BUN: 21 mg/dL (ref 6–23)
CHLORIDE: 104 mmol/L (ref 96–112)
CO2: 26 mmol/L (ref 19–32)
Calcium: 9.3 mg/dL (ref 8.4–10.5)
Creatinine, Ser: 1.15 mg/dL — ABNORMAL HIGH (ref 0.50–1.10)
GFR calc Af Amer: 47 mL/min — ABNORMAL LOW (ref >90)
GFR calc non Af Amer: 41 mL/min — ABNORMAL LOW (ref >90)
GLUCOSE: 130 mg/dL — AB (ref 70–99)
Potassium: 3.3 mmol/L — ABNORMAL LOW (ref 3.5–5.1)
SODIUM: 139 mmol/L (ref 135–145)

## 2014-10-18 NOTE — Telephone Encounter (Signed)
Spoke to pt's son Gerlene BurdockRichard, said pt has no memory anymore and he is having a hard time handling things and needs help.Told [name] I will call him back after discussing with Dr.K.   Called [name] back told him discussed with Dr. Kirtland BouchardK and he recommended take pt to the ED to be evaluated to get help and possibly placed. [name] verbalized understanding

## 2014-10-18 NOTE — Progress Notes (Addendum)
CSW met with pt at bedside. Son was present. Pt states she feels confused and nervous because she is in the hospital. Patient appears to be hard of hearing.  Son states he stays with pt at her home in [place]. According to son, he is the pt's only support. Also, son informed CSW that the pt has two other children but they are not involved with pt's care. He states that this is a stressor to the pt.   Son states that he brought the patient into [facility] as advised by the pt's doctor. He says that the doctor advised him to bring the pt in due to her showing symptoms of dementia. He states that the doctor said bringing her into the ED would be the best thing. Son states that the pt is able to feed herself independently but cannot shower and dress herself without assistance. Son informed CSW that he feels as though he needs assistance with pt. During the interview son appears to be very overwhelmed.   Son stated to CSW that he just wanted to do what is in the best interest for the pt. CSW consulted with physician, at this time it is not for certain if the pt will be admitted.  Son/[name]  Bowen [phone]   Willette Brace [phone] ED CSW 10/18/2014 9:11 PM

## 2014-10-18 NOTE — Telephone Encounter (Signed)
Pt son would like a call back he said he has some questions about things that are going on with her. .Marland Kitchen

## 2014-10-18 NOTE — Discharge Instructions (Signed)
Altered Mental Status Altered mental status most often refers to an abnormal change in your responsiveness and awareness. It can affect your speech, thought, mobility, memory, attention span, or alertness. It can range from slight confusion to complete unresponsiveness (coma). Altered mental status can be a sign of a serious underlying medical condition. Rapid evaluation and medical treatment is necessary for patients having an altered mental status. CAUSES   Low blood sugar (hypoglycemia) or diabetes.  Severe loss of body fluids (dehydration) or a body salt (electrolyte) imbalance.  A stroke or other neurologic problem, such as dementia or delirium.  A head injury or tumor.  A drug or alcohol overdose.  Exposure to toxins or poisons.  Depression, anxiety, and stress.  A low oxygen level (hypoxia).  An infection.  Blood loss.  Twitching or shaking (seizure).  Heart problems, such as heart attack or heart rhythm problems (arrhythmias).  A body temperature that is too low or too high (hypothermia or hyperthermia). DIAGNOSIS  A diagnosis is based on your history, symptoms, physical and neurologic examinations, and diagnostic tests. Diagnostic tests may include:  Measurement of your blood pressure, pulse, breathing, and oxygen levels (vital signs).  Blood tests.  Urine tests.  X-ray exams.  A computerized magnetic scan (magnetic resonance imaging, MRI).  A computerized X-ray scan (computed tomography, CT scan). TREATMENT  Treatment will depend on the cause. Treatment may include:  Management of an underlying medical or mental health condition.  Critical care or support in the hospital. HOME CARE INSTRUCTIONS   Only take over-the-counter or prescription medicines for pain, discomfort, or fever as directed by your caregiver.  Manage underlying conditions as directed by your caregiver.  Eat a healthy, well-balanced diet to maintain strength.  Join a support group or  prevention program to cope with the condition or trauma that caused the altered mental status. Ask your caregiver to help choose a program that works for you.  Follow up with your caregiver for further examination, therapy, or testing as directed. SEEK MEDICAL CARE IF:   You feel unwell or have chills.  You or your family notice a change in your behavior or your alertness.  You have trouble following your caregiver's treatment plan.  You have questions or concerns. SEEK IMMEDIATE MEDICAL CARE IF:   You have a rapid heartbeat or have chest pain.  You have difficulty breathing.  You have a fever.  You have a headache with a stiff neck.  You cough up blood.  You have blood in your urine or stool.  You have severe agitation or confusion. MAKE SURE YOU:   Understand these instructions.  Will watch your condition.  Will get help right away if you are not doing well or get worse. Document Released: 02/06/2010 Document Revised: 11/11/2011 Document Reviewed: 02/06/2010 [facility] Patient Information 2015 [place], [place]. This information is not intended to replace advice given to you by your health care provider. Make sure you discuss any questions you have with your health care provider.  Dementia Dementia is a general term for problems with brain function. A person with dementia has memory loss and a hard time with at least one other brain function such as thinking, speaking, or problem solving. Dementia can affect social functioning, how you do your job, your mood, or your personality. The changes may be hidden for a long time. The earliest forms of this disease are usually not detected by family or friends. Dementia can be:  Irreversible.  Potentially reversible.  Partially reversible.  Progressive. This means it can get worse over time. CAUSES  Irreversible dementia causes may include:  Degeneration of brain cells (Alzheimer disease or Lewy body dementia).  Multiple  small strokes (vascular dementia).  Infection (chronic meningitis or Creutzfeldt-Jakob disease).  Frontotemporal dementia. This affects younger people, age 79 to 5370, compared to those who have Alzheimer disease.  Dementia associated with other disorders like Parkinson disease, Huntington disease, or HIV-associated dementia. Potentially or partially reversible dementia causes may include:  Medicines.  Metabolic causes such as excessive alcohol intake, vitamin B12 deficiency, or thyroid disease.  Masses or pressure in the brain such as a tumor, blood clot, or hydrocephalus. SIGNS AND SYMPTOMS  Symptoms are often hard to detect. Family members or coworkers may not notice them early in the disease process. Different people with dementia may have different symptoms. Symptoms can include:  A hard time with memory, especially recent memory. Long-term memory may not be impaired.  Asking the same question multiple times or forgetting something someone just said.  A hard time speaking your thoughts or finding certain words.  A hard time solving problems or performing familiar tasks (such as how to use a telephone).  Sudden changes in mood.  Changes in personality, especially increasing moodiness or mistrust.  Depression.  A hard time understanding complex ideas that were never a problem in the past. DIAGNOSIS  There are no specific tests for dementia.   Your health care provider may recommend a thorough evaluation. This is because some forms of dementia can be reversible. The evaluation will likely include a physical exam and getting a detailed history from you and a family member. The history often gives the best clues and suggestions for a diagnosis.  Memory testing may be done. A detailed brain function evaluation called neuropsychologic testing may be helpful.  Lab tests and brain imaging (such as a CT scan or MRI scan) are sometimes important.  Sometimes observation and  re-evaluation over time is very helpful. TREATMENT  Treatment depends on the cause.   If the problem is a vitamin deficiency, it may be helped or cured with supplements.  For dementias such as Alzheimer disease, medicines are available to stabilize or slow the course of the disease. There are no cures for this type of dementia.  Your health care provider can help direct you to groups, organizations, and other health care providers to help with decisions in the care of you or your loved one. HOME CARE INSTRUCTIONS The care of individuals with dementia is varied and dependent upon the progression of the dementia. The following suggestions are intended for the person living with, or caring for, the person with dementia.  Create a safe environment.  Remove the locks on bathroom doors to prevent the person from accidentally locking himself or herself in.  Use childproof latches on kitchen cabinets and any place where cleaning supplies, chemicals, or alcohol are kept.  Use childproof covers in unused electrical outlets.  Install childproof devices to keep doors and windows secured.  Remove stove knobs or install safety knobs and an automatic shut-off on the stove.  Lower the temperature on water heaters.  Label medicines and keep them locked up.  Secure knives, lighters, matches, power tools, and guns, and keep these items out of reach.  Keep the house free from clutter. Remove rugs or anything that might contribute to a fall.  Remove objects that might break and hurt the person.  Make sure lighting is good, both inside and outside.  Install grab rails as needed.  Use a monitoring device to alert you to falls or other needs for help.  Reduce confusion.  Keep familiar objects and people around.  Use night lights or dim lights at night.  Label items or areas.  Use reminders, notes, or directions for daily activities or tasks.  Keep a simple, consistent routine for waking,  meals, bathing, dressing, and bedtime.  Create a calm, quiet environment.  Place large clocks and calendars prominently.  Display emergency numbers and home address near all telephones.  Use cues to establish different times of the day. An example is to open curtains to let the natural light in during the day.   Use effective communication.  Choose simple words and short sentences.  Use a gentle, calm tone of voice.  Be careful not to interrupt.  If the person is struggling to find a word or communicate a thought, try to provide the word or thought.  Ask one question at a time. Allow the person ample time to answer questions. Repeat the question again if the person does not respond.  Reduce nighttime restlessness.  Provide a comfortable bed.  Have a consistent nighttime routine.  Ensure a regular walking or physical activity schedule. Involve the person in daily activities as much as possible.  Limit napping during the day.  Limit caffeine.  Attend social events that stimulate rather than overwhelm the senses.  Encourage good nutrition and hydration.  Reduce distractions during meal times and snacks.  Avoid foods that are too hot or too cold.  Monitor chewing and swallowing ability.  Continue with routine vision, hearing, dental, and medical screenings.  Give medicines only as directed by the health care provider.  Monitor driving abilities. Do not allow the person to drive when safe driving is no longer possible.  Register with an identification program which could provide location assistance in the event of a missing person situation. SEEK MEDICAL CARE IF:   New behavioral problems start such as moodiness, aggressiveness, or seeing things that are not there (hallucinations).  Any new problem with brain function happens. This includes problems with balance, speech, or falling a lot.  Problems with swallowing develop.  Any symptoms of other illness  happen. Small changes or worsening in any aspect of brain function can be a sign that the illness is getting worse. It can also be a sign of another medical illness such as infection. Seeing a health care provider right away is important. SEEK IMMEDIATE MEDICAL CARE IF:   A fever develops.  New or worsened confusion develops.  New or worsened sleepiness develops.  Staying awake becomes hard to do. Document Released: 02/12/2001 Document Revised: 01/03/2014 Document Reviewed: 01/14/2011 [facility] Patient Information 2015 [place], [place]. This information is not intended to replace advice given to you by your health care provider. Make sure you discuss any questions you have with your health care provider.

## 2014-10-18 NOTE — ED Notes (Signed)
Per son, he is taking care of mom with dementia.  Mom's acuity is getting too bad for him.  ? Placement.  Denies any physical changes in Mom today that prompted visit.

## 2014-10-18 NOTE — ED Notes (Signed)
Float nurse.  

## 2014-10-18 NOTE — Telephone Encounter (Signed)
Pt son would like donna to return his call around 2pm today

## 2014-10-18 NOTE — ED Provider Notes (Signed)
CSN: [id number]     Arrival date & time 10/18/14  1811 History   First MD Initiated Contact with Patient 10/18/14 1920     Chief Complaint  Patient presents with  . social work      (Consider location/radiation/quality/duration/timing/severity/associated sxs/prior Treatment) HPI Comments: Patient brought to the ER by her son for progressive decline in mental status. Son reports that she has a history of dementia and he has been her primary caregiver for years. Lately she has significantly declined and become too much for him to manage on his own. She has had complaints of abdominal discomfort and headaches, but these have been chronic and ongoing. Her primary care doctor has treated for reflux and discontinued the metformin in response to these complaints. She is taking Ultram for the discomfort. Son has not noticed significant improvement.   Past Medical History  Diagnosis Date  . ANEMIA, DEFICIENCY, HX OF 03/01/2010  . DEPRESSIVE DISORDER 09/19/2010  . DM w/o Complication Type II 05/18/2007  . GERD 05/18/2007  . HYPERLIPIDEMIA 03/17/2008  . HYPERTENSION 05/18/2007  . INSOMNIA 11/23/2008  . Loss of weight 05/30/2010  . Kidney stone   . Glaucoma   . Anxiety    Past Surgical History  Procedure Laterality Date  . Abdominal hysterectomy    . Cataract extraction    . Hemorrhoid surgery    . Appendectomy    . Pacemaker insertion  09/2011  . Dilation and curettage of uterus    . Colonoscopy  06-28-10    per Dr. Arlyce Dice, diverticulosis only   . Esophagogastroduodenoscopy  05-13-11    per Dr. Arlyce Dice, normal   . Permanent pacemaker insertion N/A 09/30/2011    Procedure: PERMANENT PACEMAKER INSERTION;  Surgeon: Marinus Maw, MD;  Location: [facility] CATH LAB;  Service: Cardiovascular;  Laterality: N/A;   Family History  Problem Relation Age of Onset  . Brain cancer Father   . Colon cancer Sister   . Colitis Brother   . Heart attack Brother    History  Substance Use Topics  . Smoking status:  Never Smoker   . Smokeless tobacco: Never Used  . Alcohol Use: No   OB History    Gravida Para Term Preterm AB TAB SAB Ectopic Multiple Living            3     Review of Systems  Gastrointestinal: Positive for abdominal pain.  Neurological: Positive for headaches.  Psychiatric/Behavioral: Positive for confusion and agitation.  All other systems reviewed and are negative.     Allergies  Amoxicillin and Penicillins  Home Medications   Prior to Admission medications   Medication Sig Start Date End Date Taking? Authorizing Provider  acetaminophen (TYLENOL) 500 MG tablet Take 1,000 mg by mouth every 6 (six) hours as needed. For pain   Yes Historical Provider, MD  famotidine (PEPCID) 20 MG tablet Take 20 mg by mouth 2 (two) times daily.   Yes Historical Provider, MD  furosemide (LASIX) 20 MG tablet Take 1 tablet (20 mg total) by mouth daily. 01/12/14  Yes Gordy Savers, MD  LORazepam (ATIVAN) 0.5 MG tablet Take 0.5 mg by mouth 3 (three) times daily as needed for anxiety.   Yes Historical Provider, MD  metFORMIN (GLUCOPHAGE-XR) 500 MG 24 hr tablet Take 500 mg by mouth daily with breakfast.   Yes Historical Provider, MD  sertraline (ZOLOFT) 50 MG tablet Take 50 mg by mouth daily.   Yes Historical Provider, MD  simvastatin (ZOCOR) 80 MG tablet  Take 0.5 tablets (40 mg total) by mouth at bedtime. 06/04/13  Yes Gordy Savers, MD  traMADol (ULTRAM) 50 MG tablet Take 50 mg by mouth every 8 (eight) hours as needed for moderate pain.   Yes Historical Provider, MD  famotidine (PEPCID) 20 MG tablet TAKE 1 TABLET BY MOUTH TWICE A DAY Patient not taking: Reported on 10/18/2014 07/18/14   Gordy Savers, MD  LORazepam (ATIVAN) 0.5 MG tablet TAKE 1 TABLET BY MOUTH 3 TIMES A DAY AS NEEDED Patient not taking: Reported on 10/18/2014 08/31/14   Gordy Savers, MD  metFORMIN (GLUCOPHAGE-XR) 500 MG 24 hr tablet TAKE 1 TABLET BY MOUTH EVERY DAY Patient not taking: Reported on 10/18/2014  07/11/14   Gordy Savers, MD  sertraline (ZOLOFT) 50 MG tablet TAKE 1 TABLET BY MOUTH EVERY DAY Patient not taking: Reported on 10/18/2014 05/12/14   Gordy Savers, MD  traMADol (ULTRAM) 50 MG tablet TAKE 1 TABLET BY MOUTH EVERY 8 HOURS AS NEEDED FOR PAIN Patient not taking: Reported on 10/18/2014 10/10/14   Gordy Savers, MD   BP 158/101 mmHg  Pulse 58  Temp(Src) 98.8 F (37.1 C) (Oral)  Resp 22  SpO2 100% Physical Exam  Constitutional: She appears well-developed and well-nourished. No distress.  HENT:  Head: Normocephalic and atraumatic.  Right Ear: Hearing normal.  Left Ear: Hearing normal.  Nose: Nose normal.  Mouth/Throat: Oropharynx is clear and moist and mucous membranes are normal.  Eyes: Conjunctivae and EOM are normal. Pupils are equal, round, and reactive to light.  Neck: Normal range of motion. Neck supple.  Cardiovascular: Regular rhythm, S1 normal and S2 normal.  Exam reveals no gallop and no friction rub.   No murmur heard. Pulmonary/Chest: Effort normal and breath sounds normal. No respiratory distress. She exhibits no tenderness.  Abdominal: Soft. Normal appearance and bowel sounds are normal. There is no hepatosplenomegaly. There is no tenderness. There is no rebound, no guarding, no tenderness at McBurney's point and negative Murphy's sign. No hernia.  Musculoskeletal: Normal range of motion.  Neurological: She is alert. She has normal strength. She is disoriented. No cranial nerve deficit or sensory deficit. Coordination normal. GCS eye subscore is 4. GCS verbal subscore is 4. GCS motor subscore is 6.  Skin: Skin is warm, dry and intact. No rash noted. No cyanosis.  Psychiatric: Her speech is normal and behavior is normal. Thought content normal. Her mood appears anxious.  Nursing note and vitals reviewed.   ED Course  Procedures (including critical care time) Labs Review Labs Reviewed  CBC WITH DIFFERENTIAL/PLATELET - Abnormal; Notable for the  following:    WBC 12.9 (*)    Hemoglobin 9.7 (*)    HCT 32.6 (*)    MCH 23.9 (*)    MCHC 29.8 (*)    RDW 15.9 (*)    Neutrophils Relative % 81 (*)    Neutro Abs 10.5 (*)    All other components within normal limits  BASIC METABOLIC PANEL - Abnormal; Notable for the following:    Potassium 3.3 (*)    Glucose, Bld 130 (*)    Creatinine, Ser 1.15 (*)    GFR calc non Af Amer 41 (*)    GFR calc Af Amer 47 (*)    All other components within normal limits  URINALYSIS, ROUTINE W REFLEX MICROSCOPIC    Imaging Review Ct Head Wo Contrast  10/18/2014   CLINICAL DATA:  History dementia, and decreased in mental acuity and memory for several  months, history diabetes mellitus type II, hypertension, hyperlipidemia  EXAM: CT HEAD WITHOUT CONTRAST  TECHNIQUE: Contiguous axial images were obtained from the base of the skull through the vertex without intravenous contrast.  COMPARISON:  04/05/2010  FINDINGS: Generalized atrophy.  Normal ventricular morphology.  No midline shift or mass effect.  Probable small vessel chronic ischemic changes of deep cerebral white matter.  No intracranial hemorrhage, mass lesion, or acute infarction.  Visualized paranasal sinuses and mastoid air cells clear.  Bones unremarkable.  IMPRESSION: Atrophy with probable small vessel chronic ischemic changes of deep cerebral white matter.  No acute intracranial abnormalities.   Electronically Signed   By: Ulyses SouthwardMark  Boles M.D.   On: 10/18/2014 20:48     EKG Interpretation None      MDM   Final diagnoses:  Mental status change   dementia  Patient with progressive dementia brought to the ER by son because he is having difficulty managing her at home. Patient was without any complaints currently. She has had some chronic complaints of abdominal discomfort, managed by her primary care doctor. Abdominal exam is benign. Lab work is benign. Urinalysis is normal. She has complained of headache as well, CT head was performed, was  unremarkable. She does not have any criteria for admission at this time.  Patient has been evaluated by case management and social work. Son is currently having difficulty with the decision to place her in skilled nursing at this time. Patient will be discharged into his care and arrangements made for home nursing, physical therapy, social work to help him in an ongoing manner.    Gilda Creasehristopher J. Pollina, MD 10/18/14 2154

## 2014-10-19 NOTE — Progress Notes (Signed)
  CARE MANAGEMENT ED NOTE 10/19/2014  Patient:  [name],[name]   Account Number:  [id number]  Date Initiated:  10/18/2014  Documentation initiated by:  Radford PaxFERRERO,Breyson Kelm  Subjective/Objective Assessment:   Patient presents to ED because son is having difficulty managing patient on his own     Subjective/Objective Assessment Detail:     Action/Plan:   Action/Plan Detail:   Anticipated DC Date:  10/18/2014     Status Recommendation to Physician:   Result of Recommendation:    Other ED Services  Consult Working Plan   In-house referral  Clinical Social Worker   DC Associate Professorlanning Services  CM consult  Other   [facility]  [facility]   Choice offered to / List presented to:  C-4 Adult Children     HH arranged  HH-1 RN  HH-2 PT  HH-3 OT  HH-4 NURSE'S AIDE  HH-6 SOCIAL WORKER      HH agency  [facility]    Status of service:  Completed, signed off  ED Comments:   ED Comments Detail:  [facility].  EDCM spoke to patient's son Gerlene BurdockRichard at bedside [phone].  [name] reports he lives with the patient. [name] reports patient has no one else but him to take care of the patient.  Patient has a walker, and a "chair in the tub" at home.  [name] reports, "She is reluctant to use the walker."  Patient reports, "It's because I don't need to use it." Patient does not have home health services at home at this time. [name] reports he believes patient has had [facility] in the past.  [name] with list of home health agencies in [place], explained home health services.  Informed [name] agency of his choice has 24 to 48 hours to contact him for services.  EDCM highlighted [facility] numberof [facility] on agency list for [name].  EDCM explained to [name] that if he should decide patient  needed alf or snf placement, patient's pcp can assist and [facility] worker [facility] them from home.  EDCM also  provided [name] with list of private duty nursing agencies and printed information regarding senoir resources [place].  EDCM explained that private duty nursing would be an out of pocket expense.  EDCM asked [name] if patient requires any further eqiupment at home?  Per [name], "I can't think of anything else she can need at this time."  [facility] explained to patient that if he needs any further equipment he may ask [facility] to assist.  [facility] who placed orders for home health RN, PT, OT, aide and social worker.  [facility] with confirmation of receipt at 0003am.  No further EDCM needs at this time.

## 2014-10-19 NOTE — Progress Notes (Signed)
[facility]  At 2141pm with confirmation of receipt at 2144pm.

## 2014-10-19 NOTE — Progress Notes (Signed)
EDCM received phone from [place] [facility] reporting [facility] did not want [facility] for her home health agency.  EDCM called patient's son Gerlene BurdockRichard and asked Gerlene BurdockRichard if he wanted [facility] since [facility] his second choice of home health agency for the patient.  [name] reports he has already spoken to [facility] and is agreeable to have [facility].  Patient's son thankful for services.

## 2014-10-20 NOTE — Progress Notes (Signed)
EDCM called [facility] and spoke to [place] who confirms home health referral has been sent and patient is scheduled to be seen tomorrow.  EDCM called patient's son Gerlene BurdockRichard and inofrmed him of this information.  Patient's son thankful for services.  No further EDCM needs at this time.

## 2014-10-26 ENCOUNTER — Other Ambulatory Visit: Payer: Self-pay | Admitting: Internal Medicine

## 2014-10-28 ENCOUNTER — Telehealth: Payer: Self-pay | Admitting: *Deleted

## 2014-10-28 NOTE — Telephone Encounter (Signed)
Rhonda called with [facility] needing [facility] on [facility]. Told her whatever she needs is fine just send over and Dr.K will sign. Rhonda verbalized understanding and will be sending orders.

## 2014-11-01 ENCOUNTER — Ambulatory Visit: Payer: Medicare Other | Admitting: Internal Medicine

## 2014-11-03 ENCOUNTER — Telehealth: Payer: Self-pay | Admitting: Internal Medicine

## 2014-11-03 NOTE — Telephone Encounter (Signed)
FYI for OT  2 times a week for 1 week and 1 time a week for 1 week  For transfers and training and home exercise program

## 2014-11-04 NOTE — Telephone Encounter (Signed)
Left detailed message on personal voicemail for [facility], verbal order given for Occupational Therapy 2 times a week for 1 week and 1 time a week for 1 week for pt okay per Dr.K. Please call if any questions.

## 2014-11-08 ENCOUNTER — Ambulatory Visit (INDEPENDENT_AMBULATORY_CARE_PROVIDER_SITE_OTHER): Payer: Medicare Other | Admitting: *Deleted

## 2014-11-08 DIAGNOSIS — I480 Paroxysmal atrial fibrillation: Secondary | ICD-10-CM

## 2014-11-08 DIAGNOSIS — F0390 Unspecified dementia without behavioral disturbance: Secondary | ICD-10-CM | POA: Diagnosis not present

## 2014-11-08 NOTE — Progress Notes (Signed)
Remote pacemaker transmission.   

## 2014-11-10 LAB — MDC_IDC_ENUM_SESS_TYPE_REMOTE
Battery Remaining Longevity: 96 mo
Brady Statistic RV Percent Paced: 0 %
Lead Channel Impedance Value: 621 Ohm
Lead Channel Pacing Threshold Amplitude: 0.6 V
Lead Channel Sensing Intrinsic Amplitude: 4 mV
Lead Channel Setting Pacing Amplitude: 2 V
Lead Channel Setting Pacing Amplitude: 2.4 V
Lead Channel Setting Sensing Sensitivity: 2.5 mV
MDC IDC MSMT BATTERY REMAINING PERCENTAGE: 100 %
MDC IDC MSMT LEADCHNL RA PACING THRESHOLD PULSEWIDTH: 0.4 ms
MDC IDC MSMT LEADCHNL RV IMPEDANCE VALUE: 849 Ohm
MDC IDC MSMT LEADCHNL RV SENSING INTR AMPL: 10.5 mV
MDC IDC PG SERIAL: 123684
MDC IDC SESS DTM: 20160308053700
MDC IDC SET LEADCHNL RV PACING PULSEWIDTH: 0.4 ms
MDC IDC STAT BRADY RA PERCENT PACED: 38 %
Zone Setting Detection Interval: 375 ms

## 2014-11-11 ENCOUNTER — Telehealth: Payer: Self-pay | Admitting: Internal Medicine

## 2014-11-11 NOTE — Telephone Encounter (Signed)
Left message on voicemail to call office.  

## 2014-11-11 NOTE — Telephone Encounter (Signed)
Pt son would like a call back he said he has questions about an rx for alcohol swipes.

## 2014-11-14 NOTE — Telephone Encounter (Signed)
Spoke to pt's son Gerlene BurdockRichard, regarding alcohol wipes for glucose testing. He said pt is using [facility] for all her diabetic supplies including alcohol wipes. Told him fine, I received a fax from them will have Dr. Kirtland BouchardK sign for supplies and fax back to them. [name] verbalized understanding.

## 2014-11-17 ENCOUNTER — Encounter: Payer: Self-pay | Admitting: Cardiology

## 2014-11-23 ENCOUNTER — Encounter: Payer: Self-pay | Admitting: Internal Medicine

## 2014-12-02 ENCOUNTER — Emergency Department (HOSPITAL_COMMUNITY)
Admission: EM | Admit: 2014-12-02 | Discharge: 2014-12-02 | Discharge status: Against Medical Advice | Payer: Medicare Other | Attending: Emergency Medicine | Admitting: Emergency Medicine

## 2014-12-02 ENCOUNTER — Encounter (HOSPITAL_COMMUNITY): Payer: Self-pay | Admitting: Emergency Medicine

## 2014-12-02 ENCOUNTER — Telehealth: Payer: Self-pay | Admitting: Internal Medicine

## 2014-12-02 DIAGNOSIS — E119 Type 2 diabetes mellitus without complications: Secondary | ICD-10-CM | POA: Diagnosis not present

## 2014-12-02 DIAGNOSIS — I10 Essential (primary) hypertension: Secondary | ICD-10-CM | POA: Diagnosis not present

## 2014-12-02 DIAGNOSIS — N644 Mastodynia: Secondary | ICD-10-CM | POA: Insufficient documentation

## 2014-12-02 LAB — BASIC METABOLIC PANEL
Anion gap: 13 (ref 5–15)
BUN: 19 mg/dL (ref 6–23)
CO2: 25 mmol/L (ref 19–32)
Calcium: 10.1 mg/dL (ref 8.4–10.5)
Chloride: 103 mmol/L (ref 96–112)
Creatinine, Ser: 0.84 mg/dL (ref 0.50–1.10)
GFR calc Af Amer: 69 mL/min — ABNORMAL LOW (ref >90)
GFR calc non Af Amer: 60 mL/min — ABNORMAL LOW (ref >90)
Glucose, Bld: 104 mg/dL — ABNORMAL HIGH (ref 70–99)
POTASSIUM: 4.2 mmol/L (ref 3.5–5.1)
SODIUM: 141 mmol/L (ref 135–145)

## 2014-12-02 LAB — CBC
HEMATOCRIT: 30 % — AB (ref 36.0–46.0)
HEMOGLOBIN: 8.8 g/dL — AB (ref 12.0–15.0)
MCH: 23.3 pg — AB (ref 26.0–34.0)
MCHC: 29.3 g/dL — ABNORMAL LOW (ref 30.0–36.0)
MCV: 79.6 fL (ref 78.0–100.0)
PLATELETS: 352 10*3/uL (ref 150–400)
RBC: 3.77 MIL/uL — ABNORMAL LOW (ref 3.87–5.11)
RDW: 15.4 % (ref 11.5–15.5)
WBC: 10.9 10*3/uL — ABNORMAL HIGH (ref 4.0–10.5)

## 2014-12-02 LAB — I-STAT TROPONIN, ED: TROPONIN I, POC: 0 ng/mL (ref 0.00–0.08)

## 2014-12-02 NOTE — ED Notes (Addendum)
Per pt, states left breast pain that has been going on for "awhile"-has seen PCP but has not diagnosed anything-states she has pacemaker and it gets adjusted remotely-son does not know what to do or what is causing his mothers discomfort-mother states it might be her "nerves"

## 2014-12-02 NOTE — ED Notes (Signed)
Pt called for room, no answer from lobby 

## 2014-12-02 NOTE — Telephone Encounter (Signed)
[facility] - Client TELEPHONE ADVICE RECORD [facility] Patient Name: Cindy SheetsLUCILLE [name] DOB: 10/06/1922 Initial Comment Caller states his mother is having headaches, Nurse Assessment Nurse: Ladona RidgelGaddy, RN, Felicia Date/Time (Eastern Time): 12/02/2014 3:50:40 PM Confirm and document reason for call. If symptomatic, describe symptoms. ---Pt has HA's ongoingly for more than a year. Her MD is aware he believes of the HA's and trouble she has under her L breast. Son concerned that there is no plan for this pain under L Breast - She points under her L breast. He feels it has not been addressed. She has a pacemaker. Has the patient traveled out of the country within the last 30 days? ---No Does the patient require triage? ---Yes Related visit to physician within the last 2 weeks? ---No Does the PT have any chronic conditions? (i.e. diabetes, asthma, etc.) ---YesList chronic conditions. ---has a pacemaker - DM was on metformin and it was giving her diarrhea per son - MD said take her off metformin for 4 d but they never restarted it. Asked him to check blood sugar Guidelines Guideline Title Affirmed Question Affirmed Notes Chest Pain Dizziness or lightheadedness Final Disposition User Go to ED Now Gaddy, RN, Sunny SchleinFelicia Comments was on hold for probably more than 5 min. while he was looking for her lancets. Hung up for no response - will call back she has beginnings of dementia he was never able to find lancets to check blood sugar -

## 2014-12-05 NOTE — Telephone Encounter (Signed)
Noted  

## 2014-12-07 ENCOUNTER — Other Ambulatory Visit: Payer: Self-pay | Admitting: Internal Medicine

## 2014-12-24 ENCOUNTER — Emergency Department (HOSPITAL_COMMUNITY): Payer: Medicare Other

## 2014-12-24 ENCOUNTER — Emergency Department (HOSPITAL_COMMUNITY)
Admission: EM | Admit: 2014-12-24 | Discharge: 2014-12-24 | Disposition: A | Discharge status: Home / Self Care | Payer: Medicare Other | Attending: Emergency Medicine | Admitting: Emergency Medicine

## 2014-12-24 ENCOUNTER — Encounter (HOSPITAL_COMMUNITY): Payer: Self-pay | Admitting: Emergency Medicine

## 2014-12-24 DIAGNOSIS — E119 Type 2 diabetes mellitus without complications: Secondary | ICD-10-CM | POA: Diagnosis not present

## 2014-12-24 DIAGNOSIS — Z880 Allergy status to penicillin: Secondary | ICD-10-CM | POA: Diagnosis not present

## 2014-12-24 DIAGNOSIS — E785 Hyperlipidemia, unspecified: Secondary | ICD-10-CM | POA: Diagnosis not present

## 2014-12-24 DIAGNOSIS — Z8669 Personal history of other diseases of the nervous system and sense organs: Secondary | ICD-10-CM | POA: Insufficient documentation

## 2014-12-24 DIAGNOSIS — F419 Anxiety disorder, unspecified: Secondary | ICD-10-CM | POA: Diagnosis not present

## 2014-12-24 DIAGNOSIS — Z79899 Other long term (current) drug therapy: Secondary | ICD-10-CM | POA: Insufficient documentation

## 2014-12-24 DIAGNOSIS — I10 Essential (primary) hypertension: Secondary | ICD-10-CM | POA: Insufficient documentation

## 2014-12-24 DIAGNOSIS — Z87442 Personal history of urinary calculi: Secondary | ICD-10-CM | POA: Diagnosis not present

## 2014-12-24 DIAGNOSIS — Z862 Personal history of diseases of the blood and blood-forming organs and certain disorders involving the immune mechanism: Secondary | ICD-10-CM | POA: Diagnosis not present

## 2014-12-24 DIAGNOSIS — R079 Chest pain, unspecified: Secondary | ICD-10-CM | POA: Diagnosis present

## 2014-12-24 DIAGNOSIS — F329 Major depressive disorder, single episode, unspecified: Secondary | ICD-10-CM | POA: Insufficient documentation

## 2014-12-24 DIAGNOSIS — R1013 Epigastric pain: Secondary | ICD-10-CM | POA: Diagnosis not present

## 2014-12-24 DIAGNOSIS — Z8719 Personal history of other diseases of the digestive system: Secondary | ICD-10-CM | POA: Diagnosis not present

## 2014-12-24 LAB — CBC
HEMATOCRIT: 29.5 % — AB (ref 36.0–46.0)
Hemoglobin: 8.6 g/dL — ABNORMAL LOW (ref 12.0–15.0)
MCH: 22.9 pg — ABNORMAL LOW (ref 26.0–34.0)
MCHC: 29.2 g/dL — ABNORMAL LOW (ref 30.0–36.0)
MCV: 78.5 fL (ref 78.0–100.0)
Platelets: 316 10*3/uL (ref 150–400)
RBC: 3.76 MIL/uL — ABNORMAL LOW (ref 3.87–5.11)
RDW: 15.5 % (ref 11.5–15.5)
WBC: 9 10*3/uL (ref 4.0–10.5)

## 2014-12-24 LAB — BASIC METABOLIC PANEL
Anion gap: 9 (ref 5–15)
BUN: 17 mg/dL (ref 6–23)
CO2: 24 mmol/L (ref 19–32)
CREATININE: 0.87 mg/dL (ref 0.50–1.10)
Calcium: 9.1 mg/dL (ref 8.4–10.5)
Chloride: 107 mmol/L (ref 96–112)
GFR calc Af Amer: 66 mL/min — ABNORMAL LOW (ref >90)
GFR, EST NON AFRICAN AMERICAN: 57 mL/min — AB (ref >90)
Glucose, Bld: 112 mg/dL — ABNORMAL HIGH (ref 70–99)
Potassium: 4 mmol/L (ref 3.5–5.1)
Sodium: 140 mmol/L (ref 135–145)

## 2014-12-24 LAB — I-STAT TROPONIN, ED: Troponin i, poc: 0.01 ng/mL (ref 0.00–0.08)

## 2014-12-24 LAB — POC OCCULT BLOOD, ED: FECAL OCCULT BLD: NEGATIVE

## 2014-12-24 LAB — BRAIN NATRIURETIC PEPTIDE: B NATRIURETIC PEPTIDE 5: 133.3 pg/mL — AB (ref 0.0–100.0)

## 2014-12-24 MED ORDER — LISINOPRIL 10 MG PO TABS
10.0000 mg | ORAL_TABLET | Freq: Once | ORAL | Status: AC
  Administered 2014-12-24: 10 mg via ORAL
  Filled 2014-12-24: qty 1

## 2014-12-24 MED ORDER — LISINOPRIL 5 MG PO TABS: 10.0000 mg | ORAL_TABLET | Freq: Every day | ORAL | Status: DC

## 2014-12-24 NOTE — Discharge Instructions (Signed)
Abdominal Pain °Many things can cause abdominal pain. Usually, abdominal pain is not caused by a disease and will improve without treatment. It can often be observed and treated at home. Your health care provider will do a physical exam and possibly order blood tests and X-rays to help determine the seriousness of your pain. However, in many cases, more time must pass before a clear cause of the pain can be found. Before that point, your health care provider may not know if you need more testing or further treatment. °HOME CARE INSTRUCTIONS  °Monitor your abdominal pain for any changes. The following actions may help to alleviate any discomfort you are experiencing: °· Only take over-the-counter or prescription medicines as directed by your health care provider. °· Do not take laxatives unless directed to do so by your health care provider. °· Try a clear liquid diet (broth, tea, or water) as directed by your health care provider. Slowly move to a bland diet as tolerated. °SEEK MEDICAL CARE IF: °· You have unexplained abdominal pain. °· You have abdominal pain associated with nausea or diarrhea. °· You have pain when you urinate or have a bowel movement. °· You experience abdominal pain that wakes you in the night. °· You have abdominal pain that is worsened or improved by eating food. °· You have abdominal pain that is worsened with eating fatty foods. °· You have a fever. °SEEK IMMEDIATE MEDICAL CARE IF:  °· Your pain does not go away within 2 hours. °· You keep throwing up (vomiting). °· Your pain is felt only in portions of the abdomen, such as the right side or the left lower portion of the abdomen. °· You pass bloody or black tarry stools. °MAKE SURE YOU: °· Understand these instructions.   °· Will watch your condition.   °· Will get help right away if you are not doing well or get worse.   °Document Released: 05/29/2005 Document Revised: 08/24/2013 Document Reviewed: 04/28/2013 °ExitCare® Patient Information  ©2015 [employer]. This information is not intended to replace advice given to you by your health care provider. Make sure you discuss any questions you have with your health care provider. ° °Hypertension °Hypertension, commonly called high blood pressure, is when the force of blood pumping through your arteries is too strong. Your arteries are the blood vessels that carry blood from your heart throughout your body. A blood pressure reading consists of a higher number over a lower number, such as 110/72. The higher number (systolic) is the pressure inside your arteries when your heart pumps. The lower number (diastolic) is the pressure inside your arteries when your heart relaxes. Ideally you want your blood pressure below 120/80. °Hypertension forces your heart to work harder to pump blood. Your arteries may become narrow or stiff. Having hypertension puts you at risk for heart disease, stroke, and other problems.  °RISK FACTORS °Some risk factors for high blood pressure are controllable. Others are not.  °Risk factors you cannot control include:  °· Race. You may be at higher risk if you are African American. °· Age. Risk increases with age. °· Gender. Men are at higher risk than women before age 45 years. After age 65, women are at higher risk than men. °Risk factors you can control include: °· Not getting enough exercise or physical activity. °· Being overweight. °· Getting too much fat, sugar, calories, or salt in your diet. °· Drinking too much alcohol. °SIGNS AND SYMPTOMS °Hypertension does not usually cause signs or symptoms. Extremely high   blood pressure (hypertensive crisis) may cause headache, anxiety, shortness of breath, and nosebleed. °DIAGNOSIS  °To check if you have hypertension, your health care provider will measure your blood pressure while you are seated, with your arm held at the level of your heart. It should be measured at least twice using the same arm. Certain conditions can cause a  difference in blood pressure between your right and left arms. A blood pressure reading that is higher than normal on one occasion does not mean that you need treatment. If one blood pressure reading is high, ask your health care provider about having it checked again. °TREATMENT  °Treating high blood pressure includes making lifestyle changes and possibly taking medicine. Living a healthy lifestyle can help lower high blood pressure. You may need to change some of your habits. °Lifestyle changes may include: °· Following the DASH diet. This diet is high in fruits, vegetables, and whole grains. It is low in salt, red meat, and added sugars. °· Getting at least 2½ hours of brisk physical activity every week. °· Losing weight if necessary. °· Not smoking. °· Limiting alcoholic beverages. °· Learning ways to reduce stress. ° If lifestyle changes are not enough to get your blood pressure under control, your health care provider may prescribe medicine. You may need to take more than one. Work closely with your health care provider to understand the risks and benefits. °HOME CARE INSTRUCTIONS °· Have your blood pressure rechecked as directed by your health care provider.   °· Take medicines only as directed by your health care provider. Follow the directions carefully. Blood pressure medicines must be taken as prescribed. The medicine does not work as well when you skip doses. Skipping doses also puts you at risk for problems.   °· Do not smoke.   °· Monitor your blood pressure at home as directed by your health care provider.  °SEEK MEDICAL CARE IF:  °· You think you are having a reaction to medicines taken. °· You have recurrent headaches or feel dizzy. °· You have swelling in your ankles. °· You have trouble with your vision. °SEEK IMMEDIATE MEDICAL CARE IF: °· You develop a severe headache or confusion. °· You have unusual weakness, numbness, or feel faint. °· You have severe chest or abdominal pain. °· You vomit  repeatedly. °· You have trouble breathing. °MAKE SURE YOU:  °· Understand these instructions. °· Will watch your condition. °· Will get help right away if you are not doing well or get worse. °Document Released: 08/19/2005 Document Revised: 01/03/2014 Document Reviewed: 06/11/2013 °ExitCare® Patient Information ©2015 [employer]. This information is not intended to replace advice given to you by your health care provider. Make sure you discuss any questions you have with your health care provider. ° °

## 2014-12-24 NOTE — ED Notes (Signed)
Pt c/o central CP since this morning.  Pt very anxious in triage.  Describes the pain as a knife sticking in her chest.

## 2014-12-24 NOTE — ED Notes (Signed)
Nurse currently starting IV 

## 2014-12-24 NOTE — ED Provider Notes (Signed)
CSN: [id number]     Arrival date & time 12/24/14  1207 History   First MD Initiated Contact with Patient 12/24/14 1209     Chief Complaint  Patient presents with  . Chest Pain     (Consider location/radiation/quality/duration/timing/severity/associated sxs/prior Treatment) HPI the patient had epigastric pain earlier today. At the time of my evaluation she reports is gone. She states at this point I can he remember really having the pain. The patient's son is with her. He reports at the time that she was having and he did not think it was heart related. He did not note her to be having any kind of sweating, pallor, dyspnea or unwell appearance. He reports she has been getting some intermittent pain behind her left breast is been coming and going for a while. He reports they've had no specific diagnosis for her. The patient's anemia. He reports that has been chronic in nature. She has not had any vomiting and they have not noted any specifically dark looking stool. They deny she never had a GI bleed in the past. At this point the patient is asymptomatic and reports she is ready to go home. Past Medical History  Diagnosis Date  . ANEMIA, DEFICIENCY, HX OF 03/01/2010  . DEPRESSIVE DISORDER 09/19/2010  . DM w/o Complication Type II 05/18/2007  . GERD 05/18/2007  . HYPERLIPIDEMIA 03/17/2008  . HYPERTENSION 05/18/2007  . INSOMNIA 11/23/2008  . Loss of weight 05/30/2010  . Kidney stone   . Glaucoma   . Anxiety    Past Surgical History  Procedure Laterality Date  . Abdominal hysterectomy    . Cataract extraction    . Hemorrhoid surgery    . Appendectomy    . Pacemaker insertion  09/2011  . Dilation and curettage of uterus    . Colonoscopy  06-28-10    per Dr. Arlyce DiceKaplan, diverticulosis only   . Esophagogastroduodenoscopy  05-13-11    per Dr. Arlyce DiceKaplan, normal   . Permanent pacemaker insertion N/A 09/30/2011    Procedure: PERMANENT PACEMAKER INSERTION;  Surgeon: Marinus MawGregg W Taylor, MD;  [facility];   Service: Cardiovascular;  Laterality: N/A;   Family History  Problem Relation Age of Onset  . Brain cancer Father   . Colon cancer Sister   . Colitis Brother   . Heart attack Brother    History  Substance Use Topics  . Smoking status: Never Smoker   . Smokeless tobacco: Never Used  . Alcohol Use: No   OB History    Gravida Para Term Preterm AB TAB SAB Ectopic Multiple Living            3     Review of Systems  10 Systems reviewed and are negative for acute change except as noted in the HPI.   Allergies  Amoxicillin and Penicillins  Home Medications   Prior to Admission medications   Medication Sig Start Date End Date Taking? Authorizing Provider  acetaminophen (TYLENOL) 500 MG tablet Take 500-1,000 mg by mouth every 6 (six) hours as needed for moderate pain (pain). For pain   Yes Historical Provider, MD  famotidine (PEPCID) 20 MG tablet TAKE 1 TABLET BY MOUTH TWICE A DAY 07/18/14  Yes Gordy SaversPeter F Kwiatkowski, MD  furosemide (LASIX) 20 MG tablet Take 1 tablet (20 mg total) by mouth daily. 01/12/14  Yes Gordy SaversPeter F Kwiatkowski, MD  LORazepam (ATIVAN) 0.5 MG tablet TAKE 1 TABLET BY MOUTH 3 TIMES A DAY AS NEEDED 08/31/14  Yes Gordy SaversPeter F Kwiatkowski,  MD  sertraline (ZOLOFT) 50 MG tablet TAKE 1 TABLET BY MOUTH EVERY DAY 10/27/14  Yes Gordy Savers, MD  simvastatin (ZOCOR) 80 MG tablet Take 0.5 tablets (40 mg total) by mouth at bedtime. 06/04/13  Yes Gordy Savers, MD  traMADol (ULTRAM) 50 MG tablet TAKE 1 TABLET BY MOUTH EVERY 8 HOURS AS NEEDED FOR PAIN 10/10/14  Yes Gordy Savers, MD  lisinopril (PRINIVIL,ZESTRIL) 5 MG tablet Take 2 tablets (10 mg total) by mouth daily. 12/24/14   Arby Barrette, MD  LORazepam (ATIVAN) 0.5 MG tablet TAKE 1 TABLET BY MOUTH 3 TIMES A DAY AS NEEDED Patient not taking: Reported on 12/24/2014 12/09/14   Gordy Savers, MD  metFORMIN (GLUCOPHAGE-XR) 500 MG 24 hr tablet TAKE 1 TABLET BY MOUTH EVERY DAY Patient not taking: Reported on 10/18/2014  07/11/14   Gordy Savers, MD   BP 192/65 mmHg  Pulse 60  Temp(Src) 97.4 F (36.3 C) (Oral)  Resp 20  SpO2 100% Physical Exam  Constitutional: She appears well-developed and well-nourished.  The patient is pleasant and alert. She does not appear to be any distress. She has no respiratory distress. Her color is good. At this time she is pain-free.  HENT:  Head: Normocephalic and atraumatic.  Eyes: EOM are normal. Pupils are equal, round, and reactive to light.  Neck: Neck supple.  Cardiovascular: Normal rate, regular rhythm, normal heart sounds and intact distal pulses.   Pulmonary/Chest: Effort normal and breath sounds normal.  Abdominal: Soft. Bowel sounds are normal. She exhibits no distension. There is no tenderness.  Genitourinary:  Rectal examination performed brown stool in the vault. No melena.  Musculoskeletal: Normal range of motion. She exhibits no edema.  Neurological: She is alert. She has normal strength. Coordination normal. GCS eye subscore is 4. GCS verbal subscore is 5. GCS motor subscore is 6.  Skin: Skin is warm, dry and intact.  Psychiatric: She has a normal mood and affect.    ED Course  Procedures (including critical care time) Labs Review Labs Reviewed  CBC - Abnormal; Notable for the following:    RBC 3.76 (*)    Hemoglobin 8.6 (*)    HCT 29.5 (*)    MCH 22.9 (*)    MCHC 29.2 (*)    All other components within normal limits  BASIC METABOLIC PANEL - Abnormal; Notable for the following:    Glucose, Bld 112 (*)    GFR calc non Af Amer 57 (*)    GFR calc Af Amer 66 (*)    All other components within normal limits  BRAIN NATRIURETIC PEPTIDE - Abnormal; Notable for the following:    B Natriuretic Peptide 133.3 (*)    All other components within normal limits  I-STAT TROPOININ, ED  POC OCCULT BLOOD, ED    Imaging Review Dg Chest Port 1 View  12/24/2014   CLINICAL DATA:  Epigastric pain. Onset of symptoms this morning. Chest pain.  EXAM: PORTABLE  CHEST - 1 VIEW  COMPARISON:  12/11/2012.  FINDINGS: The patient is rotated to the LEFT. Dual lead cardiac pacemaker is present. Visible leads appear unchanged. The RIGHT ventricular apex lead is excluded off the inferior margin of the film. Allowing for rotation, the cardiopericardial silhouette is within normal limits. Lungs appear clear. Monitoring leads project over the chest.  IMPRESSION: No active cardiopulmonary disease.  Patient rotated to the LEFT.   Electronically Signed   By: Andreas Newport M.D.   On: 12/24/2014 13:14  EKG Interpretation   Date/Time:  Saturday December 24 2014 12:23:42 EDT Ventricular Rate:  60 PR Interval:  212 QRS Duration: 132 QT Interval:  456 QTC Calculation: 456 R Axis:   -39 Text Interpretation:  Electronic atrial pacemaker Left axis deviation  Right bundle branch block Abnormal ECG agree. no significant change from  old Confirmed by Donnald Garre, MD, Lebron Conners [id number]) on 12/24/2014 12:35:47 PM      MDM   Final diagnoses:  Epigastric pain  Essential hypertension   The patient's pain has resolved by the time of my evaluation. Her EKG and troponins did not indicate an acute ischemic event. The patient does have a pacemaker. The patient's son reports that she has been fairly preoccupied with the pacemaker and anxious about it, he is not sure why she is, there have been no problems associated with it. The patient does have anemia which appears to be rather chronic in nature. She tolerates that well. There is no instability and her vital signs. Her rectal exam is heme negative. The patient does have hypertensive blood pressures. By review of the electronic medical record it does not appear that she is on a antihypertensive medication. She however is not showing any acute ischemic changes or encephalopathic changes. I will initiate the patient on lisinopril and instructed for follow-up at the beginning of the week. At this time the patient is very emphatic about wanting  to go home, her son lives home with her and is a reliable caregiver.    Arby Barrette, MD 12/24/14 [phone]

## 2014-12-31 ENCOUNTER — Other Ambulatory Visit: Payer: Self-pay | Admitting: Internal Medicine

## 2015-01-09 ENCOUNTER — Other Ambulatory Visit: Payer: Self-pay | Admitting: Internal Medicine

## 2015-01-09 ENCOUNTER — Ambulatory Visit (INDEPENDENT_AMBULATORY_CARE_PROVIDER_SITE_OTHER): Payer: Medicare Other | Admitting: Internal Medicine

## 2015-01-09 ENCOUNTER — Encounter: Payer: Self-pay | Admitting: Internal Medicine

## 2015-01-09 VITALS — BP 126/74 | HR 76 | Temp 98.8°F | Resp 18 | Ht 63.0 in | Wt 150.0 lb

## 2015-01-09 DIAGNOSIS — I480 Paroxysmal atrial fibrillation: Secondary | ICD-10-CM | POA: Diagnosis not present

## 2015-01-09 DIAGNOSIS — I10 Essential (primary) hypertension: Secondary | ICD-10-CM | POA: Diagnosis not present

## 2015-01-09 DIAGNOSIS — D638 Anemia in other chronic diseases classified elsewhere: Secondary | ICD-10-CM

## 2015-01-09 DIAGNOSIS — E119 Type 2 diabetes mellitus without complications: Secondary | ICD-10-CM | POA: Diagnosis not present

## 2015-01-09 DIAGNOSIS — Z23 Encounter for immunization: Secondary | ICD-10-CM

## 2015-01-09 LAB — FERRITIN: Ferritin: 6.4 ng/mL — ABNORMAL LOW (ref 10.0–291.0)

## 2015-01-09 LAB — CBC WITH DIFFERENTIAL/PLATELET
BASOS PCT: 0.1 % (ref 0.0–3.0)
Basophils Absolute: 0 10*3/uL (ref 0.0–0.1)
EOS ABS: 0 10*3/uL (ref 0.0–0.7)
EOS PCT: 0 % (ref 0.0–5.0)
HCT: 28.7 % — ABNORMAL LOW (ref 36.0–46.0)
Hemoglobin: 8.9 g/dL — ABNORMAL LOW (ref 12.0–15.0)
LYMPHS PCT: 11.9 % — AB (ref 12.0–46.0)
Lymphs Abs: 1.6 10*3/uL (ref 0.7–4.0)
MCHC: 30.8 g/dL (ref 30.0–36.0)
MCV: 74.1 fl — ABNORMAL LOW (ref 78.0–100.0)
Monocytes Absolute: 0.6 10*3/uL (ref 0.1–1.0)
Monocytes Relative: 4.6 % (ref 3.0–12.0)
NEUTROS ABS: 11 10*3/uL — AB (ref 1.4–7.7)
Neutrophils Relative %: 83.4 % — ABNORMAL HIGH (ref 43.0–77.0)
Platelets: 363 10*3/uL (ref 150.0–400.0)
RBC: 3.88 Mil/uL (ref 3.87–5.11)
RDW: 16.8 % — ABNORMAL HIGH (ref 11.5–15.5)
WBC: 13.2 10*3/uL — AB (ref 4.0–10.5)

## 2015-01-09 LAB — HEMOGLOBIN A1C: Hgb A1c MFr Bld: 6.1 % (ref 4.6–6.5)

## 2015-01-09 NOTE — Addendum Note (Signed)
Addended by: Jimmye NormanPHANOS, Ryllie Nieland J on: 01/09/2015 04:36 PM   Modules accepted: Orders

## 2015-01-09 NOTE — Progress Notes (Signed)
Pre visit review using our clinic review tool, if applicable. No additional management support is needed unless otherwise documented below in the visit note. 

## 2015-01-09 NOTE — Patient Instructions (Addendum)
Return in 3 months for follow-up  Take an iron tablet once daily

## 2015-01-09 NOTE — Progress Notes (Signed)
Subjective:    Patient ID: [name], female    DOB: February 05, 1923, 81 y.o.   MRN: [id number]  HPI  81 year old patient who is seen today in follow-up.  Since her last visit here, she has had 2 ED visits. ED records reviewed Today she is doing well.  She does have type 2 diabetes but has not been on metformin therapy in several months.  Hemoglobin A1c's have been in a nondiabetic range.  Home blood sugar monitoring has revealed the nice glycemic control She presented to the ED 1 visit for epigastric pain She has treated hypertension, dyslipidemia and anxiety  Laboratory studies reviewed.  This revealed worsening anemia.  She did have full GI evaluation in 2011 Stool was negative for occult blood  Past Medical History  Diagnosis Date  . ANEMIA, DEFICIENCY, HX OF 03/01/2010  . DEPRESSIVE DISORDER 09/19/2010  . DM w/o Complication Type II 05/18/2007  . GERD 05/18/2007  . HYPERLIPIDEMIA 03/17/2008  . HYPERTENSION 05/18/2007  . INSOMNIA 11/23/2008  . Loss of weight 05/30/2010  . Kidney stone   . Glaucoma   . Anxiety     History   Social History  . Marital Status: Widowed    Spouse Name: N/A  . Number of Children: 3  . Years of Education: N/A   Occupational History  . retired    Social History Main Topics  . Smoking status: Never Smoker   . Smokeless tobacco: Never Used  . Alcohol Use: No  . Drug Use: No  . Sexual Activity: Not on file   Other Topics Concern  . Not on file   Social History Narrative    Past Surgical History  Procedure Laterality Date  . Abdominal hysterectomy    . Cataract extraction    . Hemorrhoid surgery    . Appendectomy    . Pacemaker insertion  09/2011  . Dilation and curettage of uterus    . Colonoscopy  06-28-10    per Dr. Arlyce DiceKaplan, diverticulosis only   . Esophagogastroduodenoscopy  05-13-11    per Dr. Arlyce DiceKaplan, normal   . Permanent pacemaker insertion N/A 09/30/2011    Procedure: PERMANENT PACEMAKER INSERTION;  Surgeon: Marinus MawGregg W Taylor,  MD;  [facility];  Service: Cardiovascular;  Laterality: N/A;    Family History  Problem Relation Age of Onset  . Brain cancer Father   . Colon cancer Sister   . Colitis Brother   . Heart attack Brother     Allergies  Allergen Reactions  . Amoxicillin Rash  . Penicillins Rash    Current Outpatient Prescriptions on File Prior to Visit  Medication Sig Dispense Refill  . acetaminophen (TYLENOL) 500 MG tablet Take 500-1,000 mg by mouth every 6 (six) hours as needed for moderate pain (pain). For pain    . furosemide (LASIX) 20 MG tablet TAKE 1 TABLET BY MOUTH EVERY DAY 90 tablet 1  . lisinopril (PRINIVIL,ZESTRIL) 5 MG tablet Take 2 tablets (10 mg total) by mouth daily. 60 tablet 0  . LORazepam (ATIVAN) 0.5 MG tablet TAKE 1 TABLET BY MOUTH 3 TIMES A DAY AS NEEDED 90 tablet 2  . metFORMIN (GLUCOPHAGE-XR) 500 MG 24 hr tablet TAKE 1 TABLET BY MOUTH EVERY DAY 90 tablet 2  . sertraline (ZOLOFT) 50 MG tablet TAKE 1 TABLET BY MOUTH EVERY DAY 90 tablet 1  . simvastatin (ZOCOR) 80 MG tablet Take 0.5 tablets (40 mg total) by mouth at bedtime. 90 tablet 1  . traMADol (ULTRAM) 50 MG  tablet TAKE 1 TABLET BY MOUTH EVERY 8 HOURS AS NEEDED FOR PAIN 90 tablet 2   No current facility-administered medications on file prior to visit.    BP 126/74 mmHg  Pulse 76  Temp(Src) 98.8 F (37.1 C) (Oral)  Resp 18  Ht 5\' 3"  (1.6 m)  Wt 150 lb (68.04 kg)  BMI 26.58 kg/m2     Review of Systems  Constitutional: Negative.   HENT: Negative for congestion, dental problem, hearing loss, rhinorrhea, sinus pressure, sore throat and tinnitus.   Eyes: Negative for pain, discharge and visual disturbance.  Respiratory: Negative for cough and shortness of breath.   Cardiovascular: Positive for chest pain. Negative for palpitations and leg swelling.  Gastrointestinal: Positive for abdominal pain. Negative for nausea, vomiting, diarrhea, constipation, blood in stool and abdominal distention.  Genitourinary:  Negative for dysuria, urgency, frequency, hematuria, flank pain, vaginal bleeding, vaginal discharge, difficulty urinating, vaginal pain and pelvic pain.  Musculoskeletal: Negative for joint swelling, arthralgias and gait problem.  Skin: Negative for rash.  Neurological: Negative for dizziness, syncope, speech difficulty, weakness, numbness and headaches.  Hematological: Negative for adenopathy.  Psychiatric/Behavioral: Positive for confusion. Negative for behavioral problems, dysphoric mood and agitation. The patient is nervous/anxious.        Objective:   Physical Exam  Constitutional: She is oriented to person, place, and time. She appears well-developed and well-nourished.  Repeat blood pressure 134/74  HENT:  Head: Normocephalic.  Right Ear: External ear normal.  Left Ear: External ear normal.  Mouth/Throat: Oropharynx is clear and moist.  Eyes: Conjunctivae and EOM are normal. Pupils are equal, round, and reactive to light.  Neck: Normal range of motion. Neck supple. No thyromegaly present.  Cardiovascular: Normal rate, regular rhythm, normal heart sounds and intact distal pulses.   Pulmonary/Chest: Effort normal and breath sounds normal.  Abdominal: Soft. Bowel sounds are normal. She exhibits no distension and no mass. There is no tenderness. There is no rebound and no guarding.  Musculoskeletal: Normal range of motion.  Lymphadenopathy:    She has no cervical adenopathy.  Neurological: She is alert and oriented to person, place, and time.  Skin: Skin is warm and dry. No rash noted.  Psychiatric: She has a normal mood and affect. Her behavior is normal.          Assessment & Plan:   Diabetes mellitus.  Patient has been off metformin.  Will check a hemoglobin A1c Hypertension, controlled Anxiety disorder History of epigastric pain.  Stable History of atypical chest pain, stable Dyslipidemia.  Continue statin therapy Worsening anemia.  Will check a CBC  Recheck 3  months

## 2015-02-07 ENCOUNTER — Other Ambulatory Visit: Payer: Self-pay | Admitting: Internal Medicine

## 2015-02-07 ENCOUNTER — Ambulatory Visit (INDEPENDENT_AMBULATORY_CARE_PROVIDER_SITE_OTHER): Payer: Medicare Other | Admitting: Internal Medicine

## 2015-02-07 ENCOUNTER — Encounter: Payer: Self-pay | Admitting: Internal Medicine

## 2015-02-07 ENCOUNTER — Ambulatory Visit (INDEPENDENT_AMBULATORY_CARE_PROVIDER_SITE_OTHER): Payer: Medicare Other | Admitting: *Deleted

## 2015-02-07 VITALS — BP 162/90 | HR 82 | Temp 99.2°F | Resp 18 | Ht 63.0 in | Wt 144.0 lb

## 2015-02-07 DIAGNOSIS — I480 Paroxysmal atrial fibrillation: Secondary | ICD-10-CM | POA: Diagnosis not present

## 2015-02-07 DIAGNOSIS — I10 Essential (primary) hypertension: Secondary | ICD-10-CM | POA: Diagnosis not present

## 2015-02-07 DIAGNOSIS — E119 Type 2 diabetes mellitus without complications: Secondary | ICD-10-CM

## 2015-02-07 DIAGNOSIS — Z862 Personal history of diseases of the blood and blood-forming organs and certain disorders involving the immune mechanism: Secondary | ICD-10-CM | POA: Diagnosis not present

## 2015-02-07 LAB — CBC WITH DIFFERENTIAL/PLATELET
BASOS PCT: 0 % (ref 0.0–3.0)
Basophils Absolute: 0 10*3/uL (ref 0.0–0.1)
Eosinophils Absolute: 0 10*3/uL (ref 0.0–0.7)
Eosinophils Relative: 0 % (ref 0.0–5.0)
HCT: 33 % — ABNORMAL LOW (ref 36.0–46.0)
HEMOGLOBIN: 10.1 g/dL — AB (ref 12.0–15.0)
LYMPHS ABS: 1.4 10*3/uL (ref 0.7–4.0)
LYMPHS PCT: 10.6 % — AB (ref 12.0–46.0)
MCHC: 30.7 g/dL (ref 30.0–36.0)
MCV: 73.9 fl — AB (ref 78.0–100.0)
MONOS PCT: 4.2 % (ref 3.0–12.0)
Monocytes Absolute: 0.5 10*3/uL (ref 0.1–1.0)
NEUTROS ABS: 11 10*3/uL — AB (ref 1.4–7.7)
Neutrophils Relative %: 85.2 % — ABNORMAL HIGH (ref 43.0–77.0)
PLATELETS: 358 10*3/uL (ref 150.0–400.0)
RBC: 4.47 Mil/uL (ref 3.87–5.11)
RDW: 17.4 % — ABNORMAL HIGH (ref 11.5–15.5)
WBC: 12.9 10*3/uL — AB (ref 4.0–10.5)

## 2015-02-07 NOTE — Patient Instructions (Signed)
Please check your hemoglobin A1c every 3 months  Limit your sodium (Salt) intake  Resume lisinopril

## 2015-02-07 NOTE — Progress Notes (Signed)
Pre visit review using our clinic review tool, if applicable. No additional management support is needed unless otherwise documented below in the visit note. 

## 2015-02-07 NOTE — Progress Notes (Signed)
Subjective:    Patient ID: [name], female    DOB: 17-Sep-1922, 81 y.o.   MRN: [id number]  HPI  81 year old patient who is seen today for follow-up of anemia.  For the past 2 days she has had headache and low-grade fever and has felt a bit unwell.  Her son misunderstood directions and patient has not been on lisinopril.  Metformin therapy is on hold. No melena Epigastric pain has resolved  Past Medical History  Diagnosis Date  . ANEMIA, DEFICIENCY, HX OF 03/01/2010  . DEPRESSIVE DISORDER 09/19/2010  . DM w/o Complication Type II 05/18/2007  . GERD 05/18/2007  . HYPERLIPIDEMIA 03/17/2008  . HYPERTENSION 05/18/2007  . INSOMNIA 11/23/2008  . Loss of weight 05/30/2010  . Kidney stone   . Glaucoma   . Anxiety     History   Social History  . Marital Status: Widowed    Spouse Name: N/A  . Number of Children: 3  . Years of Education: N/A   Occupational History  . retired    Social History Main Topics  . Smoking status: Never Smoker   . Smokeless tobacco: Never Used  . Alcohol Use: No  . Drug Use: No  . Sexual Activity: Not on file   Other Topics Concern  . Not on file   Social History Narrative    Past Surgical History  Procedure Laterality Date  . Abdominal hysterectomy    . Cataract extraction    . Hemorrhoid surgery    . Appendectomy    . Pacemaker insertion  09/2011  . Dilation and curettage of uterus    . Colonoscopy  06-28-10    per Dr. Arlyce Dice, diverticulosis only   . Esophagogastroduodenoscopy  05-13-11    per Dr. Arlyce Dice, normal   . Permanent pacemaker insertion N/A 09/30/2011    Procedure: PERMANENT PACEMAKER INSERTION;  Surgeon: Marinus Maw, MD;  Location: [facility] CATH LAB;  Service: Cardiovascular;  Laterality: N/A;    Family History  Problem Relation Age of Onset  . Brain cancer Father   . Colon cancer Sister   . Colitis Brother   . Heart attack Brother     Allergies  Allergen Reactions  . Amoxicillin Rash  . Penicillins Rash    Current  Outpatient Prescriptions on File Prior to Visit  Medication Sig Dispense Refill  . acetaminophen (TYLENOL) 500 MG tablet Take 500-1,000 mg by mouth every 6 (six) hours as needed for moderate pain (pain). For pain    . famotidine (PEPCID) 20 MG tablet TAKE 1 TABLET BY MOUTH TWICE A DAY 180 tablet 1  . furosemide (LASIX) 20 MG tablet TAKE 1 TABLET BY MOUTH EVERY DAY 90 tablet 1  . lisinopril (PRINIVIL,ZESTRIL) 5 MG tablet Take 2 tablets (10 mg total) by mouth daily. 60 tablet 0  . LORazepam (ATIVAN) 0.5 MG tablet TAKE 1 TABLET BY MOUTH 3 TIMES A DAY AS NEEDED 90 tablet 2  . sertraline (ZOLOFT) 50 MG tablet TAKE 1 TABLET BY MOUTH EVERY DAY 90 tablet 1  . simvastatin (ZOCOR) 80 MG tablet Take 0.5 tablets (40 mg total) by mouth at bedtime. 90 tablet 1  . traMADol (ULTRAM) 50 MG tablet TAKE 1 TABLET BY MOUTH EVERY 8 HOURS AS NEEDED FOR PAIN 90 tablet 2  . metFORMIN (GLUCOPHAGE-XR) 500 MG 24 hr tablet TAKE 1 TABLET BY MOUTH EVERY DAY (Patient not taking: Reported on 02/07/2015) 90 tablet 2   No current facility-administered medications on file prior to visit.  BP 162/90 mmHg  Pulse 82  Temp(Src) 99.2 F (37.3 C) (Oral)  Resp 18  Ht 5\' 3"  (1.6 m)  Wt 144 lb (65.318 kg)  BMI 25.51 kg/m2  SpO2 96%    Review of Systems  Constitutional: Positive for fever, appetite change and fatigue.  HENT: Positive for rhinorrhea. Negative for congestion, dental problem, hearing loss, sinus pressure, sore throat and tinnitus.   Eyes: Negative for pain, discharge and visual disturbance.  Respiratory: Negative for cough and shortness of breath.   Cardiovascular: Negative for chest pain, palpitations and leg swelling.  Gastrointestinal: Negative for nausea, vomiting, abdominal pain, diarrhea, constipation, blood in stool and abdominal distention.  Genitourinary: Negative for dysuria, urgency, frequency, hematuria, flank pain, vaginal bleeding, vaginal discharge, difficulty urinating, vaginal pain and pelvic  pain.  Musculoskeletal: Negative for joint swelling, arthralgias and gait problem.  Skin: Negative for rash.  Neurological: Positive for headaches. Negative for dizziness, syncope, speech difficulty, weakness and numbness.  Hematological: Negative for adenopathy.  Psychiatric/Behavioral: Negative for behavioral problems, dysphoric mood and agitation. The patient is not nervous/anxious.        Objective:   Physical Exam  Constitutional: She is oriented to person, place, and time. She appears well-developed and well-nourished.  Temperature 99.2 Blood pressure 150/90  HENT:  Head: Normocephalic.  Right Ear: External ear normal.  Left Ear: External ear normal.  Mouth/Throat: Oropharynx is clear and moist.  Eyes: Conjunctivae and EOM are normal. Pupils are equal, round, and reactive to light.  Neck: Normal range of motion. Neck supple. No thyromegaly present.  Cardiovascular: Normal rate, regular rhythm, normal heart sounds and intact distal pulses.   Pulmonary/Chest: Effort normal and breath sounds normal.  Abdominal: Soft. Bowel sounds are normal. She exhibits no mass. There is no tenderness.  Musculoskeletal: Normal range of motion.  Lymphadenopathy:    She has no cervical adenopathy.  Neurological: She is alert and oriented to person, place, and time.  Skin: Skin is warm and dry. No rash noted.  Psychiatric: She has a normal mood and affect. Her behavior is normal.          Assessment & Plan:   Anemia.  Will check a follow-up CBC.  Continue supplement, iron Essential hypertension.  Resume lisinopril Diabetes mellitus.  Continue to hold metformin.  Recheck hemoglobin A1c in 3 months Epigastric pain, resolved

## 2015-02-07 NOTE — Progress Notes (Signed)
Remote pacemaker transmission.   

## 2015-02-16 LAB — CUP PACEART REMOTE DEVICE CHECK
Battery Remaining Percentage: 100 %
Brady Statistic RA Percent Paced: 36 %
Brady Statistic RV Percent Paced: 0 %
Date Time Interrogation Session: 20160607043700
Lead Channel Impedance Value: 579 Ohm
Lead Channel Impedance Value: 853 Ohm
Lead Channel Setting Pacing Amplitude: 2 V
Lead Channel Setting Pacing Amplitude: 2.4 V
MDC IDC MSMT BATTERY REMAINING LONGEVITY: 96 mo
MDC IDC MSMT LEADCHNL RA PACING THRESHOLD AMPLITUDE: 0.6 V
MDC IDC MSMT LEADCHNL RA PACING THRESHOLD PULSEWIDTH: 0.4 ms
MDC IDC SET LEADCHNL RV PACING PULSEWIDTH: 0.4 ms
MDC IDC SET LEADCHNL RV SENSING SENSITIVITY: 2.5 mV
Pulse Gen Serial Number: 123684
Zone Setting Detection Interval: 375 ms

## 2015-02-17 ENCOUNTER — Other Ambulatory Visit: Payer: Self-pay | Admitting: Internal Medicine

## 2015-02-20 ENCOUNTER — Encounter: Payer: Self-pay | Admitting: Cardiology

## 2015-02-23 ENCOUNTER — Other Ambulatory Visit: Payer: Self-pay | Admitting: Internal Medicine

## 2015-03-01 ENCOUNTER — Encounter: Payer: Self-pay | Admitting: Internal Medicine

## 2015-03-16 ENCOUNTER — Other Ambulatory Visit: Payer: Self-pay | Admitting: Internal Medicine

## 2015-03-31 ENCOUNTER — Other Ambulatory Visit: Payer: Self-pay | Admitting: Internal Medicine

## 2015-04-30 ENCOUNTER — Other Ambulatory Visit: Payer: Self-pay | Admitting: Internal Medicine

## 2015-05-11 ENCOUNTER — Encounter: Payer: Self-pay | Admitting: Internal Medicine

## 2015-05-11 ENCOUNTER — Ambulatory Visit (INDEPENDENT_AMBULATORY_CARE_PROVIDER_SITE_OTHER): Payer: Medicare Other | Admitting: Internal Medicine

## 2015-05-11 VITALS — BP 130/56 | HR 84 | Temp 98.8°F | Ht 63.0 in | Wt 144.0 lb

## 2015-05-11 DIAGNOSIS — E785 Hyperlipidemia, unspecified: Secondary | ICD-10-CM | POA: Diagnosis not present

## 2015-05-11 DIAGNOSIS — E119 Type 2 diabetes mellitus without complications: Secondary | ICD-10-CM

## 2015-05-11 DIAGNOSIS — K219 Gastro-esophageal reflux disease without esophagitis: Secondary | ICD-10-CM | POA: Diagnosis not present

## 2015-05-11 DIAGNOSIS — I10 Essential (primary) hypertension: Secondary | ICD-10-CM

## 2015-05-11 DIAGNOSIS — Z862 Personal history of diseases of the blood and blood-forming organs and certain disorders involving the immune mechanism: Secondary | ICD-10-CM | POA: Diagnosis not present

## 2015-05-11 DIAGNOSIS — I480 Paroxysmal atrial fibrillation: Secondary | ICD-10-CM | POA: Diagnosis not present

## 2015-05-11 DIAGNOSIS — Z23 Encounter for immunization: Secondary | ICD-10-CM | POA: Diagnosis not present

## 2015-05-11 LAB — CBC WITH DIFFERENTIAL/PLATELET
BASOS ABS: 0 10*3/uL (ref 0.0–0.1)
Basophils Relative: 0.1 % (ref 0.0–3.0)
EOS PCT: 0.1 % (ref 0.0–5.0)
Eosinophils Absolute: 0 10*3/uL (ref 0.0–0.7)
HEMATOCRIT: 29.8 % — AB (ref 36.0–46.0)
Hemoglobin: 9.3 g/dL — ABNORMAL LOW (ref 12.0–15.0)
LYMPHS PCT: 14.2 % (ref 12.0–46.0)
Lymphs Abs: 1.6 10*3/uL (ref 0.7–4.0)
MCHC: 31.1 g/dL (ref 30.0–36.0)
MCV: 76.2 fl — AB (ref 78.0–100.0)
MONOS PCT: 5.5 % (ref 3.0–12.0)
Monocytes Absolute: 0.6 10*3/uL (ref 0.1–1.0)
NEUTROS ABS: 9.2 10*3/uL — AB (ref 1.4–7.7)
Neutrophils Relative %: 80.1 % — ABNORMAL HIGH (ref 43.0–77.0)
PLATELETS: 343 10*3/uL (ref 150.0–400.0)
RBC: 3.91 Mil/uL (ref 3.87–5.11)
RDW: 17.1 % — ABNORMAL HIGH (ref 11.5–15.5)
WBC: 11.5 10*3/uL — ABNORMAL HIGH (ref 4.0–10.5)

## 2015-05-11 LAB — LIPID PANEL
CHOLESTEROL: 183 mg/dL (ref 0–200)
HDL: 63.2 mg/dL (ref >39.00)
LDL CALC: 82 mg/dL (ref 0–99)
NonHDL: 119.44
Total CHOL/HDL Ratio: 3
Triglycerides: 188 mg/dL — ABNORMAL HIGH (ref 0.0–149.0)
VLDL: 37.6 mg/dL (ref 0.0–40.0)

## 2015-05-11 LAB — HEMOGLOBIN A1C: Hgb A1c MFr Bld: 6.2 % (ref 4.6–6.5)

## 2015-05-11 LAB — MICROALBUMIN / CREATININE URINE RATIO
Creatinine,U: 208.4 mg/dL
MICROALB/CREAT RATIO: 0.8 mg/g (ref 0.0–30.0)
Microalb, Ur: 1.7 mg/dL (ref 0.0–1.9)

## 2015-05-11 MED ORDER — FERROUS SULFATE 325 (65 FE) MG PO TABS: 325.0000 mg | ORAL_TABLET | Freq: Every day | ORAL | Status: DC

## 2015-05-11 NOTE — Patient Instructions (Signed)
Limit your sodium (Salt) intake  Continue daily iron Return in 3 months for follow-up Report any clinical change

## 2015-05-11 NOTE — Progress Notes (Signed)
Subjective:    Patient ID: [name], female    DOB: [birth year], 81 y.o.   MRN: [id number]  HPI 81 year old patient who has a history of diet-controlled diabetes.  She has hypertension and a history of anemia.  She discontinued supplemental iron.  She generally feels well.  No further epigastric pain. She generally feels well.  Accompanied by her son today.  She has dyslipidemia and remains on statin therapy.  Past Medical History  Diagnosis Date  . ANEMIA, DEFICIENCY, HX OF 03/01/2010  . DEPRESSIVE DISORDER 09/19/2010  . DM w/o Complication Type II 05/18/2007  . GERD 05/18/2007  . HYPERLIPIDEMIA 03/17/2008  . HYPERTENSION 05/18/2007  . INSOMNIA 11/23/2008  . Loss of weight 05/30/2010  . Kidney stone   . Glaucoma   . Anxiety     Social History   Social History  . Marital Status: Widowed    Spouse Name: N/A  . Number of Children: 3  . Years of Education: N/A   Occupational History  . retired    Social History Main Topics  . Smoking status: Never Smoker   . Smokeless tobacco: Never Used  . Alcohol Use: No  . Drug Use: No  . Sexual Activity: Not on file   Other Topics Concern  . Not on file   Social History Narrative    Past Surgical History  Procedure Laterality Date  . Abdominal hysterectomy    . Cataract extraction    . Hemorrhoid surgery    . Appendectomy    . Pacemaker insertion  09/2011  . Dilation and curettage of uterus    . Colonoscopy  06-28-10    per Dr. Arlyce Dice, diverticulosis only   . Esophagogastroduodenoscopy  05-13-11    per Dr. Arlyce Dice, normal   . Permanent pacemaker insertion N/A 09/30/2011    Procedure: PERMANENT PACEMAKER INSERTION;  Surgeon: Marinus Maw, MD;  Location: [facility] CATH LAB;  Service: Cardiovascular;  Laterality: N/A;    Family History  Problem Relation Age of Onset  . Brain cancer Father   . Colon cancer Sister   . Colitis Brother   . Heart attack Brother     Allergies  Allergen Reactions  . Amoxicillin Rash  .  Penicillins Rash    Current Outpatient Prescriptions on File Prior to Visit  Medication Sig Dispense Refill  . acetaminophen (TYLENOL) 500 MG tablet Take 500-1,000 mg by mouth every 6 (six) hours as needed for moderate pain (pain). For pain    . famotidine (PEPCID) 20 MG tablet TAKE 1 TABLET BY MOUTH TWICE A DAY 180 tablet 1  . furosemide (LASIX) 20 MG tablet TAKE 1 TABLET BY MOUTH EVERY DAY 90 tablet 1  . lisinopril (PRINIVIL,ZESTRIL) 5 MG tablet TAKE 2 TABLETS BY MOUTH EVERY DAY 60 tablet 5  . LORazepam (ATIVAN) 0.5 MG tablet TAKE 1 TABLET BY MOUTH 3 TIMES A DAY AS NEEDED 90 tablet 2  . metFORMIN (GLUCOPHAGE-XR) 500 MG 24 hr tablet TAKE 1 TABLET BY MOUTH EVERY DAY 90 tablet 2  . sertraline (ZOLOFT) 50 MG tablet TAKE 1 TABLET BY MOUTH EVERY DAY 90 tablet 2  . simvastatin (ZOCOR) 80 MG tablet Take 0.5 tablets (40 mg total) by mouth at bedtime. 90 tablet 1  . traMADol (ULTRAM) 50 MG tablet TAKE 1 TABLET BY MOUTH EVERY 8 HOURS AS NEEDED FOR PAIN 90 tablet 2   No current facility-administered medications on file prior to visit.    BP 130/56 mmHg  Pulse 84  Temp(Src)  98.8 F (37.1 C) (Oral)  Ht  (1.6 m)  Wt 144 lb (65.318 kg)  BMI 25.51 kg/m2  SpO2 98%      Review of Systems  Constitutional: Negative.   HENT: Negative for congestion, dental problem, hearing loss, rhinorrhea, sinus pressure, sore throat and tinnitus.   Eyes: Negative for pain, discharge and visual disturbance.  Respiratory: Negative for cough and shortness of breath.   Cardiovascular: Negative for chest pain, palpitations and leg swelling.  Gastrointestinal: Negative for nausea, vomiting, abdominal pain, diarrhea, constipation, blood in stool and abdominal distention.  Genitourinary: Negative for dysuria, urgency, frequency, hematuria, flank pain, vaginal bleeding, vaginal discharge, difficulty urinating, vaginal pain and pelvic pain.  Musculoskeletal: Negative for joint swelling, arthralgias and gait problem.    Skin: Negative for rash.  Neurological: Positive for dizziness and headaches. Negative for syncope, speech difficulty, weakness and numbness.  Hematological: Negative for adenopathy.  Psychiatric/Behavioral: Negative for behavioral problems, dysphoric mood and agitation. The patient is nervous/anxious.        Objective:   Physical Exam  Constitutional: She is oriented to person, place, and time. She appears well-developed and well-nourished.  Blood pressure 110/70  HENT:  Head: Normocephalic.  Right Ear: External ear normal.  Left Ear: External ear normal.  Mouth/Throat: Oropharynx is clear and moist.  Eyes: Conjunctivae and EOM are normal. Pupils are equal, round, and reactive to light.  Neck: Normal range of motion. Neck supple. No thyromegaly present.  Cardiovascular: Normal rate, regular rhythm, normal heart sounds and intact distal pulses.   Pulmonary/Chest: Effort normal and breath sounds normal.  Abdominal: Soft. Bowel sounds are normal. She exhibits no mass. There is no tenderness.  Musculoskeletal: Normal range of motion.  Lymphadenopathy:    She has no cervical adenopathy.  Neurological: She is alert and oriented to person, place, and time.  Skin: Skin is warm and dry. No rash noted.  Psychiatric: She has a normal mood and affect. Her behavior is normal.          Assessment & Plan:

## 2015-05-15 ENCOUNTER — Ambulatory Visit (INDEPENDENT_AMBULATORY_CARE_PROVIDER_SITE_OTHER): Payer: Medicare Other | Admitting: *Deleted

## 2015-05-15 ENCOUNTER — Encounter: Payer: Self-pay | Admitting: Internal Medicine

## 2015-05-15 ENCOUNTER — Telehealth: Payer: Self-pay | Admitting: Internal Medicine

## 2015-05-15 DIAGNOSIS — I480 Paroxysmal atrial fibrillation: Secondary | ICD-10-CM | POA: Diagnosis not present

## 2015-05-15 NOTE — Telephone Encounter (Signed)
Pt has been sch

## 2015-05-15 NOTE — Progress Notes (Signed)
Remote pacemaker transmission.   

## 2015-05-15 NOTE — Progress Notes (Addendum)
   Subjective:    Patient ID: [name], female    DOB: 1922/12/25, 81 y.o.   MRN: [id number]  HPI    Review of Systems     Objective:   Physical Exam        Assessment & Plan:   Diet-controlled diabetes.  Will check a hemoglobin A1c Essential hypertension, stable Dyslipidemia.  Continue statin therapy Anxiety disorder Anemia.  Will check CBC.  Chronic iron therapy.  Discussed  Recheck 3 months

## 2015-05-15 NOTE — Telephone Encounter (Signed)
Pt son would like to know should his mom have blood work cbc only in one month or see md also.

## 2015-05-15 NOTE — Telephone Encounter (Signed)
Pt needs follow up with Dr.K in one month.

## 2015-05-22 LAB — CUP PACEART REMOTE DEVICE CHECK
Brady Statistic RA Percent Paced: 38 %
Brady Statistic RV Percent Paced: 0 %
Date Time Interrogation Session: 20160912041100
Lead Channel Impedance Value: 605 Ohm
Lead Channel Impedance Value: 928 Ohm
Lead Channel Setting Pacing Amplitude: 2 V
MDC IDC MSMT BATTERY REMAINING LONGEVITY: 90 mo
MDC IDC MSMT BATTERY REMAINING PERCENTAGE: 100 %
MDC IDC MSMT LEADCHNL RA SENSING INTR AMPL: 1.1 mV
MDC IDC MSMT LEADCHNL RV SENSING INTR AMPL: 8.7 mV
MDC IDC PG SERIAL: 123684
MDC IDC SET LEADCHNL RV PACING AMPLITUDE: 2.4 V
MDC IDC SET LEADCHNL RV PACING PULSEWIDTH: 0.4 ms
MDC IDC SET LEADCHNL RV SENSING SENSITIVITY: 2.5 mV
Zone Setting Detection Interval: 375 ms

## 2015-06-01 ENCOUNTER — Other Ambulatory Visit: Payer: Self-pay | Admitting: Internal Medicine

## 2015-06-02 ENCOUNTER — Encounter: Payer: Self-pay | Admitting: Cardiology

## 2015-06-13 ENCOUNTER — Ambulatory Visit (INDEPENDENT_AMBULATORY_CARE_PROVIDER_SITE_OTHER): Payer: Medicare Other | Admitting: Internal Medicine

## 2015-06-13 ENCOUNTER — Encounter: Payer: Self-pay | Admitting: Internal Medicine

## 2015-06-13 VITALS — BP 130/60 | HR 68 | Temp 98.9°F | Resp 18 | Ht 63.0 in | Wt 147.0 lb

## 2015-06-13 DIAGNOSIS — F419 Anxiety disorder, unspecified: Secondary | ICD-10-CM

## 2015-06-13 DIAGNOSIS — Z862 Personal history of diseases of the blood and blood-forming organs and certain disorders involving the immune mechanism: Secondary | ICD-10-CM

## 2015-06-13 DIAGNOSIS — R079 Chest pain, unspecified: Secondary | ICD-10-CM | POA: Diagnosis not present

## 2015-06-13 DIAGNOSIS — I10 Essential (primary) hypertension: Secondary | ICD-10-CM

## 2015-06-13 DIAGNOSIS — E119 Type 2 diabetes mellitus without complications: Secondary | ICD-10-CM

## 2015-06-13 LAB — CBC WITH DIFFERENTIAL/PLATELET
BASOS PCT: 0.3 % (ref 0.0–3.0)
Basophils Absolute: 0 10*3/uL (ref 0.0–0.1)
EOS ABS: 0 10*3/uL (ref 0.0–0.7)
EOS PCT: 0.1 % (ref 0.0–5.0)
HCT: 34.8 % — ABNORMAL LOW (ref 36.0–46.0)
Hemoglobin: 10.7 g/dL — ABNORMAL LOW (ref 12.0–15.0)
LYMPHS ABS: 2.2 10*3/uL (ref 0.7–4.0)
Lymphocytes Relative: 18.2 % (ref 12.0–46.0)
MCHC: 30.8 g/dL (ref 30.0–36.0)
MCV: 81.4 fl (ref 78.0–100.0)
MONO ABS: 0.5 10*3/uL (ref 0.1–1.0)
Monocytes Relative: 4.1 % (ref 3.0–12.0)
NEUTROS PCT: 77.3 % — AB (ref 43.0–77.0)
Neutro Abs: 9.6 10*3/uL — ABNORMAL HIGH (ref 1.4–7.7)
Platelets: 294 10*3/uL (ref 150.0–400.0)
RBC: 4.27 Mil/uL (ref 3.87–5.11)
RDW: 20.7 % — AB (ref 11.5–15.5)
WBC: 12.4 10*3/uL — AB (ref 4.0–10.5)

## 2015-06-13 LAB — FERRITIN: Ferritin: 11.5 ng/mL (ref 10.0–291.0)

## 2015-06-13 NOTE — Patient Instructions (Signed)
Continue daily iron therapy  Return in 3 months for follow-up

## 2015-06-13 NOTE — Progress Notes (Signed)
Subjective:    Patient ID: [name], female    DOB: 01/23/1923, 81 y.o.   MRN: [id number]  HPI  81 year old patient who is seen today for follow-up.  She is seen for general follow-up.  1 month ago and noted have worsening anemia.  Remains on daily iron supplementation.  She feels well.  No concerns or complaints.  Her last colonoscopy and upper endoscopy was in 2011.  No change in bowel habits.  No melena.  Past Medical History  Diagnosis Date  . ANEMIA, DEFICIENCY, HX OF 03/01/2010  . DEPRESSIVE DISORDER 09/19/2010  . DM w/o Complication Type II 05/18/2007  . GERD 05/18/2007  . HYPERLIPIDEMIA 03/17/2008  . HYPERTENSION 05/18/2007  . INSOMNIA 11/23/2008  . Loss of weight 05/30/2010  . Kidney stone   . Glaucoma   . Anxiety     Social History   Social History  . Marital Status: Widowed    Spouse Name: N/A  . Number of Children: 3  . Years of Education: N/A   Occupational History  . retired    Social History Main Topics  . Smoking status: Never Smoker   . Smokeless tobacco: Never Used  . Alcohol Use: No  . Drug Use: No  . Sexual Activity: Not on file   Other Topics Concern  . Not on file   Social History Narrative    Past Surgical History  Procedure Laterality Date  . Abdominal hysterectomy    . Cataract extraction    . Hemorrhoid surgery    . Appendectomy    . Pacemaker insertion  09/2011  . Dilation and curettage of uterus    . Colonoscopy  06-28-10    per Dr. Arlyce Dice, diverticulosis only   . Esophagogastroduodenoscopy  05-13-11    per Dr. Arlyce Dice, normal   . Permanent pacemaker insertion N/A 09/30/2011    Procedure: PERMANENT PACEMAKER INSERTION;  Surgeon: Marinus Maw, MD;  Location: [facility] CATH LAB;  Service: Cardiovascular;  Laterality: N/A;    Family History  Problem Relation Age of Onset  . Brain cancer Father   . Colon cancer Sister   . Colitis Brother   . Heart attack Brother     Allergies  Allergen Reactions  . Amoxicillin Rash  .  Penicillins Rash    Current Outpatient Prescriptions on File Prior to Visit  Medication Sig Dispense Refill  . acetaminophen (TYLENOL) 500 MG tablet Take 500-1,000 mg by mouth every 6 (six) hours as needed for moderate pain (pain). For pain    . famotidine (PEPCID) 20 MG tablet TAKE 1 TABLET BY MOUTH TWICE A DAY 180 tablet 1  . ferrous sulfate 325 (65 FE) MG tablet Take 1 tablet (325 mg total) by mouth daily with breakfast.  3  . furosemide (LASIX) 20 MG tablet TAKE 1 TABLET BY MOUTH EVERY DAY 90 tablet 1  . lisinopril (PRINIVIL,ZESTRIL) 5 MG tablet TAKE 2 TABLETS BY MOUTH EVERY DAY 60 tablet 5  . LORazepam (ATIVAN) 0.5 MG tablet TAKE 1 TABLET BY MOUTH 3 TIMES A DAY AS NEEDED 90 tablet 2  . sertraline (ZOLOFT) 50 MG tablet TAKE 1 TABLET BY MOUTH EVERY DAY 90 tablet 2  . simvastatin (ZOCOR) 80 MG tablet Take 0.5 tablets (40 mg total) by mouth at bedtime. 90 tablet 1  . traMADol (ULTRAM) 50 MG tablet TAKE 1 TABLET BY MOUTH EVERY 8 HOURS AS NEEDED FOR PAIN 90 tablet 2   No current facility-administered medications on file prior to visit.  BP 130/60 mmHg  Pulse 68  Temp(Src) 98.9 F (37.2 C) (Oral)  Resp 18  Ht  (1.6 m)  Wt 147 lb (66.679 kg)  BMI 26.05 kg/m2     Review of Systems  Constitutional: Negative.   HENT: Negative for congestion, dental problem, hearing loss, rhinorrhea, sinus pressure, sore throat and tinnitus.   Eyes: Negative for pain, discharge and visual disturbance.  Respiratory: Negative for cough and shortness of breath.   Cardiovascular: Negative for chest pain, palpitations and leg swelling.  Gastrointestinal: Negative for nausea, vomiting, abdominal pain, diarrhea, constipation, blood in stool and abdominal distention.  Genitourinary: Negative for dysuria, urgency, frequency, hematuria, flank pain, vaginal bleeding, vaginal discharge, difficulty urinating, vaginal pain and pelvic pain.  Musculoskeletal: Negative for joint swelling, arthralgias and gait  problem.  Skin: Negative for rash.  Neurological: Negative for dizziness, syncope, speech difficulty, weakness, numbness and headaches.  Hematological: Negative for adenopathy.  Psychiatric/Behavioral: Negative for behavioral problems, dysphoric mood and agitation. The patient is not nervous/anxious.        Objective:   Physical Exam  Constitutional: She is oriented to person, place, and time. She appears well-developed and well-nourished.  HENT:  Head: Normocephalic.  Right Ear: External ear normal.  Left Ear: External ear normal.  Mouth/Throat: Oropharynx is clear and moist.  Eyes: Conjunctivae and EOM are normal. Pupils are equal, round, and reactive to light.  Neck: Normal range of motion. Neck supple. No thyromegaly present.  Cardiovascular: Normal rate, regular rhythm, normal heart sounds and intact distal pulses.   Pulmonary/Chest: Effort normal and breath sounds normal.  Abdominal: Soft. Bowel sounds are normal. She exhibits no distension and no mass. There is no tenderness. There is no rebound and no guarding.  Musculoskeletal: Normal range of motion.  Lymphadenopathy:    She has no cervical adenopathy.  Neurological: She is alert and oriented to person, place, and time.  Skin: Skin is warm and dry. No rash noted.  Psychiatric: She has a normal mood and affect. Her behavior is normal.          Assessment & Plan:  History by deficiency anemia.  Will check a CBC and ferritin level.  We'll continue on iron supplementation Essential hypertension, stable Diet-controlled diabetes   Lab Results  Component Value Date   HGBA1C 6.2 05/11/2015    Recheck 3 months

## 2015-06-13 NOTE — Progress Notes (Signed)
Pre visit review using our clinic review tool, if applicable. No additional management support is needed unless otherwise documented below in the visit note. 

## 2015-06-26 ENCOUNTER — Other Ambulatory Visit: Payer: Self-pay | Admitting: Internal Medicine

## 2015-06-27 ENCOUNTER — Other Ambulatory Visit: Payer: Self-pay | Admitting: Internal Medicine

## 2015-07-01 ENCOUNTER — Other Ambulatory Visit: Payer: Self-pay | Admitting: Internal Medicine

## 2015-07-26 ENCOUNTER — Other Ambulatory Visit: Payer: Self-pay | Admitting: Internal Medicine

## 2015-08-10 ENCOUNTER — Ambulatory Visit: Payer: Medicare Other | Admitting: Internal Medicine

## 2015-08-10 ENCOUNTER — Telehealth: Payer: Self-pay | Admitting: Internal Medicine

## 2015-08-10 NOTE — Telephone Encounter (Signed)
Spoke with son and he is concerned about getting his mom out tomorrow in the cold weather but after much debate he will try and get her here.  I have offered several times to reschedule but he will do his best to get her here

## 2015-08-10 NOTE — Telephone Encounter (Signed)
New Message  Pt requested to speak w/RN concerning appt on 12/9. Can leave detailed message. Please call back and discuss.

## 2015-08-11 ENCOUNTER — Encounter: Payer: Self-pay | Admitting: Internal Medicine

## 2015-08-11 ENCOUNTER — Ambulatory Visit (INDEPENDENT_AMBULATORY_CARE_PROVIDER_SITE_OTHER): Payer: Medicare Other | Admitting: Internal Medicine

## 2015-08-11 VITALS — BP 142/60 | HR 52 | Ht 63.0 in | Wt 147.4 lb

## 2015-08-11 DIAGNOSIS — R001 Bradycardia, unspecified: Secondary | ICD-10-CM

## 2015-08-11 DIAGNOSIS — Z950 Presence of cardiac pacemaker: Secondary | ICD-10-CM | POA: Diagnosis not present

## 2015-08-11 DIAGNOSIS — I480 Paroxysmal atrial fibrillation: Secondary | ICD-10-CM | POA: Diagnosis not present

## 2015-08-11 LAB — CUP PACEART INCLINIC DEVICE CHECK
Date Time Interrogation Session: 20161209050000
Implantable Lead Implant Date: 20130128
Implantable Lead Location: 753859
Implantable Lead Location: 753860
Implantable Lead Model: 4135
Implantable Lead Serial Number: 29118103
Lead Channel Impedance Value: 622 Ohm
Lead Channel Impedance Value: 945 Ohm
Lead Channel Pacing Threshold Amplitude: 0.4 V
Lead Channel Pacing Threshold Pulse Width: 0.4 ms
Lead Channel Pacing Threshold Pulse Width: 0.4 ms
Lead Channel Setting Pacing Amplitude: 2 V
MDC IDC LEAD IMPLANT DT: 20130128
MDC IDC LEAD MODEL: 4136
MDC IDC LEAD SERIAL: 29117362
MDC IDC MSMT LEADCHNL RA PACING THRESHOLD AMPLITUDE: 0.6 V
MDC IDC MSMT LEADCHNL RA SENSING INTR AMPL: 4.9 mV
MDC IDC MSMT LEADCHNL RV SENSING INTR AMPL: 11.9 mV
MDC IDC PG SERIAL: 123684
MDC IDC SET LEADCHNL RV PACING AMPLITUDE: 2.4 V
MDC IDC SET LEADCHNL RV PACING PULSEWIDTH: 0.4 ms
MDC IDC SET LEADCHNL RV SENSING SENSITIVITY: 2.5 mV
MDC IDC STAT BRADY RA PERCENT PACED: 40 %
MDC IDC STAT BRADY RV PERCENT PACED: 1 %

## 2015-08-11 NOTE — Assessment & Plan Note (Signed)
Her [employer] PPM is working normally. Will recheck in several months.  

## 2015-08-11 NOTE — Assessment & Plan Note (Signed)
She has had atrial flutter but no fibrillation. She is not a candidate currently for systemic anti-coagualtion due to weakness and a propensity to fall.

## 2015-08-11 NOTE — Progress Notes (Signed)
HPI [name] returns today for followup. She is a very pleasant elderly 81 yo woman with hypertension, symptomatic bradycardia, status post permanent pacemaker insertion, and is very hard of hearing. In the interim, she has been stable. She denies shortness of breath. Minimal peripheral edema. No syncope. She specifically denies any pain related to exertion ,activity, or movement. She admits to dietary indiscretion with sodium. She does not have palpitations. She notes that when she stands up, she gets a little dizzy.  Allergies  Allergen Reactions  . Amoxicillin Rash  . Penicillins Rash     Current Outpatient Prescriptions  Medication Sig Dispense Refill  . acetaminophen (TYLENOL) 500 MG tablet Take 500-1,000 mg by mouth every 6 (six) hours as needed for moderate pain (pain). For pain    . famotidine (PEPCID) 20 MG tablet TAKE 1 TABLET BY MOUTH TWICE A DAY 180 tablet 1  . ferrous sulfate 325 (65 FE) MG tablet Take 1 tablet (325 mg total) by mouth daily with breakfast.  3  . furosemide (LASIX) 20 MG tablet TAKE 1 TABLET BY MOUTH EVERY DAY 90 tablet 1  . lisinopril (PRINIVIL,ZESTRIL) 5 MG tablet TAKE 2 TABLETS BY MOUTH EVERY DAY 60 tablet 5  . LORazepam (ATIVAN) 0.5 MG tablet TAKE 1 TABLET BY MOUTH 3 TIMES A DAY AS NEEDED 90 tablet 2  . sertraline (ZOLOFT) 50 MG tablet TAKE 1 TABLET BY MOUTH EVERY DAY 90 tablet 2  . simvastatin (ZOCOR) 80 MG tablet Take 0.5 tablets (40 mg total) by mouth at bedtime. 90 tablet 1  . traMADol (ULTRAM) 50 MG tablet TAKE 1 TABLET BY MOUTH EVERY 8 HOURS AS NEEDED FOR PAIN 90 tablet 2   No current facility-administered medications for this visit.     Past Medical History  Diagnosis Date  . ANEMIA, DEFICIENCY, HX OF 03/01/2010  . DEPRESSIVE DISORDER 09/19/2010  . DM w/o Complication Type II 05/18/2007  . GERD 05/18/2007  . HYPERLIPIDEMIA 03/17/2008  . HYPERTENSION 05/18/2007  . INSOMNIA 11/23/2008  . Loss of weight 05/30/2010  . Kidney stone   . Glaucoma   .  Anxiety     ROS:   All systems reviewed and negative except as noted in the HPI.   Past Surgical History  Procedure Laterality Date  . Abdominal hysterectomy    . Cataract extraction    . Hemorrhoid surgery    . Appendectomy    . Pacemaker insertion  09/2011  . Dilation and curettage of uterus    . Colonoscopy  06-28-10    per Dr. Arlyce DiceKaplan, diverticulosis only   . Esophagogastroduodenoscopy  05-13-11    per Dr. Arlyce DiceKaplan, normal   . Permanent pacemaker insertion N/A 09/30/2011    Procedure: PERMANENT PACEMAKER INSERTION;  Surgeon: Marinus MawGregg W Sasan Wilkie, MD;  [facility];  Service: Cardiovascular;  Laterality: N/A;     Family History  Problem Relation Age of Onset  . Brain cancer Father   . Colon cancer Sister   . Colitis Brother   . Heart attack Brother      Social History   Social History  . Marital Status: Widowed    Spouse Name: N/A  . Number of Children: 3  . Years of Education: N/A   Occupational History  . retired    Social History Main Topics  . Smoking status: Never Smoker   . Smokeless tobacco: Never Used  . Alcohol Use: No  . Drug Use: No  . Sexual Activity: Not on file   Other  Topics Concern  . Not on file   Social History Narrative     BP 142/60 mmHg  Pulse 52  Ht  (1.6 m)  Wt 147 lb 6.4 oz (66.86 kg)  BMI 26.12 kg/m2  Physical Exam:  Well appearing elderly woman, NAD HEENT: Unremarkable Neck:  7 cm JVD, no thyromegally Lungs:  Clear with no wheezes, rales, or rhonchi. Well-healed pacemaker incision HEART:  Regular rate rhythm, no murmurs, no rubs, no clicks Abd:  soft, positive bowel sounds, no organomegally, no rebound, no guarding Ext:  2 plus pulses, no edema, no cyanosis, no clubbing Skin:  No rashes no nodules Neuro:  CN II through XII intact, motor grossly intact  DEVICE  Normal device function.  See PaceArt for details.   Assess/Plan:

## 2015-08-15 ENCOUNTER — Ambulatory Visit (INDEPENDENT_AMBULATORY_CARE_PROVIDER_SITE_OTHER): Payer: Medicare Other | Admitting: Internal Medicine

## 2015-08-15 ENCOUNTER — Encounter: Payer: Self-pay | Admitting: Internal Medicine

## 2015-08-15 VITALS — BP 128/59 | HR 65 | Temp 98.5°F | Ht 63.0 in | Wt 149.0 lb

## 2015-08-15 DIAGNOSIS — E119 Type 2 diabetes mellitus without complications: Secondary | ICD-10-CM | POA: Diagnosis not present

## 2015-08-15 DIAGNOSIS — I10 Essential (primary) hypertension: Secondary | ICD-10-CM | POA: Diagnosis not present

## 2015-08-15 DIAGNOSIS — F419 Anxiety disorder, unspecified: Secondary | ICD-10-CM

## 2015-08-15 DIAGNOSIS — R52 Pain, unspecified: Secondary | ICD-10-CM | POA: Diagnosis not present

## 2015-08-15 NOTE — Patient Instructions (Signed)
Please check your hemoglobin A1c every 3 months  Limit your sodium (Salt) intake  Please check your blood pressure on a regular basis.  If it is consistently greater than 150/90, please make an office appointment.  

## 2015-08-15 NOTE — Progress Notes (Signed)
Pre visit review using our clinic review tool, if applicable. No additional management support is needed unless otherwise documented below in the visit note. 

## 2015-08-15 NOTE — Progress Notes (Signed)
Subjective:    Patient ID: [name], female    DOB: Jan 01, 1923, 81 y.o.   MRN: [id number]  HPI  81 year old patient who is seen today for follow-up.  She has a history of diet-controlled diabetes.  She has dyslipidemia and hypertension.  Doing quite well.  She has a history of panic/anxiety disorder as well as mild to moderate dementia.  She is accompanied by her son.  No new concerns or complaints.  Has had a recent cardiology evaluation.  Past Medical History  Diagnosis Date  . ANEMIA, DEFICIENCY, HX OF 03/01/2010  . DEPRESSIVE DISORDER 09/19/2010  . DM w/o Complication Type II 05/18/2007  . GERD 05/18/2007  . HYPERLIPIDEMIA 03/17/2008  . HYPERTENSION 05/18/2007  . INSOMNIA 11/23/2008  . Loss of weight 05/30/2010  . Kidney stone   . Glaucoma   . Anxiety     Social History   Social History  . Marital Status: Widowed    Spouse Name: N/A  . Number of Children: 3  . Years of Education: N/A   Occupational History  . retired    Social History Main Topics  . Smoking status: Never Smoker   . Smokeless tobacco: Never Used  . Alcohol Use: No  . Drug Use: No  . Sexual Activity: Not on file   Other Topics Concern  . Not on file   Social History Narrative    Past Surgical History  Procedure Laterality Date  . Abdominal hysterectomy    . Cataract extraction    . Hemorrhoid surgery    . Appendectomy    . Pacemaker insertion  09/2011  . Dilation and curettage of uterus    . Colonoscopy  06-28-10    per Dr. Arlyce Dice, diverticulosis only   . Esophagogastroduodenoscopy  05-13-11    per Dr. Arlyce Dice, normal   . Permanent pacemaker insertion N/A 09/30/2011    Procedure: PERMANENT PACEMAKER INSERTION;  Surgeon: Marinus Maw, MD;  Location: [facility] CATH LAB;  Service: Cardiovascular;  Laterality: N/A;    Family History  Problem Relation Age of Onset  . Brain cancer Father   . Colon cancer Sister   . Colitis Brother   . Heart attack Brother     Allergies  Allergen Reactions    . Amoxicillin Rash  . Penicillins Rash    Current Outpatient Prescriptions on File Prior to Visit  Medication Sig Dispense Refill  . acetaminophen (TYLENOL) 500 MG tablet Take 500-1,000 mg by mouth every 6 (six) hours as needed for moderate pain (pain). For pain    . famotidine (PEPCID) 20 MG tablet TAKE 1 TABLET BY MOUTH TWICE A DAY 180 tablet 1  . ferrous sulfate 325 (65 FE) MG tablet Take 1 tablet (325 mg total) by mouth daily with breakfast.  3  . furosemide (LASIX) 20 MG tablet TAKE 1 TABLET BY MOUTH EVERY DAY 90 tablet 1  . lisinopril (PRINIVIL,ZESTRIL) 5 MG tablet TAKE 2 TABLETS BY MOUTH EVERY DAY 60 tablet 5  . LORazepam (ATIVAN) 0.5 MG tablet TAKE 1 TABLET BY MOUTH 3 TIMES A DAY AS NEEDED 90 tablet 2  . sertraline (ZOLOFT) 50 MG tablet TAKE 1 TABLET BY MOUTH EVERY DAY 90 tablet 2  . simvastatin (ZOCOR) 80 MG tablet Take 0.5 tablets (40 mg total) by mouth at bedtime. 90 tablet 1  . traMADol (ULTRAM) 50 MG tablet TAKE 1 TABLET BY MOUTH EVERY 8 HOURS AS NEEDED FOR PAIN 90 tablet 2   No current facility-administered medications on file  prior to visit.    BP 128/59 mmHg  Pulse 65  Temp(Src) 98.5 F (36.9 C) (Oral)  Ht 5\' 3"  (1.6 m)  Wt 149 lb (67.586 kg)  BMI 26.40 kg/m2  SpO2 99%      Review of Systems  Constitutional: Negative.   HENT: Negative for congestion, dental problem, hearing loss, rhinorrhea, sinus pressure, sore throat and tinnitus.   Eyes: Negative for pain, discharge and visual disturbance.  Respiratory: Negative for cough and shortness of breath.   Cardiovascular: Negative for chest pain, palpitations and leg swelling.  Gastrointestinal: Negative for nausea, vomiting, abdominal pain, diarrhea, constipation, blood in stool and abdominal distention.  Genitourinary: Negative for dysuria, urgency, frequency, hematuria, flank pain, vaginal bleeding, vaginal discharge, difficulty urinating, vaginal pain and pelvic pain.  Musculoskeletal: Negative for joint  swelling, arthralgias and gait problem.  Skin: Negative for rash.  Neurological: Negative for dizziness, syncope, speech difficulty, weakness, numbness and headaches.  Hematological: Negative for adenopathy.  Psychiatric/Behavioral: Positive for confusion and decreased concentration. Negative for behavioral problems, dysphoric mood and agitation. The patient is nervous/anxious.        Objective:   Physical Exam  Constitutional: She is oriented to person, place, and time. She appears well-developed and well-nourished.  HENT:  Head: Normocephalic.  Right Ear: External ear normal.  Left Ear: External ear normal.  Mouth/Throat: Oropharynx is clear and moist.  Eyes: Conjunctivae and EOM are normal. Pupils are equal, round, and reactive to light.  Neck: Normal range of motion. Neck supple. No thyromegaly present.  Cardiovascular: Normal rate, regular rhythm, normal heart sounds and intact distal pulses.   Pulmonary/Chest: Effort normal and breath sounds normal.  Abdominal: Soft. Bowel sounds are normal. She exhibits no mass. There is no tenderness.  Musculoskeletal: Normal range of motion.  Lymphadenopathy:    She has no cervical adenopathy.  Neurological: She is alert and oriented to person, place, and time.  Skin: Skin is warm and dry. No rash noted.  Psychiatric: She has a normal mood and affect. Her behavior is normal.          Assessment & Plan:   Anxiety disorder Dementia Hypertension, well-controlled.  No change in therapy Dyslipidemia.  Continue statin therapy  Recheck 4 months

## 2015-09-11 ENCOUNTER — Ambulatory Visit: Payer: Medicare Other | Admitting: Internal Medicine

## 2015-09-21 ENCOUNTER — Other Ambulatory Visit: Payer: Self-pay | Admitting: Internal Medicine

## 2015-09-25 LAB — HM DIABETES EYE EXAM

## 2015-09-27 ENCOUNTER — Encounter: Payer: Self-pay | Admitting: Internal Medicine

## 2015-10-06 ENCOUNTER — Other Ambulatory Visit: Payer: Self-pay | Admitting: Internal Medicine

## 2015-10-26 ENCOUNTER — Other Ambulatory Visit: Payer: Self-pay | Admitting: Internal Medicine

## 2015-10-26 MED ORDER — SIMVASTATIN 40 MG PO TABS: 40.0000 mg | ORAL_TABLET | Freq: Every day | ORAL | Status: DC

## 2015-10-27 ENCOUNTER — Telehealth: Payer: Self-pay | Admitting: Internal Medicine

## 2015-10-27 MED ORDER — OSELTAMIVIR PHOSPHATE 75 MG PO CAPS: 75.0000 mg | ORAL_CAPSULE | Freq: Every day | ORAL | Status: DC

## 2015-10-27 NOTE — Telephone Encounter (Signed)
Pt's son [name], diagnosed with Flu yesterday and wants to know if pt needs Prophylactic so she does not get sick. Please advise.

## 2015-10-27 NOTE — Telephone Encounter (Signed)
Tamiflu 75 mg #10 one tablet daily for 10 days

## 2015-10-27 NOTE — Telephone Encounter (Signed)
Spoke to pt's son [name], asked him if he had a positive Flu swab? [name] said yes, went to doctor yesterday. Told him okay I will check with Dr.K if he will give her something prophylactic and get back to you. [name] verbalized understanding.

## 2015-10-27 NOTE — Addendum Note (Signed)
Addended by: Jimmye Norman on: 10/27/2015 12:55 PM   Modules accepted: Orders

## 2015-10-27 NOTE — Telephone Encounter (Signed)
Spoke to [employer], told him Rx for Tamiflu sent to pharmacy. [name] verbalized understanding.

## 2015-10-27 NOTE — Telephone Encounter (Signed)
Son call to say that he has the flu and is asking if his Mom need to take something as a preventive . Would like a call back

## 2015-11-13 ENCOUNTER — Ambulatory Visit (INDEPENDENT_AMBULATORY_CARE_PROVIDER_SITE_OTHER): Payer: Medicare Other | Admitting: *Deleted

## 2015-11-13 DIAGNOSIS — R001 Bradycardia, unspecified: Secondary | ICD-10-CM

## 2015-11-13 NOTE — Progress Notes (Signed)
Remote pacemaker transmission.   

## 2015-11-20 ENCOUNTER — Emergency Department (HOSPITAL_COMMUNITY)
Admission: EM | Admit: 2015-11-20 | Discharge: 2015-11-20 | Disposition: A | Discharge status: Home / Self Care | Payer: Medicare Other | Attending: Emergency Medicine | Admitting: Emergency Medicine

## 2015-11-20 ENCOUNTER — Emergency Department (HOSPITAL_COMMUNITY): Payer: Medicare Other

## 2015-11-20 ENCOUNTER — Telehealth: Payer: Self-pay | Admitting: Internal Medicine

## 2015-11-20 ENCOUNTER — Encounter (HOSPITAL_COMMUNITY): Payer: Self-pay | Admitting: Emergency Medicine

## 2015-11-20 DIAGNOSIS — R109 Unspecified abdominal pain: Secondary | ICD-10-CM | POA: Insufficient documentation

## 2015-11-20 DIAGNOSIS — D649 Anemia, unspecified: Secondary | ICD-10-CM | POA: Diagnosis not present

## 2015-11-20 DIAGNOSIS — E785 Hyperlipidemia, unspecified: Secondary | ICD-10-CM | POA: Diagnosis not present

## 2015-11-20 DIAGNOSIS — R079 Chest pain, unspecified: Secondary | ICD-10-CM | POA: Diagnosis not present

## 2015-11-20 DIAGNOSIS — E119 Type 2 diabetes mellitus without complications: Secondary | ICD-10-CM | POA: Insufficient documentation

## 2015-11-20 DIAGNOSIS — Z880 Allergy status to penicillin: Secondary | ICD-10-CM | POA: Insufficient documentation

## 2015-11-20 DIAGNOSIS — Z87442 Personal history of urinary calculi: Secondary | ICD-10-CM | POA: Insufficient documentation

## 2015-11-20 DIAGNOSIS — G4700 Insomnia, unspecified: Secondary | ICD-10-CM | POA: Insufficient documentation

## 2015-11-20 DIAGNOSIS — F419 Anxiety disorder, unspecified: Secondary | ICD-10-CM | POA: Diagnosis not present

## 2015-11-20 DIAGNOSIS — I10 Essential (primary) hypertension: Secondary | ICD-10-CM | POA: Insufficient documentation

## 2015-11-20 DIAGNOSIS — K219 Gastro-esophageal reflux disease without esophagitis: Secondary | ICD-10-CM | POA: Insufficient documentation

## 2015-11-20 DIAGNOSIS — R197 Diarrhea, unspecified: Secondary | ICD-10-CM | POA: Diagnosis not present

## 2015-11-20 LAB — CBC
HCT: 39.3 % (ref 36.0–46.0)
Hemoglobin: 12.4 g/dL (ref 12.0–15.0)
MCH: 30.5 pg (ref 26.0–34.0)
MCHC: 31.6 g/dL (ref 30.0–36.0)
MCV: 96.6 fL (ref 78.0–100.0)
PLATELETS: 255 10*3/uL (ref 150–400)
RBC: 4.07 MIL/uL (ref 3.87–5.11)
RDW: 13.3 % (ref 11.5–15.5)
WBC: 10.3 10*3/uL (ref 4.0–10.5)

## 2015-11-20 LAB — URINE MICROSCOPIC-ADD ON

## 2015-11-20 LAB — URINALYSIS, ROUTINE W REFLEX MICROSCOPIC
Bilirubin Urine: NEGATIVE
GLUCOSE, UA: NEGATIVE mg/dL
Hgb urine dipstick: NEGATIVE
KETONES UR: NEGATIVE mg/dL
NITRITE: NEGATIVE
PROTEIN: NEGATIVE mg/dL
Specific Gravity, Urine: 1.014 (ref 1.005–1.030)
pH: 6 (ref 5.0–8.0)

## 2015-11-20 LAB — COMPREHENSIVE METABOLIC PANEL
ALT: 13 U/L — ABNORMAL LOW (ref 14–54)
AST: 15 U/L (ref 15–41)
Albumin: 4.2 g/dL (ref 3.5–5.0)
Alkaline Phosphatase: 40 U/L (ref 38–126)
Anion gap: 12 (ref 5–15)
BILIRUBIN TOTAL: 0.5 mg/dL (ref 0.3–1.2)
BUN: 21 mg/dL — AB (ref 6–20)
CALCIUM: 9.4 mg/dL (ref 8.9–10.3)
CO2: 24 mmol/L (ref 22–32)
CREATININE: 0.89 mg/dL (ref 0.44–1.00)
Chloride: 102 mmol/L (ref 101–111)
GFR calc Af Amer: >60 mL/min (ref >60)
GFR, EST NON AFRICAN AMERICAN: 55 mL/min — AB (ref >60)
Glucose, Bld: 101 mg/dL — ABNORMAL HIGH (ref 65–99)
Potassium: 4 mmol/L (ref 3.5–5.1)
Sodium: 138 mmol/L (ref 135–145)
TOTAL PROTEIN: 7.8 g/dL (ref 6.5–8.1)

## 2015-11-20 LAB — LIPASE, BLOOD: LIPASE: 29 U/L (ref 11–51)

## 2015-11-20 LAB — I-STAT TROPONIN, ED: Troponin i, poc: 0 ng/mL (ref 0.00–0.08)

## 2015-11-20 MED ORDER — SODIUM CHLORIDE 0.9 % IV BOLUS (SEPSIS)
1000.0000 mL | Freq: Once | INTRAVENOUS | Status: AC
  Administered 2015-11-20: 1000 mL via INTRAVENOUS

## 2015-11-20 MED ORDER — LOPERAMIDE HCL 2 MG PO CAPS
4.0000 mg | ORAL_CAPSULE | Freq: Once | ORAL | Status: AC
  Administered 2015-11-20: 4 mg via ORAL
  Filled 2015-11-20: qty 2

## 2015-11-20 MED ORDER — IOPAMIDOL (ISOVUE-300) INJECTION 61%
100.0000 mL | Freq: Once | INTRAVENOUS | Status: AC | PRN
  Administered 2015-11-20: 100 mL via INTRAVENOUS

## 2015-11-20 NOTE — ED Notes (Signed)
Pt c/o diarrhea and upper abdominal pain since this morning. Per family, pt "messed herself" twice this morning. Pt also having back and central chest pain. Family sts this has been intermittent "for awhile now." A&Ox4 and incredibly hard of hearing. Pt denies SOB, N/V.

## 2015-11-20 NOTE — ED Provider Notes (Signed)
Attempted to medically screen the pt but she was not in the room  Cindy BilisKevin Shem Plemmons, MD 11/20/15 1447

## 2015-11-20 NOTE — Telephone Encounter (Signed)
FYI

## 2015-11-20 NOTE — Discharge Instructions (Signed)
Take Imodium for the diarrhea. Drink plenty of fluids. Follow-up with your family doctor in 2 days for recheck. Return if profuse diarrhea or vomiting

## 2015-11-20 NOTE — ED Provider Notes (Signed)
CSN: [id number]     Arrival date & time 11/20/15  1407 History   First MD Initiated Contact with Patient 11/20/15 1438     Chief Complaint  Patient presents with  . Diarrhea  . Abdominal Pain  . Chest Pain     (Consider location/radiation/quality/duration/timing/severity/associated sxs/prior Treatment) Patient is a 82 y.o. female presenting with diarrhea, abdominal pain, and chest pain. The history is provided by a relative (The son states that the patient has had 3 episodes of diarrhea today no blood in her diarrhea no vomiting).  Diarrhea Quality:  Semi-solid Severity:  Mild Onset quality:  Sudden Timing:  Rare Progression:  Unchanged Associated symptoms: abdominal pain   Associated symptoms: no headaches   Abdominal Pain Associated symptoms: chest pain and diarrhea   Associated symptoms: no cough, no fatigue and no hematuria   Chest Pain Associated symptoms: abdominal pain   Associated symptoms: no back pain, no cough, no fatigue and no headache     Past Medical History  Diagnosis Date  . ANEMIA, DEFICIENCY, HX OF 03/01/2010  . DEPRESSIVE DISORDER 09/19/2010  . DM w/o Complication Type II 05/18/2007  . GERD 05/18/2007  . HYPERLIPIDEMIA 03/17/2008  . HYPERTENSION 05/18/2007  . INSOMNIA 11/23/2008  . Loss of weight 05/30/2010  . Kidney stone   . Glaucoma   . Anxiety    Past Surgical History  Procedure Laterality Date  . Abdominal hysterectomy    . Cataract extraction    . Hemorrhoid surgery    . Appendectomy    . Pacemaker insertion  09/2011  . Dilation and curettage of uterus    . Colonoscopy  06-28-10    per Dr. Arlyce Dice, diverticulosis only   . Esophagogastroduodenoscopy  05-13-11    per Dr. Arlyce Dice, normal   . Permanent pacemaker insertion N/A 09/30/2011    Procedure: PERMANENT PACEMAKER INSERTION;  Surgeon: Marinus Maw, MD;  [facility] CATH LAB;  Service: Cardiovascular;  Laterality: N/A;   Family History  Problem Relation Age of Onset  . Brain cancer Father    . Colon cancer Sister   . Colitis Brother   . Heart attack Brother    Social History  Substance Use Topics  . Smoking status: Never Smoker   . Smokeless tobacco: Never Used  . Alcohol Use: No   OB History    Gravida Para Term Preterm AB TAB SAB Ectopic Multiple Living            3     Review of Systems  Constitutional: Negative for appetite change and fatigue.  HENT: Negative for congestion, ear discharge and sinus pressure.   Eyes: Negative for discharge.  Respiratory: Negative for cough.   Cardiovascular: Positive for chest pain.  Gastrointestinal: Positive for abdominal pain and diarrhea.  Genitourinary: Negative for frequency and hematuria.  Musculoskeletal: Negative for back pain.  Skin: Negative for rash.  Neurological: Negative for seizures and headaches.  Psychiatric/Behavioral: Negative for hallucinations.      Allergies  Amoxicillin and Penicillins  Home Medications   Prior to Admission medications   Medication Sig Start Date End Date Taking? Authorizing Provider  acetaminophen (TYLENOL) 500 MG tablet Take 1,000 mg by mouth every 6 (six) hours as needed for moderate pain (pain). For pain   Yes Historical Provider, MD  famotidine (PEPCID) 20 MG tablet TAKE 1 TABLET BY MOUTH TWICE A DAY 07/03/15  Yes Gordy Savers, MD  ferrous sulfate 325 (65 FE) MG tablet Take 1 tablet (325 mg total) by  mouth daily with breakfast. 05/11/15  Yes Gordy Savers, MD  furosemide (LASIX) 20 MG tablet TAKE 1 TABLET BY MOUTH EVERY DAY 06/27/15  Yes Gordy Savers, MD  lisinopril (PRINIVIL,ZESTRIL) 5 MG tablet TAKE 2 TABLETS BY MOUTH EVERY DAY Patient taking differently: take s twice daily 07/26/15  Yes Gordy Savers, MD  LORazepam (ATIVAN) 0.5 MG tablet TAKE ONE TABLET BY MOUTH THREE TIMES DAILY AS NEEDED 10/06/15  Yes Gordy Savers, MD  Multiple Vitamin (MULTIVITAMIN WITH MINERALS) TABS tablet Take 1 tablet by mouth daily.   Yes Historical Provider, MD   sertraline (ZOLOFT) 50 MG tablet TAKE 1 TABLET BY MOUTH EVERY DAY 05/01/15  Yes Shelva Majestic, MD  simvastatin (ZOCOR) 40 MG tablet Take 1 tablet (40 mg total) by mouth at bedtime. 10/26/15  Yes Gordy Savers, MD  traMADol (ULTRAM) 50 MG tablet TAKE ONE TABLET BY MOUTH EVERY 8 HOURS AS NEEDED FOR PAIN 09/21/15  Yes Gordy Savers, MD  oseltamivir (TAMIFLU) 75 MG capsule Take 1 capsule (75 mg total) by mouth daily. X 10 days. 10/27/15   Gordy Savers, MD   BP 180/87 mmHg  Pulse 80  Temp(Src) 98.4 F (36.9 C) (Oral)  Resp 17  SpO2 100% Physical Exam  Constitutional: She is oriented to person, place, and time. She appears well-developed.  HENT:  Head: Normocephalic.  Eyes: Conjunctivae and EOM are normal. No scleral icterus.  Neck: Neck supple. No thyromegaly present.  Cardiovascular: Normal rate and regular rhythm.  Exam reveals no gallop and no friction rub.   No murmur heard. Pulmonary/Chest: No stridor. She has no wheezes. She has no rales. She exhibits no tenderness.  Abdominal: She exhibits no distension. There is no tenderness. There is no rebound.  Musculoskeletal: Normal range of motion. She exhibits no edema.  Lymphadenopathy:    She has no cervical adenopathy.  Neurological: She is oriented to person, place, and time. She exhibits normal muscle tone. Coordination normal.  Skin: No rash noted. No erythema.  Psychiatric: She has a normal mood and affect. Her behavior is normal.    ED Course  Procedures (including critical care time) Labs Review Labs Reviewed  COMPREHENSIVE METABOLIC PANEL - Abnormal; Notable for the following:    Glucose, Bld 101 (*)    BUN 21 (*)    ALT 13 (*)    GFR calc non Af Amer 55 (*)    All other components within normal limits  URINALYSIS, ROUTINE W REFLEX MICROSCOPIC (NOT AT [facility]) - Abnormal; Notable for the following:    Leukocytes, UA TRACE (*)    All other components within normal limits  URINE MICROSCOPIC-ADD ON -  Abnormal; Notable for the following:    Squamous Epithelial / LPF 0-5 (*)    Bacteria, UA RARE (*)    All other components within normal limits  LIPASE, BLOOD  CBC  I-STAT TROPOININ, ED    Imaging Review Dg Chest 2 View  11/20/2015  CLINICAL DATA:  Chest pain EXAM: CHEST  2 VIEW COMPARISON:  12/24/2014 FINDINGS: Mild cardiac enlargement. Dual lead pacemaker unchanged. Negative for heart failure. Lungs remain clear without infiltrate or effusion. No mass lesion. IMPRESSION: No active cardiopulmonary disease. Electronically Signed   By: Marlan Palau M.D.   On: 11/20/2015 14:59   Ct Abdomen Pelvis W Contrast  11/20/2015  CLINICAL DATA:  Diarrhea and upper abdominal pain since this morning EXAM: CT ABDOMEN AND PELVIS WITH CONTRAST TECHNIQUE: Multidetector CT imaging of the abdomen  and pelvis was performed using the standard protocol following bolus administration of intravenous contrast. CONTRAST:  100mL ISOVUE-300 IOPAMIDOL (ISOVUE-300) INJECTION 61% COMPARISON:  05/27/2012 FINDINGS: Lower chest:  Stable cardiac enlargement.  Cardiac pacer noted. Hepatobiliary: Liver and gallbladder are normal. Pancreas: Pancreas is normal Spleen: Spleen is normal Adrenals/Urinary Tract: Adrenal glands are normal. Minimal vascular calcification in the right renal hilum. Kidneys enhance normally bilaterally. No hydronephrosis. Bilateral extrarenal pelvises. Bladder normal. Adrenal glands normal. Stomach/Bowel: There is significant diverticulosis of the sigmoid colon without evidence of diverticulitis. Fluid contents are seen throughout most of the large bowel consistent with history of diarrhea type illness. Appendix not identified. Small bowel is normal. Stomach is normal except for small hiatal hernia. Vascular/Lymphatic: Heavy atherosclerotic calcification of the aortoiliac and splanchnic vessels. Reproductive: Uterus not identified.  No pelvic masses. Other: No ascites Musculoskeletal: No acute osseous abnormalities.  IMPRESSION: Findings consistent with diarrhea type illness. Additionally there is sigmoid colon diverticulosis without evidence of diverticulitis. Electronically Signed   By: Esperanza Heiraymond  Rubner M.D.   On: 11/20/2015 18:47   I have personally reviewed and evaluated these images and lab results as part of my medical decision-making.   EKG Interpretation None      MDM   Final diagnoses:  Diarrhea in adult patient    Labs unremarkable. CT scan chest shows a diarrhea in her colon. Patient wishes to go home. Patient has had only 3 episodes of diarrhea and she has had no diarrhea in the last 5 hours. She will be discharged told to take some Imodium replace fluids and return if any problems    Bethann BerkshireJoseph Jaque Dacy, MD 11/20/15 [phone]

## 2015-11-20 NOTE — Telephone Encounter (Signed)
Patient Name: Cindy SheetsLUCILLE Younis DOB: Feb 14, 1923 Initial Comment Caller states mother has had bad diarrhea twice this morning, gave her Pepto, not helping Nurse Assessment Nurse: Elijah Birkaldwell, RN, Lynda Date/Time (Eastern Time): 11/20/2015 12:58:40 PM Confirm and document reason for call. If symptomatic, describe symptoms. You must click the next button to save text entered. ---Caller states his mother has had bad diarrhea twice this morning, messed her clothes. Gave her Pepto twice, not helping. Symptoms just started today. Has c/o chest hurting and her back, like a nail sticking her in the back, no difficulty with her breathing that is noticeable. Her BP was 147/78. Has the patient traveled out of the country within the last 30 days? ---Not Applicable Does the patient have any new or worsening symptoms? ---Yes Will a triage be completed? ---Yes Related visit to physician within the last 2 weeks? ---No Does the PT have any chronic conditions? (i.e. diabetes, asthma, etc.) ---Yes List chronic conditions. ---dementia, on BP rx, ? Metformin, on a water pill, on iron Is this a behavioral health or substance abuse call? ---No Guidelines Guideline Title Affirmed Question Affirmed Notes Chest Pain [1] Chest pain lasts > 5 minutes AND [2] age > 4250 Final Disposition User Call EMS 911 Now Seafordaldwell, RN, Stark BrayLynda Comments Caller is beside himself, not sure what he needs to do as he is having to take care of his mother himself. Needs help himself as he is unable to get out or do for himself. Referrals [facility] Disagree/Comply: Comply

## 2015-12-14 LAB — CUP PACEART REMOTE DEVICE CHECK
Battery Remaining Percentage: 100 %
Brady Statistic RA Percent Paced: 52 %
Date Time Interrogation Session: 20170313041100
Implantable Lead Implant Date: 20130128
Implantable Lead Location: 753860
Lead Channel Setting Pacing Amplitude: 2 V
Lead Channel Setting Sensing Sensitivity: 2.5 mV
MDC IDC LEAD IMPLANT DT: 20130128
MDC IDC LEAD LOCATION: 753859
MDC IDC LEAD MODEL: 4135
MDC IDC LEAD MODEL: 4136
MDC IDC LEAD SERIAL: 29117362
MDC IDC LEAD SERIAL: 29118103
MDC IDC MSMT BATTERY REMAINING LONGEVITY: 84 mo
MDC IDC MSMT LEADCHNL RA IMPEDANCE VALUE: 621 Ohm
MDC IDC MSMT LEADCHNL RV IMPEDANCE VALUE: 910 Ohm
MDC IDC PG SERIAL: 123684
MDC IDC SET LEADCHNL RV PACING AMPLITUDE: 2.4 V
MDC IDC SET LEADCHNL RV PACING PULSEWIDTH: 0.4 ms
MDC IDC STAT BRADY RV PERCENT PACED: 1 %

## 2015-12-15 ENCOUNTER — Ambulatory Visit: Payer: Medicare Other | Admitting: Internal Medicine

## 2015-12-18 ENCOUNTER — Encounter: Payer: Self-pay | Admitting: Internal Medicine

## 2015-12-18 ENCOUNTER — Ambulatory Visit (INDEPENDENT_AMBULATORY_CARE_PROVIDER_SITE_OTHER): Payer: Medicare Other | Admitting: Internal Medicine

## 2015-12-18 VITALS — BP 150/72 | HR 60 | Temp 99.2°F | Resp 20 | Ht 63.0 in | Wt 154.0 lb

## 2015-12-18 DIAGNOSIS — I10 Essential (primary) hypertension: Secondary | ICD-10-CM

## 2015-12-18 DIAGNOSIS — R52 Pain, unspecified: Secondary | ICD-10-CM

## 2015-12-18 DIAGNOSIS — R1013 Epigastric pain: Secondary | ICD-10-CM

## 2015-12-18 DIAGNOSIS — I480 Paroxysmal atrial fibrillation: Secondary | ICD-10-CM | POA: Diagnosis not present

## 2015-12-18 MED ORDER — DONEPEZIL HCL 5 MG PO TABS: 5.0000 mg | ORAL_TABLET | Freq: Every day | ORAL | Status: DC

## 2015-12-18 NOTE — Progress Notes (Signed)
Pre visit review using our clinic review tool, if applicable. No additional management support is needed unless otherwise documented below in the visit note. 

## 2015-12-18 NOTE — Progress Notes (Signed)
Subjective:    Patient ID: Cindy ComasLucille M Dauzat, female    DOB: 18-Sep-1922, 82 y.o.   MRN: [id number]  HPI 82 year old patient who is seen today for follow-up.  She has a history of essential hypertension as well as paroxysmal atrial fibrillation.  She has diet-controlled diabetes.  She has a history of anxiety and mild dementia.  She has seen cardiology in the past for paroxysmal atrial fibrillation. She was seen in the ED recently for diarrhea and laboratory studies were reviewed.  Son asked about possible treatment for memory deficit.  Options discussed  Lab Results  Component Value Date   HGBA1C 6.2 05/11/2015   Past Medical History  Diagnosis Date  . ANEMIA, DEFICIENCY, HX OF 03/01/2010  . DEPRESSIVE DISORDER 09/19/2010  . DM w/o Complication Type II 05/18/2007  . GERD 05/18/2007  . HYPERLIPIDEMIA 03/17/2008  . HYPERTENSION 05/18/2007  . INSOMNIA 11/23/2008  . Loss of weight 05/30/2010  . Kidney stone   . Glaucoma   . Anxiety      Social History   Social History  . Marital Status: Widowed    Spouse Name: N/A  . Number of Children: 3  . Years of Education: N/A   Occupational History  . retired    Social History Main Topics  . Smoking status: Never Smoker   . Smokeless tobacco: Never Used  . Alcohol Use: No  . Drug Use: No  . Sexual Activity: Not on file   Other Topics Concern  . Not on file   Social History Narrative    Past Surgical History  Procedure Laterality Date  . Abdominal hysterectomy    . Cataract extraction    . Hemorrhoid surgery    . Appendectomy    . Pacemaker insertion  09/2011  . Dilation and curettage of uterus    . Colonoscopy  06-28-10    per Dr. Arlyce DiceKaplan, diverticulosis only   . Esophagogastroduodenoscopy  05-13-11    per Dr. Arlyce DiceKaplan, normal   . Permanent pacemaker insertion N/A 09/30/2011    Procedure: PERMANENT PACEMAKER INSERTION;  Surgeon: Marinus MawGregg W Taylor, MD;  Location: [facility];  Service: Cardiovascular;  Laterality: N/A;    Family  History  Problem Relation Age of Onset  . Brain cancer Father   . Colon cancer Sister   . Colitis Brother   . Heart attack Brother     Allergies  Allergen Reactions  . Amoxicillin Rash  . Penicillins Rash    Has patient had a PCN reaction causing immediate rash, facial/tongue/throat swelling, SOB or lightheadedness with hypotension: unknown Has patient had a PCN reaction causing severe rash involving mucus membranes or skin necrosis: unkonwn Has patient had a PCN reaction that required hospitalization unknown Has patient had a PCN reaction occurring within the last 10 years: No If all of the above ansers are "NO", then may proceed with Cephalosporin use.     Current Outpatient Prescriptions on File Prior to Visit  Medication Sig Dispense Refill  . acetaminophen (TYLENOL) 500 MG tablet Take 1,000 mg by mouth every 6 (six) hours as needed for moderate pain (pain). For pain    . famotidine (PEPCID) 20 MG tablet TAKE 1 TABLET BY MOUTH TWICE A DAY 180 tablet 1  . ferrous sulfate 325 (65 FE) MG tablet Take 1 tablet (325 mg total) by mouth daily with breakfast.  3  . furosemide (LASIX) 20 MG tablet TAKE 1 TABLET BY MOUTH EVERY DAY 90 tablet 1  . lisinopril (PRINIVIL,ZESTRIL) 5  MG tablet TAKE 2 TABLETS BY MOUTH EVERY DAY (Patient taking differently: take s twice daily) 60 tablet 5  . LORazepam (ATIVAN) 0.5 MG tablet TAKE ONE TABLET BY MOUTH THREE TIMES DAILY AS NEEDED 90 tablet 2  . Multiple Vitamin (MULTIVITAMIN WITH MINERALS) TABS tablet Take 1 tablet by mouth daily.    . sertraline (ZOLOFT) 50 MG tablet TAKE 1 TABLET BY MOUTH EVERY DAY 90 tablet 2  . simvastatin (ZOCOR) 40 MG tablet Take 1 tablet (40 mg total) by mouth at bedtime. 90 tablet 1  . traMADol (ULTRAM) 50 MG tablet TAKE ONE TABLET BY MOUTH EVERY 8 HOURS AS NEEDED FOR PAIN 90 tablet 2   No current facility-administered medications on file prior to visit.    BP 150/72 mmHg  Pulse 60  Temp(Src) 99.2 F (37.3 C) (Oral)   Resp 20  Ht  (1.6 m)  Wt 154 lb (69.854 kg)  BMI 27.29 kg/m2  SpO2 97%      Review of Systems  Constitutional: Negative.   HENT: Negative for congestion, dental problem, hearing loss, rhinorrhea, sinus pressure, sore throat and tinnitus.   Eyes: Negative for pain, discharge and visual disturbance.  Respiratory: Negative for cough and shortness of breath.   Cardiovascular: Negative for chest pain, palpitations and leg swelling.  Gastrointestinal: Negative for nausea, vomiting, abdominal pain, diarrhea, constipation, blood in stool and abdominal distention.  Genitourinary: Negative for dysuria, urgency, frequency, hematuria, flank pain, vaginal bleeding, vaginal discharge, difficulty urinating, vaginal pain and pelvic pain.  Musculoskeletal: Negative for joint swelling, arthralgias and gait problem.  Skin: Negative for rash.  Neurological: Negative for dizziness, syncope, speech difficulty, weakness, numbness and headaches.  Hematological: Negative for adenopathy.  Psychiatric/Behavioral: Positive for confusion and decreased concentration. Negative for behavioral problems, dysphoric mood and agitation. The patient is nervous/anxious.        Objective:   Physical Exam  Constitutional: She is oriented to person, place, and time. She appears well-developed and well-nourished. No distress.  HENT:  Head: Normocephalic.  Right Ear: External ear normal.  Left Ear: External ear normal.  Mouth/Throat: Oropharynx is clear and moist.  Eyes: Conjunctivae and EOM are normal. Pupils are equal, round, and reactive to light.  Neck: Normal range of motion. Neck supple. No thyromegaly present.  Cardiovascular: Normal rate, regular rhythm, normal heart sounds and intact distal pulses.   Pulmonary/Chest: Effort normal and breath sounds normal.  Abdominal: Soft. Bowel sounds are normal. She exhibits no mass. There is no tenderness.  Musculoskeletal: Normal range of motion.  Lymphadenopathy:     She has no cervical adenopathy.  Neurological: She is alert and oriented to person, place, and time.  Skin: Skin is warm and dry. No rash noted.  Psychiatric: She has a normal mood and affect. Her behavior is normal.  Pleasantly confused          Assessment & Plan:   Hypertension, well-controlled Paroxysmal atrial fibrillation Mild dementia.  Options discussed.  Will start Aricept 5.  Recheck 6 months Anxiety disorder  Diet-controlled diabetes.  Recent random blood sugar 101  Otherwise, no change in medical regimen Recheck 6 months

## 2015-12-18 NOTE — Patient Instructions (Signed)
Limit your sodium (Salt) intake  Return in 6 months for follow-up  

## 2015-12-19 ENCOUNTER — Encounter: Payer: Self-pay | Admitting: Cardiology

## 2016-01-30 ENCOUNTER — Other Ambulatory Visit: Payer: Self-pay | Admitting: Internal Medicine

## 2016-02-01 ENCOUNTER — Other Ambulatory Visit: Payer: Self-pay | Admitting: Internal Medicine

## 2016-02-12 ENCOUNTER — Ambulatory Visit (INDEPENDENT_AMBULATORY_CARE_PROVIDER_SITE_OTHER): Payer: Medicare Other | Admitting: *Deleted

## 2016-02-12 DIAGNOSIS — I480 Paroxysmal atrial fibrillation: Secondary | ICD-10-CM

## 2016-02-12 DIAGNOSIS — R001 Bradycardia, unspecified: Secondary | ICD-10-CM

## 2016-02-12 DIAGNOSIS — Z950 Presence of cardiac pacemaker: Secondary | ICD-10-CM

## 2016-02-12 NOTE — Progress Notes (Signed)
Remote pacemaker transmission.   

## 2016-02-19 ENCOUNTER — Other Ambulatory Visit: Payer: Self-pay | Admitting: Internal Medicine

## 2016-02-19 LAB — CUP PACEART REMOTE DEVICE CHECK
Brady Statistic RV Percent Paced: 1 %
Date Time Interrogation Session: 20170612041100
Implantable Lead Location: 753860
Implantable Lead Model: 4135
Implantable Lead Model: 4136
Implantable Lead Serial Number: 29117362
Lead Channel Impedance Value: 615 Ohm
Lead Channel Pacing Threshold Amplitude: 0.6 V
MDC IDC LEAD IMPLANT DT: 20130128
MDC IDC LEAD IMPLANT DT: 20130128
MDC IDC LEAD LOCATION: 753859
MDC IDC LEAD SERIAL: 29118103
MDC IDC MSMT BATTERY REMAINING LONGEVITY: 84 mo
MDC IDC MSMT BATTERY REMAINING PERCENTAGE: 100 %
MDC IDC MSMT LEADCHNL RA PACING THRESHOLD PULSEWIDTH: 0.4 ms
MDC IDC MSMT LEADCHNL RV IMPEDANCE VALUE: 1000 Ohm
MDC IDC SET LEADCHNL RA PACING AMPLITUDE: 2 V
MDC IDC SET LEADCHNL RV PACING AMPLITUDE: 2.4 V
MDC IDC SET LEADCHNL RV PACING PULSEWIDTH: 0.4 ms
MDC IDC SET LEADCHNL RV SENSING SENSITIVITY: 2.5 mV
MDC IDC STAT BRADY RA PERCENT PACED: 51 %
Pulse Gen Serial Number: 123684

## 2016-02-22 ENCOUNTER — Encounter: Payer: Self-pay | Admitting: Cardiology

## 2016-03-04 ENCOUNTER — Other Ambulatory Visit: Payer: Self-pay | Admitting: Internal Medicine

## 2016-03-04 ENCOUNTER — Other Ambulatory Visit: Payer: Self-pay | Admitting: Family Medicine

## 2016-03-04 NOTE — Telephone Encounter (Signed)
Okay to refill? 

## 2016-04-13 ENCOUNTER — Other Ambulatory Visit: Payer: Self-pay | Admitting: Internal Medicine

## 2016-04-15 NOTE — Telephone Encounter (Signed)
Okay to refill? 

## 2016-04-27 ENCOUNTER — Other Ambulatory Visit: Payer: Self-pay | Admitting: Internal Medicine

## 2016-05-13 ENCOUNTER — Ambulatory Visit (INDEPENDENT_AMBULATORY_CARE_PROVIDER_SITE_OTHER): Payer: Medicare Other | Admitting: *Deleted

## 2016-05-13 DIAGNOSIS — I480 Paroxysmal atrial fibrillation: Secondary | ICD-10-CM

## 2016-05-13 DIAGNOSIS — Z950 Presence of cardiac pacemaker: Secondary | ICD-10-CM | POA: Diagnosis not present

## 2016-05-13 NOTE — Progress Notes (Signed)
Remote pacemaker transmission.   

## 2016-05-16 ENCOUNTER — Encounter: Payer: Self-pay | Admitting: Cardiology

## 2016-05-16 ENCOUNTER — Ambulatory Visit (INDEPENDENT_AMBULATORY_CARE_PROVIDER_SITE_OTHER): Payer: Medicare Other

## 2016-05-16 DIAGNOSIS — Z23 Encounter for immunization: Secondary | ICD-10-CM | POA: Diagnosis not present

## 2016-05-28 LAB — CUP PACEART REMOTE DEVICE CHECK
Battery Remaining Percentage: 100 %
Implantable Lead Implant Date: 20130128
Implantable Lead Location: 753859
Implantable Lead Location: 753860
Implantable Lead Model: 4135
Implantable Lead Serial Number: 29117362
Lead Channel Impedance Value: 914 Ohm
Lead Channel Pacing Threshold Pulse Width: 0.4 ms
Lead Channel Setting Pacing Amplitude: 2.4 V
Lead Channel Setting Sensing Sensitivity: 2.5 mV
MDC IDC LEAD IMPLANT DT: 20130128
MDC IDC LEAD MODEL: 4136
MDC IDC LEAD SERIAL: 29118103
MDC IDC MSMT BATTERY REMAINING LONGEVITY: 78 mo
MDC IDC MSMT LEADCHNL RA IMPEDANCE VALUE: 657 Ohm
MDC IDC MSMT LEADCHNL RA PACING THRESHOLD AMPLITUDE: 0.6 V
MDC IDC SESS DTM: 20170911041100
MDC IDC SET LEADCHNL RA PACING AMPLITUDE: 2 V
MDC IDC SET LEADCHNL RV PACING PULSEWIDTH: 0.4 ms
MDC IDC STAT BRADY RA PERCENT PACED: 51 %
MDC IDC STAT BRADY RV PERCENT PACED: 1 %
Pulse Gen Serial Number: 123684

## 2016-06-10 ENCOUNTER — Other Ambulatory Visit: Payer: Self-pay | Admitting: Internal Medicine

## 2016-06-10 NOTE — Telephone Encounter (Signed)
Refill of Tramadol Last Ov:12/18/15  Pending Ov: 06/18/16 Last Rx: 02/01/16 #90 2 RF Please advise

## 2016-06-11 ENCOUNTER — Other Ambulatory Visit: Payer: Self-pay | Admitting: *Deleted

## 2016-06-11 MED ORDER — TRAMADOL HCL 50 MG PO TABS: 50.0000 mg | ORAL_TABLET | Freq: Three times a day (TID) | ORAL | 3 refills | Status: DC | PRN

## 2016-06-18 ENCOUNTER — Encounter: Payer: Self-pay | Admitting: Internal Medicine

## 2016-06-18 ENCOUNTER — Ambulatory Visit (INDEPENDENT_AMBULATORY_CARE_PROVIDER_SITE_OTHER): Payer: Medicare Other | Admitting: Internal Medicine

## 2016-06-18 VITALS — BP 130/68 | HR 73 | Temp 99.0°F | Resp 18 | Ht 63.0 in | Wt 157.0 lb

## 2016-06-18 DIAGNOSIS — I10 Essential (primary) hypertension: Secondary | ICD-10-CM | POA: Diagnosis not present

## 2016-06-18 DIAGNOSIS — F419 Anxiety disorder, unspecified: Secondary | ICD-10-CM

## 2016-06-18 DIAGNOSIS — E785 Hyperlipidemia, unspecified: Secondary | ICD-10-CM | POA: Diagnosis not present

## 2016-06-18 DIAGNOSIS — E119 Type 2 diabetes mellitus without complications: Secondary | ICD-10-CM

## 2016-06-18 DIAGNOSIS — Z862 Personal history of diseases of the blood and blood-forming organs and certain disorders involving the immune mechanism: Secondary | ICD-10-CM

## 2016-06-18 LAB — CBC WITH DIFFERENTIAL/PLATELET
BASOS ABS: 0 10*3/uL (ref 0.0–0.1)
BASOS PCT: 0.2 % (ref 0.0–3.0)
EOS ABS: 0.1 10*3/uL (ref 0.0–0.7)
Eosinophils Relative: 0.6 % (ref 0.0–5.0)
HEMATOCRIT: 36.4 % (ref 36.0–46.0)
HEMOGLOBIN: 12.3 g/dL (ref 12.0–15.0)
LYMPHS PCT: 16.1 % (ref 12.0–46.0)
Lymphs Abs: 1.9 10*3/uL (ref 0.7–4.0)
MCHC: 33.7 g/dL (ref 30.0–36.0)
MCV: 93 fl (ref 78.0–100.0)
MONOS PCT: 4.2 % (ref 3.0–12.0)
Monocytes Absolute: 0.5 10*3/uL (ref 0.1–1.0)
NEUTROS ABS: 9.3 10*3/uL — AB (ref 1.4–7.7)
Neutrophils Relative %: 78.9 % — ABNORMAL HIGH (ref 43.0–77.0)
PLATELETS: 249 10*3/uL (ref 150.0–400.0)
RBC: 3.92 Mil/uL (ref 3.87–5.11)
RDW: 12.7 % (ref 11.5–15.5)
WBC: 11.8 10*3/uL — AB (ref 4.0–10.5)

## 2016-06-18 LAB — LDL CHOLESTEROL, DIRECT: LDL DIRECT: 117 mg/dL

## 2016-06-18 LAB — COMPREHENSIVE METABOLIC PANEL
ALBUMIN: 4.2 g/dL (ref 3.5–5.2)
ALT: 8 U/L (ref 0–35)
AST: 10 U/L (ref 0–37)
Alkaline Phosphatase: 44 U/L (ref 39–117)
BILIRUBIN TOTAL: 0.5 mg/dL (ref 0.2–1.2)
BUN: 29 mg/dL — ABNORMAL HIGH (ref 6–23)
CALCIUM: 9.3 mg/dL (ref 8.4–10.5)
CHLORIDE: 102 meq/L (ref 96–112)
CO2: 30 meq/L (ref 19–32)
Creatinine, Ser: 1.09 mg/dL (ref 0.40–1.20)
GFR: 49.99 mL/min — AB (ref >60.00)
Glucose, Bld: 118 mg/dL — ABNORMAL HIGH (ref 70–99)
Potassium: 4.2 mEq/L (ref 3.5–5.1)
Sodium: 142 mEq/L (ref 135–145)
Total Protein: 7.2 g/dL (ref 6.0–8.3)

## 2016-06-18 LAB — TSH: TSH: 3.04 u[IU]/mL (ref 0.35–4.50)

## 2016-06-18 LAB — LIPID PANEL
CHOL/HDL RATIO: 3
CHOLESTEROL: 205 mg/dL — AB (ref 0–200)
HDL: 58.6 mg/dL (ref >39.00)
NonHDL: 146.03
TRIGLYCERIDES: 347 mg/dL — AB (ref 0.0–149.0)
VLDL: 69.4 mg/dL — AB (ref 0.0–40.0)

## 2016-06-18 LAB — HEMOGLOBIN A1C: Hgb A1c MFr Bld: 6 % (ref 4.6–6.5)

## 2016-06-18 MED ORDER — LORAZEPAM 0.5 MG PO TABS: 0.5000 mg | ORAL_TABLET | Freq: Three times a day (TID) | ORAL | 2 refills | Status: DC | PRN

## 2016-06-18 NOTE — Progress Notes (Signed)
Pre visit review using our clinic review tool, if applicable. No additional management support is needed unless otherwise documented below in the visit note. 

## 2016-06-18 NOTE — Progress Notes (Signed)
Subjective:    Patient ID: [name], female    DOB: 18-Jul-1923, 82 y.o.   MRN: [id number]  HPI  82 year old patient who is seen today for her six-month follow-up. She has a history of iron deficiency anemia.  She was seen in the ED in March of this year and follow-up CBC revealed normal indices.  She also had an abdominal CT scan performed at that time. She is doing fairly well.  She is coming by her son, [name] today.  She has essential hypertension and also history of diet-controlled diabetes.  She has a history of paroxysmal atrial fibrillation.  She has mild dementia and anxiety disorder. No new concerns or complaints  Colonoscopy 2011  Wt Readings from Last 3 Encounters:  06/18/16 157 lb (71.2 kg)  12/18/15 154 lb (69.9 kg)  08/15/15 149 lb (67.6 kg)    Past Medical History:  Diagnosis Date  . ANEMIA, DEFICIENCY, HX OF 03/01/2010  . Anxiety   . DEPRESSIVE DISORDER 09/19/2010  . DM w/o Complication Type II 05/18/2007  . GERD 05/18/2007  . Glaucoma   . HYPERLIPIDEMIA 03/17/2008  . HYPERTENSION 05/18/2007  . INSOMNIA 11/23/2008  . Kidney stone   . Loss of weight 05/30/2010     Social History   Social History  . Marital status: Widowed    Spouse name: N/A  . Number of children: 3  . Years of education: N/A   Occupational History  . retired    Social History Main Topics  . Smoking status: Never Smoker  . Smokeless tobacco: Never Used  . Alcohol use No  . Drug use: No  . Sexual activity: Not on file   Other Topics Concern  . Not on file   Social History Narrative  . No narrative on file    Past Surgical History:  Procedure Laterality Date  . ABDOMINAL HYSTERECTOMY    . APPENDECTOMY    . CATARACT EXTRACTION    . COLONOSCOPY  06-28-10   per Dr. Arlyce DiceKaplan, diverticulosis only   . DILATION AND CURETTAGE OF UTERUS    . ESOPHAGOGASTRODUODENOSCOPY  05-13-11   per Dr. Arlyce DiceKaplan, normal   . HEMORRHOID SURGERY    . PACEMAKER INSERTION  09/2011  . PERMANENT  PACEMAKER INSERTION N/A 09/30/2011   Procedure: PERMANENT PACEMAKER INSERTION;  Surgeon: Marinus MawGregg W Taylor, MD;  [facility];  Service: Cardiovascular;  Laterality: N/A;    Family History  Problem Relation Age of Onset  . Brain cancer Father   . Colon cancer Sister   . Colitis Brother   . Heart attack Brother     Allergies  Allergen Reactions  . Amoxicillin Rash  . Penicillins Rash    Has patient had a PCN reaction causing immediate rash, facial/tongue/throat swelling, SOB or lightheadedness with hypotension: unknown Has patient had a PCN reaction causing severe rash involving mucus membranes or skin necrosis: unkonwn Has patient had a PCN reaction that required hospitalization unknown Has patient had a PCN reaction occurring within the last 10 years: No If all of the above ansers are "NO", then may proceed with Cephalosporin use.     Current Outpatient Prescriptions on File Prior to Visit  Medication Sig Dispense Refill  . acetaminophen (TYLENOL) 500 MG tablet Take 1,000 mg by mouth every 6 (six) hours as needed for moderate pain (pain). For pain    . famotidine (PEPCID) 20 MG tablet TAKE ONE TABLET BY MOUTH TWICE DAILY 180 tablet 0  . ferrous sulfate 325 (  65 FE) MG tablet Take 1 tablet (325 mg total) by mouth daily with breakfast.  3  . furosemide (LASIX) 20 MG tablet TAKE ONE TABLET BY MOUTH ONCE DAILY 90 tablet 0  . lisinopril (PRINIVIL,ZESTRIL) 5 MG tablet TAKE TWO TABLETS BY MOUTH ONCE DAILY 180 tablet 1  . Multiple Vitamin (MULTIVITAMIN WITH MINERALS) TABS tablet Take 1 tablet by mouth daily.    . sertraline (ZOLOFT) 50 MG tablet TAKE ONE TABLET BY MOUTH ONCE DAILY 90 tablet 1  . simvastatin (ZOCOR) 40 MG tablet Take 1 tablet (40 mg total) by mouth at bedtime. 90 tablet 1  . traMADol (ULTRAM) 50 MG tablet Take 1 tablet (50 mg total) by mouth every 8 (eight) hours as needed. for pain 90 tablet 3   No current facility-administered medications on file prior to visit.      BP 130/68 (BP Location: Left Arm, Patient Position: Sitting, Cuff Size: Normal)   Pulse 73   Temp 99 F (37.2 C) (Oral)   Resp 18   Ht 5\' 3"  (1.6 m)   Wt 157 lb (71.2 kg)   SpO2 97%   BMI 27.81 kg/m      Review of Systems  Constitutional: Negative.   HENT: Negative for congestion, dental problem, hearing loss, rhinorrhea, sinus pressure, sore throat and tinnitus.   Eyes: Negative for pain, discharge and visual disturbance.  Respiratory: Negative for cough and shortness of breath.   Cardiovascular: Negative for chest pain, palpitations and leg swelling.  Gastrointestinal: Negative for abdominal distention, abdominal pain, blood in stool, constipation, diarrhea, nausea and vomiting.  Genitourinary: Negative for difficulty urinating, dysuria, flank pain, frequency, hematuria, pelvic pain, urgency, vaginal bleeding, vaginal discharge and vaginal pain.  Musculoskeletal: Negative for arthralgias, gait problem and joint swelling.  Skin: Negative for rash.  Neurological: Negative for dizziness, syncope, speech difficulty, weakness, numbness and headaches.  Hematological: Negative for adenopathy.  Psychiatric/Behavioral: Positive for confusion and decreased concentration. Negative for agitation, behavioral problems and dysphoric mood. The patient is nervous/anxious.        Objective:   Physical Exam  Constitutional: She is oriented to person, place, and time. She appears well-developed and well-nourished.  HENT:  Head: Normocephalic.  Right Ear: External ear normal.  Left Ear: External ear normal.  Mouth/Throat: Oropharynx is clear and moist.  Eyes: Conjunctivae and EOM are normal. Pupils are equal, round, and reactive to light.  Neck: Normal range of motion. Neck supple. No thyromegaly present.  Cardiovascular: Normal rate, regular rhythm, normal heart sounds and intact distal pulses.   Pulmonary/Chest: Effort normal and breath sounds normal.  Abdominal: Soft. Bowel sounds are  normal. She exhibits no mass. There is no tenderness.  Musculoskeletal: Normal range of motion.  Osteoarthritic changes of the small joints of the hands  Lymphadenopathy:    She has no cervical adenopathy.  Neurological: She is alert and oriented to person, place, and time.  Skin: Skin is warm and dry. No rash noted.  Psychiatric: She has a normal mood and affect. Her behavior is normal.          Assessment & Plan:  Essential hypertension.  Blood pressure well controlled History of diet-controlled diabetes.  Will check hemoglobin A1c History of iron deficiency anemia.  Will check follow-up CBC Anxiety disorder Dementia Dyslipidemia.  Continue statin therapy  Review updated lab Recheck 6 months  Sui Kasparek Homero Fellers

## 2016-06-18 NOTE — Patient Instructions (Signed)
Limit your sodium (Salt) intake  Please check your blood pressure on a regular basis.  If it is consistently greater than 150/90, please make an office appointment.  Return in 6 months for follow-up  Continue iron supplement daily

## 2016-07-17 ENCOUNTER — Other Ambulatory Visit: Payer: Self-pay | Admitting: Internal Medicine

## 2016-07-30 ENCOUNTER — Other Ambulatory Visit: Payer: Self-pay | Admitting: Internal Medicine

## 2016-08-08 ENCOUNTER — Encounter: Payer: Self-pay | Admitting: *Deleted

## 2016-08-13 ENCOUNTER — Encounter: Payer: Self-pay | Admitting: Internal Medicine

## 2016-08-13 ENCOUNTER — Ambulatory Visit (INDEPENDENT_AMBULATORY_CARE_PROVIDER_SITE_OTHER): Payer: Medicare Other | Admitting: Internal Medicine

## 2016-08-13 ENCOUNTER — Encounter (INDEPENDENT_AMBULATORY_CARE_PROVIDER_SITE_OTHER): Payer: Self-pay

## 2016-08-13 ENCOUNTER — Other Ambulatory Visit: Payer: Self-pay | Admitting: Internal Medicine

## 2016-08-13 VITALS — BP 162/62 | HR 70 | Ht 63.0 in | Wt 162.0 lb

## 2016-08-13 DIAGNOSIS — Z950 Presence of cardiac pacemaker: Secondary | ICD-10-CM

## 2016-08-13 DIAGNOSIS — I480 Paroxysmal atrial fibrillation: Secondary | ICD-10-CM

## 2016-08-13 LAB — CUP PACEART INCLINIC DEVICE CHECK
Implantable Lead Implant Date: 20130128
Implantable Lead Implant Date: 20130128
Implantable Lead Location: 753859
Implantable Lead Serial Number: 29117362
Lead Channel Impedance Value: 621 Ohm
Lead Channel Pacing Threshold Amplitude: 0.4 V
Lead Channel Pacing Threshold Amplitude: 0.5 V
Lead Channel Setting Pacing Amplitude: 2.4 V
Lead Channel Setting Pacing Pulse Width: 0.4 ms
MDC IDC LEAD LOCATION: 753860
MDC IDC LEAD MODEL: 4135
MDC IDC LEAD MODEL: 4136
MDC IDC LEAD SERIAL: 29118103
MDC IDC MSMT LEADCHNL RA PACING THRESHOLD PULSEWIDTH: 0.4 ms
MDC IDC MSMT LEADCHNL RA SENSING INTR AMPL: 4.3 mV
MDC IDC MSMT LEADCHNL RV IMPEDANCE VALUE: 876 Ohm
MDC IDC MSMT LEADCHNL RV PACING THRESHOLD PULSEWIDTH: 0.4 ms
MDC IDC MSMT LEADCHNL RV SENSING INTR AMPL: 12 mV
MDC IDC PG IMPLANT DT: 20130128
MDC IDC SESS DTM: 20171212050000
MDC IDC SET LEADCHNL RA PACING AMPLITUDE: 2 V
MDC IDC SET LEADCHNL RV SENSING SENSITIVITY: 2.5 mV
Pulse Gen Serial Number: 123684

## 2016-08-13 NOTE — Patient Instructions (Signed)
Medication Instructions:  Your physician recommends that you continue on your current medications as directed. Please refer to the Current Medication list given to you today.   Labwork: None Ordered   Testing/Procedures: None Ordered   Follow-Up: Your physician wants you to follow-up in: 1 year with Dr. Taylor. You will receive a reminder letter in the mail two months in advance. If you don't receive a letter, please call our office to schedule the follow-up appointment.  Remote monitoring is used to monitor your Pacemaker  from home. This monitoring reduces the number of office visits required to check your device to one time per year. It allows us to keep an eye on the functioning of your device to ensure it is working properly. You are scheduled for a device check from home on 11/12/16. You may send your transmission at any time that day. If you have a wireless device, the transmission will be sent automatically. After your physician reviews your transmission, you will receive a postcard with your next transmission date.     Any Other Special Instructions Will Be Listed Below (If Applicable).     If you need a refill on your cardiac medications before your next appointment, please call your pharmacy.   

## 2016-08-13 NOTE — Progress Notes (Signed)
HPI [name] returns today for followup. She is a very pleasant elderly 82 yo woman with hypertension, symptomatic bradycardia, status post permanent pacemaker insertion, and is very hard of hearing. In the interim, she has had a marked worsening of her dementia. She denies shortness of breath. Minimal peripheral edema. No syncope. She is tearful and anxious. Allergies  Allergen Reactions  . Amoxicillin Rash  . Penicillins Rash    Has patient had a PCN reaction causing immediate rash, facial/tongue/throat swelling, SOB or lightheadedness with hypotension: unknown Has patient had a PCN reaction causing severe rash involving mucus membranes or skin necrosis: unkonwn Has patient had a PCN reaction that required hospitalization unknown Has patient had a PCN reaction occurring within the last 10 years: No If all of the above ansers are "NO", then may proceed with Cephalosporin use.      Current Outpatient Prescriptions  Medication Sig Dispense Refill  . acetaminophen (TYLENOL) 500 MG tablet Take 1,000 mg by mouth every 6 (six) hours as needed for moderate pain (pain). For pain    . famotidine (PEPCID) 20 MG tablet TAKE ONE TABLET BY MOUTH TWICE DAILY 180 tablet 1  . ferrous sulfate 325 (65 FE) MG tablet Take 1 tablet (325 mg total) by mouth daily with breakfast.  3  . furosemide (LASIX) 20 MG tablet TAKE ONE TABLET BY MOUTH ONCE DAILY 90 tablet 0  . lisinopril (PRINIVIL,ZESTRIL) 5 MG tablet TAKE TWO TABLETS BY MOUTH ONCE DAILY 180 tablet 1  . LORazepam (ATIVAN) 0.5 MG tablet TAKE ONE TABLET BY MOUTH THREE TIMES DAILY AS NEEDED 90 tablet 2  . Multiple Vitamin (MULTIVITAMIN WITH MINERALS) TABS tablet Take 1 tablet by mouth daily.    . sertraline (ZOLOFT) 50 MG tablet TAKE ONE TABLET BY MOUTH ONCE DAILY 90 tablet 1  . simvastatin (ZOCOR) 40 MG tablet TAKE ONE TABLET BY MOUTH AT BEDTIME 90 tablet 1  . traMADol (ULTRAM) 50 MG tablet Take 1 tablet (50 mg total) by mouth every 8 (eight) hours as  needed. for pain 90 tablet 3   No current facility-administered medications for this visit.      Past Medical History:  Diagnosis Date  . ANEMIA, DEFICIENCY, HX OF 03/01/2010  . Anxiety   . DEPRESSIVE DISORDER 09/19/2010  . DM w/o Complication Type II 05/18/2007  . GERD 05/18/2007  . Glaucoma   . HYPERLIPIDEMIA 03/17/2008  . HYPERTENSION 05/18/2007  . INSOMNIA 11/23/2008  . Kidney stone   . Loss of weight 05/30/2010    ROS:   All systems reviewed and negative except as noted in the HPI.   Past Surgical History:  Procedure Laterality Date  . ABDOMINAL HYSTERECTOMY    . APPENDECTOMY    . CATARACT EXTRACTION    . COLONOSCOPY  06-28-10   per Dr. Arlyce DiceKaplan, diverticulosis only   . DILATION AND CURETTAGE OF UTERUS    . ESOPHAGOGASTRODUODENOSCOPY  05-13-11   per Dr. Arlyce DiceKaplan, normal   . HEMORRHOID SURGERY    . PACEMAKER INSERTION  09/2011  . PERMANENT PACEMAKER INSERTION N/A 09/30/2011   Procedure: PERMANENT PACEMAKER INSERTION;  Surgeon: Marinus MawGregg W Taylor, MD;  Location: [facility];  Service: Cardiovascular;  Laterality: N/A;     Family History  Problem Relation Age of Onset  . Brain cancer Father   . Colon cancer Sister   . Colitis Brother   . Heart attack Brother      Social History   Social History  . Marital status: Widowed  Spouse name: N/A  . Number of children: 3  . Years of education: N/A   Occupational History  . retired    Social History Main Topics  . Smoking status: Never Smoker  . Smokeless tobacco: Never Used  . Alcohol use No  . Drug use: No  . Sexual activity: Not on file   Other Topics Concern  . Not on file   Social History Narrative  . No narrative on file     BP (!) 162/62 (BP Location: Right Arm, Patient Position: Sitting, Cuff Size: Normal)   Pulse 70   Ht 5\' 3"  (1.6 m)   Wt 162 lb (73.5 kg)   BMI 28.70 kg/m   Physical Exam:  Well appearing elderly woman, NAD HEENT: Unremarkable Neck:  7 cm JVD, no thyromegally Lungs:  Clear  with no wheezes, rales, or rhonchi. Well-healed pacemaker incision HEART:  Regular rate rhythm, no murmurs, no rubs, no clicks Abd:  soft, positive bowel sounds, no organomegally, no rebound, no guarding Ext:  2 plus pulses, no edema, no cyanosis, no clubbing Skin:  No rashes no nodules Neuro:  CN II through XII intact, motor grossly intact  DEVICE  Normal device function.  See PaceArt for details.   Assess/Plan:  1. PPM - her [place] [facility] DDD PM is working normally.  2. HTN - her blood pressure is elevated. With her multiple medical problems, will not aggressively pursue additional blood pressure lowering. 3. Dementia - very unfortunate. She appears to be fairly severely limited.  Leonia ReevesGregg Taylor,M.D.

## 2016-08-30 ENCOUNTER — Other Ambulatory Visit: Payer: Self-pay | Admitting: Internal Medicine

## 2016-09-21 ENCOUNTER — Other Ambulatory Visit: Payer: Self-pay | Admitting: Family Medicine

## 2016-09-23 ENCOUNTER — Other Ambulatory Visit: Payer: Self-pay | Admitting: Internal Medicine

## 2016-09-23 MED ORDER — SERTRALINE HCL 50 MG PO TABS: 50.0000 mg | ORAL_TABLET | Freq: Every day | ORAL | 1 refills | Status: DC

## 2016-09-27 ENCOUNTER — Telehealth: Payer: Self-pay | Admitting: Internal Medicine

## 2016-09-27 ENCOUNTER — Encounter: Payer: Self-pay | Admitting: Internal Medicine

## 2016-09-27 NOTE — Telephone Encounter (Signed)
See message below - please advise

## 2016-09-27 NOTE — Telephone Encounter (Signed)
Pt son richard need a letter to be excuse from jury duty. [name] is his mother primary care taker. [name] will bring jury summons letter

## 2016-09-30 DIAGNOSIS — Z961 Presence of intraocular lens: Secondary | ICD-10-CM | POA: Diagnosis not present

## 2016-09-30 DIAGNOSIS — E119 Type 2 diabetes mellitus without complications: Secondary | ICD-10-CM | POA: Diagnosis not present

## 2016-09-30 DIAGNOSIS — H52203 Unspecified astigmatism, bilateral: Secondary | ICD-10-CM | POA: Diagnosis not present

## 2016-09-30 LAB — HM DIABETES EYE EXAM

## 2016-09-30 NOTE — Telephone Encounter (Signed)
done

## 2016-10-11 ENCOUNTER — Encounter: Payer: Self-pay | Admitting: Internal Medicine

## 2016-10-31 ENCOUNTER — Other Ambulatory Visit: Payer: Self-pay | Admitting: Internal Medicine

## 2016-11-05 ENCOUNTER — Other Ambulatory Visit: Payer: Self-pay | Admitting: Internal Medicine

## 2016-11-07 ENCOUNTER — Other Ambulatory Visit: Payer: Self-pay | Admitting: Internal Medicine

## 2016-11-11 ENCOUNTER — Other Ambulatory Visit: Payer: Self-pay | Admitting: Internal Medicine

## 2016-11-12 ENCOUNTER — Ambulatory Visit (INDEPENDENT_AMBULATORY_CARE_PROVIDER_SITE_OTHER): Payer: Self-pay | Admitting: *Deleted

## 2016-11-12 DIAGNOSIS — R001 Bradycardia, unspecified: Secondary | ICD-10-CM

## 2016-11-12 NOTE — Progress Notes (Signed)
Remote pacemaker transmission.   

## 2016-11-13 ENCOUNTER — Encounter: Payer: Self-pay | Admitting: Cardiology

## 2016-11-15 LAB — CUP PACEART REMOTE DEVICE CHECK
Battery Remaining Longevity: 78 mo
Battery Remaining Percentage: 100 %
Brady Statistic RA Percent Paced: 40 %
Brady Statistic RV Percent Paced: 0 %
Implantable Lead Implant Date: 20130128
Implantable Lead Location: 753860
Implantable Lead Model: 4136
Implantable Lead Serial Number: 29117362
Implantable Lead Serial Number: 29118103
Lead Channel Impedance Value: 580 Ohm
Lead Channel Impedance Value: 946 Ohm
Lead Channel Pacing Threshold Amplitude: 0.5 V
Lead Channel Pacing Threshold Pulse Width: 0.4 ms
Lead Channel Setting Pacing Amplitude: 2 V
Lead Channel Setting Sensing Sensitivity: 2.5 mV
MDC IDC LEAD IMPLANT DT: 20130128
MDC IDC LEAD LOCATION: 753859
MDC IDC PG IMPLANT DT: 20130128
MDC IDC PG SERIAL: 123684
MDC IDC SESS DTM: 20180313041100
MDC IDC SET LEADCHNL RV PACING AMPLITUDE: 2.4 V
MDC IDC SET LEADCHNL RV PACING PULSEWIDTH: 0.4 ms

## 2016-11-17 ENCOUNTER — Other Ambulatory Visit: Payer: Self-pay | Admitting: Internal Medicine

## 2016-12-17 ENCOUNTER — Ambulatory Visit (INDEPENDENT_AMBULATORY_CARE_PROVIDER_SITE_OTHER): Payer: PPO | Admitting: Internal Medicine

## 2016-12-17 ENCOUNTER — Encounter: Payer: Self-pay | Admitting: Internal Medicine

## 2016-12-17 VITALS — BP 132/80 | HR 70 | Temp 98.8°F | Wt 166.4 lb

## 2016-12-17 DIAGNOSIS — I480 Paroxysmal atrial fibrillation: Secondary | ICD-10-CM | POA: Diagnosis not present

## 2016-12-17 DIAGNOSIS — I10 Essential (primary) hypertension: Secondary | ICD-10-CM | POA: Diagnosis not present

## 2016-12-17 DIAGNOSIS — E119 Type 2 diabetes mellitus without complications: Secondary | ICD-10-CM

## 2016-12-17 LAB — HEMOGLOBIN A1C: Hgb A1c MFr Bld: 6.2 % (ref 4.6–6.5)

## 2016-12-17 NOTE — Patient Instructions (Addendum)
WE NOW OFFER   [name]'s FAST TRACK!!!  SAME DAY Appointments for ACUTE CARE  Such as: Sprains, Injuries, cuts, abrasions, rashes, muscle pain, joint pain, back pain Colds, flu, sore throats, headache, allergies, cough, fever  Ear pain, sinus and eye infections Abdominal pain, nausea, vomiting, diarrhea, upset stomach Animal/insect bites  3 Easy Ways to Schedule: Walk-In Scheduling Call in scheduling Mychart Sign-up: [email or web address]   Limit your sodium (Salt) intake   Please check your hemoglobin A1c every 6  Months  Please check your blood pressure on a regular basis.  If it is consistently greater than 150/90, please make an office appointment.

## 2016-12-17 NOTE — Progress Notes (Signed)
Pre visit review using our clinic review tool, if applicable. No additional management support is needed unless otherwise documented below in the visit note. 

## 2016-12-17 NOTE — Progress Notes (Signed)
Subjective:    Patient ID: [name], female    DOB: 06-19-1923, 83 y.o.   MRN: [id number]  HPI  83 year old patient who is seen today in follow-up. Medical problems include essential hypertension.  She has paroxysmal atrial fibrillation as well as diet-controlled diabetes. There is been some weight gain over the past 6 months.  She has dyslipidemia and remains on statin therapy. She has a history of anxiety, depression, which has been stable.  She has arthritis and occasional back pain. No cardiopulmonary complaints No recent ED visits. Past Medical History:  Diagnosis Date  . ANEMIA, DEFICIENCY, HX OF 03/01/2010  . Anxiety   . DEPRESSIVE DISORDER 09/19/2010  . DM w/o Complication Type II 05/18/2007  . GERD 05/18/2007  . Glaucoma   . HYPERLIPIDEMIA 03/17/2008  . HYPERTENSION 05/18/2007  . INSOMNIA 11/23/2008  . Kidney stone   . Loss of weight 05/30/2010     Social History   Social History  . Marital status: Widowed    Spouse name: N/A  . Number of children: 3  . Years of education: N/A   Occupational History  . retired    Social History Main Topics  . Smoking status: Never Smoker  . Smokeless tobacco: Never Used  . Alcohol use No  . Drug use: No  . Sexual activity: Not on file   Other Topics Concern  . Not on file   Social History Narrative  . No narrative on file    Past Surgical History:  Procedure Laterality Date  . ABDOMINAL HYSTERECTOMY    . APPENDECTOMY    . CATARACT EXTRACTION    . COLONOSCOPY  06-28-10   per Dr. Arlyce Dice, diverticulosis only   . DILATION AND CURETTAGE OF UTERUS    . ESOPHAGOGASTRODUODENOSCOPY  05-13-11   per Dr. Arlyce Dice, normal   . HEMORRHOID SURGERY    . PACEMAKER INSERTION  09/2011  . PERMANENT PACEMAKER INSERTION N/A 09/30/2011   Procedure: PERMANENT PACEMAKER INSERTION;  Surgeon: Marinus Maw, MD;  Location: [facility] CATH LAB;  Service: Cardiovascular;  Laterality: N/A;    Family History  Problem Relation Age of Onset  . Brain  cancer Father   . Colon cancer Sister   . Colitis Brother   . Heart attack Brother     Allergies  Allergen Reactions  . Amoxicillin Rash  . Penicillins Rash    Has patient had a PCN reaction causing immediate rash, facial/tongue/throat swelling, SOB or lightheadedness with hypotension: unknown Has patient had a PCN reaction causing severe rash involving mucus membranes or skin necrosis: unkonwn Has patient had a PCN reaction that required hospitalization unknown Has patient had a PCN reaction occurring within the last 10 years: No If all of the above ansers are "NO", then may proceed with Cephalosporin use.     Current Outpatient Prescriptions on File Prior to Visit  Medication Sig Dispense Refill  . acetaminophen (TYLENOL) 500 MG tablet Take 1,000 mg by mouth every 6 (six) hours as needed for moderate pain (pain). For pain    . famotidine (PEPCID) 20 MG tablet TAKE ONE TABLET BY MOUTH TWICE DAILY 180 tablet 1  . ferrous sulfate 325 (65 FE) MG tablet Take 1 tablet (325 mg total) by mouth daily with breakfast.  3  . furosemide (LASIX) 20 MG tablet TAKE ONE TABLET BY MOUTH ONCE DAILY 90 tablet 0  . lisinopril (PRINIVIL,ZESTRIL) 5 MG tablet TAKE TWO TABLETS BY MOUTH ONCE DAILY 180 tablet 1  . LORazepam (ATIVAN) 0.5  MG tablet TAKE ONE TABLET BY MOUTH THREE TIMES DAILY AS NEEDED 90 tablet 2  . Multiple Vitamin (MULTIVITAMIN WITH MINERALS) TABS tablet Take 1 tablet by mouth daily.    . sertraline (ZOLOFT) 50 MG tablet Take 1 tablet (50 mg total) by mouth daily. 90 tablet 1  . simvastatin (ZOCOR) 40 MG tablet TAKE ONE TABLET BY MOUTH AT BEDTIME 90 tablet 1  . traMADol (ULTRAM) 50 MG tablet TAKE ONE TABLET BY MOUTH EVERY 8 HOURS AS NEEDED FOR PAIN 90 tablet 3   No current facility-administered medications on file prior to visit.     BP 132/80 (BP Location: Left Arm, Patient Position: Sitting, Cuff Size: Normal)   Pulse 70   Temp 98.8 F (37.1 C) (Oral)   Wt 166 lb 6.4 oz (75.5 kg)    SpO2 97%   BMI 29.48 kg/m     Review of Systems  Constitutional: Negative.   HENT: Negative for congestion, dental problem, hearing loss, rhinorrhea, sinus pressure, sore throat and tinnitus.   Eyes: Negative for pain, discharge and visual disturbance.  Respiratory: Negative for cough and shortness of breath.   Cardiovascular: Negative for chest pain, palpitations and leg swelling.  Gastrointestinal: Negative for abdominal distention, abdominal pain, blood in stool, constipation, diarrhea, nausea and vomiting.  Genitourinary: Negative for difficulty urinating, dysuria, flank pain, frequency, hematuria, pelvic pain, urgency, vaginal bleeding, vaginal discharge and vaginal pain.  Musculoskeletal: Negative for arthralgias, gait problem and joint swelling.  Skin: Negative for rash.  Neurological: Negative for dizziness, syncope, speech difficulty, weakness, numbness and headaches.  Hematological: Negative for adenopathy.  Psychiatric/Behavioral: Positive for decreased concentration. Negative for agitation, behavioral problems and dysphoric mood. The patient is nervous/anxious.        Objective:   Physical Exam  Constitutional: She is oriented to person, place, and time. She appears well-developed and well-nourished.  Blood pressure normal Confusion Does not recognize me as her long time physician  HENT:  Head: Normocephalic.  Right Ear: External ear normal.  Left Ear: External ear normal.  Mouth/Throat: Oropharynx is clear and moist.  Eyes: Conjunctivae and EOM are normal. Pupils are equal, round, and reactive to light.  Neck: Normal range of motion. Neck supple. No thyromegaly present.  Cardiovascular: Normal rate, regular rhythm, normal heart sounds and intact distal pulses.   Pulmonary/Chest: Effort normal and breath sounds normal.  Abdominal: Soft. Bowel sounds are normal. She exhibits no mass. There is no tenderness.  Musculoskeletal: Normal range of motion.  Lymphadenopathy:      She has no cervical adenopathy.  Neurological: She is alert and oriented to person, place, and time.  Skin: Skin is warm and dry. No rash noted.  Psychiatric: She has a normal mood and affect. Her behavior is normal.          Assessment & Plan:   Essential hypertension, well-controlled Dementia Anxiety, depression, stable Dyslipidemia.  Continue statin therapy Diet-controlled diabetes.  Will check hemoglobin A1c  Follow-up 6 months No change in medical therapy  Rogelia Boga

## 2017-02-05 ENCOUNTER — Other Ambulatory Visit: Payer: Self-pay | Admitting: Internal Medicine

## 2017-02-11 ENCOUNTER — Ambulatory Visit (INDEPENDENT_AMBULATORY_CARE_PROVIDER_SITE_OTHER): Payer: PPO | Admitting: *Deleted

## 2017-02-11 DIAGNOSIS — R001 Bradycardia, unspecified: Secondary | ICD-10-CM | POA: Diagnosis not present

## 2017-02-11 NOTE — Progress Notes (Signed)
Remote pacemaker transmission.   

## 2017-02-12 ENCOUNTER — Encounter: Payer: Self-pay | Admitting: Cardiology

## 2017-02-12 LAB — CUP PACEART REMOTE DEVICE CHECK
Battery Remaining Longevity: 72 mo
Battery Remaining Percentage: 100 %
Brady Statistic RV Percent Paced: 0 %
Date Time Interrogation Session: 20180612041100
Implantable Lead Implant Date: 20130128
Implantable Lead Location: 753859
Implantable Lead Location: 753860
Implantable Lead Model: 4135
Implantable Lead Serial Number: 29117362
Implantable Lead Serial Number: 29118103
Implantable Pulse Generator Implant Date: 20130128
Lead Channel Pacing Threshold Pulse Width: 0.4 ms
Lead Channel Setting Pacing Amplitude: 2 V
Lead Channel Setting Pacing Amplitude: 2.4 V
Lead Channel Setting Pacing Pulse Width: 0.4 ms
Lead Channel Setting Sensing Sensitivity: 2.5 mV
MDC IDC LEAD IMPLANT DT: 20130128
MDC IDC MSMT LEADCHNL RA IMPEDANCE VALUE: 535 Ohm
MDC IDC MSMT LEADCHNL RA PACING THRESHOLD AMPLITUDE: 0.5 V
MDC IDC MSMT LEADCHNL RV IMPEDANCE VALUE: 762 Ohm
MDC IDC PG SERIAL: 123684
MDC IDC STAT BRADY RA PERCENT PACED: 39 %

## 2017-02-15 ENCOUNTER — Other Ambulatory Visit: Payer: Self-pay | Admitting: Internal Medicine

## 2017-03-19 ENCOUNTER — Other Ambulatory Visit: Payer: Self-pay | Admitting: Internal Medicine

## 2017-03-24 NOTE — Telephone Encounter (Signed)
Pts son calling to check the status of Rx's.  Pt is out of medication.  [facility] [address]

## 2017-04-02 ENCOUNTER — Other Ambulatory Visit: Payer: Self-pay | Admitting: Internal Medicine

## 2017-04-25 ENCOUNTER — Other Ambulatory Visit: Payer: Self-pay | Admitting: Internal Medicine

## 2017-05-01 ENCOUNTER — Other Ambulatory Visit: Payer: Self-pay | Admitting: Internal Medicine

## 2017-05-07 ENCOUNTER — Ambulatory Visit: Payer: PPO | Admitting: Family Medicine

## 2017-05-07 ENCOUNTER — Telehealth: Payer: Self-pay | Admitting: Internal Medicine

## 2017-05-07 NOTE — Telephone Encounter (Signed)
Pts son is calling to see if you could please give him a call discuss the pts nosebleed.

## 2017-05-07 NOTE — Telephone Encounter (Signed)
Spoke with pt's son, Gerlene BurdockRichard. He states that pt has now refused to come to office to be seen. He reports that she is getting progressively worse with what he thinks may be dementia. She is often very agitated and most days does not even know who he is. She asks when he will be home and does not recognize that he is there. She does not like to talk to him because she does not seem to know/understand who he is. He is very concerned about her but does not know how to get her out of the house to come to the office. I suggested he make a calendar and note the date of the OV and then mark off each day as it gets closer, and to remind her daily that she has an OV coming up in "x" number of days. He will try this. She is scheduled for mid-October however I advised son that she needs to be seen and evaluated sooner. He will call back to schedule for a sooner appt. Nothing further needed at this time.  Dr. Kirtland BouchardK - Lorain ChildesFYI

## 2017-05-13 ENCOUNTER — Telehealth: Payer: Self-pay | Admitting: Cardiology

## 2017-05-13 ENCOUNTER — Ambulatory Visit (INDEPENDENT_AMBULATORY_CARE_PROVIDER_SITE_OTHER): Payer: PPO | Admitting: *Deleted

## 2017-05-13 DIAGNOSIS — R001 Bradycardia, unspecified: Secondary | ICD-10-CM

## 2017-05-13 NOTE — Telephone Encounter (Signed)
Confirmed remote transmission w/ pt son.    

## 2017-05-13 NOTE — Progress Notes (Signed)
Remote pacemaker transmission.   

## 2017-05-14 ENCOUNTER — Encounter: Payer: Self-pay | Admitting: Cardiology

## 2017-05-14 LAB — CUP PACEART REMOTE DEVICE CHECK
Battery Remaining Percentage: 100 %
Brady Statistic RA Percent Paced: 40 %
Date Time Interrogation Session: 20180911144100
Implantable Lead Implant Date: 20130128
Implantable Lead Location: 753859
Implantable Lead Location: 753860
Implantable Lead Model: 4135
Implantable Lead Serial Number: 29118103
Implantable Pulse Generator Implant Date: 20130128
Lead Channel Impedance Value: 514 Ohm
Lead Channel Impedance Value: 756 Ohm
Lead Channel Setting Pacing Amplitude: 2 V
Lead Channel Setting Pacing Amplitude: 2.4 V
MDC IDC LEAD IMPLANT DT: 20130128
MDC IDC LEAD SERIAL: 29117362
MDC IDC MSMT BATTERY REMAINING LONGEVITY: 72 mo
MDC IDC MSMT LEADCHNL RA PACING THRESHOLD AMPLITUDE: 0.5 V
MDC IDC MSMT LEADCHNL RA PACING THRESHOLD PULSEWIDTH: 0.4 ms
MDC IDC SET LEADCHNL RV PACING PULSEWIDTH: 0.4 ms
MDC IDC SET LEADCHNL RV SENSING SENSITIVITY: 2.5 mV
MDC IDC STAT BRADY RV PERCENT PACED: 0 %
Pulse Gen Serial Number: 123684

## 2017-05-26 ENCOUNTER — Other Ambulatory Visit: Payer: Self-pay | Admitting: Internal Medicine

## 2017-05-26 NOTE — Telephone Encounter (Signed)
Pt request refill  lisinopril (PRINIVIL,ZESTRIL) 5 MG tablet LORazepam (ATIVAN) 0.5 MG tablet  Pt following up because pt is out.  [facility] - [place], [place] - [address]

## 2017-05-29 ENCOUNTER — Telehealth: Payer: Self-pay | Admitting: Internal Medicine

## 2017-05-29 NOTE — Telephone Encounter (Signed)
° ° ° °  Son call to say pt need her water pill, did not know the name of the med     [facility]

## 2017-05-30 NOTE — Telephone Encounter (Signed)
furosemide (LASIX) 20 MG tablet

## 2017-06-05 ENCOUNTER — Other Ambulatory Visit: Payer: Self-pay | Admitting: Internal Medicine

## 2017-06-17 ENCOUNTER — Encounter: Payer: Self-pay | Admitting: Internal Medicine

## 2017-06-17 ENCOUNTER — Ambulatory Visit (INDEPENDENT_AMBULATORY_CARE_PROVIDER_SITE_OTHER): Payer: PPO | Admitting: Internal Medicine

## 2017-06-17 VITALS — BP 158/76 | HR 68 | Temp 98.6°F | Ht 63.0 in | Wt 160.0 lb

## 2017-06-17 DIAGNOSIS — Z862 Personal history of diseases of the blood and blood-forming organs and certain disorders involving the immune mechanism: Secondary | ICD-10-CM

## 2017-06-17 DIAGNOSIS — E785 Hyperlipidemia, unspecified: Secondary | ICD-10-CM

## 2017-06-17 DIAGNOSIS — E119 Type 2 diabetes mellitus without complications: Secondary | ICD-10-CM

## 2017-06-17 DIAGNOSIS — I10 Essential (primary) hypertension: Secondary | ICD-10-CM | POA: Diagnosis not present

## 2017-06-17 LAB — COMPREHENSIVE METABOLIC PANEL
ALBUMIN: 4.1 g/dL (ref 3.5–5.2)
ALK PHOS: 46 U/L (ref 39–117)
ALT: 7 U/L (ref 0–35)
AST: 10 U/L (ref 0–37)
BILIRUBIN TOTAL: 0.6 mg/dL (ref 0.2–1.2)
BUN: 18 mg/dL (ref 6–23)
CALCIUM: 9.8 mg/dL (ref 8.4–10.5)
CO2: 31 mEq/L (ref 19–32)
Chloride: 102 mEq/L (ref 96–112)
Creatinine, Ser: 1.08 mg/dL (ref 0.40–1.20)
GFR: 50.41 mL/min — AB (ref >60.00)
Glucose, Bld: 133 mg/dL — ABNORMAL HIGH (ref 70–99)
POTASSIUM: 4 meq/L (ref 3.5–5.1)
Sodium: 142 mEq/L (ref 135–145)
TOTAL PROTEIN: 7.1 g/dL (ref 6.0–8.3)

## 2017-06-17 LAB — CBC WITH DIFFERENTIAL/PLATELET
Basophils Absolute: 0 10*3/uL (ref 0.0–0.1)
Basophils Relative: 0.3 % (ref 0.0–3.0)
EOS ABS: 0 10*3/uL (ref 0.0–0.7)
Eosinophils Relative: 0.1 % (ref 0.0–5.0)
HEMATOCRIT: 41.9 % (ref 36.0–46.0)
Hemoglobin: 13.3 g/dL (ref 12.0–15.0)
LYMPHS ABS: 1.6 10*3/uL (ref 0.7–4.0)
LYMPHS PCT: 13.2 % (ref 12.0–46.0)
MCHC: 31.7 g/dL (ref 30.0–36.0)
MCV: 91.9 fl (ref 78.0–100.0)
MONOS PCT: 4.4 % (ref 3.0–12.0)
Monocytes Absolute: 0.5 10*3/uL (ref 0.1–1.0)
NEUTROS ABS: 9.7 10*3/uL — AB (ref 1.4–7.7)
Neutrophils Relative %: 82 % — ABNORMAL HIGH (ref 43.0–77.0)
PLATELETS: 253 10*3/uL (ref 150.0–400.0)
RBC: 4.56 Mil/uL (ref 3.87–5.11)
RDW: 13.3 % (ref 11.5–15.5)
WBC: 11.8 10*3/uL — ABNORMAL HIGH (ref 4.0–10.5)

## 2017-06-17 LAB — LIPID PANEL
CHOLESTEROL: 208 mg/dL — AB (ref 0–200)
HDL: 63.2 mg/dL (ref >39.00)
LDL Cholesterol: 111 mg/dL — ABNORMAL HIGH (ref 0–99)
NONHDL: 144.81
Total CHOL/HDL Ratio: 3
Triglycerides: 170 mg/dL — ABNORMAL HIGH (ref 0.0–149.0)
VLDL: 34 mg/dL (ref 0.0–40.0)

## 2017-06-17 LAB — HEMOGLOBIN A1C: HEMOGLOBIN A1C: 6.2 % (ref 4.6–6.5)

## 2017-06-17 LAB — TSH: TSH: 2.69 u[IU]/mL (ref 0.35–4.50)

## 2017-06-17 MED ORDER — FUROSEMIDE 20 MG PO TABS: 20.0000 mg | ORAL_TABLET | Freq: Every day | ORAL | 0 refills | Status: DC

## 2017-06-17 MED ORDER — LISINOPRIL 10 MG PO TABS: 10.0000 mg | ORAL_TABLET | Freq: Every day | ORAL | 3 refills | Status: DC

## 2017-06-17 NOTE — Progress Notes (Signed)
Subjective:    Patient ID: [name], female    DOB: Aug 25, 1923, 83 y.o.   MRN: [id number]  HPI 83 year old patient with type 2 diabetes who is seen today for her six-month follow-up. She has essential hypertension and dyslipidemia  Lab Results  Component Value Date   HGBA1C 6.2 12/17/2016   She has advanced dementia and is accompanied by her son.  Wt Readings from Last 3 Encounters:  06/17/17 160 lb (72.6 kg)  12/17/16 166 lb 6.4 oz (75.5 kg)  08/13/16 162 lb (73.5 kg)    BP Readings from Last 3 Encounters:  06/17/17 (!) 158/76  12/17/16 132/80  08/13/16 (!) 162/62   She seems be doing fairly well.  Past Medical History:  Diagnosis Date  . ANEMIA, DEFICIENCY, HX OF 03/01/2010  . Anxiety   . DEPRESSIVE DISORDER 09/19/2010  . DM w/o Complication Type II 05/18/2007  . GERD 05/18/2007  . Glaucoma   . HYPERLIPIDEMIA 03/17/2008  . HYPERTENSION 05/18/2007  . INSOMNIA 11/23/2008  . Kidney stone   . Loss of weight 05/30/2010     Social History   Social History  . Marital status: Widowed    Spouse name: N/A  . Number of children: 3  . Years of education: N/A   Occupational History  . retired    Social History Main Topics  . Smoking status: Never Smoker  . Smokeless tobacco: Never Used  . Alcohol use No  . Drug use: No  . Sexual activity: Not on file   Other Topics Concern  . Not on file   Social History Narrative  . No narrative on file    Past Surgical History:  Procedure Laterality Date  . ABDOMINAL HYSTERECTOMY    . APPENDECTOMY    . CATARACT EXTRACTION    . COLONOSCOPY  06-28-10   per Dr. Arlyce Dice, diverticulosis only   . DILATION AND CURETTAGE OF UTERUS    . ESOPHAGOGASTRODUODENOSCOPY  05-13-11   per Dr. Arlyce Dice, normal   . HEMORRHOID SURGERY    . PACEMAKER INSERTION  09/2011  . PERMANENT PACEMAKER INSERTION N/A 09/30/2011   Procedure: PERMANENT PACEMAKER INSERTION;  Surgeon: Marinus Maw, MD;  Location: [facility] CATH LAB;  Service: Cardiovascular;   Laterality: N/A;    Family History  Problem Relation Age of Onset  . Brain cancer Father   . Colon cancer Sister   . Colitis Brother   . Heart attack Brother     Allergies  Allergen Reactions  . Amoxicillin Rash  . Penicillins Rash    Has patient had a PCN reaction causing immediate rash, facial/tongue/throat swelling, SOB or lightheadedness with hypotension: unknown Has patient had a PCN reaction causing severe rash involving mucus membranes or skin necrosis: unkonwn Has patient had a PCN reaction that required hospitalization unknown Has patient had a PCN reaction occurring within the last 10 years: No If all of the above ansers are "NO", then may proceed with Cephalosporin use.     Current Outpatient Prescriptions on File Prior to Visit  Medication Sig Dispense Refill  . acetaminophen (TYLENOL) 500 MG tablet Take 1,000 mg by mouth every 6 (six) hours as needed for moderate pain (pain). For pain    . famotidine (PEPCID) 20 MG tablet TAKE ONE TABLET BY MOUTH TWICE DAILY 180 tablet 1  . ferrous sulfate 325 (65 FE) MG tablet Take 1 tablet (325 mg total) by mouth daily with breakfast.  3  . furosemide (LASIX) 20 MG tablet TAKE 1 TABLET  BY MOUTH ONCE DAILY 90 tablet 0  . lisinopril (PRINIVIL,ZESTRIL) 5 MG tablet TAKE 2 TABLETS BY MOUTH ONCE DAILY 180 tablet 1  . LORazepam (ATIVAN) 0.5 MG tablet TAKE 1 TABLET BY MOUTH THREE TIMES DAILY AS NEEDED 90 tablet 0  . Multiple Vitamin (MULTIVITAMIN WITH MINERALS) TABS tablet Take 1 tablet by mouth daily.    . sertraline (ZOLOFT) 50 MG tablet Take 1 tablet (50 mg total) by mouth daily. 90 tablet 1  . simvastatin (ZOCOR) 40 MG tablet TAKE ONE TABLET BY MOUTH ONCE DAILY AT BEDTIME 90 tablet 1  . traMADol (ULTRAM) 50 MG tablet TAKE 1 TABLET BY MOUTH EVERY 8 HOURS AS NEEDED FOR PAIN 90 tablet 0   No current facility-administered medications on file prior to visit.     BP (!) 158/76 (BP Location: Left Arm, Patient Position: Sitting, Cuff Size:  Normal)   Pulse 68   Temp 98.6 F (37 C) (Oral)   Ht  (1.6 m)   Wt 160 lb (72.6 kg)   SpO2 98%   BMI 28.34 kg/m      Review of Systems  Constitutional: Positive for unexpected weight change.  HENT: Negative for congestion, dental problem, hearing loss, rhinorrhea, sinus pressure, sore throat and tinnitus.   Eyes: Negative for pain, discharge and visual disturbance.  Respiratory: Negative for cough and shortness of breath.   Cardiovascular: Negative for chest pain, palpitations and leg swelling.  Gastrointestinal: Negative for abdominal distention, abdominal pain, blood in stool, constipation, diarrhea, nausea and vomiting.  Genitourinary: Negative for difficulty urinating, dysuria, flank pain, frequency, hematuria, pelvic pain, urgency, vaginal bleeding, vaginal discharge and vaginal pain.  Musculoskeletal: Negative for arthralgias, gait problem and joint swelling.  Skin: Negative for rash.  Neurological: Negative for dizziness, syncope, speech difficulty, weakness, numbness and headaches.  Hematological: Negative for adenopathy.  Psychiatric/Behavioral: Positive for confusion and decreased concentration. Negative for agitation, behavioral problems and dysphoric mood. The patient is nervous/anxious.        Objective:   Physical Exam  Constitutional: She is oriented to person, place, and time. She appears well-developed and well-nourished.  Blood pressure 130/82  HENT:  Head: Normocephalic.  Right Ear: External ear normal.  Left Ear: External ear normal.  Mouth/Throat: Oropharynx is clear and moist.  Eyes: Pupils are equal, round, and reactive to light. Conjunctivae and EOM are normal.  Neck: Normal range of motion. Neck supple. No thyromegaly present.  Cardiovascular: Normal rate, regular rhythm and normal heart sounds.   Pulmonary/Chest: Effort normal and breath sounds normal.  Abdominal: Soft. Bowel sounds are normal. She exhibits no mass. There is no tenderness.    Musculoskeletal: Normal range of motion.  Lymphadenopathy:    She has no cervical adenopathy.  Neurological: She is alert and oriented to person, place, and time.  Skin: Skin is warm and dry. No rash noted.  Psychiatric: She has a normal mood and affect. Her behavior is normal.  Severe dementia          Assessment & Plan:   Diabetes mellitus.  Will check a hemoglobin A1c Dyslipidemia.  We'll review a lipid profile Essential hypertension, stable.  No change in therapy Advanced dementia  Follow-up 6 months No change in medical regimen Review lab Flu vaccine administered after drugstore earlier this month  Rogelia Boga

## 2017-06-17 NOTE — Patient Instructions (Signed)
Limit your sodium (Salt) intake    It is important that you exercise regularly,  3 to 4 times per week.  If you develop chest pain or shortness of breath seek  medical attention.   Please check your hemoglobin A1c every 6 months

## 2017-06-18 ENCOUNTER — Telehealth: Payer: Self-pay | Admitting: Internal Medicine

## 2017-06-18 DIAGNOSIS — R2681 Unsteadiness on feet: Secondary | ICD-10-CM

## 2017-06-18 NOTE — Telephone Encounter (Signed)
Dr. Leonard SchwartzK,  Okay to place a referral for home health for patient?

## 2017-06-18 NOTE — Telephone Encounter (Signed)
[name]'s son had reached out to his dr, [facility] to ask for home health nursing for his mother, the [name]. They suggested it would need to come from [name]'s PCP, so they have reached out to us to ask if we could do a referral for home health nursing evaluation..Marland Kitchen

## 2017-06-19 NOTE — Telephone Encounter (Signed)
Yes.  Please place order

## 2017-06-20 NOTE — Telephone Encounter (Signed)
Referral placed.

## 2017-06-30 ENCOUNTER — Other Ambulatory Visit: Payer: Self-pay | Admitting: Internal Medicine

## 2017-06-30 DIAGNOSIS — R2681 Unsteadiness on feet: Secondary | ICD-10-CM | POA: Diagnosis not present

## 2017-06-30 DIAGNOSIS — R2689 Other abnormalities of gait and mobility: Secondary | ICD-10-CM | POA: Diagnosis not present

## 2017-07-03 ENCOUNTER — Telehealth: Payer: Self-pay | Admitting: Internal Medicine

## 2017-07-03 NOTE — Telephone Encounter (Signed)
° ° ° °  Brandi with [facility] call to 2 times a week for 3 weeks   OT eval and social worker eval   [phone]

## 2017-07-04 DIAGNOSIS — F419 Anxiety disorder, unspecified: Secondary | ICD-10-CM | POA: Diagnosis not present

## 2017-07-04 DIAGNOSIS — Z950 Presence of cardiac pacemaker: Secondary | ICD-10-CM | POA: Diagnosis not present

## 2017-07-04 DIAGNOSIS — I4891 Unspecified atrial fibrillation: Secondary | ICD-10-CM | POA: Diagnosis not present

## 2017-07-04 DIAGNOSIS — F0390 Unspecified dementia without behavioral disturbance: Secondary | ICD-10-CM | POA: Diagnosis not present

## 2017-07-04 DIAGNOSIS — F329 Major depressive disorder, single episode, unspecified: Secondary | ICD-10-CM | POA: Diagnosis not present

## 2017-07-04 DIAGNOSIS — Z862 Personal history of diseases of the blood and blood-forming organs and certain disorders involving the immune mechanism: Secondary | ICD-10-CM | POA: Diagnosis not present

## 2017-07-04 DIAGNOSIS — R2681 Unsteadiness on feet: Secondary | ICD-10-CM | POA: Diagnosis not present

## 2017-07-04 DIAGNOSIS — I10 Essential (primary) hypertension: Secondary | ICD-10-CM | POA: Diagnosis not present

## 2017-07-04 DIAGNOSIS — E119 Type 2 diabetes mellitus without complications: Secondary | ICD-10-CM | POA: Diagnosis not present

## 2017-07-04 NOTE — Telephone Encounter (Signed)
Verbal orders were given to Brandi per Dr k. 

## 2017-07-08 ENCOUNTER — Telehealth: Payer: Self-pay | Admitting: Internal Medicine

## 2017-07-08 NOTE — Telephone Encounter (Addendum)
Marcelino DusterMichelle with [facility] would like dr k to know there has been a  delay with pt getting home health OT eval do to the request of the pt's family   Nothing further needed

## 2017-07-08 NOTE — Telephone Encounter (Signed)
Please note

## 2017-07-10 ENCOUNTER — Telehealth: Payer: Self-pay

## 2017-07-10 NOTE — Telephone Encounter (Signed)
Michelle @ [facility] called to advise that she did pt's OT evaluation and recommended services to the pt and her family. They have declined OT services at this time.  Dr. Kirtland BouchardK - Lorain ChildesFYI. Thanks!

## 2017-07-11 ENCOUNTER — Telehealth: Payer: Self-pay | Admitting: Internal Medicine

## 2017-07-11 NOTE — Telephone Encounter (Signed)
° °  Medical Social Worker did a eval on pt and is asking for verbal orders    [phone]   1 TIME A WEEK FOR 1 WEEK

## 2017-07-11 NOTE — Telephone Encounter (Signed)
Spoke with Annice PihJackie and verbal orders were given for social worker to return to p's home 1 time next week per Dr Kirtland BouchardK.

## 2017-07-20 ENCOUNTER — Other Ambulatory Visit: Payer: Self-pay | Admitting: Internal Medicine

## 2017-07-23 ENCOUNTER — Telehealth: Payer: Self-pay | Admitting: Internal Medicine

## 2017-07-23 ENCOUNTER — Telehealth: Payer: Self-pay | Admitting: Family Medicine

## 2017-07-23 NOTE — Telephone Encounter (Signed)
[name] came in today needing a DNR for [employer].   Please Advise

## 2017-07-23 NOTE — Telephone Encounter (Signed)
Copied from CRM [phone]#[id number]. Topic: Inquiry >> Jul 23, 2017  8:12 AM Landry MellowFoltz, Melissa J wrote: Reason for CRM: son called he needs to get orange form for the DNR form. He will come by today with the pt's living will.  Cb number is [phone]. Leave message and he will call back

## 2017-07-28 NOTE — Telephone Encounter (Signed)
Ok to sign.

## 2017-07-29 NOTE — Telephone Encounter (Signed)
Spoke with [name] to inform that DNR form is ready to be picked up. [name] requested form to be mailed to:  [address]  [place] [id number]  Form was mailed to above address.

## 2017-08-03 ENCOUNTER — Other Ambulatory Visit: Payer: Self-pay | Admitting: Internal Medicine

## 2017-08-07 ENCOUNTER — Encounter: Payer: Self-pay | Admitting: *Deleted

## 2017-08-11 NOTE — Progress Notes (Deleted)
Cardiology Office Note Date:  08/11/2017  Patient ID:  [name], DOB [birth year], MRN [id number] PCP:  Gordy SaversKwiatkowski, Peter F, MD  Cardiologist/Electrophysiologist: Dr. Ladona Ridgelaylor  ***refresh   Chief Complaint: ***  History of Present Illness: [name] is a 83 y.o. female with history of HTN, symptomatic bradycardia w/PPM, DM, HLD, PAFlutter has been observed on her device, she has been felt to be a poor candidate for a/c 2/2 generalized weakness and propensity for alls,  comes in today to be seen for Dr. Ladona Ridgelaylor, last seen by him Dec 2017, noted her dementia was progressive and limiting to her, BP was elevated but felt more aggressive management was not appropriate for the patient, no changes were made.  *** symptoms *** meds *** labs *** dementia, falls, care  Device information: BSCi dual chamber PPM, implanted 09/30/11, Dr. Ladona Ridgelaylor   Past Medical History:  Diagnosis Date  . ANEMIA, DEFICIENCY, HX OF 03/01/2010  . Anxiety   . DEPRESSIVE DISORDER 09/19/2010  . DM w/o Complication Type II 05/18/2007  . GERD 05/18/2007  . Glaucoma   . HYPERLIPIDEMIA 03/17/2008  . HYPERTENSION 05/18/2007  . INSOMNIA 11/23/2008  . Kidney stone   . Loss of weight 05/30/2010    Past Surgical History:  Procedure Laterality Date  . ABDOMINAL HYSTERECTOMY    . APPENDECTOMY    . CATARACT EXTRACTION    . COLONOSCOPY  06-28-10   per Dr. Arlyce DiceKaplan, diverticulosis only   . DILATION AND CURETTAGE OF UTERUS    . ESOPHAGOGASTRODUODENOSCOPY  05-13-11   per Dr. Arlyce DiceKaplan, normal   . HEMORRHOID SURGERY    . PACEMAKER INSERTION  09/2011  . PERMANENT PACEMAKER INSERTION N/A 09/30/2011   Procedure: PERMANENT PACEMAKER INSERTION;  Surgeon: Marinus MawGregg W Taylor, MD;  [facility];  Service: Cardiovascular;  Laterality: N/A;    Current Outpatient Medications  Medication Sig Dispense Refill  . acetaminophen (TYLENOL) 500 MG tablet Take 1,000 mg by mouth every 6 (six) hours as needed for moderate pain (pain).  For pain    . famotidine (PEPCID) 20 MG tablet TAKE ONE TABLET BY MOUTH TWICE DAILY 180 tablet 1  . ferrous sulfate 325 (65 FE) MG tablet Take 1 tablet (325 mg total) by mouth daily with breakfast.  3  . furosemide (LASIX) 20 MG tablet Take 1 tablet (20 mg total) by mouth daily. 90 tablet 0  . lisinopril (PRINIVIL,ZESTRIL) 10 MG tablet Take 1 tablet (10 mg total) by mouth daily. 90 tablet 3  . LORazepam (ATIVAN) 0.5 MG tablet TAKE 1 TABLET BY MOUTH THREE TIMES DAILY 90 tablet 0  . Multiple Vitamin (MULTIVITAMIN WITH MINERALS) TABS tablet Take 1 tablet by mouth daily.    . sertraline (ZOLOFT) 50 MG tablet Take 1 tablet (50 mg total) by mouth daily. 90 tablet 1  . simvastatin (ZOCOR) 40 MG tablet TAKE ONE TABLET BY MOUTH ONCE DAILY AT BEDTIME 90 tablet 1  . traMADol (ULTRAM) 50 MG tablet TAKE 1 TABLET BY MOUTH EVERY 8 HOURS AS NEEDED FOR PAIN 90 tablet 0   No current facility-administered medications for this visit.     Allergies:   Amoxicillin and Penicillins   Social History:  The patient  reports that  has never smoked. she has never used smokeless tobacco. She reports that she does not drink alcohol or use drugs.   Family History:  The patient's family history includes Brain cancer in her father; Colitis in her brother; Colon cancer in her sister; Heart attack in  her brother; Unexplained death in her mother.  ROS:  Please see the history of present illness.    All other systems are reviewed and otherwise negative.   PHYSICAL EXAM: *** VS:  There were no vitals taken for this visit. BMI: There is no height or weight on file to calculate BMI. Well nourished, well developed, in no acute distress  HEENT: normocephalic, atraumatic  Neck: no JVD, carotid bruits or masses Cardiac:  ***  RRR; no significant murmurs, no rubs, or gallops Lungs:  *** CTA b/l, no wheezing, rhonchi or rales  Abd: soft, nontender MS: no deformity or *** atrophy Ext: ***  no edema  Skin: warm and dry, no  rash Neuro:  No gross deficits appreciated Psych: euthymic mood, full affect  *** PPM site is stable, no tethering or discomfort   EKG:  Done today shows *** PPM interrogation done today and reviewed by myself: ***  09/30/11: TTE Study Conclusions - Left ventricle: The cavity size was normal. Wall thickness was normal. The estimated ejection fraction was 55%. Regional wall motion abnormalities cannot be excluded. - Aortic valve: Mild regurgitation. - Mitral valve: Mild regurgitation.   Recent Labs: 06/17/2017: ALT 7; BUN 18; Creatinine, Ser 1.08; Hemoglobin 13.3; Platelets 253.0; Potassium 4.0; Sodium 142; TSH 2.69  06/17/2017: Cholesterol 208; HDL 63.20; LDL Cholesterol 111; Total CHOL/HDL Ratio 3; Triglycerides 170.0; VLDL 34.0   CrCl cannot be calculated (Patient's most recent lab result is older than the maximum 21 days allowed.).   Wt Readings from Last 3 Encounters:  06/17/17 160 lb (72.6 kg)  12/17/16 166 lb 6.4 oz (75.5 kg)  08/13/16 162 lb (73.5 kg)     Other studies reviewed: Additional studies/records reviewed today include: summarized above  ASSESSMENT AND PLAN:  1. PPM     ***  2. PAFlutter *** fib     CHA2DS2Vasc is 5, not felt to be a candidate for a/c      ***  3. HTN     ***   Disposition: F/u with ***  Current medicines are reviewed at length with the patient today.  The patient did not have any concerns regarding medicines.***  Signed, Francis DowseRenee Bland Rudzinski, PA-C 08/11/2017 11:14 AM     [facility] [address] [place] [address] [phone] (office)  [phone] (fax)

## 2017-08-12 ENCOUNTER — Ambulatory Visit (INDEPENDENT_AMBULATORY_CARE_PROVIDER_SITE_OTHER): Payer: PPO | Admitting: *Deleted

## 2017-08-12 DIAGNOSIS — R001 Bradycardia, unspecified: Secondary | ICD-10-CM | POA: Diagnosis not present

## 2017-08-12 NOTE — Progress Notes (Signed)
Remote pacemaker transmission.   

## 2017-08-14 ENCOUNTER — Encounter: Payer: PPO | Admitting: Physician Assistant

## 2017-08-15 ENCOUNTER — Encounter: Payer: Self-pay | Admitting: Cardiology

## 2017-08-16 LAB — CUP PACEART REMOTE DEVICE CHECK
Battery Remaining Longevity: 66 mo
Battery Remaining Percentage: 100 %
Brady Statistic RV Percent Paced: 0 %
Date Time Interrogation Session: 20181211143100
Implantable Lead Implant Date: 20130128
Implantable Lead Model: 4135
Implantable Lead Serial Number: 29117362
Implantable Pulse Generator Implant Date: 20130128
Lead Channel Impedance Value: 872 Ohm
Lead Channel Pacing Threshold Pulse Width: 0.4 ms
Lead Channel Setting Pacing Pulse Width: 0.4 ms
Lead Channel Setting Sensing Sensitivity: 2.5 mV
MDC IDC LEAD IMPLANT DT: 20130128
MDC IDC LEAD LOCATION: 753859
MDC IDC LEAD LOCATION: 753860
MDC IDC LEAD SERIAL: 29118103
MDC IDC MSMT LEADCHNL RA IMPEDANCE VALUE: 546 Ohm
MDC IDC MSMT LEADCHNL RA PACING THRESHOLD AMPLITUDE: 0.5 V
MDC IDC PG SERIAL: 123684
MDC IDC SET LEADCHNL RA PACING AMPLITUDE: 2 V
MDC IDC SET LEADCHNL RV PACING AMPLITUDE: 2.4 V
MDC IDC STAT BRADY RA PERCENT PACED: 40 %

## 2017-08-19 ENCOUNTER — Inpatient Hospital Stay (HOSPITAL_COMMUNITY)
Admission: EM | Admit: 2017-08-19 | Discharge: 2017-08-21 | DRG: 689 | Disposition: A | Discharge status: Home Health | Payer: PPO | Attending: Family Medicine | Admitting: Family Medicine

## 2017-08-19 ENCOUNTER — Encounter (HOSPITAL_COMMUNITY): Payer: Self-pay | Admitting: *Deleted

## 2017-08-19 ENCOUNTER — Emergency Department (HOSPITAL_COMMUNITY): Payer: PPO

## 2017-08-19 ENCOUNTER — Other Ambulatory Visit: Payer: Self-pay

## 2017-08-19 DIAGNOSIS — N3000 Acute cystitis without hematuria: Secondary | ICD-10-CM

## 2017-08-19 DIAGNOSIS — H409 Unspecified glaucoma: Secondary | ICD-10-CM | POA: Diagnosis present

## 2017-08-19 DIAGNOSIS — I4891 Unspecified atrial fibrillation: Secondary | ICD-10-CM | POA: Diagnosis not present

## 2017-08-19 DIAGNOSIS — R451 Restlessness and agitation: Secondary | ICD-10-CM | POA: Diagnosis not present

## 2017-08-19 DIAGNOSIS — Z66 Do not resuscitate: Secondary | ICD-10-CM | POA: Diagnosis not present

## 2017-08-19 DIAGNOSIS — I480 Paroxysmal atrial fibrillation: Secondary | ICD-10-CM | POA: Diagnosis present

## 2017-08-19 DIAGNOSIS — F419 Anxiety disorder, unspecified: Secondary | ICD-10-CM | POA: Diagnosis not present

## 2017-08-19 DIAGNOSIS — D649 Anemia, unspecified: Secondary | ICD-10-CM | POA: Diagnosis present

## 2017-08-19 DIAGNOSIS — R402441 Other coma, without documented Glasgow coma scale score, or with partial score reported, in the field [EMT or ambulance]: Secondary | ICD-10-CM | POA: Diagnosis not present

## 2017-08-19 DIAGNOSIS — E119 Type 2 diabetes mellitus without complications: Secondary | ICD-10-CM | POA: Diagnosis present

## 2017-08-19 DIAGNOSIS — F0390 Unspecified dementia without behavioral disturbance: Secondary | ICD-10-CM | POA: Diagnosis present

## 2017-08-19 DIAGNOSIS — R079 Chest pain, unspecified: Secondary | ICD-10-CM | POA: Diagnosis not present

## 2017-08-19 DIAGNOSIS — E785 Hyperlipidemia, unspecified: Secondary | ICD-10-CM | POA: Diagnosis not present

## 2017-08-19 DIAGNOSIS — G9341 Metabolic encephalopathy: Secondary | ICD-10-CM | POA: Diagnosis not present

## 2017-08-19 DIAGNOSIS — I10 Essential (primary) hypertension: Secondary | ICD-10-CM | POA: Diagnosis not present

## 2017-08-19 DIAGNOSIS — K219 Gastro-esophageal reflux disease without esophagitis: Secondary | ICD-10-CM | POA: Diagnosis not present

## 2017-08-19 DIAGNOSIS — Z880 Allergy status to penicillin: Secondary | ICD-10-CM | POA: Diagnosis not present

## 2017-08-19 DIAGNOSIS — F03918 Unspecified dementia, unspecified severity, with other behavioral disturbance: Secondary | ICD-10-CM

## 2017-08-19 DIAGNOSIS — F0391 Unspecified dementia with behavioral disturbance: Secondary | ICD-10-CM | POA: Diagnosis not present

## 2017-08-19 DIAGNOSIS — E1149 Type 2 diabetes mellitus with other diabetic neurological complication: Secondary | ICD-10-CM

## 2017-08-19 DIAGNOSIS — Z950 Presence of cardiac pacemaker: Secondary | ICD-10-CM | POA: Diagnosis not present

## 2017-08-19 DIAGNOSIS — N390 Urinary tract infection, site not specified: Secondary | ICD-10-CM | POA: Diagnosis not present

## 2017-08-19 LAB — URINALYSIS, ROUTINE W REFLEX MICROSCOPIC
Bilirubin Urine: NEGATIVE
GLUCOSE, UA: NEGATIVE mg/dL
KETONES UR: NEGATIVE mg/dL
Nitrite: NEGATIVE
PROTEIN: NEGATIVE mg/dL
Specific Gravity, Urine: 1.012 (ref 1.005–1.030)
pH: 5 (ref 5.0–8.0)

## 2017-08-19 LAB — CBC WITH DIFFERENTIAL/PLATELET
BASOS ABS: 0 10*3/uL (ref 0.0–0.1)
Basophils Relative: 0 %
EOS PCT: 0 %
Eosinophils Absolute: 0 10*3/uL (ref 0.0–0.7)
HEMATOCRIT: 41 % (ref 36.0–46.0)
Hemoglobin: 13.3 g/dL (ref 12.0–15.0)
LYMPHS PCT: 11 %
Lymphs Abs: 1.6 10*3/uL (ref 0.7–4.0)
MCH: 29.1 pg (ref 26.0–34.0)
MCHC: 32.4 g/dL (ref 30.0–36.0)
MCV: 89.7 fL (ref 78.0–100.0)
Monocytes Absolute: 0.8 10*3/uL (ref 0.1–1.0)
Monocytes Relative: 6 %
NEUTROS PCT: 83 %
Neutro Abs: 12.1 10*3/uL — ABNORMAL HIGH (ref 1.7–7.7)
PLATELETS: 252 10*3/uL (ref 150–400)
RBC: 4.57 MIL/uL (ref 3.87–5.11)
RDW: 14.1 % (ref 11.5–15.5)
WBC: 14.6 10*3/uL — AB (ref 4.0–10.5)

## 2017-08-19 LAB — COMPREHENSIVE METABOLIC PANEL
ALT: 13 U/L — ABNORMAL LOW (ref 14–54)
ANION GAP: 9 (ref 5–15)
AST: 17 U/L (ref 15–41)
Albumin: 4.1 g/dL (ref 3.5–5.0)
Alkaline Phosphatase: 54 U/L (ref 38–126)
BILIRUBIN TOTAL: 0.8 mg/dL (ref 0.3–1.2)
BUN: 21 mg/dL — ABNORMAL HIGH (ref 6–20)
CO2: 27 mmol/L (ref 22–32)
Calcium: 9.3 mg/dL (ref 8.9–10.3)
Chloride: 104 mmol/L (ref 101–111)
Creatinine, Ser: 1.01 mg/dL — ABNORMAL HIGH (ref 0.44–1.00)
GFR, EST AFRICAN AMERICAN: 54 mL/min — AB (ref >60)
GFR, EST NON AFRICAN AMERICAN: 47 mL/min — AB (ref >60)
Glucose, Bld: 128 mg/dL — ABNORMAL HIGH (ref 65–99)
POTASSIUM: 4.2 mmol/L (ref 3.5–5.1)
Sodium: 140 mmol/L (ref 135–145)
TOTAL PROTEIN: 8 g/dL (ref 6.5–8.1)

## 2017-08-19 LAB — I-STAT TROPONIN, ED: Troponin i, poc: 0 ng/mL (ref 0.00–0.08)

## 2017-08-19 MED ORDER — DEXTROSE 5 % IV SOLN
1.0000 g | Freq: Once | INTRAVENOUS | Status: AC
  Administered 2017-08-19: 1 g via INTRAVENOUS
  Filled 2017-08-19: qty 10

## 2017-08-19 MED ORDER — LISINOPRIL 10 MG PO TABS
10.0000 mg | ORAL_TABLET | Freq: Every day | ORAL | Status: DC
  Administered 2017-08-19 – 2017-08-21 (×3): 10 mg via ORAL
  Filled 2017-08-19 (×3): qty 1

## 2017-08-19 MED ORDER — FERROUS SULFATE 325 (65 FE) MG PO TABS
325.0000 mg | ORAL_TABLET | Freq: Every day | ORAL | Status: DC
  Administered 2017-08-20 – 2017-08-21 (×2): 325 mg via ORAL
  Filled 2017-08-19 (×2): qty 1

## 2017-08-19 MED ORDER — LORAZEPAM 0.5 MG PO TABS
0.5000 mg | ORAL_TABLET | Freq: Two times a day (BID) | ORAL | Status: DC
  Administered 2017-08-19 – 2017-08-21 (×4): 0.5 mg via ORAL
  Filled 2017-08-19 (×4): qty 1

## 2017-08-19 MED ORDER — ONDANSETRON HCL 4 MG/2ML IJ SOLN: 4.0000 mg | Freq: Four times a day (QID) | INTRAMUSCULAR | Status: DC | PRN

## 2017-08-19 MED ORDER — ENOXAPARIN SODIUM 40 MG/0.4ML ~~LOC~~ SOLN
40.0000 mg | SUBCUTANEOUS | Status: DC
  Administered 2017-08-19 – 2017-08-20 (×2): 40 mg via SUBCUTANEOUS
  Filled 2017-08-19 (×3): qty 0.4

## 2017-08-19 MED ORDER — FAMOTIDINE 20 MG PO TABS
20.0000 mg | ORAL_TABLET | Freq: Two times a day (BID) | ORAL | Status: DC
  Administered 2017-08-19 – 2017-08-21 (×4): 20 mg via ORAL
  Filled 2017-08-19 (×4): qty 1

## 2017-08-19 MED ORDER — SODIUM CHLORIDE 0.9 % IV SOLN
INTRAVENOUS | Status: AC
  Administered 2017-08-19 – 2017-08-20 (×2): via INTRAVENOUS

## 2017-08-19 MED ORDER — SERTRALINE HCL 50 MG PO TABS
50.0000 mg | ORAL_TABLET | Freq: Every day | ORAL | Status: DC
  Administered 2017-08-19 – 2017-08-21 (×3): 50 mg via ORAL
  Filled 2017-08-19 (×3): qty 1

## 2017-08-19 MED ORDER — LORAZEPAM 2 MG/ML IJ SOLN: 0.5000 mg | Freq: Four times a day (QID) | INTRAMUSCULAR | Status: DC | PRN

## 2017-08-19 MED ORDER — HALOPERIDOL LACTATE 5 MG/ML IJ SOLN: 2.0000 mg | Freq: Four times a day (QID) | INTRAMUSCULAR | Status: DC | PRN

## 2017-08-19 MED ORDER — TRAMADOL HCL 50 MG PO TABS: 50.0000 mg | ORAL_TABLET | Freq: Three times a day (TID) | ORAL | Status: DC | PRN

## 2017-08-19 MED ORDER — CEFTRIAXONE SODIUM 1 G IJ SOLR
1.0000 g | INTRAMUSCULAR | Status: DC
  Administered 2017-08-20 – 2017-08-21 (×2): 1 g via INTRAVENOUS
  Filled 2017-08-19 (×2): qty 10

## 2017-08-19 MED ORDER — SIMVASTATIN 40 MG PO TABS
40.0000 mg | ORAL_TABLET | Freq: Every day | ORAL | Status: DC
  Administered 2017-08-19 – 2017-08-20 (×2): 40 mg via ORAL
  Filled 2017-08-19 (×2): qty 1

## 2017-08-19 MED ORDER — HALOPERIDOL LACTATE 5 MG/ML IJ SOLN
2.0000 mg | Freq: Once | INTRAMUSCULAR | Status: AC
  Administered 2017-08-19: 2 mg via INTRAVENOUS
  Filled 2017-08-19: qty 1

## 2017-08-19 NOTE — ED Notes (Signed)
Bed: [id number] Expected date:  Expected time:  Means of arrival:  Comments: 83 yo increased agitation, confusion

## 2017-08-19 NOTE — ED Notes (Signed)
Patient getting up out of bed,trying to leave. Patient has dementia. Dr. Demetrius CharityP aware. Will order a Recruitment consultantsafety sitter.

## 2017-08-19 NOTE — Evaluation (Signed)
Occupational Therapy Evaluation Patient Details Name: Cindy ComasLucille M [name] MRN: [id number] DOB: [birth year] Today's Date: 08/19/2017    History of Present Illness Pt was admitted with increased agitation and confusion.  H/O dementia   Clinical Impression   This 83 year old female was admitted for the above.  Unsure of PLOF.  Pt cooperative and followed commands in context with multimodal and repeated cues; she was internally distracted by folding and counting tissues.  She needed min +2 assist for safety when walking to bathroom due to decreased balance. Will follow her in acute setting with min A level goals (+1)    Follow Up Recommendations  SNF    Equipment Recommendations  (? 3:1 commode)    Recommendations for Other Services       Precautions / Restrictions Precautions Precautions: Fall Restrictions Weight Bearing Restrictions: No      Mobility Bed Mobility Overal bed mobility: Needs Assistance Bed Mobility: Supine to Sit;Sit to Supine     Supine to sit: Min guard Sit to supine: Min guard   General bed mobility comments: for safety  Transfers Overall transfer level: Needs assistance Equipment used: 2 person hand held assist Transfers: Sit to/from Stand Sit to Stand: Min assist;+2 for safety         General transfer comment: pt unsteady when standing/walking.      Balance Overall balance assessment: Needs assistance           Standing balance-Leahy Scale: Poor Standing balance comment: hand held assistance for balance                           ADL either performed or assessed with clinical judgement   ADL Overall ADL's : Needs assistance/impaired     Grooming: Min guard;Standing;Wash/dry Electrical engineerhands                   Toilet Transfer: Minimal assistance;+2 for safety;Ambulation;Regular Toilet;Grab bars   Toileting- Clothing Manipulation and Hygiene: Minimal assistance;Sit to/from stand         General ADL Comments: based on  clinical judgement, pt needs min A for UB adls and max A for LB adls.  Did not perform entire ADL at time of evaluation. Pt is internally distracted but did use toilet and wash hands.  She was wearing 2 pairs of underwear, but one pair was on her incorrectly around her waist.       Vision         Perception     Praxis      Pertinent Vitals/Pain Pain Assessment: No/denies pain     Hand Dominance     Extremity/Trunk Assessment Upper Extremity Assessment Upper Extremity Assessment: Difficult to assess due to impaired cognition           Communication Communication Communication: Prefers language other than English   Cognition Arousal/Alertness: Awake/alert Behavior During Therapy: WFL for tasks assessed/performed Overall Cognitive Status: No family/caregiver present to determine baseline cognitive functioning                                 General Comments: h/o dementia.  Pt follows commands.  Was folding tissue and asked for 5 pieces of toilet paper when in bathroom   General Comments       Exercises     Shoulder Instructions      Home Living Family/patient expects to be discharged to:: Unsure  Additional Comments: per chart, son is pt's caregiver      Prior Functioning/Environment          Comments: unknown, pt unable to state        OT Problem List: Decreased strength;Decreased activity tolerance;Impaired balance (sitting and/or standing);Decreased cognition;Decreased safety awareness      OT Treatment/Interventions: Self-care/ADL training;DME and/or AE instruction;Patient/family education;Balance training;Therapeutic activities;Cognitive remediation/compensation    OT Goals(Current goals can be found in the care plan section) Acute Rehab OT Goals Patient Stated Goal: none stated; agreeable to OOB/bathroom OT Goal Formulation: Patient unable to participate in goal setting Time For Goal  Achievement: 09/02/17 Potential to Achieve Goals: Fair ADL Goals Pt Will Transfer to Toilet: with min assist;bedside commode;ambulating Additional ADL Goal #1: pt will complete UB adls with set up/supervision and cues and LB adls with min A and cues  OT Frequency: Min 2X/week   Barriers to D/C:            Co-evaluation PT/OT/SLP Co-Evaluation/Treatment: Yes Reason for Co-Treatment: For patient/therapist safety PT goals addressed during session: Mobility/safety with mobility OT goals addressed during session: ADL's and self-care      AM-PAC PT "6 Clicks" Daily Activity     Outcome Measure Help from another person eating meals?: A Little Help from another person taking care of personal grooming?: A Little Help from another person toileting, which includes using toliet, bedpan, or urinal?: A Little Help from another person bathing (including washing, rinsing, drying)?: A Lot Help from another person to put on and taking off regular upper body clothing?: A Little Help from another person to put on and taking off regular lower body clothing?: A Lot 6 Click Score: 16   End of Session    Activity Tolerance: Patient tolerated treatment well Patient left: in bed;with call bell/phone within reach;with nursing/sitter in room  OT Visit Diagnosis: Unsteadiness on feet (R26.81)                Time: [id number] OT Time Calculation (min): 10 min Charges:  OT General Charges $OT Visit: 1 Visit OT Evaluation $OT Eval Low Complexity: 1 Low G-Codes: OT G-codes **NOT FOR INPATIENT CLASS** Functional Assessment Tool Used: Clinical judgement Functional Limitation: Self care Self Care Current Status (Q0347(G8987): At least 60 percent but less than 80 percent impaired, limited or restricted Self Care Goal Status (Q2595(G8988): At least 20 percent but less than 40 percent impaired, limited or restricted   Marica OtterMaryellen Charity Tessier, OTR/L [phone] 08/19/2017  Amma Crear 08/19/2017, 2:21 PM

## 2017-08-19 NOTE — ED Notes (Signed)
Pt's son Gerlene BurdockRichard, can be reached at [phone]

## 2017-08-19 NOTE — Progress Notes (Addendum)
CSW attempted to contact pt's son. CSW left voice message for pt's son. CSW has not been able to reach family. CSW assessment has not been completed due to not being able to reach family member.   Montine CircleKelsy Destin Kittler, Silverio LayLCSWA [facility]  [phone]

## 2017-08-19 NOTE — ED Triage Notes (Signed)
Son contact info: Marily MemosRichard Bureau [phone]

## 2017-08-19 NOTE — ED Notes (Signed)
Pts son called asking for an update. Stated he received calls today but did not answer them as the [facility] ID didn't show up. Son will now answer calls.

## 2017-08-19 NOTE — ED Triage Notes (Signed)
Per EMS, patient comes from home. Hx of dementia, increased agitation/confusion, lives with son. Alert, oriented to self. Son also wants to speak with social work.

## 2017-08-19 NOTE — Progress Notes (Addendum)
CSW has prepared information just in case pt needs to be faxed out at this time for SNF. CSW was informed that pt has a sitter at this time. CSW explained that if this is the case pt may not be able to be placed until pt has not had a sitter for 24 hours. CSW will fax pt out once CSW has spoken with pt son and receive permission to do so.  CSW will continue to follow for needs as presented at this time.    Claude MangesKierra S. Shondrea Steinert, MSW, LCSW-A Emergency Department Clinical Social Worker [phone]

## 2017-08-19 NOTE — Progress Notes (Addendum)
CSW reached out to pt's son again to discuss next steps in pt's care. no answer at this time. CSW will continue to try.     Claude MangesKierra S. Joniel Graumann, MSW, LCSW-A Emergency Department Clinical Social Worker [phone]

## 2017-08-19 NOTE — ED Notes (Signed)
Patient son to bedside, along with hospitalist and EDP.

## 2017-08-19 NOTE — Evaluation (Signed)
Physical Therapy Evaluation Patient Details Name: Cindy ComasLucille M [name] MRN: [id number] DOB: 23-Mar-1923 Today's Date: 08/19/2017   History of Present Illness  Pt was admitted with increased agitation and confusion.  H/O dementia  Clinical Impression  Patient required frequent redirection for mobility, ambulation. Was not agitated. Pt admitted with above diagnosis. Pt currently with functional limitations due to the deficits listed below (see PT Problem List). Pt will benefit from skilled PT to increase their independence and safety with mobility to allow discharge to the venue listed below.       Follow Up Recommendations SNF- no family present    Equipment Recommendations  None recommended by PT    Recommendations for Other Services       Precautions / Restrictions Precautions Precautions: Fall Restrictions Weight Bearing Restrictions: No      Mobility  Bed Mobility Overal bed mobility: Needs Assistance Bed Mobility: Supine to Sit;Sit to Supine     Supine to sit: Min guard Sit to supine: Min guard   General bed mobility comments: for safety  Transfers Overall transfer level: Needs assistance Equipment used: 2 person hand held assist Transfers: Sit to/from Stand Sit to Stand: Min assist;+2 safety/equipment         General transfer comment: pt unsteady when standing/walking.    Ambulation/Gait Ambulation/Gait assistance: Min assist;+2 safety/equipment;+2 physical assistance Ambulation Distance (Feet): 50 Feet(x2 ) Assistive device: 2 person hand held assist Gait Pattern/deviations: Step-to pattern;Step-through pattern;Staggering left;Staggering right     General Gait Details: steady assist for balance , frequent redirection and direction for mobility.   Stairs            Wheelchair Mobility    Modified Rankin (Stroke Patients Only)       Balance Overall balance assessment: Needs assistance Sitting-balance support: Feet supported;Bilateral upper  extremity supported Sitting balance-Leahy Scale: Fair Sitting balance - Comments: able to lean forward to wife after toileting     Standing balance-Leahy Scale: Poor Standing balance comment: hand held assistance for balance                             Pertinent Vitals/Pain Pain Assessment: No/denies pain    Home Living Family/patient expects to be discharged to:: Unsure                 Additional Comments: per chart, son is pt's caregiver    Prior Function           Comments: unknown, pt unable to state     Hand Dominance        Extremity/Trunk Assessment   Upper Extremity Assessment Upper Extremity Assessment: Defer to OT evaluation    Lower Extremity Assessment Lower Extremity Assessment: Generalized weakness       Communication   Communication: No difficulties;Other (comment)(perseverates on counting tisuues)  Cognition Arousal/Alertness: Awake/alert Behavior During Therapy: WFL for tasks assessed/performed Overall Cognitive Status: No family/caregiver present to determine baseline cognitive functioning                                 General Comments: h/o dementia.  Pt follows commands. perseverating on  Was folding  and counting tissue and asked for 5 pieces of toilet paper when in bathroom      General Comments      Exercises     Assessment/Plan    PT Assessment Patient needs continued PT services  PT Problem List Decreased balance;Decreased activity tolerance;Decreased mobility;Decreased cognition;Decreased safety awareness       PT Treatment Interventions DME instruction;Gait training;Functional mobility training;Therapeutic activities;Patient/family education    PT Goals (Current goals can be found in the Care Plan section)  Acute Rehab PT Goals Patient Stated Goal: none stated; agreeable to OOB/bathroom PT Goal Formulation: Patient unable to participate in goal setting Time For Goal Achievement:  09/02/17 Potential to Achieve Goals: Fair    Frequency Min 2X/week   Barriers to discharge Decreased caregiver support      Co-evaluation   Reason for Co-Treatment: For patient/therapist safety PT goals addressed during session: Mobility/safety with mobility OT goals addressed during session: ADL's and self-care       AM-PAC PT "6 Clicks" Daily Activity  Outcome Measure Difficulty turning over in bed (including adjusting bedclothes, sheets and blankets)?: A Lot Difficulty moving from lying on back to sitting on the side of the bed? : A Lot Difficulty sitting down on and standing up from a chair with arms (e.g., wheelchair, bedside commode, etc,.)?: Unable Help needed moving to and from a bed to chair (including a wheelchair)?: Total Help needed walking in hospital room?: Total Help needed climbing 3-5 steps with a railing? : Total 6 Click Score: 8    End of Session   Activity Tolerance: Patient tolerated treatment well Patient left: in bed;with call bell/phone within reach;with nursing/sitter in room Nurse Communication: Mobility status PT Visit Diagnosis: Difficulty in walking, not elsewhere classified (R26.2)    Time: [id number] PT Time Calculation (min) (ACUTE ONLY): 12 min   Charges:   PT Evaluation $PT Eval Low Complexity: 1 Low     PT G Codes:   PT G-Codes **NOT FOR INPATIENT CLASS** Functional Assessment Tool Used: AM-PAC 6 Clicks Basic Mobility;Clinical judgement Functional Limitation: Mobility: Walking and moving around Mobility: Walking and Moving Around Current Status (V4098(G8978): At least 40 percent but less than 60 percent impaired, limited or restricted Mobility: Walking and Moving Around Goal Status [phone](G8979): At least 1 percent but less than 20 percent impaired, limited or restricted    [facility] CenterKaren Tylor Courtwright PT [phone]   Rada HayHill, Samanyu Tinnell Elizabeth 08/19/2017, 2:50 PM

## 2017-08-19 NOTE — Progress Notes (Signed)
CSW spoke with Crystal from [facility] and began [facility] CSW was informed that pt may or may not get auth, but Crystal would try and see about it. Crystal will follow up with CSW either later today or in the morning with an update. CSW will began process to placement for pt. CSW will continue to reach out to pt's son for update.     Claude MangesKierra S. Gotti Alwin, MSW, LCSW-A Emergency Department Clinical Social Worker [phone]

## 2017-08-19 NOTE — Progress Notes (Addendum)
Patient c/o nausea. Merdis DelayK. Schorr, NP paged.   2100- New orders received. Pt refusing Zofran at this time, states she is no longer feeling nauseous.

## 2017-08-19 NOTE — ED Notes (Signed)
ED TO INPATIENT HANDOFF REPORT  Name/Age/Gender [name] 83 y.o. female  Code Status    Code Status Orders  (From admission, onward)        Start     Ordered   08/19/17 1634  Do not attempt resuscitation (DNR)  Continuous    Question Answer Comment  In the event of cardiac or respiratory ARREST Do not call a "code blue"   In the event of cardiac or respiratory ARREST Do not perform Intubation, CPR, defibrillation or ACLS   In the event of cardiac or respiratory ARREST Use medication by any route, position, wound care, and other measures to relive pain and suffering. May use oxygen, suction and manual treatment of airway obstruction as needed for comfort.      08/19/17 1633    Code Status History    Date Active Date Inactive Code Status Order ID Comments User Context   09/29/2011 01:00 09/30/2011 17:52 Full Code [id number]  Theressa Millard, MD ED    Advance Directive Documentation     Most Recent Value  Type of Advance Directive  Healthcare Power of [place], Living will [son states pt has a dnr also]  Pre-existing out of facility DNR order (yellow form or pink MOST form)  No data  "MOST" Form in Place?  No data      Home/SNF/Other Home  Chief Complaint adgated  Level of Care/Admitting Diagnosis ED Disposition    ED Disposition Condition Comment   Admit  Hospital Area: [facility] [[id number]]  Level of Care: Med-Surg [16]  Diagnosis: UTI (urinary tract infection) [196222]  Admitting Physician: Caren Griffins [phone]  Attending Physician: Caren Griffins [[id number]]  PT Class (Do Not Modify): Observation [104]  PT Acc Code (Do Not Modify): Observation [10022]       Medical History Past Medical History:  Diagnosis Date  . ANEMIA, DEFICIENCY, HX OF 03/01/2010  . Anxiety   . DEPRESSIVE DISORDER 09/19/2010  . DM w/o Complication Type II 05/23/1940  . GERD 05/18/2007  . Glaucoma   . HYPERLIPIDEMIA 03/17/2008  . HYPERTENSION 05/18/2007  .  INSOMNIA 11/23/2008  . Kidney stone   . Loss of weight 05/30/2010    Allergies Allergies  Allergen Reactions  . Amoxicillin Rash  . Penicillins Rash    Has patient had a PCN reaction causing immediate rash, facial/tongue/throat swelling, SOB or lightheadedness with hypotension: unknown Has patient had a PCN reaction causing severe rash involving mucus membranes or skin necrosis: unkonwn Has patient had a PCN reaction that required hospitalization unknown Has patient had a PCN reaction occurring within the last 10 years: No If all of the above ansers are "NO", then may proceed with Cephalosporin use.     IV Location/Drains/Wounds Patient Lines/Drains/Airways Status   Active Line/Drains/Airways    Name:   Placement date:   Placement time:   Site:   Days:   Peripheral IV 08/19/17 Right Antecubital   08/19/17    0944    Antecubital   less than 1   Incision 09/30/11 Shoulder Left   09/30/11    -     2150          Labs/Imaging Results for orders placed or performed during the hospital encounter of 08/19/17 (from the past 48 hour(s))  Comprehensive metabolic panel     Status: Abnormal   Collection Time: 08/19/17  9:25 AM  Result Value Ref Range   Sodium 140 135 - 145 mmol/L   Potassium  4.2 3.5 - 5.1 mmol/L   Chloride 104 101 - 111 mmol/L   CO2 27 22 - 32 mmol/L   Glucose, Bld 128 (H) 65 - 99 mg/dL   BUN 21 (H) 6 - 20 mg/dL   Creatinine, Ser 1.01 (H) 0.44 - 1.00 mg/dL   Calcium 9.3 8.9 - 10.3 mg/dL   Total Protein 8.0 6.5 - 8.1 g/dL   Albumin 4.1 3.5 - 5.0 g/dL   AST 17 15 - 41 U/L   ALT 13 (L) 14 - 54 U/L   Alkaline Phosphatase 54 38 - 126 U/L   Total Bilirubin 0.8 0.3 - 1.2 mg/dL   GFR calc non Af Amer 47 (L) >60 mL/min   GFR calc Af Amer 54 (L) >60 mL/min    Comment: (NOTE) The eGFR has been calculated using the CKD EPI equation. This calculation has not been validated in all clinical situations. eGFR's persistently <60 mL/min signify possible Chronic Kidney Disease.     Anion gap 9 5 - 15  CBC with Differential     Status: Abnormal   Collection Time: 08/19/17  9:25 AM  Result Value Ref Range   WBC 14.6 (H) 4.0 - 10.5 K/uL   RBC 4.57 3.87 - 5.11 MIL/uL   Hemoglobin 13.3 12.0 - 15.0 g/dL   HCT 41.0 36.0 - 46.0 %   MCV 89.7 78.0 - 100.0 fL   MCH 29.1 26.0 - 34.0 pg   MCHC 32.4 30.0 - 36.0 g/dL   RDW 14.1 11.5 - 15.5 %   Platelets 252 150 - 400 K/uL   Neutrophils Relative % 83 %   Neutro Abs 12.1 (H) 1.7 - 7.7 K/uL   Lymphocytes Relative 11 %   Lymphs Abs 1.6 0.7 - 4.0 K/uL   Monocytes Relative 6 %   Monocytes Absolute 0.8 0.1 - 1.0 K/uL   Eosinophils Relative 0 %   Eosinophils Absolute 0.0 0.0 - 0.7 K/uL   Basophils Relative 0 %   Basophils Absolute 0.0 0.0 - 0.1 K/uL  Urinalysis, Routine w reflex microscopic     Status: Abnormal   Collection Time: 08/19/17  9:26 AM  Result Value Ref Range   Color, Urine YELLOW YELLOW   APPearance CLEAR CLEAR   Specific Gravity, Urine 1.012 1.005 - 1.030   pH 5.0 5.0 - 8.0   Glucose, UA NEGATIVE NEGATIVE mg/dL   Hgb urine dipstick SMALL (A) NEGATIVE   Bilirubin Urine NEGATIVE NEGATIVE   Ketones, ur NEGATIVE NEGATIVE mg/dL   Protein, ur NEGATIVE NEGATIVE mg/dL   Nitrite NEGATIVE NEGATIVE   Leukocytes, UA MODERATE (A) NEGATIVE   RBC / HPF 0-5 0 - 5 RBC/hpf   WBC, UA 6-30 0 - 5 WBC/hpf   Bacteria, UA RARE (A) NONE SEEN   Squamous Epithelial / LPF 0-5 (A) NONE SEEN   Mucus PRESENT   I-stat troponin, ED     Status: None   Collection Time: 08/19/17  9:37 AM  Result Value Ref Range   Troponin i, poc 0.00 0.00 - 0.08 ng/mL   Comment 3            Comment: Due to the release kinetics of cTnI, a negative result within the first hours of the onset of symptoms does not rule out myocardial infarction with certainty. If myocardial infarction is still suspected, repeat the test at appropriate intervals.    Dg Chest 2 View  Result Date: 08/19/2017 CLINICAL DATA:  History of dimension. Increased confusion.  Onset of mid  chest pain today. History of hyperlipidemia, diabetes, hypertension common nonsmoker. EXAM: CHEST  2 VIEW COMPARISON:  Chest x-ray of November 20, 2015 FINDINGS: The lungs arm adequately inflated. There is no focal infiltrate. The cardiac silhouette is enlarged. The pulmonary vascularity is normal. The mediastinum is normal in width. There is calcification in the wall of the thoracic aorta. The ICD is in stable position. There is multilevel degenerative disc disease of the thoracic spine. IMPRESSION: Mild cardiomegaly without pulmonary edema. No acute pneumonia nor other acute cardiopulmonary abnormality. Thoracic aortic atherosclerosis. Electronically Signed   By: David  Martinique M.D.   On: 08/19/2017 10:12    Pending Labs Unresulted Labs (From admission, onward)   Start     Ordered   08/19/17 1144  Urine culture  STAT,   STAT     08/19/17 1145      Vitals/Pain Today's Vitals   08/19/17 0807 08/19/17 0812 08/19/17 1139 08/19/17 1625  BP:  (!) 151/73 (!) 152/117 (!) 175/74  Pulse:  71 91 64  Resp:  _0 Temp:  (!) 97.4 F (36.3 C)  98.1 F (36.7 C)  TempSrc:  Oral  Oral  SpO2: 100% 98% 98% 98%  Weight:  160 lb (72.6 kg)    Height:  5' 5" (1.651 m)      Isolation Precautions No active isolations  Medications Medications  cefTRIAXone (ROCEPHIN) 1 g in dextrose 5 % 50 mL IVPB (not administered)  haloperidol lactate (HALDOL) injection 2 mg (not administered)  famotidine (PEPCID) tablet 20 mg (not administered)  ferrous sulfate tablet 325 mg (not administered)  lisinopril (PRINIVIL,ZESTRIL) tablet 10 mg (not administered)  LORazepam (ATIVAN) tablet 0.5 mg (not administered)  sertraline (ZOLOFT) tablet 50 mg (not administered)  simvastatin (ZOCOR) tablet 40 mg (not administered)  traMADol (ULTRAM) tablet 50 mg (not administered)  LORazepam (ATIVAN) injection 0.5 mg (not administered)  enoxaparin (LOVENOX) injection 40 mg (not administered)  0.9 %  sodium chloride  infusion (not administered)  cefTRIAXone (ROCEPHIN) 1 g in dextrose 5 % 50 mL IVPB (0 g Intravenous Stopped 08/19/17 1259)  haloperidol lactate (HALDOL) injection 2 mg (2 mg Intravenous Given 08/19/17 1408)    Mobility walks

## 2017-08-19 NOTE — H&P (Signed)
History and Physical    Cindy ComasLucille M [name] ZOX:[id number] DOB: 1923/07/11 DOA: 08/19/2017  I have briefly reviewed the patient's prior medical records in [facility] [facility]  PCP: Gordy SaversKwiatkowski, Peter F, MD  Patient coming from: Home  Chief Complaint: Worsening confusion  HPI: Cindy ComasLucille M [name] is a 83 y.o. female with medical history significant of advanced dementia, atrial fibrillation status post pacemaker, hypertension, diet-controlled diabetes mellitus, hyperlipidemia, is being brought to the hospital by patient's son due to increased confusion and agitation this morning.  At baseline, patient has advanced dementia however is very calm, able to ambulate some in the home, and occasionally goes out with her son to restaurant.  Starting early this morning, per son around 5 AM, when she woke up she was very agitated, crying, and extremely confused.  He was concerned about her agitation and impulsiveness and was afraid that she may fall at home, and brought the patient to the emergency room for evaluation.  In the ED she seems to be very upset, confused, and appears agitated.  She cannot contribute significantly to the story.  ED Course: In the ED she is afebrile, normotensive, satting well on room air.  Blood work reveals a leukocytosis with a white count of 14, creatinine of 1.0, and UA slightly positive for moderate leukocytes, bacteria present as well as white cell counts.  Given sudden change in mental status, leukocytosis and slightly positive UA for felt like she has a UTI and we were asked to admit  Review of Systems: As per HPI otherwise 10 point review of systems negative.   Past Medical History:  Diagnosis Date  . ANEMIA, DEFICIENCY, HX OF 03/01/2010  . Anxiety   . DEPRESSIVE DISORDER 09/19/2010  . DM w/o Complication Type II 05/18/2007  . GERD 05/18/2007  . Glaucoma   . HYPERLIPIDEMIA 03/17/2008  . HYPERTENSION 05/18/2007  . INSOMNIA 11/23/2008  . Kidney stone   . Loss of weight 05/30/2010      Past Surgical History:  Procedure Laterality Date  . ABDOMINAL HYSTERECTOMY    . APPENDECTOMY    . CATARACT EXTRACTION    . COLONOSCOPY  06-28-10   per Dr. Arlyce DiceKaplan, diverticulosis only   . DILATION AND CURETTAGE OF UTERUS    . ESOPHAGOGASTRODUODENOSCOPY  05-13-11   per Dr. Arlyce DiceKaplan, normal   . HEMORRHOID SURGERY    . PACEMAKER INSERTION  09/2011  . PERMANENT PACEMAKER INSERTION N/A 09/30/2011   Procedure: PERMANENT PACEMAKER INSERTION;  Surgeon: Marinus MawGregg W Taylor, MD;  Location[facility] CATH LAB;  Service: Cardiovascular;  Laterality: N/A;     reports that  has never smoked. she has never used smokeless tobacco. She reports that she does not drink alcohol or use drugs.  Allergies  Allergen Reactions  . Amoxicillin Rash  . Penicillins Rash    Has patient had a PCN reaction causing immediate rash, facial/tongue/throat swelling, SOB or lightheadedness with hypotension: unknown Has patient had a PCN reaction causing severe rash involving mucus membranes or skin necrosis: unkonwn Has patient had a PCN reaction that required hospitalization unknown Has patient had a PCN reaction occurring within the last 10 years: No If all of the above ansers are "NO", then may proceed with Cephalosporin use.     Family History  Problem Relation Age of Onset  . Unexplained death Mother   . Brain cancer Father        brain tumor  . Colon cancer Sister   . Colitis Brother   . Heart attack Brother  Prior to Admission medications   Medication Sig Start Date End Date Taking? Authorizing Provider  acetaminophen (TYLENOL) 500 MG tablet Take 1,000 mg by mouth every 6 (six) hours as needed for moderate pain (pain). For pain    [provider]  famotidine (PEPCID) 20 MG tablet TAKE ONE TABLET BY MOUTH TWICE DAILY 03/21/17   Gordy Savers, MD  ferrous sulfate 325 (65 FE) MG tablet Take 1 tablet (325 mg total) by mouth daily with breakfast. 05/11/15   Gordy Savers, MD  furosemide (LASIX)  20 MG tablet Take 1 tablet (20 mg total) by mouth daily. 06/17/17   Gordy Savers, MD  lisinopril (PRINIVIL,ZESTRIL) 10 MG tablet Take 1 tablet (10 mg total) by mouth daily. 06/17/17   Gordy Savers, MD  LORazepam (ATIVAN) 0.5 MG tablet TAKE 1 TABLET BY MOUTH THREE TIMES DAILY 08/05/17   Gordy Savers, MD  Multiple Vitamin (MULTIVITAMIN WITH MINERALS) TABS tablet Take 1 tablet by mouth daily.    [provider]  sertraline (ZOLOFT) 50 MG tablet Take 1 tablet (50 mg total) by mouth daily. 09/23/16   Gordy Savers, MD  simvastatin (ZOCOR) 40 MG tablet TAKE ONE TABLET BY MOUTH ONCE DAILY AT BEDTIME 04/02/17   Gordy Savers, MD  traMADol (ULTRAM) 50 MG tablet TAKE 1 TABLET BY MOUTH EVERY 8 HOURS AS NEEDED FOR PAIN 07/21/17   Gordy Savers, MD    Physical Exam: Vitals:   08/19/17 0807 08/19/17 0812 08/19/17 1139  BP:  (!) 151/73 (!) 152/117  Pulse:  71 91  Resp:  18 18  Temp:  (!) 97.4 F (36.3 C)   TempSrc:  Oral   SpO2: 100% 98% 98%  Weight:  72.6 kg (160 lb)   Height:  5\' 5"  (1.651 m)     Constitutional: Agitated, son is in the room comforting patient Eyes: lids and conjunctivae normal ENMT: Mucous membranes are moist.  Neck: normal, supple Respiratory: clear to auscultation bilaterally, no wheezing, no crackles. Normal respiratory effort. No accessory muscle use.  Cardiovascular: Regular rate and rhythm, no murmurs / rubs / gallops. No extremity edema.  Abdomen: no tenderness Musculoskeletal: no clubbing / cyanosis.  Decreased muscle tone.  Skin: no rashes, lesions, ulcers. No induration Neurologic: nonfocal Psychiatric: Anxious, agitated  Labs on Admission: I have personally reviewed following labs and imaging studies  CBC: Recent Labs  Lab 08/19/17 0925  WBC 14.6*  NEUTROABS 12.1*  HGB 13.3  HCT 41.0  MCV 89.7  PLT 252   Basic Metabolic Panel: Recent Labs  Lab 08/19/17 0925  NA 140  K 4.2  CL 104  CO2 27  GLUCOSE  128*  BUN 21*  CREATININE 1.01*  CALCIUM 9.3   GFR: Estimated Creatinine Clearance: 35.5 mL/min (A) (by C-G formula based on SCr of 1.01 mg/dL (H)). Liver Function Tests: Recent Labs  Lab 08/19/17 0925  AST 17  ALT 13*  ALKPHOS 54  BILITOT 0.8  PROT 8.0  ALBUMIN 4.1   No results for input(s): LIPASE, AMYLASE in the last 168 hours. No results for input(s): AMMONIA in the last 168 hours. Coagulation Profile: No results for input(s): INR, PROTIME in the last 168 hours. Cardiac Enzymes: No results for input(s): CKTOTAL, CKMB, CKMBINDEX, TROPONINI in the last 168 hours. BNP (last 3 results) No results for input(s): PROBNP in the last 8760 hours. HbA1C: No results for input(s): HGBA1C in the last 72 hours. CBG: No results for input(s): GLUCAP in the last  168 hours. Lipid Profile: No results for input(s): CHOL, HDL, LDLCALC, TRIG, CHOLHDL, LDLDIRECT in the last 72 hours. Thyroid Function Tests: No results for input(s): TSH, T4TOTAL, FREET4, T3FREE, THYROIDAB in the last 72 hours. Anemia Panel: No results for input(s): VITAMINB12, FOLATE, FERRITIN, TIBC, IRON, RETICCTPCT in the last 72 hours. Urine analysis:    Component Value Date/Time   COLORURINE YELLOW 08/19/2017 0926   APPEARANCEUR CLEAR 08/19/2017 0926   LABSPEC 1.012 08/19/2017 0926   PHURINE 5.0 08/19/2017 0926   GLUCOSEU NEGATIVE 08/19/2017 0926   HGBUR SMALL (A) 08/19/2017 0926   HGBUR negative 04/19/2010 1422   BILIRUBINUR NEGATIVE 08/19/2017 0926   BILIRUBINUR 1+ 06/24/2012 1447   KETONESUR NEGATIVE 08/19/2017 0926   PROTEINUR NEGATIVE 08/19/2017 0926   UROBILINOGEN 0.2 10/18/2014 2027   NITRITE NEGATIVE 08/19/2017 0926   LEUKOCYTESUR MODERATE (A) 08/19/2017 0926     Radiological Exams on Admission: Dg Chest 2 View  Result Date: 08/19/2017 CLINICAL DATA:  History of dimension. Increased confusion. Onset of mid chest pain today. History of hyperlipidemia, diabetes, hypertension common nonsmoker. EXAM:  CHEST  2 VIEW COMPARISON:  Chest x-ray of November 20, 2015 FINDINGS: The lungs arm adequately inflated. There is no focal infiltrate. The cardiac silhouette is enlarged. The pulmonary vascularity is normal. The mediastinum is normal in width. There is calcification in the wall of the thoracic aorta. The ICD is in stable position. There is multilevel degenerative disc disease of the thoracic spine. IMPRESSION: Mild cardiomegaly without pulmonary edema. No acute pneumonia nor other acute cardiopulmonary abnormality. Thoracic aortic atherosclerosis. Electronically Signed   By: David  SwazilandJordan M.D.   On: 08/19/2017 10:12    EKG: Independently reviewed.  Paced rhythm  Assessment/Plan Active Problems:   Diabetes mellitus without complication (HCC)   Essential hypertension   Atrial fibrillation (HCC)   Status post cardiac pacemaker procedure   UTI (urinary tract infection)   Dementia   UTI -She does have a white count and rather onset change in her behavior, start empirically ceftriaxone and monitor urine cultures.  Dementia -Advanced, however without agitation per son -Haldol/Ativan as needed  Atrial fibrillation now with pacemaker -Stable EKG, will not place on telemetry as it may increase her agitation  Diet controlled diabetes mellitus -Most recent hemoglobin A1c was 6.2 in October 2018  Hypertension -Hold Lasix, she appears dry, gentle IV fluids -Hold lisinopril for now monitor Cr with hydration   DVT prophylaxis: Lovenox  Code Status: DNR  Family Communication: son at bedside Disposition Plan: admit to medsurg, home if calm vs SNF if son cannot care for her Consults called: none      Admission status: Observation   At the point of initial evaluation, it is my clinical opinion that admission for OBSERVATION is reasonable and necessary because the patient's presenting complaints in the context of their chronic conditions represent sufficient risk of deterioration or significant  morbidity to constitute reasonable grounds for close observation in the hospital setting, but that the patient may be medically stable for discharge from the hospital within 24 to 48 hours.   Pamella Pertostin Anamaria Dusenbury, MD [facility] Pager [phone]  If 7PM-7AM, please contact night-coverage [email or web address] Password [email or web address]  08/19/2017, 2:33 PM

## 2017-08-19 NOTE — ED Provider Notes (Addendum)
[facility]-EMERGENCY DEPT Provider Note   CSN: [id number] Arrival date & time: 08/19/17  0804     History   Chief Complaint Chief Complaint  Patient presents with  . Agitation    HPI [name] is a 83 y.o. female.  HPI At this time, limited history.  Patient is from home with report of increased confusion.  She is cared for by her son.  He is not here at this time.  Patient is alert and interactive.  She reports she is not sure why she is here.  She reports someone had checked her blood pressure and it was high.  She denies any immediate symptoms.   Multiple attempts have been made by social work to contact the patient's son.  Reportedly, he called the nurses station to check in and said he had not answered calls to him because they did not have an identifying number.  He was instructed to answer all subsequent calls because they are coming from the hospital.  I have tried to call the patient's son twice since this time with no answer.  Patient son is now in the emergency department.  He reports there was a very abrupt change in her behavior this morning.  Normally she is cooperative to go out to lunch etc.  This morning her confusion was extreme and her agitation not manageable. Past Medical History:  Diagnosis Date  . ANEMIA, DEFICIENCY, HX OF 03/01/2010  . Anxiety   . DEPRESSIVE DISORDER 09/19/2010  . DM w/o Complication Type II 05/18/2007  . GERD 05/18/2007  . Glaucoma   . HYPERLIPIDEMIA 03/17/2008  . HYPERTENSION 05/18/2007  . INSOMNIA 11/23/2008  . Kidney stone   . Loss of weight 05/30/2010    Patient Active Problem List   Diagnosis Date Noted  . Anxiety 10/27/2012  . Symptomatic bradycardia 10/01/2011  . Status post cardiac pacemaker procedure 10/01/2011  . Atrial fibrillation (HCC) 09/29/2011  . Pain disorder 04/19/2011  . Chest pain, atypical 02/20/2011  . DEPRESSIVE DISORDER 09/19/2010  . LOSS OF WEIGHT 05/30/2010  . ANEMIA, DEFICIENCY,  HX OF 03/01/2010  . INSOMNIA 11/23/2008  . Dyslipidemia 03/17/2008  . Diabetes mellitus without complication (HCC) 05/18/2007  . Essential hypertension 05/18/2007  . GERD 05/18/2007    Past Surgical History:  Procedure Laterality Date  . ABDOMINAL HYSTERECTOMY    . APPENDECTOMY    . CATARACT EXTRACTION    . COLONOSCOPY  06-28-10   per Dr. Arlyce Dice, diverticulosis only   . DILATION AND CURETTAGE OF UTERUS    . ESOPHAGOGASTRODUODENOSCOPY  05-13-11   per Dr. Arlyce Dice, normal   . HEMORRHOID SURGERY    . PACEMAKER INSERTION  09/2011  . PERMANENT PACEMAKER INSERTION N/A 09/30/2011   Procedure: PERMANENT PACEMAKER INSERTION;  Surgeon: Marinus Maw, MD;  Location: [facility] CATH LAB;  Service: Cardiovascular;  Laterality: N/A;    OB History    Gravida Para Term Preterm AB Living             3   SAB TAB Ectopic Multiple Live Births                   Home Medications    Prior to Admission medications   Medication Sig Start Date End Date Taking? Authorizing Provider  acetaminophen (TYLENOL) 500 MG tablet Take 1,000 mg by mouth every 6 (six) hours as needed for moderate pain (pain). For pain    [provider]  famotidine (PEPCID) 20 MG tablet TAKE  ONE TABLET BY MOUTH TWICE DAILY 03/21/17   Gordy SaversKwiatkowski, Peter F, MD  ferrous sulfate 325 (65 FE) MG tablet Take 1 tablet (325 mg total) by mouth daily with breakfast. 05/11/15   Gordy SaversKwiatkowski, Peter F, MD  furosemide (LASIX) 20 MG tablet Take 1 tablet (20 mg total) by mouth daily. 06/17/17   Gordy SaversKwiatkowski, Peter F, MD  lisinopril (PRINIVIL,ZESTRIL) 10 MG tablet Take 1 tablet (10 mg total) by mouth daily. 06/17/17   Gordy SaversKwiatkowski, Peter F, MD  LORazepam (ATIVAN) 0.5 MG tablet TAKE 1 TABLET BY MOUTH THREE TIMES DAILY 08/05/17   Gordy SaversKwiatkowski, Peter F, MD  Multiple Vitamin (MULTIVITAMIN WITH MINERALS) TABS tablet Take 1 tablet by mouth daily.    [provider]  sertraline (ZOLOFT) 50 MG tablet Take 1 tablet (50 mg total) by mouth daily. 09/23/16    Gordy SaversKwiatkowski, Peter F, MD  simvastatin (ZOCOR) 40 MG tablet TAKE ONE TABLET BY MOUTH ONCE DAILY AT BEDTIME 04/02/17   Gordy SaversKwiatkowski, Peter F, MD  traMADol (ULTRAM) 50 MG tablet TAKE 1 TABLET BY MOUTH EVERY 8 HOURS AS NEEDED FOR PAIN 07/21/17   Gordy SaversKwiatkowski, Peter F, MD    Family History Family History  Problem Relation Age of Onset  . Unexplained death Mother   . Brain cancer Father        brain tumor  . Colon cancer Sister   . Colitis Brother   . Heart attack Brother     Social History Social History   Tobacco Use  . Smoking status: Never Smoker  . Smokeless tobacco: Never Used  Substance Use Topics  . Alcohol use: No  . Drug use: No     Allergies   Amoxicillin and Penicillins   Review of Systems Review of Systems 10 Systems reviewed and are negative for acute change except as noted in the HPI.   Physical Exam Updated Vital Signs BP (!) 152/117   Pulse 91   Temp (!) 97.4 F (36.3 C) (Oral)   Resp 18   Ht 5\' 5"  (1.651 m)   Wt 72.6 kg (160 lb)   SpO2 98%   BMI 26.63 kg/m   Physical Exam  Constitutional: She appears well-developed and well-nourished. No distress.  HENT:  Head: Normocephalic and atraumatic.  Mouth/Throat: Oropharynx is clear and moist.  Eyes: Conjunctivae and EOM are normal. Pupils are equal, round, and reactive to light.  Neck: Neck supple.  Cardiovascular: Normal rate and regular rhythm.  No murmur heard. Pulmonary/Chest: Effort normal and breath sounds normal. No respiratory distress.  Abdominal: Soft. She exhibits no distension. There is no tenderness.  Musculoskeletal: She exhibits no edema or tenderness.  Neurological: She is alert. No cranial nerve deficit. She exhibits normal muscle tone. Coordination normal.  Skin: Skin is warm and dry.  Psychiatric: She has a normal mood and affect.  Nursing note and vitals reviewed.    ED Treatments / Results  Labs (all labs ordered are listed, but only abnormal results are displayed) Labs  Reviewed  COMPREHENSIVE METABOLIC PANEL - Abnormal; Notable for the following components:      Result Value   Glucose, Bld 128 (*)    BUN 21 (*)    Creatinine, Ser 1.01 (*)    ALT 13 (*)    GFR calc non Af Amer 47 (*)    GFR calc Af Amer 54 (*)    All other components within normal limits  CBC WITH DIFFERENTIAL/PLATELET - Abnormal; Notable for the following components:   WBC 14.6 (*)  Neutro Abs 12.1 (*)    All other components within normal limits  URINALYSIS, ROUTINE W REFLEX MICROSCOPIC - Abnormal; Notable for the following components:   Hgb urine dipstick SMALL (*)    Leukocytes, UA MODERATE (*)    Bacteria, UA RARE (*)    Squamous Epithelial / LPF 0-5 (*)    All other components within normal limits  URINE CULTURE  I-STAT TROPONIN, ED    EKG  EKG Interpretation None       Radiology Dg Chest 2 View  Result Date: 08/19/2017 CLINICAL DATA:  History of dimension. Increased confusion. Onset of mid chest pain today. History of hyperlipidemia, diabetes, hypertension common nonsmoker. EXAM: CHEST  2 VIEW COMPARISON:  Chest x-ray of November 20, 2015 FINDINGS: The lungs arm adequately inflated. There is no focal infiltrate. The cardiac silhouette is enlarged. The pulmonary vascularity is normal. The mediastinum is normal in width. There is calcification in the wall of the thoracic aorta. The ICD is in stable position. There is multilevel degenerative disc disease of the thoracic spine. IMPRESSION: Mild cardiomegaly without pulmonary edema. No acute pneumonia nor other acute cardiopulmonary abnormality. Thoracic aortic atherosclerosis. Electronically Signed   By: David  SwazilandJordan M.D.   On: 08/19/2017 10:12    Procedures Procedures (including critical care time)  Medications Ordered in ED Medications  cefTRIAXone (ROCEPHIN) 1 g in dextrose 5 % 50 mL IVPB (0 g Intravenous Stopped 08/19/17 1259)     Initial Impression / Assessment and Plan / ED Course  I have reviewed the triage  vital signs and the nursing notes.  Pertinent labs & imaging results that were available during my care of the patient were reviewed by me and considered in my medical decision making (see chart for details).    Consult: [facility] Dr. Wyonia HoughGerghe  for admission. Final Clinical Impressions(s) / ED Diagnoses   Final diagnoses:  Acute cystitis without hematuria  Agitation   Patient brought to the emergency department with complaints of agitation and confusion.  Unfortunately, since the patient's son has not given us direct information it is unclear how acute this has been.  The patient does have findings consistent of UTI with leukocytes in the urine, leuk esterase and leukocytosis on CBC.  Patient has been agitated in the emergency department.  At times she is calm and interactive but at other times becomes quite verbal and agitated.  Is unclear if this is an exacerbation of baseline dementia or manifestation of deteriorated mental status due to UTI and acute infection.  Unfortunately as stated additional history is not available at this time.  Plan will be to admit the patient and treat for UTI and determine if mental status improves.  At that time definitive placement can be reassessed for SNF placement versus home. ED Discharge Orders    None       Arby BarrettePfeiffer, Hiroyuki Ozanich, MD 08/19/17 1402    Arby BarrettePfeiffer, Franceen Erisman, MD 08/19/17 1416    Arby BarrettePfeiffer, Taris Galindo, MD 08/19/17 [phone]

## 2017-08-19 NOTE — ED Notes (Signed)
Patient trying to get up out of bed. Patient redirected and given a sandwich and water.

## 2017-08-19 NOTE — ED Notes (Signed)
Sitter at bedside.

## 2017-08-19 NOTE — ED Notes (Signed)
Tried to contact son. No answer. Patient very agitated after PT left, stating she wants to go with them. Dr. Gerri LinsPeiffer aware.

## 2017-08-19 NOTE — Progress Notes (Addendum)
CSW received consult for pt's son wanting to speak withCSW. CSW called [name] at number provided but no answer at this time. CSW will continues to reach out to pt's son.     Claude MangesKierra S. Teriana Danker, MSW, LCSW-A Emergency Department Clinical Social Worker [phone]

## 2017-08-19 NOTE — [place] FL2 (Signed)
MEDICAID FL2 LEVEL OF CARE SCREENING TOOL     IDENTIFICATION  Patient Name: [name] Birthdate: August 27, 1923 Sex: female Admission Date (Current Location): 08/19/2017  [facility] [place]   Facility and Address:  [facility], [address], [place], [place] [address]      Provider Number: [id number]  Attending Physician Name and Address:  Leatha GildingGherghe, Costin M, MD  Relative Name and Phone Number:       Current Level of Care: Hospital Recommended Level of Care: Skilled Nursing Facility Prior Approval Number:    Date Approved/Denied:   PASRR Number:   [id number]   Discharge Plan: SNF    Current Diagnoses: Patient Active Problem List   Diagnosis Date Noted  . UTI (urinary tract infection) 08/19/2017  . Dementia 08/19/2017  . Anxiety 10/27/2012  . Symptomatic bradycardia 10/01/2011  . Status post cardiac pacemaker procedure 10/01/2011  . Atrial fibrillation (HCC) 09/29/2011  . Pain disorder 04/19/2011  . Chest pain, atypical 02/20/2011  . DEPRESSIVE DISORDER 09/19/2010  . LOSS OF WEIGHT 05/30/2010  . ANEMIA, DEFICIENCY, HX OF 03/01/2010  . INSOMNIA 11/23/2008  . Dyslipidemia 03/17/2008  . Diabetes mellitus without complication (HCC) 05/18/2007  . Essential hypertension 05/18/2007  . GERD 05/18/2007    Orientation RESPIRATION BLADDER Height & Weight     Self  Normal Incontinent Weight: 160 lb (72.6 kg) Height:  5\' 5"  (165.1 cm)  BEHAVIORAL SYMPTOMS/MOOD NEUROLOGICAL BOWEL NUTRITION STATUS      Incontinent Diet(please see discharge summary. )  AMBULATORY STATUS COMMUNICATION OF NEEDS Skin   Extensive Assist   Normal                       Personal Care Assistance Level of Assistance  Bathing, Feeding, Dressing Bathing Assistance: Maximum assistance Feeding assistance: Limited assistance Dressing Assistance: Maximum assistance     Functional Limitations Info  Sight, Hearing, Speech Sight  Info: Adequate Hearing Info: Adequate Speech Info: Adequate    SPECIAL CARE FACTORS FREQUENCY  PT (By licensed PT), OT (By licensed OT)     PT Frequency: 5 times a week  OT Frequency: 5 times a week             Contractures Contractures Info: Not present    Additional Factors Info  Code Status, Allergies Code Status Info: Prior  Allergies Info: Amoxicillin, Penicillins           Current Medications (08/19/2017):  This is the current hospital active medication list No current facility-administered medications for this encounter.    Current Outpatient Medications  Medication Sig Dispense Refill  . acetaminophen (TYLENOL) 500 MG tablet Take 1,000 mg by mouth every 6 (six) hours as needed for moderate pain (pain). For pain    . famotidine (PEPCID) 20 MG tablet TAKE ONE TABLET BY MOUTH TWICE DAILY 180 tablet 1  . ferrous sulfate 325 (65 FE) MG tablet Take 1 tablet (325 mg total) by mouth daily with breakfast.  3  . furosemide (LASIX) 20 MG tablet Take 1 tablet (20 mg total) by mouth daily. 90 tablet 0  . lisinopril (PRINIVIL,ZESTRIL) 10 MG tablet Take 1 tablet (10 mg total) by mouth daily. 90 tablet 3  . LORazepam (ATIVAN) 0.5 MG tablet TAKE 1 TABLET BY MOUTH THREE TIMES DAILY 90 tablet 0  . Multiple Vitamin (MULTIVITAMIN WITH MINERALS) TABS tablet Take 1 tablet by mouth daily.    . sertraline (ZOLOFT) 50 MG tablet Take  1 tablet (50 mg total) by mouth daily. 90 tablet 1  . simvastatin (ZOCOR) 40 MG tablet TAKE ONE TABLET BY MOUTH ONCE DAILY AT BEDTIME 90 tablet 1  . traMADol (ULTRAM) 50 MG tablet TAKE 1 TABLET BY MOUTH EVERY 8 HOURS AS NEEDED FOR PAIN 90 tablet 0     Discharge Medications: Please see discharge summary for a list of discharge medications.  Relevant Imaging Results:  Relevant Lab Results:   Additional Information SSN- [id number]  Robb MatarKierra S Vipul Cafarelli, LCSWA

## 2017-08-20 DIAGNOSIS — I4891 Unspecified atrial fibrillation: Secondary | ICD-10-CM | POA: Diagnosis present

## 2017-08-20 DIAGNOSIS — E119 Type 2 diabetes mellitus without complications: Secondary | ICD-10-CM

## 2017-08-20 DIAGNOSIS — Z950 Presence of cardiac pacemaker: Secondary | ICD-10-CM | POA: Diagnosis not present

## 2017-08-20 DIAGNOSIS — G9341 Metabolic encephalopathy: Secondary | ICD-10-CM | POA: Diagnosis present

## 2017-08-20 DIAGNOSIS — I10 Essential (primary) hypertension: Secondary | ICD-10-CM | POA: Diagnosis present

## 2017-08-20 DIAGNOSIS — H409 Unspecified glaucoma: Secondary | ICD-10-CM | POA: Diagnosis present

## 2017-08-20 DIAGNOSIS — R451 Restlessness and agitation: Secondary | ICD-10-CM

## 2017-08-20 DIAGNOSIS — Z66 Do not resuscitate: Secondary | ICD-10-CM | POA: Diagnosis present

## 2017-08-20 DIAGNOSIS — N3000 Acute cystitis without hematuria: Secondary | ICD-10-CM | POA: Diagnosis not present

## 2017-08-20 DIAGNOSIS — F419 Anxiety disorder, unspecified: Secondary | ICD-10-CM | POA: Diagnosis present

## 2017-08-20 DIAGNOSIS — Z880 Allergy status to penicillin: Secondary | ICD-10-CM | POA: Diagnosis not present

## 2017-08-20 DIAGNOSIS — D649 Anemia, unspecified: Secondary | ICD-10-CM | POA: Diagnosis present

## 2017-08-20 DIAGNOSIS — N390 Urinary tract infection, site not specified: Secondary | ICD-10-CM | POA: Diagnosis present

## 2017-08-20 DIAGNOSIS — I480 Paroxysmal atrial fibrillation: Secondary | ICD-10-CM | POA: Diagnosis not present

## 2017-08-20 DIAGNOSIS — K219 Gastro-esophageal reflux disease without esophagitis: Secondary | ICD-10-CM | POA: Diagnosis present

## 2017-08-20 DIAGNOSIS — E785 Hyperlipidemia, unspecified: Secondary | ICD-10-CM | POA: Diagnosis present

## 2017-08-20 DIAGNOSIS — F0391 Unspecified dementia with behavioral disturbance: Secondary | ICD-10-CM | POA: Diagnosis not present

## 2017-08-20 DIAGNOSIS — F0390 Unspecified dementia without behavioral disturbance: Secondary | ICD-10-CM | POA: Diagnosis present

## 2017-08-20 LAB — URINE CULTURE: Culture: <10000 — AB

## 2017-08-20 MED ORDER — HYDRALAZINE HCL 20 MG/ML IJ SOLN: 10.0000 mg | INTRAMUSCULAR | Status: DC | PRN

## 2017-08-20 NOTE — Progress Notes (Signed)
PROGRESS NOTE  Cindy ComasLucille M [name]  MVH:[id number] DOB: 05/17/1923 DOA: 08/19/2017 PCP: Cindy SaversKwiatkowski, Peter F, MD   Brief Narrative: [name] is a 83 y.o. female with medical history significant of advanced dementia, atrial fibrillation status post pacemaker, hypertension, diet-controlled diabetes mellitus, hyperlipidemia, is being brought to the hospital by patient's son due to increased confusion and agitation this morning.  At baseline, patient has advanced dementia however is very calm, able to ambulate some in the home, and occasionally goes out with her son to restaurant.  Starting early this morning, per son around 5 AM, when she woke up she was very agitated, crying, and extremely confused.  He was concerned about her agitation and impulsiveness and was afraid that she may fall at home, and brought the patient to the emergency room for evaluation.  In the ED she seems to be very upset, confused, and appears agitated.  She cannot contribute significantly to the story.  In the ED she is afebrile, normotensive, satting well on room air.  Blood work reveals a leukocytosis with a white count of 14, creatinine of 1.0, and UA slightly positive for moderate leukocytes, bacteria present as well as white cell counts.  Given sudden change in mental status, leukocytosis and slightly positive UA for felt like she has a UTI and we were asked to admit.  Assessment & Plan: Active Problems:   Diabetes mellitus without complication (HCC)   Essential hypertension   Atrial fibrillation (HCC)   Status post cardiac pacemaker procedure   UTI (urinary tract infection)   Dementia  UTI:  - Continue empiric CTX - Monitor urine culture - Monitor leukocytosis in AM  Acute metabolic encephalopathy: Suspect due to infection.  - Monitor with treatment - Continue sitter for safety  Dementia: Chronic. No behavioral disturbance at baseline. - Haldol/ativan prn  HTN: Chronic, stable, slightly up.  - Holding  lasix due to infection with gentle fluids, not overloaded.  - Restart lisinopril, creatinine at baseline - IV hydralazine prn severe HTN.  AFib s/p PPM: Stable ECG - Monitor off telemetry  DVT prophylaxis: Lovenox Code Status: DNR Family Communication: Called son, no answer, automated voicemail which did not confirm the owner of the phone so no message left.  Disposition Plan: DC to previous environment pending clinical improvement.   Consultants:   None  Procedures:   None  Antimicrobials:  Ceftriaxone 12/18 >>    Subjective: Remains confused, denies pain. Unsure of mental baseline, though she's been agitated overnight which is not consistent with recorded baseline.   Objective: Vitals:   08/19/17 2126 08/20/17 0406 08/20/17 0535 08/20/17 0557  BP: 131/62 (!) 174/74 (!) 182/79 (!) 165/70  Pulse: 66 65    Resp: 18 16    Temp: 98.2 [name] (36.8 C) 99 [name] (37.2 C)    TempSrc: Oral Oral    SpO2: 99% 96%    Weight:      Height:        Intake/Output Summary (Last 24 hours) at 08/20/2017 1141 Last data filed at 08/20/2017 0600 Gross per 24 hour  Intake 1460 ml  Output -  Net 1460 ml   Filed Weights   08/19/17 0812 08/19/17 1755  Weight: 72.6 kg (160 lb) 69.3 kg (152 lb 12.5 oz)    Gen: Elderly unkempt female in no distress.  Pulm: Non-labored breathing room air. Clear to auscultation bilaterally.  CV: Irreg. No murmur, rub, or gallop. No JVD, no pedal edema. GI: Abdomen soft, non-tender, non-distended, with normoactive bowel  sounds. No organomegaly or masses felt. Ext: Warm, no deformities Skin: No rashes, lesions no ulcers Neuro: Alert, not oriented, not cooperative with exam. Psych: Judgement and insight appear impaired. Mood & affect appropriate.   Data Reviewed: I have personally reviewed following labs and imaging studies  CBC: Recent Labs  Lab 08/19/17 0925  WBC 14.6*  NEUTROABS 12.1*  HGB 13.3  HCT 41.0  MCV 89.7  PLT 252   Basic Metabolic  Panel: Recent Labs  Lab 08/19/17 0925  NA 140  K 4.2  CL 104  CO2 27  GLUCOSE 128*  BUN 21*  CREATININE 1.01*  CALCIUM 9.3   GFR: Estimated Creatinine Clearance: 34.7 mL/min (A) (by C-G formula based on SCr of 1.01 mg/dL (H)). Liver Function Tests: Recent Labs  Lab 08/19/17 0925  AST 17  ALT 13*  ALKPHOS 54  BILITOT 0.8  PROT 8.0  ALBUMIN 4.1   No results for input(s): LIPASE, AMYLASE in the last 168 hours. No results for input(s): AMMONIA in the last 168 hours. Coagulation Profile: No results for input(s): INR, PROTIME in the last 168 hours. Cardiac Enzymes: No results for input(s): CKTOTAL, CKMB, CKMBINDEX, TROPONINI in the last 168 hours. BNP (last 3 results) No results for input(s): PROBNP in the last 8760 hours. HbA1C: No results for input(s): HGBA1C in the last 72 hours. CBG: No results for input(s): GLUCAP in the last 168 hours. Lipid Profile: No results for input(s): CHOL, HDL, LDLCALC, TRIG, CHOLHDL, LDLDIRECT in the last 72 hours. Thyroid Function Tests: No results for input(s): TSH, T4TOTAL, FREET4, T3FREE, THYROIDAB in the last 72 hours. Anemia Panel: No results for input(s): VITAMINB12, FOLATE, FERRITIN, TIBC, IRON, RETICCTPCT in the last 72 hours. Urine analysis:    Component Value Date/Time   COLORURINE YELLOW 08/19/2017 0926   APPEARANCEUR CLEAR 08/19/2017 0926   LABSPEC 1.012 08/19/2017 0926   PHURINE 5.0 08/19/2017 0926   GLUCOSEU NEGATIVE 08/19/2017 0926   HGBUR SMALL (A) 08/19/2017 0926   HGBUR negative 04/19/2010 1422   BILIRUBINUR NEGATIVE 08/19/2017 0926   BILIRUBINUR 1+ 06/24/2012 1447   KETONESUR NEGATIVE 08/19/2017 0926   PROTEINUR NEGATIVE 08/19/2017 0926   UROBILINOGEN 0.2 10/18/2014 2027   NITRITE NEGATIVE 08/19/2017 0926   LEUKOCYTESUR MODERATE (A) 08/19/2017 0926   No results found for this or any previous visit (from the past 240 hour(s)).    Radiology Studies: Dg Chest 2 View  Result Date: 08/19/2017 CLINICAL DATA:   History of dimension. Increased confusion. Onset of mid chest pain today. History of hyperlipidemia, diabetes, hypertension common nonsmoker. EXAM: CHEST  2 VIEW COMPARISON:  Chest x-ray of November 20, 2015 FINDINGS: The lungs arm adequately inflated. There is no focal infiltrate. The cardiac silhouette is enlarged. The pulmonary vascularity is normal. The mediastinum is normal in width. There is calcification in the wall of the thoracic aorta. The ICD is in stable position. There is multilevel degenerative disc disease of the thoracic spine. IMPRESSION: Mild cardiomegaly without pulmonary edema. No acute pneumonia nor other acute cardiopulmonary abnormality. Thoracic aortic atherosclerosis. Electronically Signed   By: David  Swaziland M.D.   On: 08/19/2017 10:12    Scheduled Meds: . enoxaparin (LOVENOX) injection  40 mg Subcutaneous Q24H  . famotidine  20 mg Oral BID  . ferrous sulfate  325 mg Oral Q breakfast  . lisinopril  10 mg Oral Daily  . LORazepam  0.5 mg Oral BID  . sertraline  50 mg Oral Daily  . simvastatin  40 mg Oral q1800  Continuous Infusions: . sodium chloride 100 mL/hr at 08/20/17 0352  . cefTRIAXone (ROCEPHIN)  IV       LOS: 0 days   Time spent: 25 minutes.  Hazeline Junkeryan Zeev Deakins, MD [facility] Pager [phone]  If 7PM-7AM, please contact night-coverage [email or web address] Password [email or web address] 08/20/2017, 11:41 AM

## 2017-08-20 NOTE — Progress Notes (Signed)
Patient BP is elevated this AM. Merdis DelayK. Schorr, NP notified.

## 2017-08-21 ENCOUNTER — Encounter (HOSPITAL_COMMUNITY): Payer: Self-pay

## 2017-08-21 LAB — BASIC METABOLIC PANEL
ANION GAP: 4 — AB (ref 5–15)
BUN: 16 mg/dL (ref 6–20)
CALCIUM: 8.6 mg/dL — AB (ref 8.9–10.3)
CO2: 25 mmol/L (ref 22–32)
Chloride: 112 mmol/L — ABNORMAL HIGH (ref 101–111)
Creatinine, Ser: 0.87 mg/dL (ref 0.44–1.00)
GFR, EST NON AFRICAN AMERICAN: 56 mL/min — AB (ref >60)
GLUCOSE: 97 mg/dL (ref 65–99)
POTASSIUM: 3.6 mmol/L (ref 3.5–5.1)
Sodium: 141 mmol/L (ref 135–145)

## 2017-08-21 LAB — CBC
HEMATOCRIT: 35.7 % — AB (ref 36.0–46.0)
Hemoglobin: 11.2 g/dL — ABNORMAL LOW (ref 12.0–15.0)
MCH: 28.5 pg (ref 26.0–34.0)
MCHC: 31.4 g/dL (ref 30.0–36.0)
MCV: 90.8 fL (ref 78.0–100.0)
PLATELETS: 212 10*3/uL (ref 150–400)
RBC: 3.93 MIL/uL (ref 3.87–5.11)
RDW: 14.2 % (ref 11.5–15.5)
WBC: 10.7 10*3/uL — AB (ref 4.0–10.5)

## 2017-08-21 MED ORDER — TRAMADOL HCL 50 MG PO TABS
50.0000 mg | ORAL_TABLET | Freq: Three times a day (TID) | ORAL | 0 refills | Status: DC | PRN
Start: 2017-08-21 — End: 2017-09-08

## 2017-08-21 MED ORDER — SERTRALINE HCL 50 MG PO TABS: 50.0000 mg | ORAL_TABLET | Freq: Every day | ORAL | 2 refills | Status: DC

## 2017-08-21 MED ORDER — SIMVASTATIN 40 MG PO TABS: 40.0000 mg | ORAL_TABLET | Freq: Every day | ORAL | 2 refills | Status: DC

## 2017-08-21 MED ORDER — FERROUS SULFATE 325 (65 FE) MG PO TABS: 325.0000 mg | ORAL_TABLET | Freq: Every day | ORAL | 3 refills | Status: DC

## 2017-08-21 MED ORDER — FAMOTIDINE 20 MG PO TABS: 20.0000 mg | ORAL_TABLET | Freq: Two times a day (BID) | ORAL | Status: DC

## 2017-08-21 MED ORDER — LORAZEPAM 0.5 MG PO TABS: 0.5000 mg | ORAL_TABLET | Freq: Two times a day (BID) | ORAL | 0 refills | Status: DC

## 2017-08-21 NOTE — Consult Note (Addendum)
   [facility]   08/21/2017  Melody ComasLucille M Standifer 14-Jan-1923 [id number]   [facility] due to high risk for readmission.   Chart reviewed. Noted SNF was declined by insurance.   Spoke with son who was extremely overwhelmed. Was able to discuss [facility] services. He is agreeable and written consent obtained. [facility] provided. Patient is pleasantly confused.   Marily MemosRichard Burnett (son) is primary caregiver at [phone]. PCP is Dr. Amador CunasKwiatkowski. [facility].  Contacted Home Care Providers for additional aide services. They will follow up with Mr. Orson SlickBowman to schedule visit.   Writer spoke with Home Care Providers and faxed necessary paperwork for aide services.   [facility] RNCM, [facility] LCSW with patient's son.   Patient has DM, HTN, UTI, Dementia, HLD.   Made inpatient RNCM aware [facility] to follow.    Raiford NobleAtika Berkley Cronkright, MSN-Ed, RN,BSN [facility] Care [facility] Hospital Liaison [phone]

## 2017-08-21 NOTE — Care Management Note (Signed)
Case Management Note  Patient Details  Name: [name] MRN: [id number] Date of Birth: 03/20/1923  Subjective/Objective:    Pt admitted with UTI               Action/Plan:Pt discharging home with son and [facility] HHPT,NA,SW   Expected Discharge Date:  08/21/17               Expected Discharge Plan:  Home w Home Health Services  In-House Referral:  Clinical Social Work  Discharge planning Services  CM Consult  Post Acute Care Choice:    Choice offered to:     DME Arranged:    DME Agency:     HH Arranged:  PT, Nurse's Aide, Social Work [employer] Agency:  [facility]  Status of Service:  Completed, signed off  If discussed at [employer] Meetings, dates discussed:    Additional CommentsGeni Bers:  Margit Batte, RN 08/21/2017, 3:48 PM

## 2017-08-21 NOTE — Progress Notes (Signed)
Spoke with pt's son in length concerning HH needs. [facility] was selected for HHNA,SW and HHPT. Referral given to in house rep.

## 2017-08-21 NOTE — Progress Notes (Addendum)
CSW [facility] by [facility] that previous CSW had initiated healthteam advantage insurance auth request which was denied. Expressed MD-to-MD authorization appeal could be offered. Updated attending- will reach out to perform peer-to-peer authorization request.  Updated pt's son on status. He expressed understands that if insurance Berkley Harveyauth is denied and private pay is not option, will need to plan for pt to return home. Son advises he feels overwhelmed with pt's care- states he "am all alone with her there, I'm afraid I won't even be able to leave to go to the grocery store." CSW discussed options of hiring aides/utilizing Home Health for therapy needs. Son somewhat receptive. Also explained to son that referrals to SNF have been made but thus far no bed offers.  Awaiting insurance authorization outcome.   Ilean SkillMeghan Makaila Windle, MSW, LCSW Clinical Social Work 08/21/2017 [phone]  Was informed insurance authorization denied following peer-to-peer review. Discussed with son- reiterated options above and pt's son agreeable to Home Health referral and to looking into hiring an aide to assist him with pt's care. CSW encouraged son to consider applying for Medicaid for pt so that if she qualifies and needs long term care in the future, she will have payor source. Sone agreeable.

## 2017-08-21 NOTE — Progress Notes (Signed)
Patient discharged home with son Gerlene BurdockRichard. Discharge instructions reviewed with son Gerlene BurdockRichard. Prescriptions given. Reviewed medication changes and times with patient's son. Wheeled out by Psychologist, sport and exercisenurse tech.

## 2017-08-21 NOTE — Progress Notes (Signed)
CS[facility] and was informed that pt's request had been denied. CSW provided Tammy with [facility] CSW contact information in order to inform CSW of this. At this time. This CSW signing off.     Claude MangesKierra S. Keyanni Whittinghill, MSW, LCSW-A Emergency Department Clinical Social Worker [phone]

## 2017-08-21 NOTE — Discharge Summary (Addendum)
Physician Discharge Summary  Cindy ComasLucille M [name] JYN:[id number] DOB: 1923/01/22 DOA: 08/19/2017  PCP: Gordy SaversKwiatkowski, Peter F, MD  Admit date: 08/19/2017 Discharge date: 08/21/2017  Time spent: 35* minutes  Recommendations for Outpatient Follow-up:  1. follow PCP in two weeks   Discharge Diagnoses:  Active Problems:   Diabetes mellitus without complication (HCC)   Essential hypertension   Atrial fibrillation (HCC)   Status post cardiac pacemaker procedure    Dementia   Discharge Condition: stable  Diet recommendation: heart healthy diet  Filed Weights   08/19/17 0812 08/19/17 1755  Weight: 72.6 kg (160 lb) 69.3 kg (152 lb 12.5 oz)    History of present illness:  83 y.o.femalewith medical history significant ofadvanced dementia, atrial fibrillation status post pacemaker, hypertension, diet-controlled diabetes mellitus, hyperlipidemia, is being brought to the hospital by patient's son due to increased confusion and agitation this morning. At baseline, patient has advanced dementia however is very calm, able to ambulate some in the home, and occasionally goes out with her son to restaurant. Starting early this morning, per son around 5 AM, when she woke up she was very agitated, crying, and extremely confused. He was concerned about her agitation and impulsiveness and was afraid that she may fall at home, and brought the patient to the emergency room for evaluation. In the ED she seems to be very upset, confused, and appears agitated. She cannot contribute significantly to the story.    Hospital Course:   ? UTI-the patient was empirically started on ceftriaxone, urine culture grew less than 10,000 colonies per mL of insignificant growth. Will discontinue antibiotics at this time. Patient WBC is 10,000 this morning. She is afebrile.  Acute metabolic encephalopathy-  resolved, likely chronic from underlying dementia.  Dementia-stable, no behavioral disturbance. At baseline.  Will continue Ativan as scheduled.  Hypertension-blood pressure is stable, continue Lasix, lisinopril  Atrial fibrillation status post pacemaker placement- stable    Procedures:  none  Consultations:  none  Discharge Exam: Vitals:   08/20/17 2018 08/21/17 0400  BP: (!) 155/54 (!) 148/59  Pulse: 64 75  Resp: 18 18  Temp: 99.8 F (37.7 C) 98.9 F (37.2 C)  SpO2: 95% 100%    General: appears in no acute distress Cardiovascular: S1 S2, regular Respiratory: clear to auscultation bilaterally  Discharge Instructions   Allergies as of 08/21/2017      Reactions   Amoxicillin Rash   Penicillins Rash   Has patient had a PCN reaction causing immediate rash, facial/tongue/throat swelling, SOB or lightheadedness with hypotension: unknown Has patient had a PCN reaction causing severe rash involving mucus membranes or skin necrosis: unkonwn Has patient had a PCN reaction that required hospitalization unknown Has patient had a PCN reaction occurring within the last 10 years: No If all of the above ansers are "NO", then may proceed with Cephalosporin use.      Medication List    TAKE these medications   acetaminophen 500 MG tablet Commonly known as:  TYLENOL Take 1,000 mg by mouth every 6 (six) hours as needed for moderate pain (pain). For pain   famotidine 20 MG tablet Commonly known as:  PEPCID Take 1 tablet (20 mg total) by mouth 2 (two) times daily.   ferrous sulfate 325 (65 FE) MG tablet Take 1 tablet (325 mg total) by mouth daily with breakfast. Start taking on:  08/22/2017   furosemide 20 MG tablet Commonly known as:  LASIX Take 1 tablet (20 mg total) by mouth daily.   lisinopril 10  MG tablet Commonly known as:  PRINIVIL,ZESTRIL Take 1 tablet (10 mg total) by mouth daily.   LORazepam 0.5 MG tablet Commonly known as:  ATIVAN Take 1 tablet (0.5 mg total) by mouth 2 (two) times daily. What changed:  when to take this   multivitamin with minerals Tabs  tablet Take 1 tablet by mouth daily.   sertraline 50 MG tablet Commonly known as:  ZOLOFT Take 1 tablet (50 mg total) by mouth daily. Start taking on:  08/22/2017   simvastatin 40 MG tablet Commonly known as:  ZOCOR Take 1 tablet (40 mg total) by mouth daily at 6 PM. What changed:  when to take this   traMADol 50 MG tablet Commonly known as:  ULTRAM Take 1 tablet (50 mg total) by mouth every 8 (eight) hours as needed. for pain        Discharge Instructions    Diet - low sodium heart healthy   Complete by:  As directed    Increase activity slowly   Complete by:  As directed      Allergies  Allergen Reactions  . Amoxicillin Rash  . Penicillins Rash    Has patient had a PCN reaction causing immediate rash, facial/tongue/throat swelling, SOB or lightheadedness with hypotension: unknown Has patient had a PCN reaction causing severe rash involving mucus membranes or skin necrosis: unkonwn Has patient had a PCN reaction that required hospitalization unknown Has patient had a PCN reaction occurring within the last 10 years: No If all of the above ansers are "NO", then may proceed with Cephalosporin use.    Follow-up Information    Gordy Savers, MD Follow up in 2 week(s).   Specialty:  Internal Medicine Contact information: [address] [place] [address] [phone]            The results of significant diagnostics from this hospitalization (including imaging, microbiology, ancillary and laboratory) are listed below for reference.    Significant Diagnostic Studies: Dg Chest 2 View  Result Date: 08/19/2017 CLINICAL DATA:  History of dimension. Increased confusion. Onset of mid chest pain today. History of hyperlipidemia, diabetes, hypertension common nonsmoker. EXAM: CHEST  2 VIEW COMPARISON:  Chest x-ray of November 20, 2015 FINDINGS: The lungs arm adequately inflated. There is no focal infiltrate. The cardiac silhouette is enlarged. The pulmonary  vascularity is normal. The mediastinum is normal in width. There is calcification in the wall of the thoracic aorta. The ICD is in stable position. There is multilevel degenerative disc disease of the thoracic spine. IMPRESSION: Mild cardiomegaly without pulmonary edema. No acute pneumonia nor other acute cardiopulmonary abnormality. Thoracic aortic atherosclerosis. Electronically Signed   By: David  Swaziland M.D.   On: 08/19/2017 10:12    Microbiology: Recent Results (from the past 240 hour(s))  Urine culture     Status: Abnormal   Collection Time: 08/19/17  9:26 AM  Result Value Ref Range Status   Specimen Description URINE, CLEAN CATCH  Final   Special Requests NONE  Final   Culture (A)  Final    <10,000 COLONIES/mL INSIGNIFICANT GROWTH Performed at [facility], [address], [place], [place] [address]    Report Status 08/20/2017 FINAL  Final     Labs: Basic Metabolic Panel: Recent Labs  Lab 08/19/17 0925 08/21/17 0347  NA 140 141  K 4.2 3.6  CL 104 112*  CO2 27 25  GLUCOSE 128* 97  BUN 21* 16  CREATININE 1.01* 0.87  CALCIUM 9.3 8.6*  Liver Function Tests: Recent Labs  Lab 08/19/17 0925  AST 17  ALT 13*  ALKPHOS 54  BILITOT 0.8  PROT 8.0  ALBUMIN 4.1   No results for input(s): LIPASE, AMYLASE in the last 168 hours. No results for input(s): AMMONIA in the last 168 hours. CBC: Recent Labs  Lab 08/19/17 0925 08/21/17 0347  WBC 14.6* 10.7*  NEUTROABS 12.1*  --   HGB 13.3 11.2*  HCT 41.0 35.7*  MCV 89.7 90.8  PLT 252 212       Signed:  Meredeth IdeGagan S Kalissa Grays MD.  [facility] 08/21/2017, 1:19 PM

## 2017-08-21 NOTE — Progress Notes (Signed)
Physical Therapy Treatment Patient Details Name: Cindy ComasLucille M Nazario MRN: [id number] DOB: 11-17-1922 Today's Date: 08/21/2017    History of Present Illness Pt was admitted with increased agitation and confusion. Dx of UTI.   H/O dementia    PT Comments    Pt ambulated 26110' with hand held assist, loss of balance x 1. She is tolerating increased activity level. No agitation noted this session. Pt is confused but can follow commands.   Follow Up Recommendations  SNF     Equipment Recommendations  None recommended by PT    Recommendations for Other Services       Precautions / Restrictions Precautions Precautions: Fall Precaution Comments: dementia Restrictions Weight Bearing Restrictions: No    Mobility  Bed Mobility   Bed Mobility: Supine to Sit;Sit to Supine     Supine to sit: Supervision Sit to supine: Min assist   General bed mobility comments: min A for LEs into bed  Transfers Overall transfer level: Needs assistance Equipment used: 1 person hand held assist Transfers: Sit to/from Stand Sit to Stand: Min guard         General transfer comment: min guard for safety  Ambulation/Gait Ambulation/Gait assistance: Min assist;Min guard Ambulation Distance (Feet): 110 Feet Assistive device: 1 person hand held assist Gait Pattern/deviations: Step-through pattern;Staggering left   Gait velocity interpretation: at or above normal speed for age/gender General Gait Details: steadying assist for loss of balance x 1   Stairs            Wheelchair Mobility    Modified Rankin (Stroke Patients Only)       Balance Overall balance assessment: Needs assistance Sitting-balance support: Feet supported;Bilateral upper extremity supported Sitting balance-Leahy Scale: Fair       Standing balance-Leahy Scale: Poor Standing balance comment: hand held assistance for balance                            Cognition Arousal/Alertness:  Awake/alert Behavior During Therapy: WFL for tasks assessed/performed Overall Cognitive Status: No family/caregiver present to determine baseline cognitive functioning                                 General Comments: h/o dementia, can follow commands      Exercises      General Comments        Pertinent Vitals/Pain Pain Assessment: Faces Faces Pain Scale: No hurt    Home Living                      Prior Function            PT Goals (current goals can now be found in the care plan section) Acute Rehab PT Goals Patient Stated Goal: none stated PT Goal Formulation: Patient unable to participate in goal setting Time For Goal Achievement: 09/02/17 Potential to Achieve Goals: Fair Progress towards PT goals: Progressing toward goals    Frequency    Min 2X/week      PT Plan Current plan remains appropriate    Co-evaluation              AM-PAC PT "6 Clicks" Daily Activity  Outcome Measure  Difficulty turning over in bed (including adjusting bedclothes, sheets and blankets)?: A Little Difficulty moving from lying on back to sitting on the side of the bed? : A Little Difficulty sitting down on and  standing up from a chair with arms (e.g., wheelchair, bedside commode, etc,.)?: A Little Help needed moving to and from a bed to chair (including a wheelchair)?: A Little Help needed walking in hospital room?: A Little Help needed climbing 3-5 steps with a railing? : A Lot 6 Click Score: 17    End of Session Equipment Utilized During Treatment: Gait belt Activity Tolerance: Patient tolerated treatment well Patient left: in bed;with call bell/phone within reach;with nursing/sitter in room Nurse Communication: Mobility status PT Visit Diagnosis: Difficulty in walking, not elsewhere classified (R26.2)     Time: [id number] PT Time Calculation (min) (ACUTE ONLY): 11 min  Charges:  $Gait Training: 8-22 mins                    G Codes:           Tamala SerUhlenberg, Mari Battaglia Kistler 08/21/2017, 12:05 PM [phone]

## 2017-08-22 ENCOUNTER — Encounter: Payer: Self-pay | Admitting: *Deleted

## 2017-08-22 ENCOUNTER — Telehealth: Payer: Self-pay | Admitting: Internal Medicine

## 2017-08-22 ENCOUNTER — Telehealth: Payer: Self-pay | Admitting: Family Medicine

## 2017-08-22 NOTE — Telephone Encounter (Signed)
Called [name] back- he did get call from DSS agent- he states his mother is past all the help that they can offer or she did not respond well to the past programs tried. He is trying to get pharmacies changed now. [facility] but he seemed busy and wanted to get off phone. I will continue to look into community resources for him.

## 2017-08-22 NOTE — Telephone Encounter (Signed)
Looks like this is going to be a hospital follow up call also.

## 2017-08-22 NOTE — Telephone Encounter (Signed)
This is a duplicate note see previous one dated 08/22/2017.

## 2017-08-22 NOTE — Telephone Encounter (Signed)
Transition Care Management Follow-up Telephone Call  Admit date: 08/19/2017 Discharge date: 08/21/2017  Time spent: 35* minutes  Recommendations for Outpatient Follow-up:  1. follow PCP in two weeks   Discharge Diagnoses:  Active Problems:   Diabetes mellitus without complication (HCC)   Essential hypertension   Atrial fibrillation (HCC)   Status post cardiac pacemaker procedure    Dementia   Discharge Condition: stable  I spoke with son Gerlene BurdockRichard, [facility] is [facility] the [facility] and was able to answer all of the below questions.     How have you been since you were released from the hospital? "per son Gerlene BurdockRichard, his mom's dementia is worsening "   Do you understand why you were in the hospital? yes   Do you understand the discharge instructions? yes   Where were you discharged to? Home   Items Reviewed:  Medications reviewed: yes  Allergies reviewed: yes  Dietary changes reviewed: yes  Referrals reviewed: yes   Functional Questionnaire:   Activities of Daily Living (ADLs):   She states they are independent in the following: ambulation, bathing and hygiene, feeding, continence, grooming, toileting and dressing States they require assistance with the following: none   Any transportation issues/concerns?: no   Any patient concerns? Per son, mom's dementia is worsening.    Confirmed importance and date/time of follow-up visits scheduled no  Provider Appointment booked with declined at this time, has upcoming evaluation for home care on 08/27/2017.  Confirmed with patient if condition begins to worsen call PCP or go to the ER.  Patient was given the office number and encouraged to call back with question or concerns.  : yes

## 2017-08-22 NOTE — Telephone Encounter (Signed)
I spoke with son and did a TCM note. Pt has evaluation on 08/27/2017 with a home care agency.

## 2017-08-22 NOTE — Telephone Encounter (Signed)
Patient's son is calling with concerns- he is concerned about his mother and her worsening dementia. She is calling him different names, she is only able to make it to the bath room, she is able to feed herself. She has recently been admitted to the hospital for UTI and she is back home know. The concern is that Gerlene BurdockRichard is her only care giver and he has no other help taking care of her. He reports he is not sleeping and he is exhausted. He is desperate for help. He is going to reach out to [facility] today- which is an agency that the hospital told him about. I notified the PCP and they suggested that I contact DSS. Spoke with Mellissa Kohutandra Marcus at DSS and she is going to reach out to the son and see if she can offer some community help and see if he is in need of services. ( Adult abuse line [phone]- they do more than handle abuse) Will call [name] later today to check on him.

## 2017-08-25 ENCOUNTER — Other Ambulatory Visit: Payer: Self-pay | Admitting: *Deleted

## 2017-08-25 ENCOUNTER — Encounter: Payer: Self-pay | Admitting: *Deleted

## 2017-08-25 NOTE — Patient Outreach (Signed)
[facility](THN) Care Management  08/25/2017  Cindy ComasLucille M [name] 1923/08/01 [id number]    Initial outreach attempt unsuccessful. Will continue attempts to additional numbers in Epic for transition of care contacts. Will scheduled another follow up this week.  Elliot CousinLisa Edie Vallandingham, RN Care Management Coordinator [facility] Main Office [phone]

## 2017-08-25 NOTE — Patient Outreach (Signed)
[facility](THN) Care Management  08/25/2017  Cindy ComasLucille M [name] [birth year] [id number]    RN attempted another outreach call to the home contact number [phone] and left a HIPAA approved voice message requesting a call back. Will continue outreach calls accordingly for pending services. Note earlier call to the son Marily MemosRichard Elk [phone] is not a valid number that is working. Will alert hospital liaison on pt's disposition and continue with transition of care calls.   Elliot CousinLisa Zakyia Gagan, RN Care Management Coordinator [facility] Main Office [phone]

## 2017-08-25 NOTE — Telephone Encounter (Signed)
Pt is now on the schedule to see Dr. Kirtland BouchardK for hospital follow up on 09/04/2016 Wednesday at 4 pm.

## 2017-08-27 ENCOUNTER — Other Ambulatory Visit: Payer: Self-pay | Admitting: Internal Medicine

## 2017-08-27 ENCOUNTER — Other Ambulatory Visit: Payer: Self-pay | Admitting: Pharmacist

## 2017-08-27 ENCOUNTER — Other Ambulatory Visit: Payer: Self-pay | Admitting: *Deleted

## 2017-08-27 NOTE — Patient Outreach (Signed)
[facility](THN) Care Management  08/27/2017  Melody ComasLucille M Pettey [birth year] [id number]    Transition of care (2nd outreach)  RN attempted outreach call today to both the son Marily MemosRichard Hobson [phone] invalid contact. RN also outreached to the pt's home # [phone] however only able to leave a HIPAA approved voice message requesting a call back. Will inquirer further on pt's needs that time. Will rescheduled another call tomorrow for [facility].  Elliot CousinLisa Jacquez Sheetz, RN Care Management Coordinator [facility] Main Office [phone]

## 2017-08-27 NOTE — Patient Outreach (Signed)
[facility](THN) Care Management  08/27/2017  Melody ComasLucille M [name] 1923-06-03 [id number]    Transition of care  RN spoke with consented caregiver son ([name]) who verified identifiers of this pt due to her Dementia. RN introduced the [facility] program and services along with community resource for possible referrals. Caregiver indicated he is overwhelm and indicated he is not sure what he needs for the patient. RN begin discussion on possible community resources for the following:  TRANSPORTATION: Introduced SATS services with [employer] services however caregiver declined indicating he had sufficient transportation services.  FOOD: Introduced [employer] for possible referral for meals to be delivered as assist caregiver with this pt however RN made caregiver aware that there maybe a waiting list or services my not be offered in his area. HHEALTH: RN inquired on [facility] and [facility] who has an appointment today at 1:30pm referred by the hospital liaison [name]([name]).  Agency arrived as Charity fundraiserN was on the phone with the caregiver.  Prior to ending the call caregiver was able to finish the transition of care template and has verified the pt's medication lists. Due to pt's appointment with the agency today RN offered to follow up with another call. A time was discussed and RN will rescheduled follow up call for this Friday to further engage with possible needs.  Note [facility] contacted Raiford Nobletika Hall (hospital liaison) concerning the meeting today for pt with the caregiver who was in need of some resources for his own care. Raiford NobleAtika Hall (hospital liaison) was able to provider some resources to the agency however such resources were for the caregiver not the pt.  RN received an update and requested hospital liaison to refer all calls directly to this community case manager for future needs concerning the referring pt. RN will update the referred [facility] social worker [name]([name])  and inquire on possibly making an earlier call to caregiver for possible resources for this pt.   Will follow up accordingly as appointment will be scheduled.  Patient was recently discharged from hospital and all medications have been reviewed.  Elliot CousinLisa Miko Sirico, RN Care Management Coordinator [facility] Main Office [phone]

## 2017-08-27 NOTE — Patient Outreach (Addendum)
[facility](THN) Care Management  [facility]   08/27/2017  Cindy ComasLucille M [name] 1923-04-10 [id number]  Subjective: Patient's son was called regarding post discharge 30-day medication review.  HIPAA identifiers were obtained.  Patient is a 83 year old female with multiple medical conditions including but not limited to:  Anemia, Afib status post pacemaker placement, dementia, depression, hypertension, hyperlipidemia, and diet-controlled diabetes.   Patient was recently hospitalized for increased confusion and agitation.  Patient's [name]([name]) manages her medications and expressed some caregiver stress.  Objective:   Encounter Medications: Outpatient Encounter Medications as of 08/27/2017  Medication Sig Note  . acetaminophen (TYLENOL) 500 MG tablet Take 1,000 mg by mouth every 6 (six) hours as needed for moderate pain (pain). For pain 08/27/2017: PRN Only  . famotidine (PEPCID) 20 MG tablet Take 1 tablet (20 mg total) by mouth 2 (two) times daily.   . Ferrous Sulfate ([employer]S SLOW RELEASE IRON PO) Take 1 tablet by mouth daily.   . furosemide (LASIX) 20 MG tablet Take 1 tablet (20 mg total) by mouth daily. 08/19/2017: Lf 06/17/17 #90  . lisinopril (PRINIVIL,ZESTRIL) 10 MG tablet Take 1 tablet (10 mg total) by mouth daily. 08/19/2017: Lf 06/17/17 #90  . LORazepam (ATIVAN) 0.5 MG tablet Take 1 tablet (0.5 mg total) by mouth 2 (two) times daily.   . sertraline (ZOLOFT) 50 MG tablet Take 1 tablet (50 mg total) by mouth daily.   . simvastatin (ZOCOR) 40 MG tablet Take 1 tablet (40 mg total) by mouth daily at 6 PM.   . traMADol (ULTRAM) 50 MG tablet Take 1 tablet (50 mg total) by mouth every 8 (eight) hours as needed. for pain   . [DISCONTINUED] ferrous sulfate 325 (65 FE) MG tablet Take 1 tablet (325 mg total) by mouth daily with breakfast.   . [DISCONTINUED] Multiple Vitamin (MULTIVITAMIN WITH MINERALS) TABS tablet Take 1 tablet by mouth daily.    No facility-administered  encounter medications on file as of 08/27/2017.     Functional Status: In your present state of health, do you have any difficulty performing the following activities: 08/19/2017  Hearing? Y  Vision? Y  Difficulty concentrating or making decisions? Y  Walking or climbing stairs? Y  Dressing or bathing? Y  Doing errands, shopping? Y  Some recent data might be hidden    Fall/Depression Screening: Fall Risk  08/15/2015 08/02/2014 06/15/2013  Falls in the past year? No No No  Risk for fall due to : - - Impaired balance/gait;Impaired mobility   PHQ 2/9 Scores 08/15/2015 08/02/2014 06/15/2013 05/06/2013  PHQ - 2 Score 0 0 0 0      Assessment: ASSESSMENT: Date Discharged from Hospital: 08/21/17 Date Medication Reconciliation Performed: 08/27/2017  No new medications were prescribed at discharge.  Patient was recently discharged from hospital and all medications have been reviewed   Drugs sorted by system:  Neurologic/Psychologic: Lorazepam  Sertraline  Cardiovascular: Simvastatin Furosemide Lisinopril  Gastrointestinal: Famotidine  Pain: Acetaminophen Tramadol  Vitamins/Minerals: Slow Release Ferrous Sulfate  Medication Review Findings:  Adherence: Furosemide-patient's son reported they have misplaced the furosemide prescription bottle.  The patient's pharmacy was called and they attempted to refill the furosemide which was last filled 06/18/17 for a 90 day supply but it did not have refills. The technician said they would request a refill from Dr. Vernon PreyKwiatkowski's office.  Lorazepam-patient's son reported giving the patient 1 tablet three times daily.  The dose was changed at discharge to Lorazepam 0.5mg  1 tablet twice daily.  Patient's son was advised of the correct dose.   PLAN: -Follow up on Furosemide refill and the cost -Route note to Dr. Amador CunasKwiatkowski -Follow up with patient's son on the furosemide. -Note will be sent to patient's social worker, Cindy BadJoanna  [name] due to the son's caregiver stress.  Beecher McardleKatina J. Catherine Oak, PharmD, BCACP [facility] Clinical Pharmacist [phone]

## 2017-08-27 NOTE — Patient Outreach (Addendum)
Received notification from [place], RN with [facility] who was at Mrs.Nembhard's home for an assessment. Upon Holt's home visit, patient's son/caregiver, Marily MemosRichard Dobkins, was very overwhelmed. Leonor LivHolt provided resources to patient's son to assist him as well.  Provided Leonor LivHolt with Community [facility] RNCM contact information as well.  Provided update to [facility] Community to [facility] RNCM and [facility] LCSW.   Raiford NobleAtika Hall, MSN-Ed, RN,BSN [facility] Care Management Hospital Liaison [phone]

## 2017-08-28 ENCOUNTER — Other Ambulatory Visit: Payer: Self-pay | Admitting: *Deleted

## 2017-08-28 ENCOUNTER — Encounter: Payer: Self-pay | Admitting: *Deleted

## 2017-08-28 ENCOUNTER — Other Ambulatory Visit: Payer: Self-pay | Admitting: Pharmacist

## 2017-08-28 DIAGNOSIS — I4891 Unspecified atrial fibrillation: Secondary | ICD-10-CM | POA: Diagnosis not present

## 2017-08-28 DIAGNOSIS — E119 Type 2 diabetes mellitus without complications: Secondary | ICD-10-CM | POA: Diagnosis not present

## 2017-08-28 DIAGNOSIS — F419 Anxiety disorder, unspecified: Secondary | ICD-10-CM | POA: Diagnosis not present

## 2017-08-28 DIAGNOSIS — K219 Gastro-esophageal reflux disease without esophagitis: Secondary | ICD-10-CM | POA: Diagnosis not present

## 2017-08-28 DIAGNOSIS — Z9181 History of falling: Secondary | ICD-10-CM | POA: Diagnosis not present

## 2017-08-28 DIAGNOSIS — G4700 Insomnia, unspecified: Secondary | ICD-10-CM | POA: Diagnosis not present

## 2017-08-28 DIAGNOSIS — F329 Major depressive disorder, single episode, unspecified: Secondary | ICD-10-CM | POA: Diagnosis not present

## 2017-08-28 DIAGNOSIS — Z87440 Personal history of urinary (tract) infections: Secondary | ICD-10-CM | POA: Diagnosis not present

## 2017-08-28 DIAGNOSIS — E785 Hyperlipidemia, unspecified: Secondary | ICD-10-CM | POA: Diagnosis not present

## 2017-08-28 DIAGNOSIS — H409 Unspecified glaucoma: Secondary | ICD-10-CM | POA: Diagnosis not present

## 2017-08-28 DIAGNOSIS — Z79891 Long term (current) use of opiate analgesic: Secondary | ICD-10-CM | POA: Diagnosis not present

## 2017-08-28 DIAGNOSIS — Z950 Presence of cardiac pacemaker: Secondary | ICD-10-CM | POA: Diagnosis not present

## 2017-08-28 DIAGNOSIS — F0390 Unspecified dementia without behavioral disturbance: Secondary | ICD-10-CM | POA: Diagnosis not present

## 2017-08-28 DIAGNOSIS — D649 Anemia, unspecified: Secondary | ICD-10-CM | POA: Diagnosis not present

## 2017-08-28 DIAGNOSIS — I10 Essential (primary) hypertension: Secondary | ICD-10-CM | POA: Diagnosis not present

## 2017-08-28 NOTE — Patient Outreach (Signed)
[facility]) Care Management  08/28/2017  [name] April 11, 1923 [id number]   CSW was able to make contact with patient's son, [name] today to perform the initial phone assessment on patient, as well as assess and assist with social work needs and services.  CSW introduced self, explained role and types of services provided through [facility] ([facility]).  CSW further explained to Mr. Faller that [place] works with patient's RNCM, also with [place] [facility], Raina Mina. CSW then explained the reason for the call, indicating that Ms. Zigmund Daniel thought that patient would benefit from social work services and resources to assist with arranging for in-home care services versus placement arrangements in an assisted living facility.  CSW obtained two HIPAA compliant identifiers from Mr. Watlington, which included patient's name and date of birth. Mr. Caffee reported that patient is currently receiving 24 hour care and supervision through him, as he lives with patient and provides assistance with activities of daily living.  Patient currently receives home health nursing, physical therapy and occupational therapy through [facility].  Patient is also receiving home care services through [facility].  Mr. Fellman indicated that it is very difficult for him to manage patient's care; however, he verbalized being committed to doing so.  Mr. Hinger was not interested in having CSW make a referral for patient to receive in-home aide services, as Mr. Ems indicated that he is unable to afford the expense.  Mr. Megill was also not interested in talking with CSW about assisted living placement for patient.  CSW is aware that Mr. Lahti is currently not interested in any of the following resources, having already spoken with Ms. Zigmund Daniel about services provided for each: Teacher, early years/pre PACE (Program of No Name  for the Elderly) ACE ([place] for Enrichment) SCAT (Engineering geologist) [employer], through [employer] [facility] [place] Mr. Foxworthy declined social work, Paramedic services at this time.  CSW was able to ensure that Mr. Hammock has the correct contact information for CSW, encouraging him to contact CSW directly if social work needs arise in the near future.  Mr. Heffernan voiced understanding and was agreeable to this plan. CSW will perform a case closure on patient, as all goals of treatment have been met from social work standpoint and no additional social work needs have been identified at this time.  CSW will notify patient's RNCM with [facility], Raina Mina of CSW's plans to close patient's case.  CSW will fax an update to patient's Primary Care Physician, Dr. Bluford Kaufmann to ensure that they are aware of CSW's involvement with patient's plan of care.  CSW will submit a case closure request to Alycia Rossetti, Care Management Assistant with [facility], in the form of an [employer].   Nat Christen, BSW, MSW, LCSW  Licensed Education officer, environmental Health System  Mailing [place], [place], [place] [address] Physical Address-[address], [place], [place] [address] Toll Free Main # [phone] Fax # [phone] Cell # [phone]  Office # [phone] [email or web address]

## 2017-08-28 NOTE — Patient Outreach (Signed)
[facility] ([facility]N) Care Management  08/28/2017  Cindy ComasLucille M [name] 10-15-1922 [id number]   Called [facility] on the patient's behalf to request the refill sent in by Dr. Vernon PreyKwiatkowski's [facility] be filled for cash since it is too early on the patient's insurance. The pharmacist quoted the cash price as $5.38.  (The patient's son said they lost the furosemide.)  The patient's [name]([name]) was called. HIPAA identifiers were obtained.  [name] communicated understanding that [facility] with a copay of $5.38.  Plan: Close patient's case as the 30 day medication review was completed and the issue found during the review was communicated to the patient's physician and was handled.  Patient is still active with [facility] Community and [facility] Social Work.  Beecher McardleKatina J. Brettney Ficken, PharmD, BCACP [facility] Clinical Pharmacist [phone]

## 2017-08-28 NOTE — Patient Outreach (Signed)
[facility]N) Care Management  08/28/2017  Melody ComasLucille M Abrams 05-17-1923 [id number]    RN spoke with pt's consented son [name]([name] Pearse) and verified identifiers. Discussed [facility] services once agian and the purpose for today's call. Discussed the currently services involved [facility] for aide services, [facility] for [facility]/OT and nursing who is addressing prevention measures to keep pt safe, [facility] who has assisted with medication issues resolved today as pt states he has picked up the requested medication and currently pending [facility] social work for [employer] resources. RN discussed several services for transportation (no needs), mobile meals (decline at this time but may consider in the future), adult daycare (PACE) and discussed the different levels of care for possible Assisted Living if the care for the pt is overwhelming and additional assistance if needed however difficult to obtain. RN offered to assist with referrals for these agencies since he felt nursing in not needed on [facility] behalf due to other involved agencies. RN offered transition of care calls with the goals and plan of care focusing on safety/weakness and fall prevention. Discussed safety in the home and strongly encouraged son to encourage pt to use her rolling walker to prevent falls. Also discussed the risk involved if pt has a fall that may require surgery.  Caregiver indicated he would try to get the pt who has refused in the past to use her rolling walker (denies any recent falls). RN inquired on pt's HTN and diabetes as son indicates these medical conditions have "been fine" with no problems. RN offered weekly contacts for transition of care but son has opted to decline due to again other involved agency. RN has informed the caregiver that there is not conflict of interest for both agencies to be involved however confusing to son and he promptly took my name and contact number and will contact me if he has any needs  or further inquires. RN offered [employer] and [employer] if he was in needed of other community services in the future. Also informed pt that [facility] social worker would contact him soon and reiterate on available community resources as a reminder if he felt this would be helpful with the ongoing care he is providing for the pt. No other needs as consented son opt to again decline community home visits and transition of care calls from this RN case management with no needs at this time. Caregiver son is aware that RN will notify pt's primary provided of pt's disposition with [facility] services along with the other disciplines.  Elliot CousinLisa Matthews, RN Care Management Coordinator [facility] Main Office [phone]

## 2017-08-29 ENCOUNTER — Telehealth: Payer: Self-pay | Admitting: Family Medicine

## 2017-08-29 NOTE — Telephone Encounter (Signed)
Copied from CRM [phone]. Topic: General - Other >> Aug 29, 2017 11:52 AM Arlyss Gandyichardson, Taren N, NT wrote: Reason for CRM: Katina DegreeKatie Morgan [facility] needing verbal orders. 1xs a week for 1 week, 2 times a week for 2 weeks, and 1 time a week for 4 weeks. For teaching about alzheimer's disease and UTI for pt and son. CB#: [phone]

## 2017-08-29 NOTE — Telephone Encounter (Signed)
Please advise if okay to proceed with verbal orders. Thank you.

## 2017-09-01 ENCOUNTER — Encounter: Payer: PPO | Admitting: Physician Assistant

## 2017-09-01 NOTE — Telephone Encounter (Signed)
Okay to proceed with verbal orders

## 2017-09-01 NOTE — Telephone Encounter (Signed)
SC[employer] CorporationSpoke Katie and gave verbal orders per Dr. KKirtland Bouchard

## 2017-09-03 ENCOUNTER — Ambulatory Visit: Payer: Self-pay | Admitting: *Deleted

## 2017-09-04 ENCOUNTER — Telehealth: Payer: Self-pay | Admitting: Internal Medicine

## 2017-09-04 ENCOUNTER — Encounter: Payer: Self-pay | Admitting: Internal Medicine

## 2017-09-04 ENCOUNTER — Ambulatory Visit (INDEPENDENT_AMBULATORY_CARE_PROVIDER_SITE_OTHER): Payer: PPO | Admitting: Internal Medicine

## 2017-09-04 VITALS — BP 170/70 | HR 70 | Ht 65.0 in | Wt 153.0 lb

## 2017-09-04 DIAGNOSIS — F0151 Vascular dementia with behavioral disturbance: Secondary | ICD-10-CM | POA: Diagnosis not present

## 2017-09-04 DIAGNOSIS — I10 Essential (primary) hypertension: Secondary | ICD-10-CM

## 2017-09-04 DIAGNOSIS — F01518 Vascular dementia, unspecified severity, with other behavioral disturbance: Secondary | ICD-10-CM

## 2017-09-04 DIAGNOSIS — I480 Paroxysmal atrial fibrillation: Secondary | ICD-10-CM

## 2017-09-04 DIAGNOSIS — E119 Type 2 diabetes mellitus without complications: Secondary | ICD-10-CM

## 2017-09-04 MED ORDER — ADULT MULTIVITAMIN W/MINERALS CH: 1.0000 | ORAL_TABLET | Freq: Every day | ORAL | Status: DC

## 2017-09-04 NOTE — Telephone Encounter (Signed)
Please note

## 2017-09-04 NOTE — Telephone Encounter (Signed)
Copied from CRM [phone] [phone]. Topic: Quick Communication - See Telephone Encounter >> Sep 04, 2017  1:43 PM Herby AbrahamJohnson, Shiquita C wrote: CRM for notification. See Telephone encounter for: Annetta MawKina with [facility] - [phone] called in to make provider aware that pt cancelled apt because she is coming in to apt today. She will be going back out to pt tomorrow.   09/04/17.

## 2017-09-04 NOTE — Progress Notes (Signed)
Subjective:    Patient ID: [name], female    DOB: 1923-01-15, 84 y.o.   MRN: [id number]  HPI  84 year old patient who is seen today following a recent hospital discharge and for transitional care management  Discharge Diagnoses:  Active Problems:   Diabetes mellitus without complication [facility])   Essential hypertension   Atrial fibrillation (HCC)   Status post cardiac pacemaker procedure    Dementia  Since her hospital discharge 2 weeks ago she has done reasonably well.  She is cared for by her son who assist in all aspects of daily living.  Her status is back to baseline. Hospital records and medical regimen reviewed. Admit with worsening confusion and agitation.  Past Medical History:  Diagnosis Date  . ANEMIA, DEFICIENCY, HX OF 03/01/2010  . Anxiety   . DEPRESSIVE DISORDER 09/19/2010  . DM w/o Complication Type II 05/18/2007  . GERD 05/18/2007  . Glaucoma   . HYPERLIPIDEMIA 03/17/2008  . HYPERTENSION 05/18/2007  . INSOMNIA 11/23/2008  . Kidney stone   . Loss of weight 05/30/2010     Social History   Socioeconomic History  . Marital status: Widowed    Spouse name: Not on file  . Number of children: 3  . Years of education: Not on file  . Highest education level: Not on file  Social Needs  . Financial resource strain: Not on file  . Food insecurity - worry: Not on file  . Food insecurity - inability: Not on file  . Transportation needs - medical: Not on file  . Transportation needs - non-medical: Not on file  Occupational History  . Occupation: retired  Tobacco Use  . Smoking status: Never Smoker  . Smokeless tobacco: Never Used  Substance and Sexual Activity  . Alcohol use: No  . Drug use: No  . Sexual activity: Not on file  Other Topics Concern  . Not on file  Social History Narrative  . Not on file    Past Surgical History:  Procedure Laterality Date  . ABDOMINAL HYSTERECTOMY    . APPENDECTOMY    . CATARACT EXTRACTION    . COLONOSCOPY   06-28-10   per Dr. Arlyce Dice, diverticulosis only   . DILATION AND CURETTAGE OF UTERUS    . ESOPHAGOGASTRODUODENOSCOPY  05-13-11   per Dr. Arlyce Dice, normal   . HEMORRHOID SURGERY    . PACEMAKER INSERTION  09/2011  . PERMANENT PACEMAKER INSERTION N/A 09/30/2011   Procedure: PERMANENT PACEMAKER INSERTION;  Surgeon: Marinus Maw, MD;  Location: [facility] CATH LAB;  Service: Cardiovascular;  Laterality: N/A;    Family History  Problem Relation Age of Onset  . Unexplained death Mother   . Brain cancer Father        brain tumor  . Colon cancer Sister   . Colitis Brother   . Heart attack Brother     Allergies  Allergen Reactions  . Amoxicillin Rash  . Penicillins Rash    Has patient had a PCN reaction causing immediate rash, facial/tongue/throat swelling, SOB or lightheadedness with hypotension: unknown Has patient had a PCN reaction causing severe rash involving mucus membranes or skin necrosis: unkonwn Has patient had a PCN reaction that required hospitalization unknown Has patient had a PCN reaction occurring within the last 10 years: No If all of the above ansers are "NO", then may proceed with Cephalosporin use.     Current Outpatient Medications on File Prior to Visit  Medication Sig Dispense Refill  . acetaminophen (TYLENOL) 500  MG tablet Take 1,000 mg by mouth every 6 (six) hours as needed for moderate pain (pain). For pain    . famotidine (PEPCID) 20 MG tablet Take 1 tablet (20 mg total) by mouth 2 (two) times daily.    . furosemide (LASIX) 20 MG tablet TAKE 1 TABLET BY MOUTH ONCE DAILY 90 tablet 0  . lisinopril (PRINIVIL,ZESTRIL) 10 MG tablet Take 1 tablet (10 mg total) by mouth daily. 90 tablet 3  . LORazepam (ATIVAN) 0.5 MG tablet Take 1 tablet (0.5 mg total) by mouth 2 (two) times daily. 10 tablet 0  . sertraline (ZOLOFT) 50 MG tablet Take 1 tablet (50 mg total) by mouth daily. 30 tablet 2  . simvastatin (ZOCOR) 40 MG tablet Take 1 tablet (40 mg total) by mouth daily at 6 PM. 30  tablet 2  . traMADol (ULTRAM) 50 MG tablet Take 1 tablet (50 mg total) by mouth every 8 (eight) hours as needed. for pain 10 tablet 0   No current facility-administered medications on file prior to visit.     BP (!) 170/70 (BP Location: Left Arm, Patient Position: Sitting, Cuff Size: Normal)   Pulse 70   Ht 5\' 5"  (1.651 m)   Wt 153 lb (69.4 kg)   SpO2 97%   BMI 25.46 kg/m      Review of Systems  Constitutional: Negative.   HENT: Negative for congestion, dental problem, hearing loss, rhinorrhea, sinus pressure, sore throat and tinnitus.   Eyes: Negative for pain, discharge and visual disturbance.  Respiratory: Negative for cough and shortness of breath.   Cardiovascular: Negative for chest pain, palpitations and leg swelling.  Gastrointestinal: Negative for abdominal distention, abdominal pain, blood in stool, constipation, diarrhea, nausea and vomiting.  Genitourinary: Negative for difficulty urinating, dysuria, flank pain, frequency, hematuria, pelvic pain, urgency, vaginal bleeding, vaginal discharge and vaginal pain.  Musculoskeletal: Negative for arthralgias, gait problem and joint swelling.  Skin: Negative for rash.  Neurological: Negative for dizziness, syncope, speech difficulty, weakness, numbness and headaches.  Hematological: Negative for adenopathy.  Psychiatric/Behavioral: Positive for agitation, behavioral problems, confusion and decreased concentration. Negative for dysphoric mood. The patient is nervous/anxious.        Objective:   Physical Exam  Constitutional: She is oriented to person, place, and time. She appears well-developed and well-nourished.  Pleasant confused in no acute distress Repeat blood pressure 140/56  HENT:  Head: Normocephalic.  Right Ear: External ear normal.  Left Ear: External ear normal.  Mouth/Throat: Oropharynx is clear and moist.  Eyes: Conjunctivae and EOM are normal. Pupils are equal, round, and reactive to light.  Neck: Normal  range of motion. Neck supple. No thyromegaly present.  Cardiovascular: Normal rate, regular rhythm, normal heart sounds and intact distal pulses.  Pulmonary/Chest: Effort normal and breath sounds normal.  Abdominal: Soft. Bowel sounds are normal. She exhibits no mass. There is no tenderness.  Musculoskeletal: Normal range of motion.  Lymphadenopathy:    She has no cervical adenopathy.  Neurological: She is alert and oriented to person, place, and time.  Skin: Skin is warm and dry. No rash noted.  Psychiatric: She has a normal mood and affect.  confused          Assessment & Plan:   Dementia DM   Lab Results  Component Value Date   HGBA1C 6.2 06/17/2017   PAF- rhythm presently regular HTN stable  Anxiety disorder  Rogelia BogaKWIATKOWSKI,PETER FRANK

## 2017-09-04 NOTE — Patient Instructions (Signed)
Okay to discontinue iron and substitute a multivitamin once daily  Return in 6 months for follow-up or as needed

## 2017-09-05 ENCOUNTER — Emergency Department (HOSPITAL_COMMUNITY): Payer: PPO

## 2017-09-05 ENCOUNTER — Ambulatory Visit: Payer: Self-pay | Admitting: *Deleted

## 2017-09-05 ENCOUNTER — Telehealth: Payer: Self-pay | Admitting: Family Medicine

## 2017-09-05 ENCOUNTER — Emergency Department (HOSPITAL_COMMUNITY)
Admission: EM | Admit: 2017-09-05 | Discharge: 2017-09-05 | Disposition: A | Discharge status: Home / Self Care | Payer: PPO | Attending: Emergency Medicine | Admitting: Emergency Medicine

## 2017-09-05 DIAGNOSIS — E119 Type 2 diabetes mellitus without complications: Secondary | ICD-10-CM | POA: Diagnosis not present

## 2017-09-05 DIAGNOSIS — F0390 Unspecified dementia without behavioral disturbance: Secondary | ICD-10-CM | POA: Insufficient documentation

## 2017-09-05 DIAGNOSIS — R079 Chest pain, unspecified: Secondary | ICD-10-CM | POA: Diagnosis not present

## 2017-09-05 DIAGNOSIS — Z79899 Other long term (current) drug therapy: Secondary | ICD-10-CM | POA: Insufficient documentation

## 2017-09-05 DIAGNOSIS — I10 Essential (primary) hypertension: Secondary | ICD-10-CM | POA: Insufficient documentation

## 2017-09-05 DIAGNOSIS — R072 Precordial pain: Secondary | ICD-10-CM | POA: Diagnosis not present

## 2017-09-05 DIAGNOSIS — Z950 Presence of cardiac pacemaker: Secondary | ICD-10-CM | POA: Diagnosis not present

## 2017-09-05 LAB — CBC
HEMATOCRIT: 42.2 % (ref 36.0–46.0)
HEMOGLOBIN: 13.6 g/dL (ref 12.0–15.0)
MCH: 28.6 pg (ref 26.0–34.0)
MCHC: 32.2 g/dL (ref 30.0–36.0)
MCV: 88.8 fL (ref 78.0–100.0)
Platelets: 262 10*3/uL (ref 150–400)
RBC: 4.75 MIL/uL (ref 3.87–5.11)
RDW: 14.7 % (ref 11.5–15.5)
WBC: 12.4 10*3/uL — ABNORMAL HIGH (ref 4.0–10.5)

## 2017-09-05 LAB — I-STAT TROPONIN, ED
TROPONIN I, POC: 0.01 ng/mL (ref 0.00–0.08)
Troponin i, poc: 0.02 ng/mL (ref 0.00–0.08)

## 2017-09-05 LAB — URINALYSIS, ROUTINE W REFLEX MICROSCOPIC
Bilirubin Urine: NEGATIVE
GLUCOSE, UA: NEGATIVE mg/dL
HGB URINE DIPSTICK: NEGATIVE
Ketones, ur: 5 mg/dL — AB
LEUKOCYTES UA: NEGATIVE
Nitrite: NEGATIVE
PROTEIN: NEGATIVE mg/dL
SPECIFIC GRAVITY, URINE: 1.005 (ref 1.005–1.030)
pH: 7 (ref 5.0–8.0)

## 2017-09-05 LAB — MAGNESIUM: Magnesium: 2.3 mg/dL (ref 1.7–2.4)

## 2017-09-05 LAB — BASIC METABOLIC PANEL
ANION GAP: 11 (ref 5–15)
BUN: 22 mg/dL — AB (ref 6–20)
CO2: 23 mmol/L (ref 22–32)
Calcium: 9.6 mg/dL (ref 8.9–10.3)
Chloride: 104 mmol/L (ref 101–111)
Creatinine, Ser: 0.95 mg/dL (ref 0.44–1.00)
GFR calc Af Amer: 58 mL/min — ABNORMAL LOW (ref >60)
GFR calc non Af Amer: 50 mL/min — ABNORMAL LOW (ref >60)
GLUCOSE: 97 mg/dL (ref 65–99)
POTASSIUM: 3.6 mmol/L (ref 3.5–5.1)
Sodium: 138 mmol/L (ref 135–145)

## 2017-09-05 LAB — APTT: APTT: 28 s (ref 24–36)

## 2017-09-05 LAB — PROTIME-INR
INR: 1.03
Prothrombin Time: 13.4 seconds (ref 11.4–15.2)

## 2017-09-05 MED ORDER — ASPIRIN 81 MG PO CHEW
324.0000 mg | CHEWABLE_TABLET | Freq: Once | ORAL | Status: AC
  Administered 2017-09-05: 324 mg via ORAL
  Filled 2017-09-05: qty 4

## 2017-09-05 NOTE — ED Provider Notes (Signed)
  Face-to-face evaluation   History: She presents for evaluation of a sudden onset of chest pain, as she awoke from sleep, at home today.  The pain was transient and resolved by the time EMS arrived.  There was no associated symptoms that occurred with it.  No recent trauma.  Physical exam: Alert elderly female who is calm and comfortable.  She is asking to go home.  No dysarthria, or aphasia.  Heart regular rate and rhythm without murmur lungs clear anteriorly.  Chest is nontender to palpation.  Medical screening examination/treatment/procedure(s) were conducted as a shared visit with non-physician practitioner(s) and myself.  I personally evaluated the patient during the encounter   Mancel BaleWentz, Braylynn Ghan, MD 09/06/17 [phone]

## 2017-09-05 NOTE — Telephone Encounter (Signed)
Pt's son, [name],  called because the pt is having chest pain between her breast, described as "crushing"; per nurse triage protocol EMS is called per the; spoke with operator [phone] and EMS dispatched; will route to [facility] for notification of this encounter. Reason for Disposition . [1] Chest pain lasts > 5 minutes AND [2] described as crushing, pressure-like, or heavy  Answer Assessment - Initial Assessment Questions 1. LOCATION: "Where does it hurt?"      Center of her chest, "in the middle of her boob" 2. RADIATION: "Does the pain go anywhere else?" (e.g., into neck, jaw, arms, back)     no 3. ONSET: "When did the chest pain begin?" (Minutes, hours or days)      30 minutes ago  4. PATTERN "Does the pain come and go, or has it been constant since it started?"  "Does it get worse with exertion?"      constant 5. DURATION: "How long does it last" (e.g., seconds, minutes, hours)     minutes 6. SEVERITY: "How bad is the pain?"  (e.g., Scale 1-10; mild, moderate, or severe)    - MILD (1-3): doesn't interfere with normal activities     - MODERATE (4-7): interferes with normal activities or awakens from sleep    - SEVERE (8-10): excruciating pain, unable to do any normal activities       Severe, pt is crying  7. CARDIAC RISK FACTORS: "Do you have any history of heart problems or risk factors for heart disease?" (e.g., prior heart attack, angina; high blood pressure, diabetes, being overweight, high cholesterol, smoking, or strong family history of heart disease)     Diabetes, hypertension, pace maker 8. PULMONARY RISK FACTORS: "Do you have any history of lung disease?"  (e.g., blood clots in lung, asthma, emphysema, birth control pills)     no 9. CAUSE: "What do you think is causing the chest pain?"     unsure 10. OTHER SYMPTOMS: "Do you have any other symptoms?" (e.g., dizziness, nausea, vomiting, sweating, fever, difficulty breathing, cough)       occassional dizziness when  walking 11. PREGNANCY: "Is there any chance you are pregnant?" "When was your last menstrual period?"       no  Protocols used: CHEST PAIN-A-AH

## 2017-09-05 NOTE — ED Notes (Signed)
Family at bedside. 

## 2017-09-05 NOTE — Discharge Instructions (Signed)
Workup in the emergency department was reassuring. Follow up with a primary care doctor in the next 2-3 days for reevaluation. Return to ED if chest pain becomes exertional, shortness of breath, nausea, vomiting, fevers.

## 2017-09-05 NOTE — ED Triage Notes (Signed)
Pt BIB EMS from home for CP. Per EMS, pt having substernal CP on palpation; pt had fall a couple weeks ago and fell on chest. Pt endorses CP but hx is limited d/t baseline dementia. Resp e/u; nad at this time.

## 2017-09-05 NOTE — ED Notes (Signed)
Pt agitated asking for family; refusing to go to xray. EDP made aware.

## 2017-09-05 NOTE — Telephone Encounter (Signed)
Monitor patient for ER arrival 

## 2017-09-05 NOTE — ED Notes (Signed)
Interrogated pt [employer] education [employer] Pacemaker; [employer] not transmitting at this time. Rep contacted about the issue and state they will call this RN back and help resolve the problem. EDP to be made aware.

## 2017-09-05 NOTE — Telephone Encounter (Signed)
Confirmed patient is in the ER now.  

## 2017-09-05 NOTE — ED Provider Notes (Signed)
[facility] [place] EMERGENCY DEPARTMENT Provider Note   CSN: [id number] Arrival date & time: 09/05/17  1543     History   Chief Complaint Chief Complaint  Patient presents with  . Chest Pain  . Hypertension    Level 5 Caveat  HPI [name] is a 84 y.o. female with history of advanced dementia, atrial fibrillation status post pacemaker, hypertension, diabetes, hyperlipidemia presents to ED for evaluation of chest pain. Level V caveat due to advanced dementia. History is obtained from chart review, triage note and the nurse who received report. No family at bedside to assist with history. We'll attempt to contact patient's son to obtain corroborating history. Patient is alert to self only. She denies any pain. She does not know where she is or why she is here. Chart reveals pt's son called PCP office today for advice. He reported pt was crying due to central chest pain, he was advised to send pt to ED.   HPI  Past Medical History:  Diagnosis Date  . ANEMIA, DEFICIENCY, HX OF 03/01/2010  . Anxiety   . DEPRESSIVE DISORDER 09/19/2010  . DM w/o Complication Type II 05/18/2007  . GERD 05/18/2007  . Glaucoma   . HYPERLIPIDEMIA 03/17/2008  . HYPERTENSION 05/18/2007  . INSOMNIA 11/23/2008  . Kidney stone   . Loss of weight 05/30/2010    Patient Active Problem List   Diagnosis Date Noted  . UTI (urinary tract infection) 08/19/2017  . Dementia 08/19/2017  . Anxiety 10/27/2012  . Symptomatic bradycardia 10/01/2011  . Status post cardiac pacemaker procedure 10/01/2011  . Atrial fibrillation (HCC) 09/29/2011  . Pain disorder 04/19/2011  . Chest pain, atypical 02/20/2011  . DEPRESSIVE DISORDER 09/19/2010  . LOSS OF WEIGHT 05/30/2010  . ANEMIA, DEFICIENCY, HX OF 03/01/2010  . INSOMNIA 11/23/2008  . Dyslipidemia 03/17/2008  . Diabetes mellitus without complication (HCC) 05/18/2007  . Essential hypertension 05/18/2007  . GERD 05/18/2007    Past Surgical History:    Procedure Laterality Date  . ABDOMINAL HYSTERECTOMY    . APPENDECTOMY    . CATARACT EXTRACTION    . COLONOSCOPY  06-28-10   per Dr. Arlyce Dice, diverticulosis only   . DILATION AND CURETTAGE OF UTERUS    . ESOPHAGOGASTRODUODENOSCOPY  05-13-11   per Dr. Arlyce Dice, normal   . HEMORRHOID SURGERY    . PACEMAKER INSERTION  09/2011  . PERMANENT PACEMAKER INSERTION N/A 09/30/2011   Procedure: PERMANENT PACEMAKER INSERTION;  Surgeon: Marinus Maw, MD;  Location: [facility] CATH LAB;  Service: Cardiovascular;  Laterality: N/A;    OB History    Gravida Para Term Preterm AB Living             3   SAB TAB Ectopic Multiple Live Births                   Home Medications    Prior to Admission medications   Medication Sig Start Date End Date Taking? Authorizing Provider  acetaminophen (TYLENOL) 500 MG tablet Take 1,000 mg by mouth every 6 (six) hours as needed for moderate pain (pain). For pain    [provider]  famotidine (PEPCID) 20 MG tablet Take 1 tablet (20 mg total) by mouth 2 (two) times daily. 08/21/17   Meredeth Ide, MD  furosemide (LASIX) 20 MG tablet TAKE 1 TABLET BY MOUTH ONCE DAILY 08/28/17   Gordy Savers, MD  lisinopril (PRINIVIL,ZESTRIL) 10 MG tablet Take 1 tablet (10 mg total) by mouth daily. 06/17/17  Gordy SaversKwiatkowski, Peter F, MD  LORazepam (ATIVAN) 0.5 MG tablet Take 1 tablet (0.5 mg total) by mouth 2 (two) times daily. 08/21/17   Meredeth IdeLama, Gagan S, MD  Multiple Vitamin (MULTIVITAMIN WITH MINERALS) TABS tablet Take 1 tablet by mouth daily. 09/04/17   Gordy SaversKwiatkowski, Peter F, MD  sertraline (ZOLOFT) 50 MG tablet Take 1 tablet (50 mg total) by mouth daily. 08/22/17   Meredeth IdeLama, Gagan S, MD  simvastatin (ZOCOR) 40 MG tablet Take 1 tablet (40 mg total) by mouth daily at 6 PM. 08/21/17   Meredeth IdeLama, Gagan S, MD  traMADol (ULTRAM) 50 MG tablet Take 1 tablet (50 mg total) by mouth every 8 (eight) hours as needed. for pain 08/21/17   Meredeth IdeLama, Gagan S, MD    Family History Family History  Problem  Relation Age of Onset  . Unexplained death Mother   . Brain cancer Father        brain tumor  . Colon cancer Sister   . Colitis Brother   . Heart attack Brother     Social History Social History   Tobacco Use  . Smoking status: Never Smoker  . Smokeless tobacco: Never Used  Substance Use Topics  . Alcohol use: No  . Drug use: No     Allergies   Amoxicillin and Penicillins   Review of Systems Review of Systems  Unable to perform ROS: Dementia  Cardiovascular: Positive for chest pain.     Physical Exam Updated Vital Signs BP (!) 177/78   Pulse (!) 59   Temp 97.8 F (36.6 C) (Oral)   Resp 15   SpO2 96%   Physical Exam  Constitutional: She appears well-developed and well-nourished. No distress.  Non toxic  HENT:  Head: Normocephalic and atraumatic.  Nose: Nose normal.  Mouth/Throat: No oropharyngeal exudate.  Moist mucous membranes   Eyes: Conjunctivae and EOM are normal. Pupils are equal, round, and reactive to light.  Neck: Normal range of motion.  Cardiovascular: Normal rate, regular rhythm and intact distal pulses.  No murmur heard. 2+ DP and radial pulses bilaterally. No LE edema. No reproducible chest tenderness.   Pulmonary/Chest: Effort normal and breath sounds normal. No respiratory distress.  Abdominal: Soft. Bowel sounds are normal. There is no tenderness.  No G/R/R. No suprapubic or CVA tenderness.   Musculoskeletal: Normal range of motion. She exhibits no deformity.  Neurological: She is alert.  Alert to voice. Oriented to self only. Speech is slowed. No obvious aphasia or dysarthria. Smile is symmetric. Strength 5/5 with hand grip. Unable assess further neuro deficits as pt could not follow commands.   Skin: Skin is warm and dry. Capillary refill takes less than 2 seconds.  No ecchymosis, erythema, warmth to back or buttocks.   Psychiatric: She has a normal mood and affect. Her behavior is normal. Judgment and thought content normal.  Nursing  note and vitals reviewed.    ED Treatments / Results  Labs (all labs ordered are listed, but only abnormal results are displayed) Labs Reviewed  BASIC METABOLIC PANEL - Abnormal; Notable for the following components:      Result Value   BUN 22 (*)    GFR calc non Af Amer 50 (*)    GFR calc Af Amer 58 (*)    All other components within normal limits  CBC - Abnormal; Notable for the following components:   WBC 12.4 (*)    All other components within normal limits  URINALYSIS, ROUTINE W REFLEX MICROSCOPIC - Abnormal; Notable for  the following components:   Color, Urine STRAW (*)    Ketones, ur 5 (*)    All other components within normal limits  MAGNESIUM  PROTIME-INR  APTT  I-STAT TROPONIN, ED  I-STAT TROPONIN, ED  CBG MONITORING, ED    EKG  EKG Interpretation  Date/Time:  Friday September 05 2017 16:00:57 EST Ventricular Rate:  60 PR Interval:    QRS Duration: 160 QT Interval:  501 QTC Calculation: 501 R Axis:   -43 Text Interpretation:  Sinus rhythm RBBB and LAFB Since last tracing native sinus pacing has replaced atrial pacing Confirmed by Mancel Bale [phone]) on 09/05/2017 4:38:36 PM       Radiology Dg Chest Portable 1 View  Result Date: 09/05/2017 CLINICAL DATA:  Substernal chest pain.  Fall a few weeks ago. EXAM: PORTABLE CHEST 1 VIEW COMPARISON:  08/19/2017 FINDINGS: Dual-chamber pacer leads from the left in stable position. Chronic cardiomegaly. Interstitial coarsening that is chronic appearing. There is no edema, consolidation, effusion, or pneumothorax. No acute osseous finding. IMPRESSION: No evidence of active disease.  Stable compared to prior. Electronically Signed   By: Marnee Spring M.D.   On: 09/05/2017 17:48    Procedures Procedures (including critical care time)  Medications Ordered in ED Medications  aspirin chewable tablet 324 mg (324 mg Oral Given 09/05/17 1736)     Initial Impression / Assessment and Plan / ED Course  I have reviewed the  triage vital signs and the nursing notes.  Pertinent labs & imaging results that were available during my care of the patient were reviewed by me and considered in my medical decision making (see chart for details).  Clinical Course as of Sep 06 2043  Fri Sep 05, 2017  1853 WBC: (!) 12.4 [CG]    Clinical Course User Index [CG] Liberty Handy, [place]   84 year old female brought to ED for sudden onset central chest pain.History limited due to advanced dementia. Family at bedside provided further history than what was initially available. Pt woke up from a nap when suddenly began crying reporting central chest pain.  This resolved without intervention and by the time EMS arrived. On my exam patient is pleasantly confused. She denies any pain. She requests going home  Given history of hyperlipidemia, hypertension, age she has an elevated heart score however chest pain does not sound cardiac in nature today.   Lab work including CBC, BMP, magnesium, delta troponin unremarkable. U/A without infection. CXR w/o infiltrate. EKG w/o changes.   Final Clinical Impressions(s) / ED Diagnoses   Patient shared with supervising physician who also independently evaluated patient. At this time, patient is considered safe for discharge. Discussed return precautions with patient family member who verbalized understanding.  Final diagnoses:  Precordial pain    ED Discharge Orders    None       Jerrell Mylar 09/05/17 2045    Mancel Bale, MD 09/06/17 [phone]

## 2017-09-05 NOTE — Telephone Encounter (Signed)
Copied from CRM [id number]. Topic: Inquiry >> Sep 05, 2017  3:58 PM Everardo PacificMoton, Kelly, [place] wrote: Reason for CRM: Inetta Fermoina called and would like to know if Ms.Dunford could be set up with a Child psychotherapistsocial worker consult. She stated that the son is having a hard time managing with his mother and taking care of daily errands that need to be done. If anyone had more questions she can be reached at [phone]

## 2017-09-05 NOTE — ED Notes (Signed)
Portable xray at bedside.

## 2017-09-08 ENCOUNTER — Other Ambulatory Visit: Payer: Self-pay | Admitting: Internal Medicine

## 2017-09-08 NOTE — Telephone Encounter (Signed)
Yes please arrange for social service consultation

## 2017-09-10 NOTE — Telephone Encounter (Signed)
Lorazepam last filled on 08/15/17 #90 and 08/21/17 #10 Tramadol last filled on 07/21/17 #90 and 08/21/17 # 10 Last seen on 09/04/17.  Please advise.

## 2017-09-11 ENCOUNTER — Inpatient Hospital Stay: Payer: PPO | Admitting: Internal Medicine

## 2017-09-11 ENCOUNTER — Telehealth: Payer: Self-pay | Admitting: Internal Medicine

## 2017-09-11 NOTE — Telephone Encounter (Signed)
Copied from CRM [phone]. Topic: Referral - Request >> Sep 11, 2017  8:37 AM Everardo PacificMoton, Joreen Swearingin, [place] wrote: Reason for CRM: Inetta Fermoina called from [facility] because she would like to know if the patient could have a referral for a social work. Stated that the patients son is having a had time with his mothers care. If someone could give her a call back about this at [phone]

## 2017-09-11 NOTE — Telephone Encounter (Signed)
Copied from CRM [id number]. Topic: Quick Communication - Rx Refill/Question >> Sep 11, 2017 12:15 PM Louie BunPalacios Medina, Rosey Batheresa D wrote: Medication: LORazepam (ATIVAN) 0.5 MG tablet and traMADol (ULTRAM) 50 MG tablet.  Has the patient contacted their pharmacy? Yes   (Agent: If no, request that the patient contact the pharmacy for the refill.)   Preferred Pharmacy (with phone number or street name): [facility] - [place], [place] - [id number]   Agent: Please be advised that RX refills may take up to 3 business days. We ask that you follow-up with your pharmacy.

## 2017-09-11 NOTE — Telephone Encounter (Signed)
Okay for refill #90 each

## 2017-09-11 NOTE — Telephone Encounter (Signed)
[facility].

## 2017-09-12 NOTE — Telephone Encounter (Signed)
Called [facility] and spoke with Pam verbal orders were given for Social work referral per Dr Kirtland BouchardK.

## 2017-09-12 NOTE — Telephone Encounter (Signed)
Please see other telephone encounter with the same request. Verbal orders already given.

## 2017-09-12 NOTE — Telephone Encounter (Signed)
Spoke with pt's son Gerlene BurdockRichard, he verbalized that someone had called in the medications to the correct pharmacy. Nothing further needed at this time

## 2017-09-12 NOTE — Telephone Encounter (Signed)
[facility] called this morning, [facility] pharmacy [facility] Must cancel and resend, also requested to remove [facility] from chart.

## 2017-09-15 ENCOUNTER — Telehealth: Payer: Self-pay | Admitting: Family Medicine

## 2017-09-15 NOTE — Telephone Encounter (Signed)
Copied from CRM [phone]. Topic: Complaint - Care >> Sep 15, 2017 10:02 AM Waymon AmatoBurton, Donna F wrote: Date of Incident: [id number] son not sure of the date  Details of complaint: medications keep going to the wrong pharmacy  How would the patient like to see it resolved? Would like to speak with the manager  On a scale of 1-10, how was your experience?  What would it take to bring it to a 10?   Route to Research officer, political partyractice Administrator.

## 2017-09-16 NOTE — Telephone Encounter (Signed)
Spoke w/ patient's son, [name] (on [place]), and assured him that [facility] has been removed from patient's chart and verified that the correct pharmacy is now listed in patient's chart. He voiced frustration that the medication was called to the wrong pharmacy in the first place because it inconvenienced him and placed an increased burden on him when he is already stressed by trying to care for his mother. I apologized for the error and assured him that moving forward this would not be an issue, as only one pharmacy is listed in the patient's chart now. He verbalized understanding and was appreciative of the call. No further needs identified at this time.

## 2017-09-25 ENCOUNTER — Emergency Department (HOSPITAL_COMMUNITY)
Admission: EM | Admit: 2017-09-25 | Discharge: 2017-09-25 | Disposition: A | Discharge status: Home / Self Care | Payer: PPO | Attending: Emergency Medicine | Admitting: Emergency Medicine

## 2017-09-25 ENCOUNTER — Encounter (HOSPITAL_COMMUNITY): Payer: Self-pay | Admitting: Emergency Medicine

## 2017-09-25 ENCOUNTER — Ambulatory Visit: Payer: Self-pay

## 2017-09-25 DIAGNOSIS — I10 Essential (primary) hypertension: Secondary | ICD-10-CM | POA: Diagnosis not present

## 2017-09-25 DIAGNOSIS — R1013 Epigastric pain: Secondary | ICD-10-CM | POA: Diagnosis not present

## 2017-09-25 DIAGNOSIS — E119 Type 2 diabetes mellitus without complications: Secondary | ICD-10-CM | POA: Insufficient documentation

## 2017-09-25 DIAGNOSIS — R195 Other fecal abnormalities: Secondary | ICD-10-CM | POA: Diagnosis not present

## 2017-09-25 DIAGNOSIS — F0390 Unspecified dementia without behavioral disturbance: Secondary | ICD-10-CM | POA: Diagnosis not present

## 2017-09-25 DIAGNOSIS — F419 Anxiety disorder, unspecified: Secondary | ICD-10-CM | POA: Diagnosis not present

## 2017-09-25 DIAGNOSIS — Z79899 Other long term (current) drug therapy: Secondary | ICD-10-CM | POA: Diagnosis not present

## 2017-09-25 LAB — COMPREHENSIVE METABOLIC PANEL
ALT: 11 U/L — ABNORMAL LOW (ref 14–54)
AST: 16 U/L (ref 15–41)
Albumin: 3.9 g/dL (ref 3.5–5.0)
Alkaline Phosphatase: 42 U/L (ref 38–126)
Anion gap: 10 (ref 5–15)
BUN: 18 mg/dL (ref 6–20)
CO2: 28 mmol/L (ref 22–32)
Calcium: 9.1 mg/dL (ref 8.9–10.3)
Chloride: 101 mmol/L (ref 101–111)
Creatinine, Ser: 0.96 mg/dL (ref 0.44–1.00)
GFR calc Af Amer: 58 mL/min — ABNORMAL LOW (ref >60)
GFR calc non Af Amer: 50 mL/min — ABNORMAL LOW (ref >60)
Glucose, Bld: 91 mg/dL (ref 65–99)
Potassium: 3.6 mmol/L (ref 3.5–5.1)
Sodium: 139 mmol/L (ref 135–145)
Total Bilirubin: 0.6 mg/dL (ref 0.3–1.2)
Total Protein: 7.4 g/dL (ref 6.5–8.1)

## 2017-09-25 LAB — TYPE AND SCREEN
ABO/RH(D): O NEG
Antibody Screen: NEGATIVE

## 2017-09-25 LAB — CBC
HCT: 42.1 % (ref 36.0–46.0)
Hemoglobin: 13.4 g/dL (ref 12.0–15.0)
MCH: 29.1 pg (ref 26.0–34.0)
MCHC: 31.8 g/dL (ref 30.0–36.0)
MCV: 91.3 fL (ref 78.0–100.0)
Platelets: 228 10*3/uL (ref 150–400)
RBC: 4.61 MIL/uL (ref 3.87–5.11)
RDW: 14.3 % (ref 11.5–15.5)
WBC: 11.3 10*3/uL — ABNORMAL HIGH (ref 4.0–10.5)

## 2017-09-25 NOTE — Discharge Instructions (Signed)
Your mother's hemoglobin level today is normal.  One of the other tests we performed called BUN is also normal.  This is not a direct test of bleeding, but is often elevated with upper GI bleeds.  Her vital signs are reassuring.  At this time, I have a low suspicion for significant GI bleed.  If she were my mother or grandmother, I would not put her through trying to do a rectal exam with how apprehensive she is.  I recommend you keep an eye on her stools at this point.  She is complaining of increasing pain has continued black or bloody stool, then I would like her rechecked.

## 2017-09-25 NOTE — Telephone Encounter (Signed)
Pt has arrived at [facility] ED.  

## 2017-09-25 NOTE — ED Notes (Signed)
Pt is assessed and discharged [facility].  Pt's son is at bedside.  D/c instruction given to pt's son.

## 2017-09-25 NOTE — ED Provider Notes (Signed)
[facility]-EMERGENCY DEPT Provider Note   CSN: [id number] Arrival date & time: 09/25/17  1603     History   Chief Complaint Chief Complaint  Patient presents with  . Abdominal Pain  . Rectal Bleeding    HPI [name] is a 84 y.o. female.  HPI   84 year old female brought in for evaluation of dark colored stools.  History is from her son who is accompanying her.  She has dementia/anxiety and is very hard to obtain much of a history from her.  At the last couple days she is noted to have dark tarry appearing stools.  Intermittently complaining of pain and pointing towards her epigastrium and underneath her left breast.  Otherwise deemed to be in her usual state of health.  She is not anticoagulated.  No recent medication changes.  She does have a past history of GERD and is on Pepcid.  Past Medical History:  Diagnosis Date  . ANEMIA, DEFICIENCY, HX OF 03/01/2010  . Anxiety   . DEPRESSIVE DISORDER 09/19/2010  . DM w/o Complication Type II 05/18/2007  . GERD 05/18/2007  . Glaucoma   . HYPERLIPIDEMIA 03/17/2008  . HYPERTENSION 05/18/2007  . INSOMNIA 11/23/2008  . Kidney stone   . Loss of weight 05/30/2010    Patient Active Problem List   Diagnosis Date Noted  . UTI (urinary tract infection) 08/19/2017  . Dementia 08/19/2017  . Anxiety 10/27/2012  . Symptomatic bradycardia 10/01/2011  . Status post cardiac pacemaker procedure 10/01/2011  . Atrial fibrillation (HCC) 09/29/2011  . Pain disorder 04/19/2011  . Chest pain, atypical 02/20/2011  . DEPRESSIVE DISORDER 09/19/2010  . LOSS OF WEIGHT 05/30/2010  . ANEMIA, DEFICIENCY, HX OF 03/01/2010  . INSOMNIA 11/23/2008  . Dyslipidemia 03/17/2008  . Diabetes mellitus without complication (HCC) 05/18/2007  . Essential hypertension 05/18/2007  . GERD 05/18/2007    Past Surgical History:  Procedure Laterality Date  . ABDOMINAL HYSTERECTOMY    . APPENDECTOMY    . CATARACT EXTRACTION    . COLONOSCOPY   06-28-10   per Dr. Arlyce Dice, diverticulosis only   . DILATION AND CURETTAGE OF UTERUS    . ESOPHAGOGASTRODUODENOSCOPY  05-13-11   per Dr. Arlyce Dice, normal   . HEMORRHOID SURGERY    . PACEMAKER INSERTION  09/2011  . PERMANENT PACEMAKER INSERTION N/A 09/30/2011   Procedure: PERMANENT PACEMAKER INSERTION;  Surgeon: Marinus Maw, MD;  Location: [facility] CATH LAB;  Service: Cardiovascular;  Laterality: N/A;    OB History    Gravida Para Term Preterm AB Living             3   SAB TAB Ectopic Multiple Live Births                   Home Medications    Prior to Admission medications   Medication Sig Start Date End Date Taking? Authorizing Provider  acetaminophen (TYLENOL) 500 MG tablet Take 1,000 mg by mouth every 6 (six) hours as needed for moderate pain (pain). For pain    [provider]  famotidine (PEPCID) 20 MG tablet Take 1 tablet (20 mg total) by mouth 2 (two) times daily. 08/21/17   Meredeth Ide, MD  furosemide (LASIX) 20 MG tablet TAKE 1 TABLET BY MOUTH ONCE DAILY 08/28/17   Gordy Savers, MD  lisinopril (PRINIVIL,ZESTRIL) 10 MG tablet Take 1 tablet (10 mg total) by mouth daily. 06/17/17   Gordy Savers, MD  LORazepam (ATIVAN) 0.5 MG tablet TAKE 1  TABLET BY MOUTH THREE TIMES DAILY 09/11/17   Nafziger, Kandee Keenory, NP  Multiple Vitamin (MULTIVITAMIN WITH MINERALS) TABS tablet Take 1 tablet by mouth daily. 09/04/17   Gordy SaversKwiatkowski, Peter F, MD  sertraline (ZOLOFT) 50 MG tablet Take 1 tablet (50 mg total) by mouth daily. 08/22/17   Meredeth IdeLama, Gagan S, MD  simvastatin (ZOCOR) 40 MG tablet Take 1 tablet (40 mg total) by mouth daily at 6 PM. 08/21/17   Meredeth IdeLama, Gagan S, MD  traMADol (ULTRAM) 50 MG tablet TAKE 1 TABLET BY MOUTH EVERY 8 HOURS AS NEEDED FOR PAIN 09/11/17   Shirline FreesNafziger, Cory, NP    Family History Family History  Problem Relation Age of Onset  . Unexplained death Mother   . Brain cancer Father        brain tumor  . Colon cancer Sister   . Colitis Brother   . Heart attack  Brother     Social History Social History   Tobacco Use  . Smoking status: Never Smoker  . Smokeless tobacco: Never Used  Substance Use Topics  . Alcohol use: No  . Drug use: No     Allergies   Amoxicillin and Penicillins   Review of Systems Review of Systems  All systems reviewed and negative, other than as noted in HPI.  Physical Exam Updated Vital Signs BP (!) 143/74 (BP Location: Left Arm)   Pulse 64   Temp 98.4 F (36.9 C) (Oral)   Resp 18   SpO2 100%   Physical Exam  Constitutional: She appears well-developed and well-nourished. No distress.  Sitting in chair bedside her son. Very anxious. Continually questioning the need to be here and stating she wants to leave.   HENT:  Head: Normocephalic and atraumatic.  Eyes: Conjunctivae are normal. Right eye exhibits no discharge. Left eye exhibits no discharge.  Neck: Neck supple.  Cardiovascular: Normal rate, regular rhythm and normal heart sounds. Exam reveals no gallop and no friction rub.  No murmur heard. Pulmonary/Chest: Effort normal and breath sounds normal. No respiratory distress.  Abdominal: Soft. She exhibits no distension. There is no tenderness.  Soft. Non-tender. No distension.   Musculoskeletal: She exhibits no edema or tenderness.  Neurological: She is alert.  Skin: Skin is warm and dry.  Psychiatric: She has a normal mood and affect. Her behavior is normal. Thought content normal.  Nursing note and vitals reviewed.    ED Treatments / Results  Labs (all labs ordered are listed, but only abnormal results are displayed) Labs Reviewed  COMPREHENSIVE METABOLIC PANEL - Abnormal; Notable for the following components:      Result Value   ALT 11 (*)    GFR calc non Af Amer 50 (*)    GFR calc Af Amer 58 (*)    All other components within normal limits  CBC - Abnormal; Notable for the following components:   WBC 11.3 (*)    All other components within normal limits  POC OCCULT BLOOD, ED  TYPE  AND SCREEN  ABO/RH    EKG  EKG Interpretation None       Radiology No results found.  Procedures Procedures (including critical care time)  Medications Ordered in ED Medications - No data to display   Initial Impression / Assessment and Plan / ED Course  I have reviewed the triage vital signs and the nursing notes.  Pertinent labs & imaging results that were available during my care of the patient were reviewed by me and considered in my medical decision  making (see chart for details).     84 year old female with recent dark stools and has been complaining some abdominal pain.  Her abdomen exam for me is benign.  She is afebrile.  Heart rate in the 60s and no hypotension.  Her H/H is normal.  BUN is normal.  She is not anticoagulated.    She has advanced dementia and is extremely anxious to the point that she needed several minutes of reassurance just to obtain a physical exam. My suspicion for a serious GIB is low at this point. I don't it's worth the distress it would likely cause her to coax her into a rectal exam. Discussed with son. The plan at this point is to continue to observe her and return precautions were discussed. He is comfortable with this.   Final Clinical Impressions(s) / ED Diagnoses   Final diagnoses:  Dark stools    ED Discharge Orders    None       Raeford Razor, MD 09/25/17 Paulo Fruit

## 2017-09-25 NOTE — Telephone Encounter (Signed)
Will monitor chart for ED arrival.  

## 2017-09-25 NOTE — ED Triage Notes (Signed)
Patient here from home with complaints of abdominal pain. Home health nurse came out today and patient had dark tarry stools. PCP recommended coming to ER. Denies nausea/vomiting. Hx of dementia.

## 2017-09-25 NOTE — Telephone Encounter (Signed)
The [facility] nurse Maryln GottronNicole Frye, RN-BSN called to report the patient's abdominal pain. I asked her to put her son Gerlene BurdockRichard on the phone, he got on the phone and said "she is having abdominal pain and she had a black bowel movement about 25-30 minutes ago." I asked is it ok to talk to [place] Ranch[place]ole, he said "yes." Joni Reiningicole explained that when she arrived, the patient's son told her that the patient is not doing good today. He said that her mentation is off, she's not eating like she normally does. Joni Reiningicole said she asked the patient was she hurting and the patient became tearful and nodding her head yes. According to protocol, go to ED, patient's son advised to take the patient to the ED, care advice given, he verbalized understanding.   Reason for Disposition . Black or tarry bowel movements  (Exception: chronic-unchanged  black-grey bowel movements AND is taking iron pills or Pepto-bismol)  Answer Assessment - Initial Assessment Questions 1. LOCATION: "Where does it hurt?"      Lower abdomen 2. RADIATION: "Does the pain shoot anywhere else?" (e.g., chest, back)     Unknown 3. ONSET: "When did the pain begin?" (e.g., minutes, hours or days ago)      Constant 4. SUDDEN: "Gradual or sudden onset?"     Gradual 5. PATTERN "Does the pain come and go, or is it constant?"    - If constant: "Is it getting better, staying the same, or worsening?"      (Note: Constant means the pain never goes away completely; most serious pain is constant and it progresses)     - If intermittent: "How long does it last?" "Do you have pain now?"     (Note: Intermittent means the pain goes away completely between bouts)     constant 6. SEVERITY: "How bad is the pain?"  (e.g., Scale 1-10; mild, moderate, or severe)   - MILD (1-3): doesn't interfere with normal activities, abdomen soft and not tender to touch    - MODERATE (4-7): interferes with normal activities or awakens from sleep, tender to touch    - SEVERE (8-10): excruciating  pain, doubled over, unable to do any normal activities      Moderate-tearful 7. RECURRENT SYMPTOM: "Have you ever had this type of abdominal pain before?" If so, ask: "When was the last time?" and "What happened that time?"      Yes 8. CAUSE: "What do you think is causing the abdominal pain?"     Unknown 9. RELIEVING/AGGRAVATING FACTORS: "What makes it better or worse?" (e.g., movement, antacids, bowel movement)    Nothing 10. OTHER SYMPTOMS: "Has there been any vomiting, diarrhea, constipation, or urine problems?"       Had a black bowel movement 11. PREGNANCY: "Is there any chance you are pregnant?" "When was your last menstrual period?"       N/A  Protocols used: [facility]

## 2017-09-26 LAB — ABO/RH: ABO/RH(D): O NEG

## 2017-09-30 ENCOUNTER — Telehealth: Payer: Self-pay | Admitting: Internal Medicine

## 2017-09-30 NOTE — Telephone Encounter (Signed)
Copied from CRM [phone]. Topic: General - Other >> Sep 30, 2017  2:04 PM Anice PaganiniMunoz, Keshara Kiger I, NT wrote: Reason for CRM: [facility] called  and state pt son cancel App for Today do to weather for more inf call K   [phone]

## 2017-09-30 NOTE — Telephone Encounter (Signed)
Noted  

## 2017-10-26 ENCOUNTER — Other Ambulatory Visit: Payer: Self-pay | Admitting: Internal Medicine

## 2017-10-27 NOTE — Telephone Encounter (Signed)
Dr K pt 

## 2017-11-04 ENCOUNTER — Ambulatory Visit: Payer: Self-pay

## 2017-11-04 NOTE — Telephone Encounter (Signed)
Son states she has had pain under left breast "for a long time." Comes and goes. Will try home remedies. Seems to be worse after she has been laying down and gets up.  Reason for Disposition . Chest pain(s) lasting a few seconds  Answer Assessment - Initial Assessment Questions 1. LOCATION: "Where does it hurt?"       Under left breast 2. RADIATION: "Does the pain go anywhere else?" (e.g., into neck, jaw, arms, back)     No 3. ONSET: "When did the chest pain begin?" (Minutes, hours or days)      A long time ago 4. PATTERN "Does the pain come and go, or has it been constant since it started?"  "Does it get worse with exertion?"      Comes and goes - worse after laying down 5. DURATION: "How long does it last" (e.g., seconds, minutes, hours)     Lasts about 10 minutes 6. SEVERITY: "How bad is the pain?"  (e.g., Scale 1-10; mild, moderate, or severe)    - MILD (1-3): doesn't interfere with normal activities     - MODERATE (4-7): interferes with normal activities or awakens from sleep    - SEVERE (8-10): excruciating pain, unable to do any normal activities       Mild 7. CARDIAC RISK FACTORS: "Do you have any history of heart problems or risk factors for heart disease?" (e.g., prior heart attack, angina; high blood pressure, diabetes, being overweight, high cholesterol, smoking, or strong family history of heart disease)     No 8. PULMONARY RISK FACTORS: "Do you have any history of lung disease?"  (e.g., blood clots in lung, asthma, emphysema, birth control pills)     No 9. CAUSE: "What do you think is causing the chest pain?"     Unsure - son states doctor knows. 10. OTHER SYMPTOMS: "Do you have any other symptoms?" (e.g., dizziness, nausea, vomiting, sweating, fever, difficulty breathing, cough)       No 11. PREGNANCY: "Is there any chance you are pregnant?" "When was your last menstrual period?"       No  Protocols used: CHEST PAIN-A-AH

## 2017-11-11 ENCOUNTER — Encounter: Payer: PPO | Admitting: *Deleted

## 2017-11-11 ENCOUNTER — Telehealth: Payer: Self-pay | Admitting: Cardiology

## 2017-11-11 NOTE — Telephone Encounter (Signed)
Confirmed remote transmission w/ pt son.    

## 2017-11-13 ENCOUNTER — Encounter: Payer: Self-pay | Admitting: Cardiology

## 2017-11-19 ENCOUNTER — Ambulatory Visit: Payer: PPO | Admitting: Family Medicine

## 2017-11-19 DIAGNOSIS — Z0289 Encounter for other administrative examinations: Secondary | ICD-10-CM

## 2017-11-20 ENCOUNTER — Ambulatory Visit (INDEPENDENT_AMBULATORY_CARE_PROVIDER_SITE_OTHER): Payer: PPO | Admitting: *Deleted

## 2017-11-20 DIAGNOSIS — R001 Bradycardia, unspecified: Secondary | ICD-10-CM | POA: Diagnosis not present

## 2017-11-21 ENCOUNTER — Other Ambulatory Visit: Payer: Self-pay | Admitting: Internal Medicine

## 2017-11-21 ENCOUNTER — Encounter: Payer: Self-pay | Admitting: Cardiology

## 2017-11-21 NOTE — Progress Notes (Signed)
Remote pacemaker transmission.   

## 2017-11-22 ENCOUNTER — Emergency Department (HOSPITAL_COMMUNITY)
Admission: EM | Admit: 2017-11-22 | Discharge: 2017-11-23 | Disposition: A | Discharge status: Home / Self Care | Payer: PPO | Attending: Emergency Medicine | Admitting: Emergency Medicine

## 2017-11-22 ENCOUNTER — Emergency Department (HOSPITAL_COMMUNITY): Payer: PPO

## 2017-11-22 ENCOUNTER — Encounter (HOSPITAL_COMMUNITY): Payer: Self-pay | Admitting: Emergency Medicine

## 2017-11-22 ENCOUNTER — Other Ambulatory Visit: Payer: Self-pay

## 2017-11-22 DIAGNOSIS — E119 Type 2 diabetes mellitus without complications: Secondary | ICD-10-CM | POA: Insufficient documentation

## 2017-11-22 DIAGNOSIS — Z79899 Other long term (current) drug therapy: Secondary | ICD-10-CM | POA: Diagnosis not present

## 2017-11-22 DIAGNOSIS — F329 Major depressive disorder, single episode, unspecified: Secondary | ICD-10-CM | POA: Diagnosis not present

## 2017-11-22 DIAGNOSIS — R4689 Other symptoms and signs involving appearance and behavior: Secondary | ICD-10-CM | POA: Diagnosis present

## 2017-11-22 DIAGNOSIS — I10 Essential (primary) hypertension: Secondary | ICD-10-CM | POA: Diagnosis not present

## 2017-11-22 DIAGNOSIS — F0391 Unspecified dementia with behavioral disturbance: Secondary | ICD-10-CM | POA: Diagnosis not present

## 2017-11-22 DIAGNOSIS — R402441 Other coma, without documented Glasgow coma scale score, or with partial score reported, in the field [EMT or ambulance]: Secondary | ICD-10-CM | POA: Diagnosis not present

## 2017-11-22 DIAGNOSIS — F0390 Unspecified dementia without behavioral disturbance: Secondary | ICD-10-CM | POA: Diagnosis not present

## 2017-11-22 DIAGNOSIS — Z950 Presence of cardiac pacemaker: Secondary | ICD-10-CM | POA: Diagnosis not present

## 2017-11-22 DIAGNOSIS — F918 Other conduct disorders: Secondary | ICD-10-CM | POA: Diagnosis not present

## 2017-11-22 DIAGNOSIS — F03918 Unspecified dementia, unspecified severity, with other behavioral disturbance: Secondary | ICD-10-CM | POA: Insufficient documentation

## 2017-11-22 DIAGNOSIS — F4325 Adjustment disorder with mixed disturbance of emotions and conduct: Secondary | ICD-10-CM | POA: Diagnosis not present

## 2017-11-22 LAB — RAPID URINE DRUG SCREEN, HOSP PERFORMED
Amphetamines: NOT DETECTED
Barbiturates: NOT DETECTED
Benzodiazepines: POSITIVE — AB
Cocaine: NOT DETECTED
OPIATES: NOT DETECTED
TETRAHYDROCANNABINOL: NOT DETECTED

## 2017-11-22 LAB — COMPREHENSIVE METABOLIC PANEL
ALK PHOS: 44 U/L (ref 38–126)
ALT: 10 U/L — AB (ref 14–54)
AST: 14 U/L — ABNORMAL LOW (ref 15–41)
Albumin: 3.5 g/dL (ref 3.5–5.0)
Anion gap: 11 (ref 5–15)
BUN: 23 mg/dL — ABNORMAL HIGH (ref 6–20)
CALCIUM: 8.7 mg/dL — AB (ref 8.9–10.3)
CO2: 26 mmol/L (ref 22–32)
CREATININE: 1.07 mg/dL — AB (ref 0.44–1.00)
Chloride: 102 mmol/L (ref 101–111)
GFR, EST AFRICAN AMERICAN: 51 mL/min — AB (ref >60)
GFR, EST NON AFRICAN AMERICAN: 44 mL/min — AB (ref >60)
Glucose, Bld: 107 mg/dL — ABNORMAL HIGH (ref 65–99)
Potassium: 4.1 mmol/L (ref 3.5–5.1)
Sodium: 139 mmol/L (ref 135–145)
Total Bilirubin: 0.6 mg/dL (ref 0.3–1.2)
Total Protein: 7.1 g/dL (ref 6.5–8.1)

## 2017-11-22 LAB — CBC
HCT: 38.6 % (ref 36.0–46.0)
Hemoglobin: 12.1 g/dL (ref 12.0–15.0)
MCH: 29.5 pg (ref 26.0–34.0)
MCHC: 31.3 g/dL (ref 30.0–36.0)
MCV: 94.1 fL (ref 78.0–100.0)
PLATELETS: 247 10*3/uL (ref 150–400)
RBC: 4.1 MIL/uL (ref 3.87–5.11)
RDW: 13.7 % (ref 11.5–15.5)
WBC: 13.2 10*3/uL — ABNORMAL HIGH (ref 4.0–10.5)

## 2017-11-22 LAB — URINALYSIS, ROUTINE W REFLEX MICROSCOPIC
BACTERIA UA: NONE SEEN
Bilirubin Urine: NEGATIVE
GLUCOSE, UA: NEGATIVE mg/dL
Hgb urine dipstick: NEGATIVE
Ketones, ur: NEGATIVE mg/dL
Leukocytes, UA: NEGATIVE
NITRITE: NEGATIVE
PROTEIN: NEGATIVE mg/dL
Specific Gravity, Urine: 1.021 (ref 1.005–1.030)
pH: 5 (ref 5.0–8.0)

## 2017-11-22 LAB — SALICYLATE LEVEL

## 2017-11-22 LAB — ACETAMINOPHEN LEVEL: Acetaminophen (Tylenol), Serum: <10 ug/mL — ABNORMAL LOW (ref 10–30)

## 2017-11-22 LAB — ETHANOL

## 2017-11-22 NOTE — ED Notes (Signed)
Patient transported to X-ray 

## 2017-11-22 NOTE — ED Notes (Signed)
Dr. Floyd at bedside. 

## 2017-11-22 NOTE — ED Notes (Signed)
Bed: RESB Expected date:  Expected time:  Means of arrival:  Comments: EMS 84 yo female from home-banging on neighbors windows and doors
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
## 2017-11-22 NOTE — ED Notes (Signed)
Pt placed in gown and belongings placed in bag.
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
## 2017-11-23 ENCOUNTER — Encounter (HOSPITAL_COMMUNITY): Payer: Self-pay | Admitting: Behavioral Health

## 2017-11-23 DIAGNOSIS — F4325 Adjustment disorder with mixed disturbance of emotions and conduct: Secondary | ICD-10-CM | POA: Diagnosis present

## 2017-11-23 LAB — CBG MONITORING, ED: Glucose-Capillary: 103 mg/dL — ABNORMAL HIGH (ref 65–99)

## 2017-11-23 MED ORDER — SERTRALINE HCL 50 MG PO TABS
50.0000 mg | ORAL_TABLET | Freq: Every day | ORAL | Status: DC
  Administered 2017-11-23: 50 mg via ORAL
  Filled 2017-11-23: qty 1

## 2017-11-23 MED ORDER — FUROSEMIDE 40 MG PO TABS
20.0000 mg | ORAL_TABLET | Freq: Every day | ORAL | Status: DC
  Administered 2017-11-23: 20 mg via ORAL
  Filled 2017-11-23: qty 1

## 2017-11-23 MED ORDER — SIMVASTATIN 40 MG PO TABS: 40.0000 mg | ORAL_TABLET | Freq: Every day | ORAL | Status: DC

## 2017-11-23 MED ORDER — LORAZEPAM 2 MG/ML IJ SOLN
1.0000 mg | Freq: Once | INTRAMUSCULAR | Status: AC
  Administered 2017-11-23: 1 mg via INTRAMUSCULAR
  Filled 2017-11-23: qty 1

## 2017-11-23 MED ORDER — LORAZEPAM 0.5 MG PO TABS
0.5000 mg | ORAL_TABLET | Freq: Three times a day (TID) | ORAL | Status: DC | PRN
  Administered 2017-11-23: 0.5 mg via ORAL
  Filled 2017-11-23: qty 1

## 2017-11-23 MED ORDER — FAMOTIDINE 20 MG PO TABS
20.0000 mg | ORAL_TABLET | Freq: Two times a day (BID) | ORAL | Status: DC
  Administered 2017-11-23: 20 mg via ORAL
  Filled 2017-11-23: qty 1

## 2017-11-23 NOTE — ED Notes (Signed)
She is assisted to the b.r. By her sitter. Afterward, she is given breakfast. She is confused and mildly agitated.
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
## 2017-11-23 NOTE — ED Notes (Signed)
Pt becoming increasingly anxious and confused to situation. Pt reoriented multiple times by staff.

## 2017-11-23 NOTE — ED Notes (Signed)
She remains active and mildly agitated in the presence of her sitter. She is quite verbose. Our social worker has spoken with her son and I am awaiting to hear back from that conversation as to getting her home, etc.

## 2017-11-23 NOTE — Progress Notes (Signed)
Agitated at home, calm and cooperative in the ED with no suicidal/homicidal ideations, hallucinations, or substance abuse.  Does not meet the criteria for geriatric psychiatry.  Stable for discharge.  The social worker talked to the son about how to get her to a facility or memory care center.    Cindy MeansJamison Emran [name], PMHNP

## 2017-11-23 NOTE — ED Notes (Signed)
Pt agitated at this time and trying to rip pulse ox off. Pt assisted from chair to bed after getting out of bed. Security at bedside to assist pt back into bed along with nursing staff.

## 2017-11-23 NOTE — [facility] Suicide Risk Assessment (Signed)
Suicide Risk Assessment  Discharge Assessment   [facility]   Principal Problem: Adjustment disorder with mixed disturbance of emotions and conduct Discharge Diagnoses:  Patient Active Problem List   Diagnosis Date Noted  . Adjustment disorder with mixed disturbance of emotions and conduct [F43.25] 11/23/2017    Priority: High  . UTI (urinary tract infection) [N39.0] 08/19/2017  . Dementia with behavioral disturbance [F03.91] 08/19/2017  . Anxiety [F41.9] 10/27/2012  . Symptomatic bradycardia [R00.1] 10/01/2011  . Status post cardiac pacemaker procedure [Z95.0] 10/01/2011  . Atrial fibrillation (HCC) [I48.91] 09/29/2011  . Pain disorder [R52] 04/19/2011  . Chest pain, atypical [R07.89] 02/20/2011  . DEPRESSIVE DISORDER [F32.9] 09/19/2010  . LOSS OF WEIGHT [R63.4] 05/30/2010  . ANEMIA, DEFICIENCY, HX OF [Z86.2] 03/01/2010  . INSOMNIA [G47.00] 11/23/2008  . Dyslipidemia [E78.5] 03/17/2008  . Diabetes mellitus without complication (HCC) [E11.9] 05/18/2007  . Essential hypertension [I10] 05/18/2007  . GERD [K21.9] 05/18/2007    Total Time spent with patient: 45 minutes  Musculoskeletal: Strength & Muscle Tone: within normal limits Gait & Station: normal Patient leans: N/A  Psychiatric Specialty Exam:   Blood pressure (!) 152/62, pulse 64, temperature 98.3 F (36.8 C), temperature source Oral, resp. rate 18, height 5\' 3"  (1.6 m), weight 68 kg (150 lb), SpO2 100 %.Body mass index is 26.57 kg/m.  General Appearance: Casual  Eye Contact::  Good  Speech:  Normal Rate409  Volume:  Normal  Mood:  Euthymic  Affect:  Congruent  Thought Process:  Coherent and Descriptions of Associations: Intact  Orientation:  Other:  person  Thought Content:  WDL and Logical  Suicidal Thoughts:  No  Homicidal Thoughts:  No  Memory:  Immediate;   Fair Recent;   Poor Remote;   Poor  Judgement:  Fair  Insight:  Fair  Psychomotor Activity:  Normal  Concentration:   Good  Recall:  Poor  Fund of Knowledge:Fair  Language: Good  Akathisia:  No  Handed:  Right  AIMS (if indicated):     Assets:  Housing Leisure Time Resilience Social Support  Sleep:     Cognition: Impaired,  Moderate  ADL's:  Intact   Mental Status Per Nursing Assessment::   On Admission:   Agitated at home, calm and cooperative in the ED with no suicidal/homicidal ideations, hallucinations, or substance abuse.  Does not meet the criteria for geriatric psychiatry.  Stable for discharge.  The social worker talked to the son about how to get her to a facility or memory care center.    Demographic Factors:  Age 84 or older and Caucasian  Loss Factors: NA  Historical Factors: NA  Risk Reduction Factors:   Sense of responsibility to family, Living with another person, especially a relative and Positive social support  Continued Clinical Symptoms:  None   Cognitive Features That Contribute To Risk:  None    Suicide Risk:  Minimal: No identifiable suicidal ideation.  Patients presenting with no risk factors but with morbid ruminations; may be classified as minimal risk based on the severity of the depressive symptoms    Plan Of Care/Follow-up recommendations:  Activity:  as tolerated Diet:  heart healthy diet  LORD, JAMISON, NP 11/23/2017, 11:16 AM

## 2017-11-23 NOTE — ED Notes (Signed)
Our social worker spoke with her son at length; and he accompanied her home via taxi. (They both went in the taxi). He did receive the information packet, and verbalizes understanding of same. I was unable to get the [employer] device to work, so no signature obtained.

## 2017-11-23 NOTE — Progress Notes (Signed)
Consult request has been received. CSW attempting to follow up at present time. Per psychiatry pt is psychiatrically cleared.  CSW was informed pt's son states he does not drive and cannot pick up his mother.  CSW spoke to pt's son, informed him of the cost of a taxi, encouraged him to pick up his mother. Pt arrived by taxi to pick up his mother.  CSW counseled pt's son on SNF's, ALF's and Memory Care, how they work, who is qualified to go there, insurances that do and don't pay for them and how it is paid for otherwisw.  .CSW provided pt's son with a list of Assisted Living Facilities in the [place] area and counseled pt's son on seeking placement for his mother by going to pt's PCP and requesting a signed FL-2 to be sent to the facility of his and his mother's choice. CSW cautioned pt's son that pt may be required to request that the PCP recommend a neurologist to diagnose the pt with dementia should no diagnosis be present for the pt.  Pt's son was appreciative and thanked the CSW.  Please reconsult if future social work needs arise.  CSW signing off, as social work intervention is no longer needed.  Dorothe PeaJonathan F. Linnaea Ahn, LCSW, LCAS, CSI Clinical Social Worker Ph: [phone]

## 2017-11-23 NOTE — ED Notes (Signed)
Pt belongings removed from room after pt attempting to leave. Belongings placed at nursing station on 13-18 side in cabinet with patient label.

## 2017-11-24 ENCOUNTER — Telehealth: Payer: Self-pay | Admitting: Internal Medicine

## 2017-11-24 ENCOUNTER — Telehealth: Payer: Self-pay | Admitting: Family Medicine

## 2017-11-24 NOTE — Telephone Encounter (Signed)
Called pharmacy no answer. Will try again shortly.

## 2017-11-24 NOTE — Telephone Encounter (Signed)
Copied from CRM [id number]. Topic: Quick Communication - See Telephone Encounter >> Nov 24, 2017 10:01 AM Lelon FrohlichGolden, Tashia, RMA wrote: CRM for notification. See Telephone encounter for: 11/24/17.pt son called and stated that he needs an FL-2 form filled out in order to have placed into a facility for alzheimer and I informed pt that he would need to make appt for pt and he stated that he does not have transportation please contact pt to discuss what else can be done [phone]

## 2017-11-24 NOTE — Telephone Encounter (Signed)
Copied from CRM [phone]. Topic: General - Other >> Nov 24, 2017  2:02 PM Gerrianne ScalePayne, Angela L wrote: Reason for CRM: [facility] calling stating that they need the office to refax the traMADol (ULTRAM) 50 MG tablet  They cant see it on there end

## 2017-11-25 NOTE — Telephone Encounter (Signed)
Spoke to pharmacist. Medication phone in. No further action needed.

## 2017-11-25 NOTE — Telephone Encounter (Signed)
Called pharmacy, pharmacist was on another call. Will call back later.

## 2017-11-25 NOTE — Telephone Encounter (Signed)
Spoke to patient's son because Dr.Kwiatkowski was under the impression that the patient was already in a home. Patient son denied that this was true. Informed him that Dr.Kwiatkowski will fill it out.

## 2017-12-01 ENCOUNTER — Telehealth: Payer: Self-pay | Admitting: Internal Medicine

## 2017-12-01 NOTE — Telephone Encounter (Signed)
Please call in a new prescription for Quetiap 25 mg #60 one at bedtime

## 2017-12-01 NOTE — Telephone Encounter (Signed)
Noted routing to Dr.Kwiatkowski 

## 2017-12-01 NOTE — Telephone Encounter (Signed)
Copied from CRM [id number]. Topic: Quick Communication - See Telephone Encounter >> Dec 01, 2017  1:05 PM Cipriano BunkerLambe, Annette S wrote: CRM for notification.  [name], son called and is saying that her prescription  LORazepam (ATIVAN) 0.5 MG tablet is not working and not doing any good.   Wanting to know if anything else would help to calm her down.  He is having a hard time getting her to go to dr. Appt's or anything.   Not sure what to do or say to her.  He is frustrated on what to do.   She jumped out of her bedroom window and went to Er. 3/23 Not sure if they addressed her knee.   Has appt. 4/8 but not sure if he can get her there.   He was very frustrated with the Child psychotherapistsocial worker when she was telling them about what he can do,  at the hospital. He is needing help.  Wanting to know details,  He does not know how to deal with Alzheimer's    Please call [name] if can call today!!!! Even if after 4:30  See Telephone encounter for: 12/01/17.

## 2017-12-03 MED ORDER — QUETIAPINE FUMARATE 25 MG PO TABS: 25.0000 mg | ORAL_TABLET | Freq: Every day | ORAL | 0 refills | Status: DC

## 2017-12-03 NOTE — Telephone Encounter (Signed)
Spoke to patient's son informing him that a prescription was sent in. He verbalized and understood instructions. No further action needed.

## 2017-12-08 ENCOUNTER — Ambulatory Visit: Payer: PPO | Admitting: Internal Medicine

## 2017-12-08 DIAGNOSIS — Z0289 Encounter for other administrative examinations: Secondary | ICD-10-CM

## 2017-12-12 LAB — CUP PACEART REMOTE DEVICE CHECK
Battery Remaining Longevity: 66 mo
Brady Statistic RA Percent Paced: 42 %
Brady Statistic RV Percent Paced: 0 %
Implantable Lead Implant Date: 20130128
Implantable Lead Location: 753859
Implantable Lead Model: 4135
Implantable Lead Model: 4136
Implantable Lead Serial Number: 29118103
Lead Channel Pacing Threshold Amplitude: 0.5 V
Lead Channel Setting Pacing Amplitude: 2 V
Lead Channel Setting Pacing Amplitude: 2.4 V
Lead Channel Setting Pacing Pulse Width: 0.4 ms
Lead Channel Setting Sensing Sensitivity: 2.5 mV
MDC IDC LEAD IMPLANT DT: 20130128
MDC IDC LEAD LOCATION: 753860
MDC IDC LEAD SERIAL: 29117362
MDC IDC MSMT BATTERY REMAINING PERCENTAGE: 98 %
MDC IDC MSMT LEADCHNL RA IMPEDANCE VALUE: 540 Ohm
MDC IDC MSMT LEADCHNL RA PACING THRESHOLD PULSEWIDTH: 0.4 ms
MDC IDC MSMT LEADCHNL RV IMPEDANCE VALUE: 734 Ohm
MDC IDC PG IMPLANT DT: 20130128
MDC IDC PG SERIAL: 123684
MDC IDC SESS DTM: 20190401120900

## 2017-12-16 ENCOUNTER — Ambulatory Visit: Payer: PPO | Admitting: Internal Medicine

## 2017-12-30 ENCOUNTER — Other Ambulatory Visit: Payer: Self-pay | Admitting: Internal Medicine

## 2018-01-01 ENCOUNTER — Other Ambulatory Visit: Payer: Self-pay

## 2018-01-01 MED ORDER — SIMVASTATIN 40 MG PO TABS: 40.0000 mg | ORAL_TABLET | Freq: Every day | ORAL | 2 refills | Status: DC

## 2018-01-07 ENCOUNTER — Other Ambulatory Visit: Payer: Self-pay | Admitting: Internal Medicine

## 2018-01-09 ENCOUNTER — Telehealth: Payer: Self-pay | Admitting: Internal Medicine

## 2018-01-09 NOTE — Telephone Encounter (Signed)
Copied from CRM [id number]. Topic: Quick Communication - See Telephone Encounter >> Jan 09, 2018  3:45 PM Cipriano Bunker wrote: CRM for notification. See Telephone encounter for: 01/09/18.  LORazepam (ATIVAN) 0.5 MG tablet and QUEtiapine (SEROQUEL) 25 MG tablet,  this has not been helping.  Waking up in the night last 5 nights, saying chest hurting. He has taken her BP and is fine 123/67 average. The machine that has been heart monitoring her has been ok .  Any pain she gets excited and starts crying  He says there is no other signs that are alarming.   He is asking for prescription that may help calm her down where she would be more acceptable for someone staying her.   He was told by health care person to come see doctor.  Son, [name] is asking if needs to come in and if so can get a afternoon appt. After 1 and before may 20th if at all possible

## 2018-01-11 LAB — BLOOD GAS, VENOUS
ACID-BASE EXCESS: 1.5 mmol/L (ref 0.0–2.0)
Acid-base deficit: 1.8 mmol/L (ref 0.0–2.0)
Bicarbonate: 26.1 mmol/L (ref 20.0–28.0)
Drawn by: 45530
O2 Saturation: 53.8 %
PATIENT TEMPERATURE: 98.6
pCO2, Ven: 43.3 mmHg — ABNORMAL LOW (ref 44.0–60.0)
pH, Ven: 7.398 (ref 7.250–7.430)

## 2018-01-12 NOTE — Telephone Encounter (Signed)
Pt son is aware waiting on md to address

## 2018-01-13 NOTE — Telephone Encounter (Signed)
Please advise 

## 2018-01-14 ENCOUNTER — Encounter: Payer: Self-pay | Admitting: Internal Medicine

## 2018-01-14 ENCOUNTER — Ambulatory Visit (INDEPENDENT_AMBULATORY_CARE_PROVIDER_SITE_OTHER): Payer: PPO | Admitting: Internal Medicine

## 2018-01-14 VITALS — BP 120/60 | HR 59 | Temp 98.7°F | Wt 150.0 lb

## 2018-01-14 DIAGNOSIS — I10 Essential (primary) hypertension: Secondary | ICD-10-CM | POA: Diagnosis not present

## 2018-01-14 DIAGNOSIS — E785 Hyperlipidemia, unspecified: Secondary | ICD-10-CM

## 2018-01-14 DIAGNOSIS — F0151 Vascular dementia with behavioral disturbance: Secondary | ICD-10-CM | POA: Diagnosis not present

## 2018-01-14 DIAGNOSIS — F01518 Vascular dementia, unspecified severity, with other behavioral disturbance: Secondary | ICD-10-CM

## 2018-01-14 MED ORDER — LORAZEPAM 0.5 MG PO TABS: 0.5000 mg | ORAL_TABLET | Freq: Three times a day (TID) | ORAL | 0 refills | Status: DC | PRN

## 2018-01-14 MED ORDER — QUETIAPINE FUMARATE 50 MG PO TABS: 25.0000 mg | ORAL_TABLET | Freq: Every day | ORAL | 3 refills | Status: DC

## 2018-01-14 NOTE — Patient Instructions (Signed)
Discontinue furosemide  Increase Seroquel to 50 mg at bedtime  (okay to take two  25 mg tablets until present supply completed increase lorazepam to)  Increase lorazepam  To 2 tablets at bedtime as needed

## 2018-01-14 NOTE — Progress Notes (Signed)
Subjective:    Patient ID: [name], female    DOB: 1923/05/09, 84 y.o.   MRN: [id number]  HPI  84 year old patient who is seen today accompanied by her son. She has a history of senile dementia with behavioral issues.  More recently she has had considerable agitation at night.  She becomes quite distressed in spite of some maintenance medication.  Her son who is a primary caregiver is getting very little sleep.  She has a history of essential hypertension. Maintenance medications include low-dose Seroquel 25 mg at bedtime as well as lorazepam 0.5 mg 3 times daily as needed.  She has a history of essential hypertension and has been on both furosemide and lisinopril.  Furosemide discontinued today  Past Medical History:  Diagnosis Date  . ANEMIA, DEFICIENCY, HX OF 03/01/2010  . Anxiety   . DEPRESSIVE DISORDER 09/19/2010  . DM w/o Complication Type II 05/18/2007  . GERD 05/18/2007  . Glaucoma   . HYPERLIPIDEMIA 03/17/2008  . HYPERTENSION 05/18/2007  . INSOMNIA 11/23/2008  . Kidney stone   . Loss of weight 05/30/2010     Social History   Socioeconomic History  . Marital status: Widowed    Spouse name: Not on file  . Number of children: 3  . Years of education: Not on file  . Highest education level: Not on file  Occupational History  . Occupation: retired  Engineer, production  . Financial resource strain: Not on file  . Food insecurity:    Worry: Not on file    Inability: Not on file  . Transportation needs:    Medical: Not on file    Non-medical: Not on file  Tobacco Use  . Smoking status: Never Smoker  . Smokeless tobacco: Never Used  Substance and Sexual Activity  . Alcohol use: No  . Drug use: No  . Sexual activity: Not on file  Lifestyle  . Physical activity:    Days per week: Not on file    Minutes per session: Not on file  . Stress: Not on file  Relationships  . Social connections:    Talks on phone: Not on file    Gets together: Not on file    Attends  religious service: Not on file    Active member of club or organization: Not on file    Attends meetings of clubs or organizations: Not on file    Relationship status: Not on file  . Intimate partner violence:    Fear of current or ex partner: Not on file    Emotionally abused: Not on file    Physically abused: Not on file    Forced sexual activity: Not on file  Other Topics Concern  . Not on file  Social History Narrative  . Not on file    Past Surgical History:  Procedure Laterality Date  . ABDOMINAL HYSTERECTOMY    . APPENDECTOMY    . CATARACT EXTRACTION    . COLONOSCOPY  06-28-10   per Dr. Arlyce Dice, diverticulosis only   . DILATION AND CURETTAGE OF UTERUS    . ESOPHAGOGASTRODUODENOSCOPY  05-13-11   per Dr. Arlyce Dice, normal   . HEMORRHOID SURGERY    . PACEMAKER INSERTION  09/2011  . PERMANENT PACEMAKER INSERTION N/A 09/30/2011   Procedure: PERMANENT PACEMAKER INSERTION;  Surgeon: Marinus Maw, MD;  Location[facility] C[facility] LAB;  Service: Cardiovascular;  Laterality: N/A;    Family History  Problem Relation Age of Onset  . Unexplained death Mother   .  Brain cancer Father        brain tumor  . Colon cancer Sister   . Colitis Brother   . Heart attack Brother     Allergies  Allergen Reactions  . Amoxicillin Rash  . Penicillins Rash    Has patient had a PCN reaction causing immediate rash, facial/tongue/throat swelling, SOB or lightheadedness with hypotension: unknown Has patient had a PCN reaction causing severe rash involving mucus membranes or skin necrosis: unkonwn Has patient had a PCN reaction that required hospitalization unknown Has patient had a PCN reaction occurring within the last 10 years: No If all of the above ansers are "NO", then may proceed with Cephalosporin use.     Current Outpatient Medications on File Prior to Visit  Medication Sig Dispense Refill  . acetaminophen (TYLENOL) 500 MG tablet Take 1,000 mg by mouth every 6 (six) hours as needed for moderate  pain (pain). For pain    . famotidine (PEPCID) 20 MG tablet TAKE 1 TABLET BY MOUTH TWICE DAILY 180 tablet 1  . lisinopril (PRINIVIL,ZESTRIL) 10 MG tablet Take 1 tablet (10 mg total) by mouth daily. 90 tablet 3  . Multiple Vitamin (MULTIVITAMIN WITH MINERALS) TABS tablet Take 1 tablet by mouth daily.    . sertraline (ZOLOFT) 50 MG tablet Take 1 tablet (50 mg total) by mouth daily. 30 tablet 2  . simvastatin (ZOCOR) 40 MG tablet Take 1 tablet (40 mg total) by mouth daily at 6 PM. 30 tablet 2  . traMADol (ULTRAM) 50 MG tablet TAKE 1 TABLET BY MOUTH EVERY 8 HOURS AS NEEDED FOR PAIN 90 tablet 0   No current facility-administered medications on file prior to visit.     BP 120/60 (BP Location: Right Arm, Patient Position: Sitting, Cuff Size: Large)   Pulse (!) 59   Temp 98.7 F (37.1 C) (Oral)   Wt 150 lb (68 kg)   SpO2 99%   BMI 26.57 kg/m     Review of Systems  Constitutional: Positive for fatigue.  HENT: Negative for congestion, dental problem, hearing loss, rhinorrhea, sinus pressure, sore throat and tinnitus.   Eyes: Negative for pain, discharge and visual disturbance.  Respiratory: Negative for cough and shortness of breath.   Cardiovascular: Negative for chest pain, palpitations and leg swelling.  Gastrointestinal: Negative for abdominal distention, abdominal pain, blood in stool, constipation, diarrhea, nausea and vomiting.  Genitourinary: Negative for difficulty urinating, dysuria, flank pain, frequency, hematuria, pelvic pain, urgency, vaginal bleeding, vaginal discharge and vaginal pain.  Musculoskeletal: Negative for arthralgias, gait problem and joint swelling.  Skin: Negative for rash.  Neurological: Negative for dizziness, syncope, speech difficulty, weakness, numbness and headaches.  Hematological: Negative for adenopathy.  Psychiatric/Behavioral: Positive for agitation, behavioral problems, confusion, decreased concentration and sleep disturbance. Negative for dysphoric  mood. The patient is nervous/anxious.        Objective:   Physical Exam  Constitutional: She appears well-developed and well-nourished.  Blood pressure is low normal  HENT:  Head: Normocephalic.  Right Ear: External ear normal.  Left Ear: External ear normal.  Mouth/Throat: Oropharynx is clear and moist.  Eyes: Pupils are equal, round, and reactive to light. Conjunctivae and EOM are normal.  Neck: Normal range of motion. Neck supple. No thyromegaly present.  Cardiovascular: Normal rate, regular rhythm, normal heart sounds and intact distal pulses.  Pulmonary/Chest: Effort normal and breath sounds normal.  Abdominal: Soft. Bowel sounds are normal. She exhibits no mass. There is no tenderness.  Musculoskeletal: Normal range of motion.  Lymphadenopathy:    She has no cervical adenopathy.  Neurological: She is alert.  Skin: Skin is warm and dry. No rash noted.  Psychiatric:  Patient is quite confused but pleasant and presently not agitated She is alert            Assessment & Plan:   Senile dementia with behavioral abnormalities.  Discussed with son.  Have elected to increase Seroquel to 50 mg at bedtime.  Will uptitrate the lorazepam slightly on a as needed basis. Essential hypertension.  Blood pressure well controlled today.  Will discontinue furosemide Insomnia History of dyslipidemia Patient has had a recent ED visit.  Laboratory studies were reviewed Follow-up 3 to 6 months or as needed    son encouraged to sign up for my chart to assist with easier and more timely communication  Rogelia Boga

## 2018-01-14 NOTE — Telephone Encounter (Signed)
Did you speak to pt about this issue today or do you need me to call her to make sure she is still following this?

## 2018-01-14 NOTE — Telephone Encounter (Signed)
All these issues were addressed at today's office visit.  No reason to make any further calls

## 2018-01-14 NOTE — Telephone Encounter (Signed)
Increase Seroquel to 50 mg daily Increase lorazepam to 1 mg at bedtime

## 2018-01-16 ENCOUNTER — Ambulatory Visit: Payer: Self-pay | Admitting: *Deleted

## 2018-01-16 NOTE — Telephone Encounter (Signed)
Patient's son reports [name] has had 2 soft stools this morning. No other symptoms. Advice care given and symptoms to watch for.   Reason for Disposition . MILD-MODERATE diarrhea (e.g., 1-6 times / day more than normal)  Answer Assessment - Initial Assessment Questions 1. DIARRHEA SEVERITY: "How bad is the diarrhea?" "How many extra stools have you had in the past 24 hours than normal?"    - MILD: Few loose or mushy BMs; increase of 1-3 stools over normal daily number of stools; mild increase in ostomy output.   - MODERATE: Increase of 4-6 stools daily over normal; moderate increase in ostomy output.   - SEVERE (or Worst Possible): Increase of 7 or more stools daily over normal; moderate increase in ostomy output; incontinence.     Mild-2 soft stools 2. ONSET: "When did the diarrhea begin?"      This morning  3. BM CONSISTENCY: "How loose or watery is the diarrhea?"      Just soft, reports the soon. 4. VOMITING: "Are you also vomiting?" If so, ask: "How many times in the past 24 hours?"      none 5. ABDOMINAL PAIN: "Are you having any abdominal pain?" If yes: "What does it feel like?" (e.g., crampy, dull, intermittent, constant)      none 6. ABDOMINAL PAIN SEVERITY: If present, ask: "How bad is the pain?"  (e.g., Scale 1-10; mild, moderate, or severe)    - MILD (1-3): doesn't interfere with normal activities, abdomen soft and not tender to touch     - MODERATE (4-7): interferes with normal activities or awakens from sleep, tender to touch     - SEVERE (8-10): excruciating pain, doubled over, unable to do any normal activities       None 7. ORAL INTAKE: If vomiting, "Have you been able to drink liquids?" "How much fluids have you had in the past 24 hours?"    none 8. HYDRATION: "Any signs of dehydration?" (e.g., dry mouth [not just dry lips], too  weak to stand, dizziness, new weight loss) "When did you last urinate?"     No signs. Voiding today 9. EXPOSURE: "Have you traveled to a  foreign country recently?" "Have you been exposed to anyone with diarrhea?" "Could you have eaten any food that was spoiled?"     no 10. OTHER SYMPTOMS: "Do you have any other symptoms?" (e.g., fever, blood in stool)       no 11. PREGNANCY: "Is there any chance you are pregnant?" "When was your last menstrual period?"       no  Protocols used: DIARRHEA-A-AH

## 2018-01-20 ENCOUNTER — Ambulatory Visit: Payer: Self-pay | Admitting: *Deleted

## 2018-01-20 NOTE — Telephone Encounter (Signed)
Son is calling to report that his mother fell today and scraped her arm. He has cleaned and bandaged the arm. She did not hurt herself anywhere else. Advised home care for scrape and he will call if laceration doesn't heal as expected.  Reason for Disposition . Minor cut or scratch  Answer Assessment - Initial Assessment Questions 1. APPEARANCE of INJURY: "What does the injury look like?"      Patient peeled 1/2 layer- V shape 2. SIZE: "How large is the cut?"      Width of standard band aid 3. BLEEDING: "Is it bleeding now?" If so, ask: "Is it difficult to stop?"      Bleeding has stopped now 4. LOCATION: "Where is the injury located?"      R back of arm above elbow 5. ONSET: "How long ago did the injury occur?"      This morning early 6. MECHANISM: "Tell me how it happened."      Not sure what she hit it on- she fell 7. TETANUS: "When was the last tetanus booster?"     Up to date 8. PREGNANCY: "Is there any chance you are pregnant?" "When was your last menstrual period?"     n/a  Protocols used: CUTS AND LACERATIONS-A-AH

## 2018-01-21 NOTE — Telephone Encounter (Signed)
Noted  

## 2018-02-06 ENCOUNTER — Other Ambulatory Visit: Payer: Self-pay | Admitting: Internal Medicine

## 2018-02-06 NOTE — Telephone Encounter (Signed)
Rx refill request- outside provider: sertraline 50 mg     Last filled 10/26/17 # 30 2 RF ( possibly)  LOV: 01/14/18  PCP: Amador CunasKwiatkowski  Pharmacy: verified

## 2018-02-06 NOTE — Telephone Encounter (Signed)
Dr. Kirtland BouchardK, please advise. A 3 month supply was filled by hospitalist in Dec. 2018.

## 2018-02-06 NOTE — Telephone Encounter (Signed)
Copied from CRM [id number]. Topic: Quick Communication - Rx Refill/Question >> Feb 06, 2018 11:20 AM [name], [name], NT wrote: Medication: sertraline (ZOLOFT) 50 MG tablet   Has the patient contacted their pharmacy? Yes.   (Agent: If no, request that the patient contact the pharmacy for the refill.) (Agent: If yes, when and what did the pharmacy advise?)  Preferred Pharmacy (with phone number or street name): [facility] [address], [place] - [address] [phone] (Phone) [phone] (Fax)      Agent: Please be advised that RX refills may take up to 3 business days. We ask that you follow-up with your pharmacy.

## 2018-02-09 MED ORDER — SERTRALINE HCL 50 MG PO TABS: 50.0000 mg | ORAL_TABLET | Freq: Every day | ORAL | 2 refills | Status: DC

## 2018-02-09 NOTE — Telephone Encounter (Signed)
Okay for refill?  

## 2018-02-17 ENCOUNTER — Other Ambulatory Visit: Payer: Self-pay | Admitting: Internal Medicine

## 2018-02-19 ENCOUNTER — Ambulatory Visit (INDEPENDENT_AMBULATORY_CARE_PROVIDER_SITE_OTHER): Payer: PPO | Admitting: *Deleted

## 2018-02-19 DIAGNOSIS — I480 Paroxysmal atrial fibrillation: Secondary | ICD-10-CM

## 2018-02-19 NOTE — Progress Notes (Signed)
Remote pacemaker transmission.   

## 2018-03-02 ENCOUNTER — Other Ambulatory Visit: Payer: Self-pay | Admitting: Internal Medicine

## 2018-03-10 ENCOUNTER — Telehealth: Payer: Self-pay | Admitting: Internal Medicine

## 2018-03-10 NOTE — Telephone Encounter (Signed)
Please advise 

## 2018-03-10 NOTE — Telephone Encounter (Signed)
Copied from CRM [id number]. Topic: General - Other >> Mar 10, 2018 12:33 PM Stephannie LiSimmons, Katianna Mcclenney L, NT wrote: Reason for CRM: Patients son Gerlene BurdockRichard called and would like for Dr Lesia HausenKwiatowski to fax a letter  to [phone]  to the law office of Lanelle BalCheryl David ,stating that she is incompetent to make medical ,financial or other decisions  on her own , please call  her son  at [phone] with any questions or concerns .

## 2018-03-11 ENCOUNTER — Encounter: Payer: Self-pay | Admitting: Internal Medicine

## 2018-03-11 NOTE — Telephone Encounter (Signed)
Letter printed.

## 2018-03-11 NOTE — Telephone Encounter (Signed)
Pt son notified.

## 2018-03-13 LAB — CUP PACEART REMOTE DEVICE CHECK
Battery Remaining Percentage: 95 %
Brady Statistic RA Percent Paced: 43 %
Date Time Interrogation Session: 20190620041100
Implantable Lead Implant Date: 20130128
Implantable Lead Location: 753859
Implantable Lead Serial Number: 29118103
Lead Channel Impedance Value: 540 Ohm
Lead Channel Setting Pacing Amplitude: 2 V
Lead Channel Setting Pacing Amplitude: 2.4 V
MDC IDC LEAD IMPLANT DT: 20130128
MDC IDC LEAD LOCATION: 753860
MDC IDC LEAD SERIAL: 29117362
MDC IDC MSMT BATTERY REMAINING LONGEVITY: 60 mo
MDC IDC MSMT LEADCHNL RA PACING THRESHOLD AMPLITUDE: 0.5 V
MDC IDC MSMT LEADCHNL RA PACING THRESHOLD PULSEWIDTH: 0.4 ms
MDC IDC MSMT LEADCHNL RV IMPEDANCE VALUE: 754 Ohm
MDC IDC PG IMPLANT DT: 20130128
MDC IDC SET LEADCHNL RV PACING PULSEWIDTH: 0.4 ms
MDC IDC SET LEADCHNL RV SENSING SENSITIVITY: 2.5 mV
MDC IDC STAT BRADY RV PERCENT PACED: 0 %
Pulse Gen Serial Number: 123684

## 2018-04-06 ENCOUNTER — Other Ambulatory Visit: Payer: Self-pay | Admitting: Internal Medicine

## 2018-04-06 ENCOUNTER — Other Ambulatory Visit: Payer: Self-pay

## 2018-04-06 ENCOUNTER — Telehealth: Payer: Self-pay | Admitting: Internal Medicine

## 2018-04-06 MED ORDER — TRAMADOL HCL 50 MG PO TABS: 50.0000 mg | ORAL_TABLET | Freq: Three times a day (TID) | ORAL | 0 refills | Status: DC | PRN

## 2018-04-06 MED ORDER — SIMVASTATIN 40 MG PO TABS: 40.0000 mg | ORAL_TABLET | Freq: Every day | ORAL | 2 refills | Status: DC

## 2018-04-06 MED ORDER — FAMOTIDINE 20 MG PO TABS: 20.0000 mg | ORAL_TABLET | Freq: Two times a day (BID) | ORAL | 1 refills | Status: DC

## 2018-04-06 NOTE — Telephone Encounter (Signed)
Copied from CRM [id number]. Topic: Quick Communication - Rx Refill/Question >> Apr 06, 2018  8:06 AM Henry Russelawoud, Jessica L wrote: Medication: famotidine (PEPCID) 20 MG tablet [[id number]][[id number]], lisinopril (PRINIVIL,ZESTRIL) 10 MG tablet, LORazepam (ATIVAN) 0.5 MG tablet, QUEtiapine (SEROQUEL) 50 MG tablet, sertraline (ZOLOFT) 50 MG tablet, simvastatin (ZOCOR) 40 MG tablet, traMADol (ULTRAM) 50 MG tablet. Pt son would like to have his mother [facility].     Preferred Pharmacy (with phone number or street name): [facility] [place], [place] - [address] [phone] (Phone) [phone] (Fax)  Agent: Please be advised that RX refills may take up to 3 business days. We ask that you follow-up with your pharmacy.

## 2018-04-06 NOTE — Telephone Encounter (Signed)
Rx printed to be fax or phone in.

## 2018-04-06 NOTE — Telephone Encounter (Signed)
LOV  01/14/18  Dr. Amador CunasKwiatkowski  Request refill for ativan 0.5 mg    LR 03/02/18 for #90 tabs Tramadol 50 mg tab    LR 02/18/18  For #90 tabs  Pepcid 20 mg tab and Zocor 50 mg tab pended.   Will notify pharmacy regarding change in new pharmacy, for other medications, lisinopril, seroquel , zoloft,

## 2018-04-06 NOTE — Telephone Encounter (Unsigned)
Copied from CRM [id number]. Topic: Quick Communication - Rx Refill/Question >> Apr 06, 2018 12:48 PM Raquel SarnaHayes, Teresa G wrote: traMADol Janean Sark(ULTRAM) 50 MG tablet  Pt needing new Rx to send for refills to:   [facility] [place] [place] [id number] Phone: [phone] Fax: [phone]

## 2018-04-09 ENCOUNTER — Telehealth: Payer: Self-pay | Admitting: Internal Medicine

## 2018-04-09 NOTE — Telephone Encounter (Signed)
Copied from CRM [id number]. Topic: Quick Communication - Rx Refill/Question >> Apr 09, 2018 10:33 AM Oneal GroutSebastian, Jennifer S wrote: Medication: LORazepam (ATIVAN) 0.5 MG tablet   Has the patient contacted their pharmacy? Yes.   (Agent: If no, request that the patient contact the pharmacy for the refill.) (Agent: If yes, when and what did the pharmacy advise?)  Preferred Pharmacy (with phone number or street name): [facility]  Agent: Please be advised that RX refills may take up to 3 business days. We ask that you follow-up with your pharmacy.

## 2018-04-09 NOTE — Telephone Encounter (Signed)
Lorazepam refill Last OV:01/14/18 Last refill:03/02/18 90 tab/0 refill RUE:[id number]:[facility]: [facility] [place], [place] - [address] [phone] (Phone) [phone] (Fax)

## 2018-04-10 ENCOUNTER — Telehealth: Payer: Self-pay | Admitting: Internal Medicine

## 2018-04-10 ENCOUNTER — Other Ambulatory Visit: Payer: Self-pay

## 2018-04-10 DIAGNOSIS — F0390 Unspecified dementia without behavioral disturbance: Secondary | ICD-10-CM

## 2018-04-10 MED ORDER — LORAZEPAM 0.5 MG PO TABS: 0.5000 mg | ORAL_TABLET | Freq: Three times a day (TID) | ORAL | 0 refills | Status: DC | PRN

## 2018-04-10 NOTE — Telephone Encounter (Signed)
Okay for referral?

## 2018-04-10 NOTE — Telephone Encounter (Signed)
Copied from CRM [id number]. Topic: General - Other >> Apr 10, 2018 12:08 PM Leafy Roobinson, Norma J wrote: Reason for CRM: raina [facility]  and pt son called them and he is requesting home health nurse and nurse aid for his mother. Pt has dementia. Pt son needs helping taking care of her. [facility]  fax number [phone]

## 2018-04-13 NOTE — Telephone Encounter (Signed)
Home health referral placed

## 2018-04-13 NOTE — Telephone Encounter (Signed)
Okay for referral?

## 2018-04-14 ENCOUNTER — Other Ambulatory Visit: Payer: Self-pay

## 2018-04-14 MED ORDER — LORAZEPAM 0.5 MG PO TABS: 0.5000 mg | ORAL_TABLET | Freq: Three times a day (TID) | ORAL | 0 refills | Status: DC | PRN

## 2018-04-14 NOTE — Telephone Encounter (Signed)
Rx sent to corrected pharmacy °

## 2018-04-14 NOTE — Telephone Encounter (Signed)
Copied from CRM [phone] [id number]. Topic: Quick Communication - See Telephone Encounter >> Apr 14, 2018 10:38 AM Tamela OddiMartin, Don'Quashia, NT wrote: CRM for notification. See Telephone encounter for: 04/14/18. Patient son called and states that the patient medication LORazepam (ATIVAN) 0.5 MG tablet was sent to the wrong pharmacy . Please send to  [facility] [place] - [address] [phone] (Phone) [phone] (Fax)

## 2018-05-12 ENCOUNTER — Telehealth: Payer: Self-pay | Admitting: *Deleted

## 2018-05-12 DIAGNOSIS — F0390 Unspecified dementia without behavioral disturbance: Secondary | ICD-10-CM

## 2018-05-12 NOTE — Telephone Encounter (Signed)
Okay for verbal orders? Please advise 

## 2018-05-12 NOTE — Telephone Encounter (Signed)
Copied from CRM [id number]. Topic: Referral - Request >> May 12, 2018 10:33 AM Angela Nevin wrote: Reason for CRM: Pts son called stating that his mother needs a referral for neurology, requesting it be sent to Dr. Patrcia Dolly at [address], [place], [place] [address]. Please advise.

## 2018-05-13 ENCOUNTER — Encounter: Payer: Self-pay | Admitting: Neurology

## 2018-05-13 ENCOUNTER — Other Ambulatory Visit: Payer: Self-pay | Admitting: Internal Medicine

## 2018-05-13 NOTE — Telephone Encounter (Signed)
Okay for verbal orders. 

## 2018-05-13 NOTE — Telephone Encounter (Signed)
Referral placed. No further action needed.  

## 2018-05-19 ENCOUNTER — Telehealth: Payer: Self-pay | Admitting: Internal Medicine

## 2018-05-19 NOTE — Telephone Encounter (Signed)
Copied from CRM [id number]. Topic: Quick Communication - See Telephone Encounter >> May 19, 2018  4:43 PM Jay SchlichterWeikart, Melissa J wrote: CRM for notification. See Telephone encounter for: 05/19/18. Son called - he is having a hard time with pt.  He is not able to get her out of the house much.  He is asking for rx to help calm pt down.  He is getting some help coming into the home but it is not enough.  Pt is extremely irritable,  she doesn't know who her son is.  Son is having a hard time processing the changes that the pt is going through.   [facility]  Pt is on sertaline, seroquel, and lorazepam. This normally helps her but it has not helped the past couple nights. She did not sleep well at all last night.  Pt has neurologist appt on Nov 1st Cb is [phone]

## 2018-05-20 MED ORDER — QUETIAPINE FUMARATE 50 MG PO TABS: 25.0000 mg | ORAL_TABLET | Freq: Two times a day (BID) | ORAL | 0 refills | Status: DC

## 2018-05-20 NOTE — Telephone Encounter (Signed)
Spoke with son and medication list updated.  Rx sent.  Son states he does have someone coming to the home to help with the patient.

## 2018-05-20 NOTE — Telephone Encounter (Signed)
Suggest that patient increase seroquel to twice daily and consider [facility]

## 2018-05-21 ENCOUNTER — Ambulatory Visit (INDEPENDENT_AMBULATORY_CARE_PROVIDER_SITE_OTHER): Payer: PPO | Admitting: *Deleted

## 2018-05-21 DIAGNOSIS — I480 Paroxysmal atrial fibrillation: Secondary | ICD-10-CM | POA: Diagnosis not present

## 2018-05-21 NOTE — Telephone Encounter (Signed)
Fyi.

## 2018-05-21 NOTE — Progress Notes (Signed)
Remote pacemaker transmission.   

## 2018-05-25 ENCOUNTER — Other Ambulatory Visit: Payer: Self-pay | Admitting: Internal Medicine

## 2018-05-25 NOTE — Telephone Encounter (Signed)
Okay for refill? Please advise 

## 2018-05-27 ENCOUNTER — Emergency Department (HOSPITAL_COMMUNITY): Payer: PPO

## 2018-05-27 ENCOUNTER — Ambulatory Visit: Payer: Self-pay | Admitting: *Deleted

## 2018-05-27 ENCOUNTER — Other Ambulatory Visit: Payer: Self-pay

## 2018-05-27 ENCOUNTER — Encounter (HOSPITAL_COMMUNITY): Payer: Self-pay | Admitting: Family Medicine

## 2018-05-27 ENCOUNTER — Observation Stay (HOSPITAL_COMMUNITY)
Admission: EM | Admit: 2018-05-27 | Discharge: 2018-05-28 | Disposition: A | Discharge status: Home / Self Care | Payer: PPO | Attending: Internal Medicine | Admitting: Internal Medicine

## 2018-05-27 DIAGNOSIS — G8929 Other chronic pain: Secondary | ICD-10-CM | POA: Diagnosis not present

## 2018-05-27 DIAGNOSIS — Z9849 Cataract extraction status, unspecified eye: Secondary | ICD-10-CM | POA: Insufficient documentation

## 2018-05-27 DIAGNOSIS — G92 Toxic encephalopathy: Principal | ICD-10-CM | POA: Insufficient documentation

## 2018-05-27 DIAGNOSIS — F419 Anxiety disorder, unspecified: Secondary | ICD-10-CM | POA: Insufficient documentation

## 2018-05-27 DIAGNOSIS — F4325 Adjustment disorder with mixed disturbance of emotions and conduct: Secondary | ICD-10-CM | POA: Insufficient documentation

## 2018-05-27 DIAGNOSIS — Z6824 Body mass index (BMI) 24.0-24.9, adult: Secondary | ICD-10-CM | POA: Insufficient documentation

## 2018-05-27 DIAGNOSIS — K219 Gastro-esophageal reflux disease without esophagitis: Secondary | ICD-10-CM | POA: Insufficient documentation

## 2018-05-27 DIAGNOSIS — F329 Major depressive disorder, single episode, unspecified: Secondary | ICD-10-CM | POA: Insufficient documentation

## 2018-05-27 DIAGNOSIS — R4189 Other symptoms and signs involving cognitive functions and awareness: Secondary | ICD-10-CM | POA: Diagnosis not present

## 2018-05-27 DIAGNOSIS — R531 Weakness: Secondary | ICD-10-CM | POA: Diagnosis not present

## 2018-05-27 DIAGNOSIS — R2681 Unsteadiness on feet: Secondary | ICD-10-CM | POA: Diagnosis not present

## 2018-05-27 DIAGNOSIS — Z880 Allergy status to penicillin: Secondary | ICD-10-CM | POA: Insufficient documentation

## 2018-05-27 DIAGNOSIS — H409 Unspecified glaucoma: Secondary | ICD-10-CM | POA: Diagnosis not present

## 2018-05-27 DIAGNOSIS — Z87442 Personal history of urinary calculi: Secondary | ICD-10-CM | POA: Insufficient documentation

## 2018-05-27 DIAGNOSIS — R634 Abnormal weight loss: Secondary | ICD-10-CM | POA: Diagnosis not present

## 2018-05-27 DIAGNOSIS — F05 Delirium due to known physiological condition: Secondary | ICD-10-CM

## 2018-05-27 DIAGNOSIS — E119 Type 2 diabetes mellitus without complications: Secondary | ICD-10-CM | POA: Diagnosis not present

## 2018-05-27 DIAGNOSIS — R0789 Other chest pain: Secondary | ICD-10-CM | POA: Diagnosis not present

## 2018-05-27 DIAGNOSIS — Z90710 Acquired absence of both cervix and uterus: Secondary | ICD-10-CM | POA: Insufficient documentation

## 2018-05-27 DIAGNOSIS — I700 Atherosclerosis of aorta: Secondary | ICD-10-CM | POA: Insufficient documentation

## 2018-05-27 DIAGNOSIS — E785 Hyperlipidemia, unspecified: Secondary | ICD-10-CM | POA: Diagnosis not present

## 2018-05-27 DIAGNOSIS — G4700 Insomnia, unspecified: Secondary | ICD-10-CM | POA: Diagnosis not present

## 2018-05-27 DIAGNOSIS — Z8379 Family history of other diseases of the digestive system: Secondary | ICD-10-CM | POA: Insufficient documentation

## 2018-05-27 DIAGNOSIS — T43225A Adverse effect of selective serotonin reuptake inhibitors, initial encounter: Secondary | ICD-10-CM | POA: Insufficient documentation

## 2018-05-27 DIAGNOSIS — Z79899 Other long term (current) drug therapy: Secondary | ICD-10-CM

## 2018-05-27 DIAGNOSIS — Z8249 Family history of ischemic heart disease and other diseases of the circulatory system: Secondary | ICD-10-CM | POA: Insufficient documentation

## 2018-05-27 DIAGNOSIS — R55 Syncope and collapse: Secondary | ICD-10-CM | POA: Diagnosis present

## 2018-05-27 DIAGNOSIS — R5383 Other fatigue: Secondary | ICD-10-CM | POA: Diagnosis not present

## 2018-05-27 DIAGNOSIS — I4891 Unspecified atrial fibrillation: Secondary | ICD-10-CM | POA: Insufficient documentation

## 2018-05-27 DIAGNOSIS — I119 Hypertensive heart disease without heart failure: Secondary | ICD-10-CM | POA: Diagnosis not present

## 2018-05-27 DIAGNOSIS — R4182 Altered mental status, unspecified: Secondary | ICD-10-CM | POA: Diagnosis not present

## 2018-05-27 DIAGNOSIS — Z808 Family history of malignant neoplasm of other organs or systems: Secondary | ICD-10-CM | POA: Insufficient documentation

## 2018-05-27 DIAGNOSIS — F0390 Unspecified dementia without behavioral disturbance: Secondary | ICD-10-CM | POA: Diagnosis not present

## 2018-05-27 DIAGNOSIS — M8580 Other specified disorders of bone density and structure, unspecified site: Secondary | ICD-10-CM | POA: Insufficient documentation

## 2018-05-27 DIAGNOSIS — D649 Anemia, unspecified: Secondary | ICD-10-CM | POA: Diagnosis not present

## 2018-05-27 DIAGNOSIS — Z800 Family history of malignant neoplasm of digestive organs: Secondary | ICD-10-CM | POA: Insufficient documentation

## 2018-05-27 DIAGNOSIS — E860 Dehydration: Secondary | ICD-10-CM | POA: Diagnosis not present

## 2018-05-27 DIAGNOSIS — Z950 Presence of cardiac pacemaker: Secondary | ICD-10-CM | POA: Insufficient documentation

## 2018-05-27 DIAGNOSIS — R404 Transient alteration of awareness: Secondary | ICD-10-CM | POA: Diagnosis not present

## 2018-05-27 LAB — CBC WITH DIFFERENTIAL/PLATELET
BASOS PCT: 0 %
Basophils Absolute: 0 10*3/uL (ref 0.0–0.1)
EOS ABS: 0 10*3/uL (ref 0.0–0.7)
Eosinophils Relative: 0 %
HCT: 41.7 % (ref 36.0–46.0)
HEMOGLOBIN: 13.2 g/dL (ref 12.0–15.0)
Lymphocytes Relative: 12 %
Lymphs Abs: 1.3 10*3/uL (ref 0.7–4.0)
MCH: 28.9 pg (ref 26.0–34.0)
MCHC: 31.7 g/dL (ref 30.0–36.0)
MCV: 91.4 fL (ref 78.0–100.0)
Monocytes Absolute: 0.6 10*3/uL (ref 0.1–1.0)
Monocytes Relative: 5 %
NEUTROS PCT: 83 %
Neutro Abs: 9.5 10*3/uL — ABNORMAL HIGH (ref 1.7–7.7)
Platelets: 254 10*3/uL (ref 150–400)
RBC: 4.56 MIL/uL (ref 3.87–5.11)
RDW: 13.5 % (ref 11.5–15.5)
WBC: 11.4 10*3/uL — AB (ref 4.0–10.5)

## 2018-05-27 LAB — COMPREHENSIVE METABOLIC PANEL
ALBUMIN: 4.3 g/dL (ref 3.5–5.0)
ALT: 10 U/L (ref 0–44)
AST: 15 U/L (ref 15–41)
Alkaline Phosphatase: 42 U/L (ref 38–126)
Anion gap: 13 (ref 5–15)
BILIRUBIN TOTAL: 0.8 mg/dL (ref 0.3–1.2)
BUN: 19 mg/dL (ref 8–23)
CHLORIDE: 104 mmol/L (ref 98–111)
CO2: 25 mmol/L (ref 22–32)
Calcium: 9.4 mg/dL (ref 8.9–10.3)
Creatinine, Ser: 1.06 mg/dL — ABNORMAL HIGH (ref 0.44–1.00)
GFR calc non Af Amer: 44 mL/min — ABNORMAL LOW (ref >60)
GFR, EST AFRICAN AMERICAN: 51 mL/min — AB (ref >60)
Glucose, Bld: 113 mg/dL — ABNORMAL HIGH (ref 70–99)
POTASSIUM: 4 mmol/L (ref 3.5–5.1)
SODIUM: 142 mmol/L (ref 135–145)
Total Protein: 8.2 g/dL — ABNORMAL HIGH (ref 6.5–8.1)

## 2018-05-27 LAB — URINALYSIS, ROUTINE W REFLEX MICROSCOPIC
BILIRUBIN URINE: NEGATIVE
Glucose, UA: NEGATIVE mg/dL
Hgb urine dipstick: NEGATIVE
Ketones, ur: NEGATIVE mg/dL
Leukocytes, UA: NEGATIVE
NITRITE: NEGATIVE
Protein, ur: NEGATIVE mg/dL
SPECIFIC GRAVITY, URINE: 1.013 (ref 1.005–1.030)
pH: 8 (ref 5.0–8.0)

## 2018-05-27 LAB — TROPONIN I
Troponin I: <0.03 ng/mL (ref <0.03)
Troponin I: <0.03 ng/mL (ref <0.03)

## 2018-05-27 LAB — I-STAT CG4 LACTIC ACID, ED
LACTIC ACID, VENOUS: 1.16 mmol/L (ref 0.5–1.9)
Lactic Acid, Venous: 1.98 mmol/L — ABNORMAL HIGH (ref 0.5–1.9)

## 2018-05-27 LAB — TSH: TSH: 6.346 u[IU]/mL — AB (ref 0.350–4.500)

## 2018-05-27 LAB — MAGNESIUM: Magnesium: 2.3 mg/dL (ref 1.7–2.4)

## 2018-05-27 LAB — PHOSPHORUS: Phosphorus: 3.4 mg/dL (ref 2.5–4.6)

## 2018-05-27 LAB — LIPASE, BLOOD: Lipase: 38 U/L (ref 11–51)

## 2018-05-27 LAB — AMMONIA: AMMONIA: 19 umol/L (ref 9–35)

## 2018-05-27 MED ORDER — LACTATED RINGERS IV BOLUS
500.0000 mL | Freq: Once | INTRAVENOUS | Status: AC
  Administered 2018-05-28: 500 mL via INTRAVENOUS

## 2018-05-27 MED ORDER — HALOPERIDOL LACTATE 5 MG/ML IJ SOLN
2.0000 mg | Freq: Once | INTRAMUSCULAR | Status: AC
  Administered 2018-05-27: 2 mg via INTRAVENOUS
  Filled 2018-05-27: qty 1

## 2018-05-27 MED ORDER — ENOXAPARIN SODIUM 30 MG/0.3ML ~~LOC~~ SOLN
30.0000 mg | Freq: Every day | SUBCUTANEOUS | Status: DC
  Administered 2018-05-28: 30 mg via SUBCUTANEOUS
  Filled 2018-05-27: qty 0.3

## 2018-05-27 MED ORDER — ACETAMINOPHEN 650 MG RE SUPP: 650.0000 mg | Freq: Four times a day (QID) | RECTAL | Status: DC | PRN

## 2018-05-27 MED ORDER — SERTRALINE HCL 50 MG PO TABS
50.0000 mg | ORAL_TABLET | Freq: Every day | ORAL | Status: DC
  Administered 2018-05-28: 50 mg via ORAL
  Filled 2018-05-27: qty 1

## 2018-05-27 MED ORDER — SIMVASTATIN 40 MG PO TABS: 40.0000 mg | ORAL_TABLET | Freq: Every day | ORAL | Status: DC

## 2018-05-27 MED ORDER — FAMOTIDINE 20 MG PO TABS
20.0000 mg | ORAL_TABLET | Freq: Two times a day (BID) | ORAL | Status: DC
  Administered 2018-05-28 (×2): 20 mg via ORAL
  Filled 2018-05-27 (×2): qty 1

## 2018-05-27 MED ORDER — ONDANSETRON HCL 4 MG PO TABS: 4.0000 mg | ORAL_TABLET | Freq: Four times a day (QID) | ORAL | Status: DC | PRN

## 2018-05-27 MED ORDER — ONDANSETRON HCL 4 MG/2ML IJ SOLN: 4.0000 mg | Freq: Four times a day (QID) | INTRAMUSCULAR | Status: DC | PRN

## 2018-05-27 MED ORDER — ACETAMINOPHEN 325 MG PO TABS: 650.0000 mg | ORAL_TABLET | Freq: Four times a day (QID) | ORAL | Status: DC | PRN

## 2018-05-27 NOTE — Telephone Encounter (Signed)
Pt's son stating that he was waiting for [employer] to come and relieve him when his mother went to lie down on the couch and became unresponsive. Pt's son states that the pt is breathing but is not opening her eyes and has been unresponsive for approximately 20-25 min. Pt's son advised to call 911 now. Understanding verbalized.   Reason for Disposition . Sounds like a life-threatening emergency to the triager  Answer Assessment - Initial Assessment Questions 1. ACUTE COMPLAINT: "What is the main problem?" "Tell me what happened?"     Pt's son calling stating that the pt went to lie down on the couch and is now unresonsive 2. AIRWAY: "Is he / she breathing?" (Yes, No, Unknown)     Pt's son states the pt is breathing 3. BREATHING: "Is there difficulty breathing?" (Yes, No, Unknown)     unknown 4. CIRCULATION: "Is there any bleeding?" (Yes, No, Unknown)     no 5. CONSCIOUS: "Is he/she awake, alert, and responding to you?" (Yes, No, Unknown)     Not responding or opening eyes  Protocols used: 911 [facility]

## 2018-05-27 NOTE — Telephone Encounter (Signed)
Will monitor for ED arrival.  

## 2018-05-27 NOTE — ED Provider Notes (Signed)
[facility]-EMERGENCY DEPT Provider Note   CSN: [id number] Arrival date & time: 05/27/18  1512     History   Chief Complaint Chief Complaint  Patient presents with  . Weakness  . Fatigue    HPI [name] is a 84 y.o. female.  HPI   84 year old female with past medical history as below here with altered mental status.  History provided by the patient's son.  Per report, the patient has been increasingly drowsy over the last 2 to 3 weeks.  She has been sleeping significantly more than usual.  Her appetite is decreased.  She is been complaining intermittently of lower abdominal pain.  Over the same time.,  She has been increasingly confused.  Earlier today, the patient was lying down.  The patient had called [employer] [facility] [employer] for help at home, and when they arrived, the patient was unable to be aroused.  The patient was unresponsive for approximately 10 minutes.  There is no generalized shaking activity.  She then came to and was very drowsy.  She has been increasingly awake and is not back to her baseline.  Son is adamant there is no generalized shaking activity.  Her eyes were not rolled back.  Denies any recent medication change.  Over-the-counter medication changes.  Currently, the patient denies any overt areas of pain, though history limited by dementia.  Level 5 caveat invoked as remainder of history, ROS, and physical exam limited due to patient's dementia, AMS.   Past Medical History:  Diagnosis Date  . ANEMIA, DEFICIENCY, HX OF 03/01/2010  . Anxiety   . DEPRESSIVE DISORDER 09/19/2010  . DM w/o Complication Type II 05/18/2007  . GERD 05/18/2007  . Glaucoma   . HYPERLIPIDEMIA 03/17/2008  . HYPERTENSION 05/18/2007  . INSOMNIA 11/23/2008  . Kidney stone   . Loss of weight 05/30/2010    Patient Active Problem List   Diagnosis Date Noted  . Syncope 05/27/2018  . 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 UTI  (urinary tract infection) 08/19/2017  . Dementia with behavioral disturbance 08/19/2017  . Anxiety 10/27/2012  . Symptomatic bradycardia 10/01/2011  . Status post cardiac pacemaker procedure 10/01/2011  . Atrial fibrillation (HCC) 09/29/2011  . Pain disorder 04/19/2011  . Chest pain, atypical 02/20/2011  . DEPRESSIVE DISORDER 09/19/2010  . LOSS OF WEIGHT 05/30/2010  . ANEMIA, DEFICIENCY, HX OF 03/01/2010  . INSOMNIA 11/23/2008  . Dyslipidemia 03/17/2008  . Diabetes mellitus without complication (HCC) 05/18/2007  . Essential hypertension 05/18/2007  . GERD 05/18/2007    Past Surgical History:  Procedure Laterality Date  . ABDOMINAL HYSTERECTOMY    . APPENDECTOMY    . CATARACT EXTRACTION    . COLONOSCOPY  06-28-10   per Dr. Arlyce Dice, diverticulosis only   . DILATION AND CURETTAGE OF UTERUS    . ESOPHAGOGASTRODUODENOSCOPY  05-13-11   per Dr. Arlyce Dice, normal   . HEMORRHOID SURGERY    . PACEMAKER INSERTION  09/2011  . PERMANENT PACEMAKER INSERTION N/A 09/30/2011   Procedure: PERMANENT PACEMAKER INSERTION;  Surgeon: Marinus Maw, MD;  Location: [facility] CATH LAB;  Service: Cardiovascular;  Laterality: N/A;     OB History    Gravida      Para      Term      Preterm      AB      Living  3     SAB      TAB  Ectopic      Multiple      Live Births               Home Medications    Prior to Admission medications   Medication Sig Start Date End Date Taking? Authorizing Provider  acetaminophen (TYLENOL) 500 MG tablet Take 1,000 mg by mouth every 6 (six) hours as needed for moderate pain (pain). For pain   Yes [provider]  famotidine (PEPCID) 20 MG tablet Take 1 tablet (20 mg total) by mouth 2 (two) times daily. 04/06/18  Yes Gordy Savers, MD  lisinopril (PRINIVIL,ZESTRIL) 10 MG tablet Take 1 tablet (10 mg total) by mouth daily. 06/17/17  Yes Gordy Savers, MD  LORazepam (ATIVAN) 0.5 MG tablet TAKE 1 TABLET 3 TIMES A DAY AS NEEDED. Patient  taking differently: Take 0.5 mg by mouth 3 (three) times daily as needed for anxiety.  05/14/18  Yes Gordy Savers, MD  QUEtiapine (SEROQUEL) 50 MG tablet Take 0.5 tablets (25 mg total) by mouth 2 (two) times daily. Patient taking differently: Take 50 mg by mouth 2 (two) times daily.  05/20/18  Yes Gordy Savers, MD  sertraline (ZOLOFT) 50 MG tablet TAKE 1 TABLET ONCE DAILY. 05/26/18  Yes Roderick Pee, MD  simvastatin (ZOCOR) 40 MG tablet Take 1 tablet (40 mg total) by mouth daily at 6 PM. 04/06/18  Yes Gordy Savers, MD  traMADol (ULTRAM) 50 MG tablet TAKE 1 TABLET EVERY 8 HOURS AS NEEDED FOR PAIN 05/26/18  Yes Roderick Pee, MD  Multiple Vitamin (MULTIVITAMIN WITH MINERALS) TABS tablet Take 1 tablet by mouth daily. Patient not taking: Reported on 05/27/2018 09/04/17   Gordy Savers, MD    Family History Family History  Problem Relation Age of Onset  . Unexplained death Mother   . Brain cancer Father        brain tumor  . Colon cancer Sister   . Colitis Brother   . Heart attack Brother     Social History Social History   Tobacco Use  . Smoking status: Never Smoker  . Smokeless tobacco: Never Used  Substance Use Topics  . Alcohol use: No  . Drug use: No     Allergies   Amoxicillin and Penicillins   Review of Systems Review of Systems  Unable to perform ROS: Dementia     Physical Exam Updated Vital Signs BP (!) 158/58 (BP Location: Left Arm)   Pulse 60   Temp 97.6 F (36.4 C) (Oral)   Resp 18   Ht 5\' 3"  (1.6 m) Comment: estimate  Wt 63.6 kg   SpO2 100%   BMI 24.84 kg/m   Physical Exam  Constitutional: She appears well-developed and well-nourished. No distress.  HENT:  Head: Normocephalic and atraumatic.  No apparent oral trauma or tongue biting  Eyes: Conjunctivae are normal.  Neck: Neck supple.  Cardiovascular: Normal rate, regular rhythm and normal heart sounds. Exam reveals no friction rub.  No murmur heard. Pulmonary/Chest:  Effort normal and breath sounds normal. No respiratory distress. She has no wheezes. She has no rales.  Abdominal: She exhibits no distension.  Minimal suprapubic and right upper quadrant tenderness.  Repair guarding.  No rashes or lesions.  Musculoskeletal: She exhibits no edema.  Neurological: She is alert. She exhibits normal muscle tone.  Oriented to person only.  Recognizes son.  Face symmetric.  Strength 5 out of 5 bilateral upper and lower extremities.  Normal sensation light touch.  Skin: Skin is warm. Capillary refill takes less than 2 seconds.  Psychiatric: She has a normal mood and affect.  Nursing note and vitals reviewed.    ED Treatments / Results  Labs (all labs ordered are listed, but only abnormal results are displayed) Labs Reviewed  CBC WITH DIFFERENTIAL/PLATELET - Abnormal; Notable for the following components:      Result Value   WBC 11.4 (*)    Neutro Abs 9.5 (*)    All other components within normal limits  COMPREHENSIVE METABOLIC PANEL - Abnormal; Notable for the following components:   Glucose, Bld 113 (*)    Creatinine, Ser 1.06 (*)    Total Protein 8.2 (*)    GFR calc non Af Amer 44 (*)    GFR calc Af Amer 51 (*)    All other components within normal limits  I-STAT CG4 LACTIC ACID, ED - Abnormal; Notable for the following components:   Lactic Acid, Venous 1.98 (*)    All other components within normal limits  CULTURE, BLOOD (ROUTINE X 2)  CULTURE, BLOOD (ROUTINE X 2)  URINALYSIS, ROUTINE W REFLEX MICROSCOPIC  TROPONIN I  LIPASE, BLOOD  MAGNESIUM  PHOSPHORUS  TSH  BASIC METABOLIC PANEL  CBC  VITAMIN B12  AMMONIA  TROPONIN I  I-STAT CG4 LACTIC ACID, ED    EKG EKG Interpretation  Date/Time:  Wednesday May 27 2018 16:19:47 EDT Ventricular Rate:  60 PR Interval:    QRS Duration: 154 QT Interval:  470 QTC Calculation: 470 R Axis:   -42 Text Interpretation:  Atrial-paced rhythm RBBB and LAFB Since last EKG, paced rhythm is now present  No other acute issues Confirmed by Shaune Pollack [phone]) on 05/27/2018 4:47:16 PM   Radiology Dg Chest 2 View  Result Date: 05/27/2018 CLINICAL DATA:  Weakness, altered mental status EXAM: CHEST - 2 VIEW COMPARISON:  11/22/2017 FINDINGS: Stable cardiomegaly without CHF or pneumonia. Lungs remain clear. No effusion or pneumothorax. Left subclavian 2 lead pacer noted. Trachea is midline. Aorta is atherosclerotic. Degenerative changes of the spine. IMPRESSION: Stable cardiomegaly without acute chest process. Electronically Signed   By: Judie Petit.  Shick M.D.   On: 05/27/2018 17:04   Ct Head Wo Contrast  Result Date: 05/27/2018 CLINICAL DATA:  Increased weakness and lethargy, history of hypertension, type II diabetes mellitus EXAM: CT HEAD WITHOUT CONTRAST TECHNIQUE: Contiguous axial images were obtained from the base of the skull through the vertex without intravenous contrast. Sagittal and coronal MPR images reconstructed from axial data set. COMPARISON:  11/22/2017 FINDINGS: Brain: Generalized atrophy. Normal ventricular morphology. No midline shift or mass effect. Small vessel chronic ischemic changes of deep cerebral white matter. No intracranial hemorrhage, mass lesion, evidence of acute infarction, or extra-axial fluid collection. Vascular: Atherosclerotic calcifications of internal carotid arteries bilaterally at skull base Skull: Intact calvaria.  Hyperostosis frontalis interna present. Sinuses/Orbits: Clear Other: N/A IMPRESSION: Atrophy with small vessel chronic ischemic changes of deep cerebral white matter. No acute intracranial abnormalities. Electronically Signed   By: Ulyses Southward M.D.   On: 05/27/2018 17:41    Procedures Procedures (including critical care time)  Medications Ordered in ED Medications  simvastatin (ZOCOR) tablet 40 mg (has no administration in time range)  sertraline (ZOLOFT) tablet 50 mg (has no administration in time range)  famotidine (PEPCID) tablet 20 mg (has no  administration in time range)  enoxaparin (LOVENOX) injection 30 mg (has no administration in time range)  lactated ringers bolus 500 mL (has no administration in time range)  acetaminophen (  TYLENOL) tablet 650 mg (has no administration in time range)    Or  acetaminophen (TYLENOL) suppository 650 mg (has no administration in time range)  ondansetron (ZOFRAN) tablet 4 mg (has no administration in time range)    Or  ondansetron (ZOFRAN) injection 4 mg (has no administration in time range)  haloperidol lactate (HALDOL) injection 2 mg (2 mg Intravenous Given 05/27/18 1858)     Initial Impression / Assessment and Plan / ED Course  I have reviewed the triage vital signs and the nursing notes.  Pertinent labs & imaging results that were available during my care of the patient were reviewed by me and considered in my medical decision making (see chart for details).     84 yo F here with generalized weakness, drowsiness, episode of unresponsiveness today. Lives with elderly son.  Lab work is overall reassuring with exception of mild lactate elevation.  CT head negative.  Chest x-ray without acute process.  EKG nonischemic.  Son is adamant this is never happened and the patient was completely unresponsive.  Concern for possible syncopal episode.  Does not sound like there is seizure-like activity.  Son does not feel comfortable managing at home.  Patient has dementia, but is normally able to get herself around the house.  Given new syncopal episode with high risk features in elderly patient, will plan to observe.  Final Clinical Impressions(s) / ED Diagnoses   Final diagnoses:  Unresponsiveness    ED Discharge Orders    None       Shaune Pollack, MD 05/27/18 2328

## 2018-05-27 NOTE — Telephone Encounter (Signed)
Pt has arrived at [facility] ED.  

## 2018-05-27 NOTE — ED Notes (Signed)
ED TO INPATIENT HANDOFF REPORT  Name/Age/Gender [name] 84 y.o. female  Code Status Code Status History    Date Active Date Inactive Code Status Order ID Comments User Context   11/22/2017 2226 11/23/2017 1552 Full Code [id number]  Deno Etienne, DO ED   08/19/2017 1633 08/21/2017 1943 DNR [id number]  Caren Griffins, MD ED   09/29/2011 0100 09/30/2011 1752 Full Code [id number]  Theressa Millard, MD ED      Home/SNF/Other Home  Chief Complaint weakness  Level of Care/Admitting Diagnosis ED Disposition    ED Disposition Condition [facility] Area: [facility] [[id number]]  Level of Care: Telemetry [5]  Admit to tele based on following criteria: Eval of Syncope  Diagnosis: Syncope [381017]  Admitting Physician: Bennie Pierini [[id number]]  Attending Physician: Jonnie Finner, [place] [[id number]]  PT Class (Do Not Modify): Observation [104]  PT Acc Code (Do Not Modify): Observation [10022]       Medical History Past Medical History:  Diagnosis Date  . ANEMIA, DEFICIENCY, HX OF 03/01/2010  . Anxiety   . DEPRESSIVE DISORDER 09/19/2010  . DM w/o Complication Type II 3/53/6144  . GERD 05/18/2007  . Glaucoma   . HYPERLIPIDEMIA 03/17/2008  . HYPERTENSION 05/18/2007  . INSOMNIA 11/23/2008  . Kidney stone   . Loss of weight 05/30/2010    Allergies Allergies  Allergen Reactions  . Amoxicillin Rash  . Penicillins Rash    Has patient had a PCN reaction causing immediate rash, facial/tongue/throat swelling, SOB or lightheadedness with hypotension: unknown Has patient had a PCN reaction causing severe rash involving mucus membranes or skin necrosis: unkonwn Has patient had a PCN reaction that required hospitalization unknown Has patient had a PCN reaction occurring within the last 10 years: No If all of the above ansers are "NO", then may proceed with Cephalosporin use.     IV Location/Drains/Wounds Patient Lines/Drains/Airways Status   Active  Line/Drains/Airways    Name:   Placement date:   Placement time:   Site:   Days:   Peripheral IV 05/27/18 Left Antecubital   05/27/18    1528    Antecubital   less than 1   Incision 09/30/11 Shoulder Left   09/30/11    -     2431          Labs/Imaging Results for orders placed or performed during the hospital encounter of 05/27/18 (from the past 48 hour(s))  CBC with Differential     Status: Abnormal   Collection Time: 05/27/18  4:22 PM  Result Value Ref Range   WBC 11.4 (H) 4.0 - 10.5 K/uL   RBC 4.56 3.87 - 5.11 MIL/uL   Hemoglobin 13.2 12.0 - 15.0 g/dL   HCT 41.7 36.0 - 46.0 %   MCV 91.4 78.0 - 100.0 fL   MCH 28.9 26.0 - 34.0 pg   MCHC 31.7 30.0 - 36.0 g/dL   RDW 13.5 11.5 - 15.5 %   Platelets 254 150 - 400 K/uL   Neutrophils Relative % 83 %   Neutro Abs 9.5 (H) 1.7 - 7.7 K/uL   Lymphocytes Relative 12 %   Lymphs Abs 1.3 0.7 - 4.0 K/uL   Monocytes Relative 5 %   Monocytes Absolute 0.6 0.1 - 1.0 K/uL   Eosinophils Relative 0 %   Eosinophils Absolute 0.0 0.0 - 0.7 K/uL   Basophils Relative 0 %   Basophils Absolute 0.0 0.0 - 0.1 K/uL    Comment: Performed  at [facility], [place] [address], [place], [place] [address]  Urinalysis, Routine w reflex microscopic     Status: None   Collection Time: 05/27/18  4:22 PM  Result Value Ref Range   Color, Urine YELLOW YELLOW   APPearance CLEAR CLEAR   Specific Gravity, Urine 1.013 1.005 - 1.030   pH 8.0 5.0 - 8.0   Glucose, UA NEGATIVE NEGATIVE mg/dL   Hgb urine dipstick NEGATIVE NEGATIVE   Bilirubin Urine NEGATIVE NEGATIVE   Ketones, ur NEGATIVE NEGATIVE mg/dL   Protein, ur NEGATIVE NEGATIVE mg/dL   Nitrite NEGATIVE NEGATIVE   Leukocytes, UA NEGATIVE NEGATIVE    Comment: Performed at [place] [address], [place], [place] [address]  Troponin I     Status: None   Collection Time: 05/27/18  4:22 PM  Result Value Ref Range   Troponin I <0.03 <0.03 ng/mL    Comment: Performed at [facility], [place] [address], [place], [place] [address]  I-Stat CG4 Lactic Acid, ED     Status: None   Collection Time: 05/27/18  4:30 PM  Result Value Ref Range   Lactic Acid, Venous 1.16 0.5 - 1.9 mmol/L  I-Stat CG4 Lactic Acid, ED     Status: Abnormal   Collection Time: 05/27/18  5:32 PM  Result Value Ref Range   Lactic Acid, Venous 1.98 (H) 0.5 - 1.9 mmol/L  Comprehensive metabolic panel     Status: Abnormal   Collection Time: 05/27/18  5:50 PM  Result Value Ref Range   Sodium 142 135 - 145 mmol/L   Potassium 4.0 3.5 - 5.1 mmol/L   Chloride 104 98 - 111 mmol/L   CO2 25 22 - 32 mmol/L   Glucose, Bld 113 (H) 70 - 99 mg/dL   BUN 19 8 - 23 mg/dL   Creatinine, Ser 1.06 (H) 0.44 - 1.00 mg/dL   Calcium 9.4 8.9 - 10.3 mg/dL   Total Protein 8.2 (H) 6.5 - 8.1 g/dL   Albumin 4.3 3.5 - 5.0 g/dL   AST 15 15 - 41 U/L   ALT 10 0 - 44 U/L   Alkaline Phosphatase 42 38 - 126 U/L   Total Bilirubin 0.8 0.3 - 1.2 mg/dL   GFR calc non Af Amer 44 (L) >60 mL/min   GFR calc Af Amer 51 (L) >60 mL/min    Comment: (NOTE) The eGFR has been calculated using the CKD EPI equation. This calculation has not been validated in all clinical situations. eGFR's persistently <60 mL/min signify possible Chronic Kidney Disease.    Anion gap 13 5 - 15    Comment: Performed at [facility], [place] [address], [place],  [address]  Lipase, blood     Status: None   Collection Time: 05/27/18  5:50 PM  Result Value Ref Range   Lipase 38 11 - 51 U/L    Comment: Performed at [facility], [place] [address] [place],  [address]   Dg Chest 2 View  Result Date: 05/27/2018 CLINICAL DATA:  Weakness, altered mental status EXAM: CHEST - 2 VIEW COMPARISON:  11/22/2017 FINDINGS: Stable cardiomegaly without CHF or pneumonia. Lungs remain clear. No effusion or pneumothorax. Left subclavian 2 lead pacer noted. Trachea is midline. Aorta is atherosclerotic. Degenerative  changes of the spine. IMPRESSION: Stable cardiomegaly without acute chest process. Electronically Signed   By: Jerilynn Mages.  Shick M.D.   On: 05/27/2018 17:04   Ct Head Wo Contrast  Result Date: 05/27/2018 CLINICAL DATA:  Increased weakness and lethargy, history of hypertension, type II diabetes mellitus EXAM: CT HEAD WITHOUT CONTRAST TECHNIQUE: Contiguous axial images were obtained from the base of the skull through the vertex without intravenous contrast. Sagittal and coronal MPR images reconstructed from axial data set. COMPARISON:  11/22/2017 FINDINGS: Brain: Generalized atrophy. Normal ventricular morphology. No midline shift or mass effect. Small vessel chronic ischemic changes of deep cerebral white matter. No intracranial hemorrhage, mass lesion, evidence of acute infarction, or extra-axial fluid collection. Vascular: Atherosclerotic calcifications of internal carotid arteries bilaterally at skull base Skull: Intact calvaria.  Hyperostosis frontalis interna present. Sinuses/Orbits: Clear Other: N/A IMPRESSION: Atrophy with small vessel chronic ischemic changes of deep cerebral white matter. No acute intracranial abnormalities. Electronically Signed   By: Lavonia Dana M.D.   On: 05/27/2018 17:41    Pending Labs [employer] (From admission, onward)    Start     Ordered   Signed and Held  Magnesium  Add-on,   R     Signed and Held   Signed and Held  Phosphorus  Add-on,   R     Signed and Held   Signed and Held  TSH  Add-on,   R     Signed and Held   Signed and Occupational hygienist morning,   R     Signed and Held   Signed and Held  CBC  Tomorrow morning,   R     Signed and Held   Signed and Held  Vitamin B12  Add-on,   R     Signed and Held   Signed and Held  Culture, blood (routine x 2)  BLOOD CULTURE X 2,   R     Signed and Held   Signed and Held  Ammonia  Once,   R     Signed and Held   Signed and Held  Troponin I  Once,   R     Signed and Held           Vitals/Pain Today's Vitals   05/27/18 1525 05/27/18 1630 05/27/18 1730 05/27/18 2029  BP: (!) 151/77 (!) 177/68 126/72 (!) 151/122  Pulse: 84 (!) 59 (!) 56 63  Resp: 15 19 (!) 22 18  Temp: 98.4 F (36.9 C)     TempSrc: Oral     SpO2: 99% 100% (!) 87% 99%    Isolation Precautions No active isolations  Medications Medications  haloperidol lactate (HALDOL) injection 2 mg (2 mg Intravenous Given 05/27/18 1858)    Mobility non-ambulatory

## 2018-05-27 NOTE — ED Triage Notes (Signed)
Patient is from home and transported via [facility]. Per EMS, patient has had increased weakness and lethargic more than usual. When arriving, EMS reports she was only nodding her head to answer questions. During transport, she was able to answer her name and location, which is normal for her. Also, patient's only pain complaint is in her right arm and abd.

## 2018-05-28 DIAGNOSIS — R4189 Other symptoms and signs involving cognitive functions and awareness: Secondary | ICD-10-CM | POA: Diagnosis not present

## 2018-05-28 LAB — BLOOD CULTURE ID PANEL (REFLEXED)
Acinetobacter baumannii: NOT DETECTED
CANDIDA ALBICANS: NOT DETECTED
CANDIDA PARAPSILOSIS: NOT DETECTED
Candida glabrata: NOT DETECTED
Candida krusei: NOT DETECTED
Candida tropicalis: NOT DETECTED
ENTEROBACTERIACEAE SPECIES: NOT DETECTED
ENTEROCOCCUS SPECIES: NOT DETECTED
Enterobacter cloacae complex: NOT DETECTED
Escherichia coli: NOT DETECTED
HAEMOPHILUS INFLUENZAE: NOT DETECTED
Klebsiella oxytoca: NOT DETECTED
Klebsiella pneumoniae: NOT DETECTED
LISTERIA MONOCYTOGENES: NOT DETECTED
METHICILLIN RESISTANCE: NOT DETECTED
NEISSERIA MENINGITIDIS: NOT DETECTED
PSEUDOMONAS AERUGINOSA: NOT DETECTED
Proteus species: NOT DETECTED
SERRATIA MARCESCENS: NOT DETECTED
STAPHYLOCOCCUS AUREUS BCID: NOT DETECTED
STREPTOCOCCUS AGALACTIAE: NOT DETECTED
STREPTOCOCCUS PNEUMONIAE: NOT DETECTED
STREPTOCOCCUS SPECIES: NOT DETECTED
Staphylococcus species: DETECTED — AB
Streptococcus pyogenes: NOT DETECTED

## 2018-05-28 LAB — BASIC METABOLIC PANEL
ANION GAP: 8 (ref 5–15)
BUN: 16 mg/dL (ref 8–23)
CALCIUM: 8.8 mg/dL — AB (ref 8.9–10.3)
CO2: 25 mmol/L (ref 22–32)
Chloride: 105 mmol/L (ref 98–111)
Creatinine, Ser: 0.95 mg/dL (ref 0.44–1.00)
GFR calc Af Amer: 58 mL/min — ABNORMAL LOW (ref >60)
GFR, EST NON AFRICAN AMERICAN: 50 mL/min — AB (ref >60)
GLUCOSE: 112 mg/dL — AB (ref 70–99)
Potassium: 4 mmol/L (ref 3.5–5.1)
Sodium: 138 mmol/L (ref 135–145)

## 2018-05-28 LAB — CBC
HCT: 38.2 % (ref 36.0–46.0)
Hemoglobin: 12.2 g/dL (ref 12.0–15.0)
MCH: 29.1 pg (ref 26.0–34.0)
MCHC: 31.9 g/dL (ref 30.0–36.0)
MCV: 91.2 fL (ref 78.0–100.0)
Platelets: 236 10*3/uL (ref 150–400)
RBC: 4.19 MIL/uL (ref 3.87–5.11)
RDW: 13.6 % (ref 11.5–15.5)
WBC: 11.2 10*3/uL — ABNORMAL HIGH (ref 4.0–10.5)

## 2018-05-28 LAB — GLUCOSE, CAPILLARY: Glucose-Capillary: 114 mg/dL — ABNORMAL HIGH (ref 70–99)

## 2018-05-28 LAB — VITAMIN B12: VITAMIN B 12: 430 pg/mL (ref 180–914)

## 2018-05-28 MED ORDER — LORAZEPAM 0.5 MG PO TABS: 0.2500 mg | ORAL_TABLET | Freq: Two times a day (BID) | ORAL | 0 refills | Status: DC | PRN

## 2018-05-28 MED ORDER — LISINOPRIL 10 MG PO TABS: 10.0000 mg | ORAL_TABLET | Freq: Every day | ORAL | Status: DC

## 2018-05-28 MED ORDER — QUETIAPINE FUMARATE 50 MG PO TABS: 25.0000 mg | ORAL_TABLET | Freq: Two times a day (BID) | ORAL | 0 refills | Status: DC

## 2018-05-28 NOTE — Evaluation (Signed)
Physical Therapy Evaluation Patient Details Name: [name] MRN: [id number] DOB: 31-May-1923 Today's Date: 05/28/2018   History of Present Illness  84 yo female admitted with syncope. Hx of dementia, DM, pacemaker, depression, anxiety  Clinical Impression  On eval, pt required Min assist for mobility. She walked ~25 feet with a RW. Pt is unsteady and at risk for falls when mobilizing. Improved stability with use of RW. Limited session due to pt's c/o back pain and not feeling well. No family present during session. Recommend SNF unless family prefers to take pt back home.     Follow Up Recommendations SNF;Supervision/Assistance - 24 hour(unless family decides to take pt home)    Equipment Recommendations  Rolling walker with 5" wheels(if pt doesn't already have one)    Recommendations for Other Services       Precautions / Restrictions Precautions Precautions: Fall Restrictions Weight Bearing Restrictions: No      Mobility  Bed Mobility Overal bed mobility: Needs Assistance Bed Mobility: Supine to Sit     Supine to sit: Supervision;HOB elevated     General bed mobility comments: for safety. increased time.   Transfers Overall transfer level: Needs assistance Equipment used: 1 person hand held assist Transfers: Sit to/from Stand Sit to Stand: Min assist         General transfer comment: Assist to rise, stabilize, control descent. VCs safety, hand placement. Unsteady.   Ambulation/Gait   Gait Distance (Feet): 25 Feet(25'x1, 12'x1) Assistive device: 1 person hand held assist;Rolling walker (2 wheeled) Gait Pattern/deviations: Step-through pattern;Decreased stride length;Trunk flexed     General Gait Details: Walked x 1 with 1 HHA to bathroom-very unsteady. Walked x 1 with RW-Min guard assist. Slow gait speed. Pt declined to walk beyond a short distance due to back pain and "not feeling well."   Stairs            Wheelchair Mobility    Modified  Rankin (Stroke Patients Only)       Balance Overall balance assessment: Needs assistance;History of Falls         Standing balance support: Bilateral upper extremity supported Standing balance-Leahy Scale: Poor                               Pertinent Vitals/Pain Pain Assessment: Faces Faces Pain Scale: Hurts little more Pain Location: back Pain Descriptors / Indicators: Aching;Discomfort Pain Intervention(s): Limited activity within patient's tolerance;Repositioned    Home Living Family/patient expects to be discharged to:: Unsure Living Arrangements: Children   Type of Home: House           Additional Comments: per chart, son is pt's caregiver    Prior Function           Comments: unknown, pt unable to state     Hand Dominance        Extremity/Trunk Assessment   Upper Extremity Assessment Upper Extremity Assessment: Generalized weakness    Lower Extremity Assessment Lower Extremity Assessment: Generalized weakness    Cervical / Trunk Assessment Cervical / Trunk Assessment: Kyphotic  Communication   Communication: No difficulties  Cognition Arousal/Alertness: Awake/alert Behavior During Therapy: WFL for tasks assessed/performed Overall Cognitive Status: History of cognitive impairments - at baseline  General Comments      Exercises     Assessment/Plan    PT Assessment Patient needs continued PT services  PT Problem List Decreased strength;Decreased balance;Decreased mobility;Decreased cognition;Decreased safety awareness;Decreased activity tolerance       PT Treatment Interventions Gait training;DME instruction;Functional mobility training;Therapeutic activities;Balance training;Patient/family education;Therapeutic exercise    PT Goals (Current goals can be found in the Care Plan section)  Acute Rehab PT Goals Patient Stated Goal: none stated. pt unable.  PT Goal  Formulation: Patient unable to participate in goal setting Time For Goal Achievement: 06/11/18 Potential to Achieve Goals: Good    Frequency Min 3X/week   Barriers to discharge        Co-evaluation               AM-PAC PT "6 Clicks" Daily Activity  Outcome Measure Difficulty turning over in bed (including adjusting bedclothes, sheets and blankets)?: A Little Difficulty moving from lying on back to sitting on the side of the bed? : A Little Difficulty sitting down on and standing up from a chair with arms (e.g., wheelchair, bedside commode, etc,.)?: Unable Help needed moving to and from a bed to chair (including a wheelchair)?: A Little Help needed walking in hospital room?: A Little Help needed climbing 3-5 steps with a railing? : A Lot 6 Click Score: 15    End of Session Equipment Utilized During Treatment: Gait belt Activity Tolerance: Patient limited by pain;Patient limited by fatigue Patient left: in chair;with call bell/phone within reach;with chair alarm set   PT Visit Diagnosis: Muscle weakness (generalized) (M62.81);Difficulty in walking, not elsewhere classified (R26.2)    Time: 4098-1191 PT Time Calculation (min) (ACUTE ONLY): 14 min   Charges:   PT Evaluation $PT Eval Moderate Complexity: 1 Mod            Rebeca Alert, PT Acute Rehabilitation Services Pager: [phone] Office: [phone]

## 2018-05-28 NOTE — H&P (Signed)
History and Physical    [name] UJW:[id number] DOB: 10/09/1922 DOA: 05/27/2018  PCP: Gordy Savers, MD  Patient coming from: Home  I have personally briefly reviewed patient's old medical records in [facility]  Chief Complaint: Unresponsiveness  HPI: [name] is a 84 y.o. female with medical history significant for unspecified dementia, hypertension, DM2 and depression/anxiety who presents to the emergency department from home where she resides with her son after she was found unresponsive on the couch at home.  Her son states that her mental status is quite poor at baseline and that she is nearly completely dependent in all of her ADLs and IADLs.  She awoke in the morning, ate breakfast, and went to the couch for some period of time.  When he checked on her next in the early afternoon, he was unable to arouse her.  He states that recently, she has been very sleepy and takes many frequent naps during the day.  He reports no new medications or recent medication changes.  Current medications include lorazepam, quetiapine, tramadol.  He provides her all of her medications and reports that she did not inadvertently take more than her prescribed amount.  No fever, chills, night sweats, chest pain, palpitations, urinary changes.  Patient has chronic abdominal pain and pain under her left breast that are unchanged today.  No sick contacts at home.  By the time of EMS arrival at their home, the patient was more responsive and able to state her name and location.  ED Course: In the ED, patient afebrile, hemodynamically stable.  Labs notable for WBC 11.4, Hgb 13.2, platelets 254, normal electrolytes, BUN 19, creatinine 1.06 which is near her prior baseline, normal LFTs.  Urinalysis showed negative leukocytes and nitrites.  CT head showed no acute intracranial abnormality and a background of small vessel chronic ischemic changes.  Chest x-ray showed a biventricular ICD in no acute  cardiopulmonary disease with stable cardiomegaly.  EKG showed an atrial paced rhythm at 60 bpm.  In the ED, patient received 2 mg Haldol and a 500 cc LR bolus.  Patient stable for admission to the floor.  Review of Systems: As per HPI otherwise 10 point review of systems negative.   Past Medical History:  Diagnosis Date  . ANEMIA, DEFICIENCY, HX OF 03/01/2010  . Anxiety   . DEPRESSIVE DISORDER 09/19/2010  . DM w/o Complication Type II 05/18/2007  . GERD 05/18/2007  . Glaucoma   . HYPERLIPIDEMIA 03/17/2008  . HYPERTENSION 05/18/2007  . INSOMNIA 11/23/2008  . Kidney stone   . Loss of weight 05/30/2010    Past Surgical History:  Procedure Laterality Date  . ABDOMINAL HYSTERECTOMY    . APPENDECTOMY    . CATARACT EXTRACTION    . COLONOSCOPY  06-28-10   per Dr. Arlyce Dice, diverticulosis only   . DILATION AND CURETTAGE OF UTERUS    . ESOPHAGOGASTRODUODENOSCOPY  05-13-11   per Dr. Arlyce Dice, normal   . HEMORRHOID SURGERY    . PACEMAKER INSERTION  09/2011  . PERMANENT PACEMAKER INSERTION N/A 09/30/2011   Procedure: PERMANENT PACEMAKER INSERTION;  Surgeon: Marinus Maw, MD;  Location: [facility] C[facility] LAB;  Service: Cardiovascular;  Laterality: N/A;     reports that she has never smoked. She has never used smokeless tobacco. She reports that she does not drink alcohol or use drugs.  Allergies  Allergen Reactions  . Amoxicillin Rash  . Penicillins Rash    Has patient had a PCN reaction causing immediate  rash, facial/tongue/throat swelling, SOB or lightheadedness with hypotension: unknown Has patient had a PCN reaction causing severe rash involving mucus membranes or skin necrosis: unkonwn Has patient had a PCN reaction that required hospitalization unknown Has patient had a PCN reaction occurring within the last 10 years: No If all of the above ansers are "NO", then may proceed with Cephalosporin use.     Family History  Problem Relation Age of Onset  . Unexplained death Mother   . Brain cancer  Father        brain tumor  . Colon cancer Sister   . Colitis Brother   . Heart attack Brother     Prior to Admission medications   Medication Sig Start Date End Date Taking? Authorizing Provider  acetaminophen (TYLENOL) 500 MG tablet Take 1,000 mg by mouth every 6 (six) hours as needed for moderate pain (pain). For pain   Yes [provider]  famotidine (PEPCID) 20 MG tablet Take 1 tablet (20 mg total) by mouth 2 (two) times daily. 04/06/18  Yes Gordy Savers, MD  lisinopril (PRINIVIL,ZESTRIL) 10 MG tablet Take 1 tablet (10 mg total) by mouth daily. 06/17/17  Yes Gordy Savers, MD  LORazepam (ATIVAN) 0.5 MG tablet TAKE 1 TABLET 3 TIMES A DAY AS NEEDED. Patient taking differently: Take 0.5 mg by mouth 3 (three) times daily as needed for anxiety.  05/14/18  Yes Gordy Savers, MD  QUEtiapine (SEROQUEL) 50 MG tablet Take 0.5 tablets (25 mg total) by mouth 2 (two) times daily. Patient taking differently: Take 50 mg by mouth 2 (two) times daily.  05/20/18  Yes Gordy Savers, MD  sertraline (ZOLOFT) 50 MG tablet TAKE 1 TABLET ONCE DAILY. 05/26/18  Yes Roderick Pee, MD  simvastatin (ZOCOR) 40 MG tablet Take 1 tablet (40 mg total) by mouth daily at 6 PM. 04/06/18  Yes Gordy Savers, MD  traMADol (ULTRAM) 50 MG tablet TAKE 1 TABLET EVERY 8 HOURS AS NEEDED FOR PAIN 05/26/18  Yes Roderick Pee, MD  Multiple Vitamin (MULTIVITAMIN WITH MINERALS) TABS tablet Take 1 tablet by mouth daily. Patient not taking: Reported on 05/27/2018 09/04/17   Gordy Savers, MD    Physical Exam: Vitals:   05/27/18 1630 05/27/18 1730 05/27/18 2029 05/27/18 2205  BP: (!) 177/68 126/72 (!) 151/122 (!) 158/58  Pulse: (!) 59 (!) 56 63 60  Resp: 19 (!) 22 18 18   Temp:    97.6 F (36.4 C)  TempSrc:    Oral  SpO2: 100% (!) 87% 99% 100%  Weight:    63.6 kg  Height:    5\' 3"  (1.6 m)    Constitutional: NAD, calm, comfortable Eyes: PERRL, lids and conjunctivae normal ENMT: Mucous  membranes are moist. Posterior pharynx clear of any exudate or lesions. Neck: normal, supple, no masses, no thyromegaly Respiratory: clear to auscultation bilaterally, no wheezing, no crackles. Normal respiratory effort. Cardiovascular: Regular rate and rhythm, no murmurs / rubs / gallops. No extremity edema. 2+ pedal pulses. Abdomen: no tenderness, no masses palpated.Bowel sounds positive.  Musculoskeletal: no clubbing / cyanosis. No joint deformity upper and lower extremities. Good ROM, no contractures. Diminished muscle tone.  Skin: no rashes, lesions, ulcers. No induration Neurologic: CN 2-12 grossly intact. Strength 5/5 in all 4.  Psychiatric: Normal judgment and insight. Alert and oriented x 3. Normal mood.   Labs on Admission: I have personally reviewed following labs and imaging studies  CBC: Recent Labs  Lab 05/27/18 1622  WBC  11.4*  NEUTROABS 9.5*  HGB 13.2  HCT 41.7  MCV 91.4  PLT 254   Basic Metabolic Panel: Recent Labs  Lab 05/27/18 1750 05/27/18 2238  NA 142  --   K 4.0  --   CL 104  --   CO2 25  --   GLUCOSE 113*  --   BUN 19  --   CREATININE 1.06*  --   CALCIUM 9.4  --   MG  --  2.3  PHOS  --  3.4   GFR: Estimated Creatinine Clearance: 29.8 mL/min (A) (by C-G formula based on SCr of 1.06 mg/dL (H)). Liver Function Tests: Recent Labs  Lab 05/27/18 1750  AST 15  ALT 10  ALKPHOS 42  BILITOT 0.8  PROT 8.2*  ALBUMIN 4.3   Recent Labs  Lab 05/27/18 1750  LIPASE 38   Recent Labs  Lab 05/27/18 2238  AMMONIA 19   Coagulation Profile: No results for input(s): INR, PROTIME in the last 168 hours. Cardiac Enzymes: Recent Labs  Lab 05/27/18 1622 05/27/18 2238  TROPONINI <0.03 <0.03   BNP (last 3 results) No results for input(s): PROBNP in the last 8760 hours. HbA1C: No results for input(s): HGBA1C in the last 72 hours. CBG: No results for input(s): GLUCAP in the last 168 hours. Lipid Profile: No results for input(s): CHOL, HDL, LDLCALC,  TRIG, CHOLHDL, LDLDIRECT in the last 72 hours. Thyroid Function Tests: Recent Labs    05/27/18 2238  TSH 6.346*   Anemia Panel: Recent Labs    05/27/18 2238  VITAMINB12 430   Urine analysis:    Component Value Date/Time   COLORURINE YELLOW 05/27/2018 1622   APPEARANCEUR CLEAR 05/27/2018 1622   LABSPEC 1.013 05/27/2018 1622   PHURINE 8.0 05/27/2018 1622   GLUCOSEU NEGATIVE 05/27/2018 1622   HGBUR NEGATIVE 05/27/2018 1622   HGBUR negative 04/19/2010 1422   BILIRUBINUR NEGATIVE 05/27/2018 1622   BILIRUBINUR 1+ 06/24/2012 1447   KETONESUR NEGATIVE 05/27/2018 1622   PROTEINUR NEGATIVE 05/27/2018 1622   UROBILINOGEN 0.2 10/18/2014 2027   NITRITE NEGATIVE 05/27/2018 1622   LEUKOCYTESUR NEGATIVE 05/27/2018 1622    Radiological Exams on Admission: Dg Chest 2 View  Result Date: 05/27/2018 CLINICAL DATA:  Weakness, altered mental status EXAM: CHEST - 2 VIEW COMPARISON:  11/22/2017 FINDINGS: Stable cardiomegaly without CHF or pneumonia. Lungs remain clear. No effusion or pneumothorax. Left subclavian 2 lead pacer noted. Trachea is midline. Aorta is atherosclerotic. Degenerative changes of the spine. IMPRESSION: Stable cardiomegaly without acute chest process. Electronically Signed   By: Judie Petit.  Shick M.D.   On: 05/27/2018 17:04   Ct Head Wo Contrast  Result Date: 05/27/2018 CLINICAL DATA:  Increased weakness and lethargy, history of hypertension, type II diabetes mellitus EXAM: CT HEAD WITHOUT CONTRAST TECHNIQUE: Contiguous axial images were obtained from the base of the skull through the vertex without intravenous contrast. Sagittal and coronal MPR images reconstructed from axial data set. COMPARISON:  11/22/2017 FINDINGS: Brain: Generalized atrophy. Normal ventricular morphology. No midline shift or mass effect. Small vessel chronic ischemic changes of deep cerebral white matter. No intracranial hemorrhage, mass lesion, evidence of acute infarction, or extra-axial fluid collection. Vascular:  Atherosclerotic calcifications of internal carotid arteries bilaterally at skull base Skull: Intact calvaria.  Hyperostosis frontalis interna present. Sinuses/Orbits: Clear Other: N/A IMPRESSION: Atrophy with small vessel chronic ischemic changes of deep cerebral white matter. No acute intracranial abnormalities. Electronically Signed   By: Ulyses Southward M.D.   On: 05/27/2018 17:41    EKG:  Independently reviewed. Atrial paced rhythm at 60 bpm.  Assessment/Plan Active Problems:   Syncope  [name] is a 84 y.o. female with above medical history admitted for an episode of unresponsiveness representing possible syncope (orthostatic or vasovagal most likely, but probably medication effect. The timing likely fits with benzodiazepine exposure as she takes lorazepam TID. She additionally takes tramadol for chronic pain and quetiapine for sundowning. No seizure activity, bowel or bladder incontinence. No report of ICD firing. Would additionally consider silent MI, thyroid disease or metabolic disturbance.  Hypersomnolence Unspecified dementia - Hold sedating meds; would specifically safely discontinue scheduled lorazepam unless necessary for agitation - Continue home Zoloft - Check B12, TSH, ammonia, blood/urine cultures - No indication for antibiotic therapy at this time - Delirium precautions - PT eval - Watch for signs/sxs of benzo withdrawal  Chronic medical conditions: -HTN: continue home lisinopril -HLD: continue home Zocor -GERD: continue home PPI  DVT prophylaxis: Lovenox Code Status: DNR/DNI Family Communication: Son, [name] Disposition Plan: Home v SNF in 1-2 days Consults called: None Admission status: Obs, telemetry   Adam Laney Pastor MD [facility]  If 7PM-7AM, please contact night-coverage [email or web address] Password [facility]  05/28/2018, 12:51 AM

## 2018-05-29 ENCOUNTER — Encounter: Payer: Self-pay | Admitting: Internal Medicine

## 2018-05-29 ENCOUNTER — Telehealth: Payer: Self-pay | Admitting: Internal Medicine

## 2018-05-29 NOTE — Telephone Encounter (Signed)
Transition Care Management Follow-up Telephone Call   Date discharged? 05/28/18  How have you been since you were released from the hospital? "She's feeling a little better.  She was sleeping a lot but today she has been less irritable and has stayed awake for the most part."  The son thinks she is better since he started giving her 1/2 tablet of the lorazepam instead of a whole one.    Do you understand why you were in the hospital? No - the new normal is her not knowing exactly what is going on.  The son understands why she was in the hospital.   Do you understand the discharge instructions? Yes    Where were you discharged to? Home   Items Reviewed:  Medications reviewed: yes   Allergies reviewed: yes  Dietary changes reviewed: yes  Referrals reviewed: no   Functional Questionnaire:   Activities of Daily Living (ADLs):   She states they are independent in the following: ambulation, bathing and hygiene, feeding, continence, grooming, toileting and dressing States they require assistance with the following: Does everything on her own - her son is there if she needs any assistance.    Any transportation issues/concerns?: yes, her and her son don't drive anymore - he will have to find transportation to get her to appts.   Any patient concerns? no   Confirmed importance and date/time of follow-up visits scheduled: Son will have to look at his schedule - he will call us back next week to set up an appt.     Confirmed with patient if condition begins to worsen call PCP or go to the ER.  Patient was given the office number and encouraged to call back with question or concerns.  : yes

## 2018-05-29 NOTE — Telephone Encounter (Signed)
Error

## 2018-05-29 NOTE — Discharge Summary (Signed)
Physician Discharge Summary  [name]:[id number] DOB: June 01, 1923 DOA: 05/27/2018  PCP: Gordy Savers, MD  Admit date: 05/27/2018 Discharge date: 05/28/2018  Admitted From: Home.  Disposition: Home   Recommendations for Outpatient Follow-up:  1. Follow up with PCP in 1-2 weeks 2. Please obtain BMP/CBC in one week   Home Health: YES.   Discharge Condition:GUARDED.  CODE STATUS: DNR Diet recommendation: Heart Healthy   Brief/Interim Summary:   [name] is a 84 y.o. female with medical history significant for unspecified dementia, hypertension, DM2 and depression/anxiety who presents to the emergency department from home where she resides with her son after she was found unresponsive on the couch at home. Her son states that her mental status is quite poor at baseline and that she is nearly completely dependent in all of her ADLs and IADLs.  She awoke in the morning, ate breakfast, and went to the couch for some period of time.  When he checked on her next in the early afternoon, he was unable to arouse her.  He states that recently, she has been very sleepy and takes many frequent naps during the day.   Son reports that zoloft was recently increased from 25 mg BID to 50 mg BID right before her PCP retired and it appears since then she has been more sleepy.   We have initially held zoloft for 24 hours and restarted at the previous dose on discharge. We have also decreased the lorazepam to 0.25 BID prn instead of 0.5 TID prn. Discussed with the son regarding the changes in meds on discharge.  Discharge Diagnoses:  Active Problems: Acute encephalopathy  Hypertension Hyperlipidemia GERD.  DEMENTIA.     Acute encephalopathy/ unresponsiveness : Secondary to medications  And dehydration/ poor po intake.  Son reports that zoloft was recently increased from 25 mg BID to 50 mg BID right before her PCP retired and it appears since then she has been more sleepy.    We have initially held zoloft for 24 hours and restarted at the previous dose on discharge. We have also decreased the lorazepam to 0.25 BID prn instead of 0.5 TID prn. Discussed with the son regarding the changes in meds on discharge.   Vit b12 is 430.  tsh slightly elevated, recommend checking in 4 weeks.  Blood cultures shows one bottle positive for coag neg staph, possibly a contaminant.  No source of infection found so far.  CT head negative for acute pathology.  After IV fluids for 12 hours, pt is awake, had her breakfast, able to answer questions appropriately , and worked with PT, and the son reports she appears to be back to baseline.  SON and the patient does not want SNF and wants to take the patient home with home health.  Recommend follow up with PCP in one week.    Hypertension;  Well controlled.    Dementia; appears at baseline.    GERD; Resume SSI.      Discharge Instructions  Discharge Instructions    Diet - low sodium heart healthy   Complete by:  As directed    Increase activity slowly   Complete by:  As directed      Allergies as of 05/28/2018      Reactions   Amoxicillin Rash   Penicillins Rash   Has patient had a PCN reaction causing immediate rash, facial/tongue/throat swelling, SOB or lightheadedness with hypotension: unknown Has patient had a PCN reaction causing severe rash involving mucus  membranes or skin necrosis: unkonwn Has patient had a PCN reaction that required hospitalization unknown Has patient had a PCN reaction occurring within the last 10 years: No If all of the above ansers are "NO", then may proceed with Cephalosporin use.      Medication List    TAKE these medications   acetaminophen 500 MG tablet Commonly known as:  TYLENOL Take 1,000 mg by mouth every 6 (six) hours as needed for moderate pain (pain). For pain   famotidine 20 MG tablet Commonly known as:  PEPCID Take 1 tablet (20 mg total) by mouth 2 (two) times  daily.   lisinopril 10 MG tablet Commonly known as:  PRINIVIL,ZESTRIL Take 1 tablet (10 mg total) by mouth daily.   LORazepam 0.5 MG tablet Commonly known as:  ATIVAN Take 0.5 tablets (0.25 mg total) by mouth every 12 (twelve) hours as needed for anxiety. What changed:  See the new instructions.   multivitamin with minerals Tabs tablet Take 1 tablet by mouth daily.   QUEtiapine 50 MG tablet Commonly known as:  SEROQUEL Take 0.5 tablets (25 mg total) by mouth 2 (two) times daily. What changed:  how much to take   sertraline 50 MG tablet Commonly known as:  ZOLOFT TAKE 1 TABLET ONCE DAILY.   simvastatin 40 MG tablet Commonly known as:  ZOCOR Take 1 tablet (40 mg total) by mouth daily at 6 PM.   traMADol 50 MG tablet Commonly known as:  ULTRAM TAKE 1 TABLET EVERY 8 HOURS AS NEEDED FOR PAIN      Follow-up Information    Gordy Savers, MD. Schedule an appointment as soon as possible for a visit in 1 week(s).   Specialty:  Internal Medicine Contact information: [address] [place] [address] [phone]          Allergies  Allergen Reactions  . Amoxicillin Rash  . Penicillins Rash    Has patient had a PCN reaction causing immediate rash, facial/tongue/throat swelling, SOB or lightheadedness with hypotension: unknown Has patient had a PCN reaction causing severe rash involving mucus membranes or skin necrosis: unkonwn Has patient had a PCN reaction that required hospitalization unknown Has patient had a PCN reaction occurring within the last 10 years: No If all of the above ansers are "NO", then may proceed with Cephalosporin use.     Consultations:  NONE.    Procedures/Studies: Dg Chest 2 View  Result Date: 05/27/2018 CLINICAL DATA:  Weakness, altered mental status EXAM: CHEST - 2 VIEW COMPARISON:  11/22/2017 FINDINGS: Stable cardiomegaly without CHF or pneumonia. Lungs remain clear. No effusion or pneumothorax. Left subclavian 2 lead  pacer noted. Trachea is midline. Aorta is atherosclerotic. Degenerative changes of the spine. IMPRESSION: Stable cardiomegaly without acute chest process. Electronically Signed   By: Judie Petit.  Shick M.D.   On: 05/27/2018 17:04   Ct Head Wo Contrast  Result Date: 05/27/2018 CLINICAL DATA:  Increased weakness and lethargy, history of hypertension, type II diabetes mellitus EXAM: CT HEAD WITHOUT CONTRAST TECHNIQUE: Contiguous axial images were obtained from the base of the skull through the vertex without intravenous contrast. Sagittal and coronal MPR images reconstructed from axial data set. COMPARISON:  11/22/2017 FINDINGS: Brain: Generalized atrophy. Normal ventricular morphology. No midline shift or mass effect. Small vessel chronic ischemic changes of deep cerebral white matter. No intracranial hemorrhage, mass lesion, evidence of acute infarction, or extra-axial fluid collection. Vascular: Atherosclerotic calcifications of internal carotid arteries bilaterally at skull base Skull: Intact calvaria.  Hyperostosis  frontalis interna present. Sinuses/Orbits: Clear Other: N/A IMPRESSION: Atrophy with small vessel chronic ischemic changes of deep cerebral white matter. No acute intracranial abnormalities. Electronically Signed   By: Ulyses Southward M.D.   On: 05/27/2018 17:41     Subjective: No chest pain , sob, nausea, or vomiting or abdominal pain.  Refusing SNF. Son at bedside, wants to take the patient home with home health.   Discharge Exam: Vitals:   05/27/18 2205 05/28/18 0653  BP: (!) 158/58 (!) 162/66  Pulse: 60 65  Resp: 18 18  Temp: 97.6 F (36.4 C) 98.9 F (37.2 C)  SpO2: 100% 94%   Vitals:   05/27/18 1730 05/27/18 2029 05/27/18 2205 05/28/18 0653  BP: 126/72 (!) 151/122 (!) 158/58 (!) 162/66  Pulse: (!) 56 63 60 65  Resp: (!) 22 18 18 18   Temp:   97.6 F (36.4 C) 98.9 F (37.2 C)  TempSrc:   Oral Oral  SpO2: (!) 87% 99% 100% 94%  Weight:   63.6 kg 62.1 kg  Height:   5\' 3"  (1.6 m)      General: Pt is alert, awake, not in acute distress Cardiovascular: RRR, S1/S2 +, no rubs, no gallops Respiratory: CTA bilaterally, no wheezing, no rhonchi Abdominal: Soft, NT, ND, bowel sounds + Extremities: no edema, no cyanosis    The results of significant diagnostics from this hospitalization (including imaging, microbiology, ancillary and laboratory) are listed below for reference.     Microbiology: Recent Results (from the past 240 hour(s))  Culture, blood (routine x 2)     Status: None (Preliminary result)   Collection Time: 05/27/18 10:38 PM  Result Value Ref Range Status   Specimen Description   Final    BLOOD RIGHT ANTECUBITAL Performed at [facility], [address] [place], [place] [address]    Special Requests   Final    BOTTLES DRAWN AEROBIC ONLY Blood Culture adequate volume Performed at [facility], [address], [place], [place] [address]    Culture  Setup Time   Final    AEROBIC BOTTLE ONLY GRAM POSITIVE COCCI Organism ID to follow PATIENT DISCHARGED OR EXPIRED    Culture   Final    NO GROWTH 1 DAY Performed at [facility], [address], [place], [place] [address]    Report Status PENDING  Incomplete  Culture, blood (routine x 2)     Status: None (Preliminary result)   Collection Time: 05/27/18 10:38 PM  Result Value Ref Range Status   Specimen Description   Final    BLOOD RIGHT HAND Performed at [facility], [address], [place], [place] [address]    Special Requests   Final    BOTTLES DRAWN AEROBIC ONLY Blood Culture adequate volume Performed at [facility], [address] [place], [place] [address]    Culture   Final    NO GROWTH 1 DAY Performed at [facility], [address], [place], [place] [address]    Report Status PENDING  Incomplete  Blood Culture ID Panel (Reflexed)     Status: Abnormal   Collection Time: 05/27/18 10:38 PM   Result Value Ref Range Status   Enterococcus species NOT DETECTED NOT DETECTED Final   Listeria monocytogenes NOT DETECTED NOT DETECTED Final   Staphylococcus species DETECTED (A) NOT DETECTED Final    Comment: Methicillin (oxacillin) susceptible coagulase negative staphylococcus. Possible blood culture contaminant (unless isolated from more than one  blood culture draw or clinical case suggests pathogenicity). No antibiotic treatment is indicated for blood  culture contaminants. PATIENT DISCHARGED OR EXPIRED DISCHARGED 05/28/18 JDW 2031    Staphylococcus aureus NOT DETECTED NOT DETECTED Final   Methicillin resistance NOT DETECTED NOT DETECTED Final   Streptococcus species NOT DETECTED NOT DETECTED Final   Streptococcus agalactiae NOT DETECTED NOT DETECTED Final   Streptococcus pneumoniae NOT DETECTED NOT DETECTED Final   Streptococcus pyogenes NOT DETECTED NOT DETECTED Final   Acinetobacter baumannii NOT DETECTED NOT DETECTED Final   Enterobacteriaceae species NOT DETECTED NOT DETECTED Final   Enterobacter cloacae complex NOT DETECTED NOT DETECTED Final   Escherichia coli NOT DETECTED NOT DETECTED Final   Klebsiella oxytoca NOT DETECTED NOT DETECTED Final   Klebsiella pneumoniae NOT DETECTED NOT DETECTED Final   Proteus species NOT DETECTED NOT DETECTED Final   Serratia marcescens NOT DETECTED NOT DETECTED Final   Haemophilus influenzae NOT DETECTED NOT DETECTED Final   Neisseria meningitidis NOT DETECTED NOT DETECTED Final   Pseudomonas aeruginosa NOT DETECTED NOT DETECTED Final   Candida albicans NOT DETECTED NOT DETECTED Final   Candida glabrata NOT DETECTED NOT DETECTED Final   Candida krusei NOT DETECTED NOT DETECTED Final   Candida parapsilosis NOT DETECTED NOT DETECTED Final   Candida tropicalis NOT DETECTED NOT DETECTED Final    Comment: Performed at [facility], [address] [place], [place] [address]     Labs: BNP (last 3 results) No results for input(s): BNP  in the last 8760 hours. Basic Metabolic Panel: Recent Labs  Lab 05/27/18 1750 05/27/18 2238 05/28/18 0406  NA 142  --  138  K 4.0  --  4.0  CL 104  --  105  CO2 25  --  25  GLUCOSE 113*  --  112*  BUN 19  --  16  CREATININE 1.06*  --  0.95  CALCIUM 9.4  --  8.8*  MG  --  2.3  --   PHOS  --  3.4  --    Liver Function Tests: Recent Labs  Lab 05/27/18 1750  AST 15  ALT 10  ALKPHOS 42  BILITOT 0.8  PROT 8.2*  ALBUMIN 4.3   Recent Labs  Lab 05/27/18 1750  LIPASE 38   Recent Labs  Lab 05/27/18 2238  AMMONIA 19   CBC: Recent Labs  Lab 05/27/18 1622 05/28/18 0406  WBC 11.4* 11.2*  NEUTROABS 9.5*  --   HGB 13.2 12.2  HCT 41.7 38.2  MCV 91.4 91.2  PLT 254 236   Cardiac Enzymes: Recent Labs  Lab 05/27/18 1622 05/27/18 2238  TROPONINI <0.03 <0.03   BNP: Invalid input(s): POCBNP CBG: Recent Labs  Lab 05/28/18 0754  GLUCAP 114*   D-Dimer No results for input(s): DDIMER in the last 72 hours. Hgb A1c No results for input(s): HGBA1C in the last 72 hours. Lipid Profile No results for input(s): CHOL, HDL, LDLCALC, TRIG, CHOLHDL, LDLDIRECT in the last 72 hours. Thyroid function studies Recent Labs    05/27/18 2238  TSH 6.346*   Anemia work up Recent Labs    05/27/18 2238  VITAMINB12 430   Urinalysis    Component Value Date/Time   COLORURINE YELLOW 05/27/2018 1622   APPEARANCEUR CLEAR 05/27/2018 1622   LABSPEC 1.013 05/27/2018 1622   PHURINE 8.0 05/27/2018 1622   GLUCOSEU NEGATIVE 05/27/2018 1622   HGBUR NEGATIVE 05/27/2018 1622   HGBUR negative 04/19/2010 1422   BILIRUBINUR NEGATIVE 05/27/2018 1622   BILIRUBINUR  1+ 06/24/2012 1447   KETONESUR NEGATIVE 05/27/2018 1622   PROTEINUR NEGATIVE 05/27/2018 1622   UROBILINOGEN 0.2 10/18/2014 2027   NITRITE NEGATIVE 05/27/2018 1622   LEUKOCYTESUR NEGATIVE 05/27/2018 1622   Sepsis Labs Invalid input(s): PROCALCITONIN,  WBC,  LACTICIDVEN Microbiology Recent Results (from the past 240 hour(s))   Culture, blood (routine x 2)     Status: None (Preliminary result)   Collection Time: 05/27/18 10:38 PM  Result Value Ref Range Status   Specimen Description   Final    BLOOD RIGHT ANTECUBITAL Performed at [facility], [address] [place], [place] [address]    Special Requests   Final    BOTTLES DRAWN AEROBIC ONLY Blood Culture adequate volume Performed at [facility], [address] [place], [place] [address]    Culture  Setup Time   Final    AEROBIC BOTTLE ONLY GRAM POSITIVE COCCI Organism ID to follow PATIENT DISCHARGED OR EXPIRED    Culture   Final    NO GROWTH 1 DAY Performed at [facility], [address] [place], [place] [address]    Report Status PENDING  Incomplete  Culture, blood (routine x 2)     Status: None (Preliminary result)   Collection Time: 05/27/18 10:38 PM  Result Value Ref Range Status   Specimen Description   Final    BLOOD RIGHT HAND Performed at [facility], [address], [place], [place] [address]    Special Requests   Final    BOTTLES DRAWN AEROBIC ONLY Blood Culture adequate volume Performed at [facility], [address] [place], [place] [address]    Culture   Final    NO GROWTH 1 DAY Performed at [facility], [address], [place], [place] [address]    Report Status PENDING  Incomplete  Blood Culture ID Panel (Reflexed)     Status: Abnormal   Collection Time: 05/27/18 10:38 PM  Result Value Ref Range Status   Enterococcus species NOT DETECTED NOT DETECTED Final   Listeria monocytogenes NOT DETECTED NOT DETECTED Final   Staphylococcus species DETECTED (A) NOT DETECTED Final    Comment: Methicillin (oxacillin) susceptible coagulase negative staphylococcus. Possible blood culture contaminant (unless isolated from more than one blood culture draw or clinical case suggests pathogenicity). No antibiotic treatment is indicated for blood  culture  contaminants. PATIENT DISCHARGED OR EXPIRED DISCHARGED 05/28/18 JDW 2031    Staphylococcus aureus NOT DETECTED NOT DETECTED Final   Methicillin resistance NOT DETECTED NOT DETECTED Final   Streptococcus species NOT DETECTED NOT DETECTED Final   Streptococcus agalactiae NOT DETECTED NOT DETECTED Final   Streptococcus pneumoniae NOT DETECTED NOT DETECTED Final   Streptococcus pyogenes NOT DETECTED NOT DETECTED Final   Acinetobacter baumannii NOT DETECTED NOT DETECTED Final   Enterobacteriaceae species NOT DETECTED NOT DETECTED Final   Enterobacter cloacae complex NOT DETECTED NOT DETECTED Final   Escherichia coli NOT DETECTED NOT DETECTED Final   Klebsiella oxytoca NOT DETECTED NOT DETECTED Final   Klebsiella pneumoniae NOT DETECTED NOT DETECTED Final   Proteus species NOT DETECTED NOT DETECTED Final   Serratia marcescens NOT DETECTED NOT DETECTED Final   Haemophilus influenzae NOT DETECTED NOT DETECTED Final   Neisseria meningitidis NOT DETECTED NOT DETECTED Final   Pseudomonas aeruginosa NOT DETECTED NOT DETECTED Final   Candida albicans NOT DETECTED NOT DETECTED Final   Candida glabrata NOT DETECTED NOT DETECTED Final   Candida krusei NOT DETECTED NOT DETECTED  Final   Candida parapsilosis NOT DETECTED NOT DETECTED Final   Candida tropicalis NOT DETECTED NOT DETECTED Final    Comment: Performed at [facility], [address], [place], [place] [address]     Time coordinating discharge: 32 minutes  SIGNED:   Kathlen Mody, MD  [facility] 05/29/2018, 8:40 AM Pager   If 7PM-7AM, please contact night-coverage [email or web address] Password [email or web address]

## 2018-05-30 LAB — CULTURE, BLOOD (ROUTINE X 2): Special Requests: ADEQUATE

## 2018-06-02 DIAGNOSIS — Z79899 Other long term (current) drug therapy: Secondary | ICD-10-CM | POA: Insufficient documentation

## 2018-06-02 LAB — CULTURE, BLOOD (ROUTINE X 2)
CULTURE: NO GROWTH
SPECIAL REQUESTS: ADEQUATE

## 2018-06-02 NOTE — Progress Notes (Signed)
HPI:   [name] is a 84 y.o. female, who is here today with her brother to follow on recent hospitalization. Former PCP Dr. Amador Cunas.  She has history of DM 2, hyperlipidemia, hypertension, atrial fibrillation status post cardiac pacemaker placement, dementia, and depression/anxiety among some.  Her son lives with her. He does not drive She has Hx of vascular dementia,so her son provides history.  She was admitted on 05/27/2018 and discharged on 05/28/2017.  Her son found her unresponsive on the couch at home. MS changes and declining functional status for the past few weeks, completely dependent in all ADLs and IADLs. Symptoms thought to be related to some of her medications.  Zoloft was decreased from 50 mg twice daily to 25 mg twice daily. She is also on lorazepam, which was decreased from 0.5 mg 3 times daily to 0.25 mg twice daily. She is also on Seroquel 25 mg twice daily,decreased from 50 mg bid.  Her son has noted better mood since meds were adjusted. Still anxious and some crying spells but not as bad as it was before hospital admission.  Son and patient refused SNF transfer, she went home and [facility] was arranged. HH was arranged but her son refused it because he did not feel like she was benefiting from service.  Abnormal TSH. No prior history of hypothyroidism, thyroid function test mildly abnormal.  Lab Results  Component Value Date   TSH 6.346 (H) 05/27/2018     Her son states that she does not need much assistance with ADL's. She needs assistance with bathing, she has a shower chair.  She does not use a cane or walker at home.   Hypertension: Currently she is on lisinopril 10 mg daily. No chest pain,dyspnea,or edema.  Hyperlipidemia, she is on simvastatin 40 mg daily.  Lab Results  Component Value Date   CHOL 208 (H) 06/17/2017   HDL 63.20 06/17/2017   LDLCALC 111 (H) 06/17/2017   LDLDIRECT 117.0 06/18/2016   TRIG 170.0 (H)  06/17/2017   CHOLHDL 3 06/17/2017   She is also on tramadol 50 mg every 8 hours as needed for pain. Her son give her Tramadol once daily as needed.  She c/o aches on different area of body and according to son this is not a new problem.He is not sure if she is in pain or "it is in her head." After she receives something for pain she sleeps for a couple hours and feels better,even if she gets Tylenol.   DM II, she is on nonpharmacologic treatment.  Lab Results  Component Value Date   HGBA1C 6.2 06/17/2017   Lab Results  Component Value Date   CREATININE 0.95 05/28/2018   BUN 16 05/28/2018   NA 138 05/28/2018   K 4.0 05/28/2018   CL 105 05/28/2018   CO2 25 05/28/2018     Yesterday evening her son was trimming her toenails, one started bleeding.Right 2nd toe started bleeding ,toenail was loose and cam off.  She has not c/o pain. He has not tried OTC medications.    Review of Systems  Constitutional: Positive for fatigue. Negative for activity change, appetite change and fever.  HENT: Negative for mouth sores, nosebleeds and trouble swallowing.   Respiratory: Negative for cough, shortness of breath and wheezing.   Cardiovascular: Negative for chest pain, palpitations and leg swelling.  Gastrointestinal: Negative for abdominal pain, nausea and vomiting.       Negative for changes in bowel habits.  Endocrine: Negative for polydipsia, polyphagia and polyuria.  Genitourinary: Negative for decreased urine volume, dysuria and hematuria.  Musculoskeletal: Positive for arthralgias and back pain.  Neurological: Negative for syncope, weakness and headaches.  Psychiatric/Behavioral: Positive for confusion. Negative for suicidal ideas. The patient is nervous/anxious.       Current Outpatient Medications on File Prior to Visit  Medication Sig Dispense Refill  . acetaminophen (TYLENOL) 500 MG tablet Take 1,000 mg by mouth every 6 (six) hours as needed for moderate pain (pain). For  pain    . famotidine (PEPCID) 20 MG tablet Take 1 tablet (20 mg total) by mouth 2 (two) times daily. 180 tablet 1  . lisinopril (PRINIVIL,ZESTRIL) 10 MG tablet Take 1 tablet (10 mg total) by mouth daily. 90 tablet 3  . LORazepam (ATIVAN) 0.5 MG tablet Take 0.5 tablets (0.25 mg total) by mouth every 12 (twelve) hours as needed for anxiety. (Patient taking differently: Take 0.25 mg by mouth every 12 (twelve) hours as needed for anxiety. ) 2 tablet 0  . Multiple Vitamin (MULTIVITAMIN WITH MINERALS) TABS tablet Take 1 tablet by mouth daily.    . QUEtiapine (SEROQUEL) 50 MG tablet Take 0.5 tablets (25 mg total) by mouth 2 (two) times daily. (Patient taking differently: Take 25 mg by mouth 2 (two) times daily. ) 180 tablet 0  . sertraline (ZOLOFT) 50 MG tablet TAKE 1 TABLET ONCE DAILY. 100 tablet 4  . simvastatin (ZOCOR) 40 MG tablet Take 1 tablet (40 mg total) by mouth daily at 6 PM. 30 tablet 2   No current facility-administered medications on file prior to visit.      Past Medical History:  Diagnosis Date  . ANEMIA, DEFICIENCY, HX OF 03/01/2010  . Anxiety   . DEPRESSIVE DISORDER 09/19/2010  . DM w/o Complication Type II 05/18/2007  . GERD 05/18/2007  . Glaucoma   . HYPERLIPIDEMIA 03/17/2008  . HYPERTENSION 05/18/2007  . INSOMNIA 11/23/2008  . Kidney stone   . Loss of weight 05/30/2010   Allergies  Allergen Reactions  . Amoxicillin Rash  . Penicillins Rash    Has patient had a PCN reaction causing immediate rash, facial/tongue/throat swelling, SOB or lightheadedness with hypotension: unknown Has patient had a PCN reaction causing severe rash involving mucus membranes or skin necrosis: unkonwn Has patient had a PCN reaction that required hospitalization unknown Has patient had a PCN reaction occurring within the last 10 years: No If all of the above ansers are "NO", then may proceed with Cephalosporin use.     Social History   Socioeconomic History  . Marital status: Widowed    Spouse  name: Not on file  . Number of children: 3  . Years of education: Not on file  . Highest education level: Not on file  Occupational History  . Occupation: retired  Engineer, production  . Financial resource strain: Not on file  . Food insecurity:    Worry: Not on file    Inability: Not on file  . Transportation needs:    Medical: Not on file    Non-medical: Not on file  Tobacco Use  . Smoking status: Never Smoker  . Smokeless tobacco: Never Used  Substance and Sexual Activity  . Alcohol use: No  . Drug use: No  . Sexual activity: Not on file  Lifestyle  . Physical activity:    Days per week: Not on file    Minutes per session: Not on file  . Stress: Not on file  Relationships  .  Social connections:    Talks on phone: Not on file    Gets together: Not on file    Attends religious service: Not on file    Active member of club or organization: Not on file    Attends meetings of clubs or organizations: Not on file    Relationship status: Not on file  Other Topics Concern  . Not on file  Social History Narrative  . Not on file    Vitals:   06/03/18 1443  BP: 120/70  Pulse: 68  Resp: 16  Temp: 99 F (37.2 C)  SpO2: 94%   Body mass index is 24.71 kg/m.   Physical Exam  Nursing note and vitals reviewed. Constitutional: She appears well-developed. She does not appear ill. No distress.  HENT:  Head: Normocephalic and atraumatic.  Mouth/Throat: Oropharynx is clear and moist and mucous membranes are normal.  Eyes: Pupils are equal, round, and reactive to light. Conjunctivae are normal.  Cardiovascular: Normal rate and regular rhythm.  No murmur heard. Pulses:      Dorsalis pedis pulses are 2+ on the right side, and 2+ on the left side.  Respiratory: Effort normal and breath sounds normal. No respiratory distress.  GI: Soft. She exhibits no mass. There is no hepatomegaly. There is no tenderness.  Musculoskeletal: She exhibits no edema.  Lymphadenopathy:    She has no  cervical adenopathy.  Neurological: She is alert. She has normal strength. She is disoriented. Gait abnormal.  Skin: Skin is warm. No rash noted. No erythema.  Right second toenail nail bed irritated and oozing, periungual erythema. No drainage or tenderness.  Psychiatric: Her mood appears anxious. Her affect is labile.  Fairly groomed, good eye contact.    ASSESSMENT AND PLAN:  [name] was seen today for transfer of care and hospitalization follow-up.   Orders Placed This Encounter  Procedures  . Flu vaccine HIGH DOSE PF    Diabetes mellitus without complication (HCC) Well controlled on non pharmacologic treatment. Son educated about foot care. She is upset today,so I will plan on re-checking A1C next visit.   Essential hypertension Adequately controlled. No changes in current management. DASH and low salt diet recommended. F/U in 6 months, before if needed.   Dementia with behavioral disturbance Son educated about Dx. Treatment options and prognosis discussed. I will hold on adding medications until some of her current meds are weaned off or decreased.  High risk medication use We discussed some side effects of current meds, so could contribute to MS changes. These meds can also increase risks of falls. Tramadol discontinued.  Anxiety No changes in dose of Lorazepam, 0.25 mg bid as needed. Side effects of benzos discussed. No changes in Zoloft. F/U in 4-6 weeks.   Syncope, unspecified syncope type  We discussed possible causes. She has not had an event since hospital discharge.   Major depressive disorder with current active episode, unspecified depression episode severity, unspecified whether recurrent  Improved with adjusting medication,not yet well controlled. For now no changes in Zoloft or Seroquel. Some side effects of meds discussed. Tramadol could also aggravate problems, it will be discontinued.  Avulsion of toenail of right foot  Soaking  right foot in water with Epson salt may help. Topical abx ointment recommended.  - mupirocin ointment (BACTROBAN) 2 %; Apply 1 application topically 2 (two) times daily.  Dispense: 30 g; Refill: 0  Encounter for immunization - Flu vaccine HIGH DOSE PF    Buell Parcel G. [place], MD  [place]. [place] office.

## 2018-06-03 ENCOUNTER — Ambulatory Visit (INDEPENDENT_AMBULATORY_CARE_PROVIDER_SITE_OTHER): Payer: PPO | Admitting: Family Medicine

## 2018-06-03 ENCOUNTER — Encounter: Payer: Self-pay | Admitting: Family Medicine

## 2018-06-03 VITALS — BP 120/70 | HR 68 | Temp 99.0°F | Resp 16 | Ht 63.0 in | Wt 139.5 lb

## 2018-06-03 DIAGNOSIS — F329 Major depressive disorder, single episode, unspecified: Secondary | ICD-10-CM | POA: Diagnosis not present

## 2018-06-03 DIAGNOSIS — R55 Syncope and collapse: Secondary | ICD-10-CM | POA: Diagnosis not present

## 2018-06-03 DIAGNOSIS — E119 Type 2 diabetes mellitus without complications: Secondary | ICD-10-CM

## 2018-06-03 DIAGNOSIS — I10 Essential (primary) hypertension: Secondary | ICD-10-CM

## 2018-06-03 DIAGNOSIS — F419 Anxiety disorder, unspecified: Secondary | ICD-10-CM

## 2018-06-03 DIAGNOSIS — Z23 Encounter for immunization: Secondary | ICD-10-CM

## 2018-06-03 DIAGNOSIS — F01518 Vascular dementia, unspecified severity, with other behavioral disturbance: Secondary | ICD-10-CM

## 2018-06-03 DIAGNOSIS — S91209A Unspecified open wound of unspecified toe(s) with damage to nail, initial encounter: Secondary | ICD-10-CM

## 2018-06-03 DIAGNOSIS — Z79899 Other long term (current) drug therapy: Secondary | ICD-10-CM

## 2018-06-03 DIAGNOSIS — F0151 Vascular dementia with behavioral disturbance: Secondary | ICD-10-CM | POA: Diagnosis not present

## 2018-06-03 LAB — CUP PACEART REMOTE DEVICE CHECK
Implantable Lead Implant Date: 20130128
Implantable Lead Location: 753859
Implantable Lead Location: 753860
Implantable Lead Model: 4135
Implantable Lead Model: 4136
Implantable Lead Serial Number: 29117362
Implantable Pulse Generator Implant Date: 20130128
MDC IDC LEAD IMPLANT DT: 20130128
MDC IDC LEAD SERIAL: 29118103
MDC IDC SESS DTM: 20191002115121
Pulse Gen Serial Number: 123684

## 2018-06-03 MED ORDER — MUPIROCIN 2 % EX OINT: 1.0000 "application " | TOPICAL_OINTMENT | Freq: Two times a day (BID) | CUTANEOUS | 0 refills | Status: DC

## 2018-06-03 NOTE — Assessment & Plan Note (Signed)
We discussed some side effects of current meds, so could contribute to MS changes. These meds can also increase risks of falls. Tramadol discontinued.

## 2018-06-03 NOTE — Assessment & Plan Note (Signed)
Adequately controlled. No changes in current management. DASH and low salt diet recommended. F/U in 6 months, before if needed.

## 2018-06-03 NOTE — Assessment & Plan Note (Signed)
No changes in dose of Lorazepam, 0.25 mg bid as needed. Side effects of benzos discussed. No changes in Zoloft. F/U in 4-6 weeks.

## 2018-06-03 NOTE — Assessment & Plan Note (Addendum)
Well controlled on non pharmacologic treatment. Son educated about foot care. She is upset today,so I will plan on re-checking A1C next visit.

## 2018-06-03 NOTE — Assessment & Plan Note (Signed)
Son educated about Dx. Treatment options and prognosis discussed. I will hold on adding medications until some of her current meds are weaned off or decreased.

## 2018-06-03 NOTE — Patient Instructions (Addendum)
A few things to remember from today's visit:   Syncope, unspecified syncope type  Essential hypertension  Diabetes mellitus without complication (HCC)  High risk medication use  Major depressive disorder with current active episode, unspecified depression episode severity, unspecified whether recurrent  Anxiety  Vascular dementia with behavior disturbance (HCC)  Stop Tramadol. Tylenol 500 mg 4 times per day if pain. Rest no changes.    Please be sure medication list is accurate. If a new problem present, please set up appointment sooner than planned today.

## 2018-06-04 ENCOUNTER — Telehealth: Payer: Self-pay | Admitting: *Deleted

## 2018-06-04 NOTE — Telephone Encounter (Signed)
Caller name: [facility] Can be reached: [phone] Last visit: 06/03/18   Reason for call: Bactroban ointment sent in for BID each nare, but patient was expecting an ointment for her foot. Pharmacy calling to clarify. They deliver to patient on Tu/Thur and want to deliver today if possible, otherwise patient's son has to hire an Benedetto Goad to bring him to the pharmacy to pick up meds. Reviewed office notes from yesterday, and do see documentation of foot injury, but no specific documentation of where Bactroban is supposed to be used. Pharmacy stated they will changed instructions to "apply to affected area BID." Notified them we would follow-up with patient directly when Dr. Swaziland returns if any further instructions.

## 2018-06-05 MED ORDER — MUPIROCIN 2 % EX OINT: 1.0000 "application " | TOPICAL_OINTMENT | Freq: Two times a day (BID) | CUTANEOUS | 0 refills | Status: DC

## 2018-06-05 NOTE — Telephone Encounter (Signed)
Mupirocin ointment to apply around affected toe. I re-sent prescription with clarification. Thanks, BJ

## 2018-06-22 ENCOUNTER — Other Ambulatory Visit: Payer: Self-pay | Admitting: Internal Medicine

## 2018-07-03 ENCOUNTER — Encounter: Payer: Self-pay | Admitting: Neurology

## 2018-07-03 ENCOUNTER — Ambulatory Visit (INDEPENDENT_AMBULATORY_CARE_PROVIDER_SITE_OTHER): Payer: PPO | Admitting: Neurology

## 2018-07-03 VITALS — BP 122/74 | HR 60 | Ht 65.0 in | Wt 145.0 lb

## 2018-07-03 DIAGNOSIS — F0391 Unspecified dementia with behavioral disturbance: Secondary | ICD-10-CM | POA: Diagnosis not present

## 2018-07-03 DIAGNOSIS — F03B18 Unspecified dementia, moderate, with other behavioral disturbance: Secondary | ICD-10-CM

## 2018-07-03 MED ORDER — DIVALPROEX SODIUM ER 250 MG PO TB24: ORAL_TABLET | ORAL | 11 refills | Status: DC

## 2018-07-03 NOTE — Progress Notes (Signed)
NEUROLOGY CONSULTATION NOTE  [name] MRN: [id number] DOB: 08-30-1923  Referring provider: Dr. Eleonore Chiquito Primary care provider: Dr. Betty Swaziland  Reason for consult:  dementia  Dear Dr Charmian Muff:  Thank you for your kind referral of [name] for consultation of the above symptoms. Although her history is well known to you, please allow me to reiterate it for the purpose of our medical record. The patient was accompanied to the clinic by her son who also provides collateral information. Records and images were personally reviewed where available.  HISTORY OF PRESENT ILLNESS: This is a 84 year old right-handed woman with a history of hypertension, hyperlipidemia, diabetes, atrial fibrillation s/p PPM, depression/anxiety, dementia, presenting for evaluation of dementia. Her son states he requested for this appointment "looking for something that will quiet her down a little more." She has a history of vascular dementia. She lives with her son who provides majority of history. She is hard of hearing, sometimes answering questions tangentially but may be due to hearing loss. She states her memory is average. She states "I'm not 31!" She states she takes her medications but her son has been managing them since he moved in with her in 2012. He started noticing changes around that time and he retired to take care of her. He initially noticed she was becoming disoriented, but now is forgetful all the time. She thinks her son is her husband who passed away in 46. Her son manages finances. She stopped driving in 4098. She is able to dress herself but has not taken a bath in 4.5 years. She is afraid to get in the shower, taking a sponge bath every 1-2 days. Her son has noticed behavioral changes dramatically worse over the past year. He feels she was on too much medication, she had decreased responsiveness in September 2019 and doses of Zoloft, lorazepam, and Seroquel were reduced.  Hereports that with decrease in dose of lorazepam and Seroquel, she is sleeping better and has less crying spells. She has paranoia, hiding things. He bought her underwear 3-4 months ago that he cannot find. Sometimes she says someone is in her bedroom or that her other son is home. Sometimes she gets angry really quick, cursing, and has been physical a time or two.  She has back pain. She denies any headaches, dizziness, diplopia, dysarthria/dysphagia, neck pain, focal numbness/tingling/weakness, bowel/bladder dysfunction, anosmia, or tremors. Sleep is good. Her 2 sisters had dementia. No history of significant head injuries or alcohol use.   I personally reviewed head CT without contrast done 05/27/18 which did not show any acute changes. There was moderate diffuse atrophy and chronic microvascular disease.  Laboratory Data: Lab Results  Component Value Date   TSH 6.346 (H) 05/27/2018   Lab Results  Component Value Date   VITAMINB12 430 05/27/2018     PAST MEDICAL HISTORY: Past Medical History:  Diagnosis Date  . ANEMIA, DEFICIENCY, HX OF 03/01/2010  . Anxiety   . DEPRESSIVE DISORDER 09/19/2010  . DM w/o Complication Type II 05/18/2007  . GERD 05/18/2007  . Glaucoma   . HYPERLIPIDEMIA 03/17/2008  . HYPERTENSION 05/18/2007  . INSOMNIA 11/23/2008  . Kidney stone   . Loss of weight 05/30/2010    PAST SURGICAL HISTORY: Past Surgical History:  Procedure Laterality Date  . ABDOMINAL HYSTERECTOMY    . APPENDECTOMY    . CATARACT EXTRACTION    . COLONOSCOPY  06-28-10   per Dr. Arlyce Dice, diverticulosis only   . DILATION AND  CURETTAGE OF UTERUS    . ESOPHAGOGASTRODUODENOSCOPY  05-13-11   per Dr. Arlyce Dice, normal   . HEMORRHOID SURGERY    . PACEMAKER INSERTION  09/2011  . PERMANENT PACEMAKER INSERTION N/A 09/30/2011   Procedure: PERMANENT PACEMAKER INSERTION;  Surgeon: Marinus Maw, MD;  Location[facility] CATH LAB;  Service: Cardiovascular;  Laterality: N/A;    MEDICATIONS: Current Outpatient  Medications on File Prior to Visit  Medication Sig Dispense Refill  . acetaminophen (TYLENOL) 500 MG tablet Take 1,000 mg by mouth every 6 (six) hours as needed for moderate pain (pain). For pain    . famotidine (PEPCID) 20 MG tablet Take 1 tablet (20 mg total) by mouth 2 (two) times daily. 180 tablet 1  . lisinopril (PRINIVIL,ZESTRIL) 10 MG tablet TAKE 1 TABLET ONCE DAILY. 90 tablet 0  . LORazepam (ATIVAN) 0.5 MG tablet Take 0.5 tablets (0.25 mg total) by mouth every 12 (twelve) hours as needed for anxiety. (Patient taking differently: Take 0.25 mg by mouth every 12 (twelve) hours as needed for anxiety. ) 2 tablet 0  . Multiple Vitamin (MULTIVITAMIN WITH MINERALS) TABS tablet Take 1 tablet by mouth daily.    . mupirocin ointment (BACTROBAN) 2 % Apply 1 application topically 2 (two) times daily. 30 g 0  . QUEtiapine (SEROQUEL) 50 MG tablet Take 0.5 tablets (25 mg total) by mouth 2 (two) times daily. (Patient taking differently: Take 25 mg by mouth 2 (two) times daily. ) 180 tablet 0  . sertraline (ZOLOFT) 50 MG tablet TAKE 1 TABLET ONCE DAILY. 100 tablet 4  . simvastatin (ZOCOR) 40 MG tablet Take 1 tablet (40 mg total) by mouth daily at 6 PM. 30 tablet 2   No current facility-administered medications on file prior to visit.     ALLERGIES: Allergies  Allergen Reactions  . Amoxicillin Rash  . Penicillins Rash    Has patient had a PCN reaction causing immediate rash, facial/tongue/throat swelling, SOB or lightheadedness with hypotension: unknown Has patient had a PCN reaction causing severe rash involving mucus membranes or skin necrosis: unkonwn Has patient had a PCN reaction that required hospitalization unknown Has patient had a PCN reaction occurring within the last 10 years: No If all of the above ansers are "NO", then may proceed with Cephalosporin use.     FAMILY HISTORY: Family History  Problem Relation Age of Onset  . Unexplained death Mother   . Brain cancer Father         brain tumor  . Colon cancer Sister   . Colitis Brother   . Heart attack Brother     SOCIAL HISTORY: Social History   Socioeconomic History  . Marital status: Widowed    Spouse name: Not on file  . Number of children: 3  . Years of education: Not on file  . Highest education level: Not on file  Occupational History  . Occupation: retired  Engineer, production  . Financial resource strain: Not on file  . Food insecurity:    Worry: Not on file    Inability: Not on file  . Transportation needs:    Medical: Not on file    Non-medical: Not on file  Tobacco Use  . Smoking status: Never Smoker  . Smokeless tobacco: Never Used  Substance and Sexual Activity  . Alcohol use: No  . Drug use: No  . Sexual activity: Not on file  Lifestyle  . Physical activity:    Days per week: Not on file    Minutes per  session: Not on file  . Stress: Not on file  Relationships  . Social connections:    Talks on phone: Not on file    Gets together: Not on file    Attends religious service: Not on file    Active member of club or organization: Not on file    Attends meetings of clubs or organizations: Not on file    Relationship status: Not on file  . Intimate partner violence:    Fear of current or ex partner: Not on file    Emotionally abused: Not on file    Physically abused: Not on file    Forced sexual activity: Not on file  Other Topics Concern  . Not on file  Social History Narrative  . Not on file    REVIEW OF SYSTEMS: Constitutional: No fevers, chills, or sweats, no generalized fatigue, change in appetite Eyes: No visual changes, double vision, eye pain Ear, nose and throat: No hearing loss, ear pain, nasal congestion, sore throat Cardiovascular: No chest pain, palpitations Respiratory:  No shortness of breath at rest or with exertion, wheezes GastrointestinaI: No nausea, vomiting, diarrhea, abdominal pain, fecal incontinence Genitourinary:  No dysuria, urinary retention or  frequency Musculoskeletal:  No neck pain,+ back pain Integumentary: No rash, pruritus, skin lesions Neurological: as above Psychiatric: No depression, insomnia, anxiety Endocrine: No palpitations, fatigue, diaphoresis, mood swings, change in appetite, change in weight, increased thirst Hematologic/Lymphatic:  No anemia, purpura, petechiae. Allergic/Immunologic: no itchy/runny eyes, nasal congestion, recent allergic reactions, rashes  PHYSICAL EXAM: Vitals:   07/03/18 1402  BP: 122/74  Pulse: 60  SpO2: 98%   General: No acute distress Head:  Normocephalic/atraumatic Eyes: Fundoscopic exam shows bilateral sharp discs, no vessel changes, exudates, or hemorrhages Neck: supple, no paraspinal tenderness, full range of motion Back: No paraspinal tenderness Heart: regular rate and rhythm Lungs: Clear to auscultation bilaterally. Vascular: No carotid bruits. Skin/Extremities: No rash, no edema Neurological Exam: Mental status: alert and oriented to person, place, no dysarthria or aphasia, Fund of knowledge is reduced. Recent and remote memory are impaired.  Attention and concentration are normal.    Able to name objects and repeat phrases. 4/5 MMSE - Mini Mental State Exam 07/03/2018  Orientation to time 0  Orientation to Place 4  Registration 3  Attention/ Calculation 4  Recall 0  Language- name 2 objects 2  Language- repeat 1  Language- follow 3 step command 0  Language- read & follow direction 1  Write a sentence 1  Copy design 0  Total score 16   Cranial nerves: CN I: not tested CN II: pupils equal, round and reactive to light, visual fields intact, fundi unremarkable. CN III, IV, VI:  full range of motion, no nystagmus, no ptosis CN V: facial sensation intact CN VII: upper and lower face symmetric CN VIII: hearing intact to finger rub CN IX, X: gag intact, uvula midline CN XI: sternocleidomastoid and trapezius muscles intact CN XII: tongue midline Bulk & Tone: normal, no  fasciculations. Motor: 5/5 throughout with no pronator drift. Sensation: intact to light touch, cold, pin, vibration and joint position sense.  No extinction to double simultaneous stimulation.  Romberg test negative Deep Tendon Reflexes: +2 throughout, no ankle clonus Plantar responses: downgoing bilaterally Cerebellar: no incoordination on finger to nose, heel to shin. No dysdiadochokinesia Gait: slow and cautious, no ataxia Tremor: none  IMPRESSION: This is a 84 year old right-handed woman with a history of  hypertension, hyperlipidemia, diabetes, atrial fibrillation s/p PPM,  depression/anxiety, dementia. Her son states he requested for this appointment "looking for something that will quiet her down a little more." Her neurological exam is non-focal, MMSE today 16/30, symptoms suggestive of moderate dementia with behavioral disturbance, likely vascular etiology. Her son's main concern are the behavioral changes, she had side effects on higher doses of lorazepam, Seroquel, and Zoloft. We discussed adding on low dose Depakote ER 250mg  qhs for mood stabilization, side effects were discussed, we may uptitrate as tolerated. Continue 24/7 supervision, I discussed advanced care planning with both the patient and her son, he is the primary caregiver. Resources were provided today. Follow-up in 6 months, he knows to call for any changes.   Thank you for allowing me to participate in the care of this patient. Please do not hesitate to call for any questions or concerns.   Patrcia Dolly, M.D.  CC: Dr. [place], Dr. Amador Cunas

## 2018-07-03 NOTE — Patient Instructions (Signed)
1. Start Depakote ER 250mg : Take 1 tablet every night 2. Continue with all your other medications 3. Continue 24/7 supervision 4. Follow-up in 6 months or so, call for any changes  FALL PRECAUTIONS: Be cautious when walking. Scan the area for obstacles that may increase the risk of trips and falls. When getting up in the mornings, sit up at the edge of the bed for a few minutes before getting out of bed. Consider elevating the bed at the head end to avoid drop of blood pressure when getting up. Walk always in a well-lit room (use night lights in the walls). Avoid area rugs or power cords from appliances in the middle of the walkways. Use a walker or a cane if necessary and consider physical therapy for balance exercise. Get your eyesight checked regularly.  HOME SAFETY: Consider the safety of the kitchen when operating appliances like stoves, microwave oven, and blender. Consider having supervision and share cooking responsibilities until no longer able to participate in those. Accidents with firearms and other hazards in the house should be identified and addressed as well.  ABILITY TO BE LEFT ALONE: If patient is unable to contact 911 operator, consider using LifeLine, or when the need is there, arrange for someone to stay with patients. Smoking is a fire hazard, consider supervision or cessation. Risk of wandering should be assessed by caregiver and if detected at any point, supervision and safe proof recommendations should be instituted.  RECOMMENDATIONS FOR ALL PATIENTS WITH MEMORY PROBLEMS: 1. Continue to exercise (Recommend 30 minutes of walking everyday, or 3 hours every week) 2. Increase social interactions - continue going to [place] and enjoy social gatherings with friends and family 3. Eat healthy, avoid fried foods and eat more fruits and vegetables 4. Maintain adequate blood pressure, blood sugar, and blood cholesterol level. Reducing the risk of stroke and cardiovascular disease also  helps promoting better memory. 5. Avoid stressful situations. Live a simple life and avoid aggravations. Organize your time and prepare for the next day in anticipation. 6. Sleep well, avoid any interruptions of sleep and avoid any distractions in the bedroom that may interfere with adequate sleep quality 7. Avoid sugar, avoid sweets as there is a strong link between excessive sugar intake, diabetes, and cognitive impairment The Mediterranean diet has been shown to help patients reduce the risk of progressive memory disorders and reduces cardiovascular risk. This includes eating fish, eat fruits and green leafy vegetables, nuts like almonds and hazelnuts, walnuts, and also use olive oil. Avoid fast foods and fried foods as much as possible. Avoid sweets and sugar as sugar use has been linked to worsening of memory function.  There is always a concern of gradual progression of memory problems. If this is the case, then we may need to adjust level of care according to patient needs. Support, both to the patient and caregiver, should then be put into place.

## 2018-07-15 ENCOUNTER — Encounter: Payer: Self-pay | Admitting: Family Medicine

## 2018-07-15 ENCOUNTER — Ambulatory Visit (INDEPENDENT_AMBULATORY_CARE_PROVIDER_SITE_OTHER): Payer: PPO | Admitting: Family Medicine

## 2018-07-15 VITALS — BP 120/78 | HR 82 | Temp 98.2°F | Resp 16 | Ht 65.0 in | Wt 145.5 lb

## 2018-07-15 DIAGNOSIS — M170 Bilateral primary osteoarthritis of knee: Secondary | ICD-10-CM | POA: Diagnosis not present

## 2018-07-15 DIAGNOSIS — F419 Anxiety disorder, unspecified: Secondary | ICD-10-CM

## 2018-07-15 DIAGNOSIS — E1149 Type 2 diabetes mellitus with other diabetic neurological complication: Secondary | ICD-10-CM

## 2018-07-15 DIAGNOSIS — F0151 Vascular dementia with behavioral disturbance: Secondary | ICD-10-CM

## 2018-07-15 DIAGNOSIS — F329 Major depressive disorder, single episode, unspecified: Secondary | ICD-10-CM

## 2018-07-15 DIAGNOSIS — F01518 Vascular dementia, unspecified severity, with other behavioral disturbance: Secondary | ICD-10-CM

## 2018-07-15 DIAGNOSIS — I10 Essential (primary) hypertension: Secondary | ICD-10-CM | POA: Diagnosis not present

## 2018-07-15 LAB — BASIC METABOLIC PANEL
BUN: 22 mg/dL (ref 6–23)
CALCIUM: 9.4 mg/dL (ref 8.4–10.5)
CHLORIDE: 102 meq/L (ref 96–112)
CO2: 30 mEq/L (ref 19–32)
CREATININE: 1.03 mg/dL (ref 0.40–1.20)
GFR: 53.12 mL/min — ABNORMAL LOW (ref >60.00)
Glucose, Bld: 105 mg/dL — ABNORMAL HIGH (ref 70–99)
POTASSIUM: 5.4 meq/L — AB (ref 3.5–5.1)
SODIUM: 140 meq/L (ref 135–145)

## 2018-07-15 LAB — MICROALBUMIN / CREATININE URINE RATIO
Creatinine,U: 121.1 mg/dL
MICROALB UR: 2.3 mg/dL — AB (ref 0.0–1.9)
Microalb Creat Ratio: 1.9 mg/g (ref 0.0–30.0)

## 2018-07-15 LAB — HEMOGLOBIN A1C: HEMOGLOBIN A1C: 6 % (ref 4.6–6.5)

## 2018-07-15 LAB — TSH: TSH: 2.94 u[IU]/mL (ref 0.35–4.50)

## 2018-07-15 MED ORDER — DICLOFENAC SODIUM 1 % TD GEL: 4.0000 g | Freq: Four times a day (QID) | TRANSDERMAL | 3 refills | Status: DC

## 2018-07-15 MED ORDER — QUETIAPINE FUMARATE 50 MG PO TABS: ORAL_TABLET | ORAL | 0 refills | Status: DC

## 2018-07-15 MED ORDER — METFORMIN HCL 500 MG PO TABS: 500.0000 mg | ORAL_TABLET | Freq: Every day | ORAL | 0 refills | Status: DC

## 2018-07-15 NOTE — Assessment & Plan Note (Signed)
BP adequately controlled. Continue low-salt diet. No changes in lisinopril 10 mg daily. Follow-up in 5 months, before if needed.

## 2018-07-15 NOTE — Assessment & Plan Note (Signed)
Symptoms have improved. We will continue weaning off Seroquel, recommend 25 mg daily for a week and then every other day for another week then discontinue. No changes in sertraline 50 mg. Instructed about warning signs. Follow-up in 5 months, before if needed.

## 2018-07-15 NOTE — Assessment & Plan Note (Signed)
HgA1C has been at goal. No changes in current management, metformin 500 mg daily. Regular exercise as tolerated and healthy diet with avoidance of added sugar food intake is an important part of treatment and recommended. Annual eye exam, periodic dental and foot care recommended. F/U in 5 months

## 2018-07-15 NOTE — Patient Instructions (Addendum)
A few things to remember from today's visit:   Type 2 diabetes mellitus with neurological complications (HCC) - Plan: Hemoglobin A1c, Basic metabolic panel  Essential hypertension - Plan: Basic metabolic panel, TSH  Major depressive disorder with current active episode, unspecified depression episode severity, unspecified whether recurrent  Vascular dementia with behavior disturbance (HCC)  Osteoarthritis of both knees, unspecified osteoarthritis type - Plan: diclofenac sodium (VOLTAREN) 1 % GEL   Seroquel half tablet daily for a week and then half tablet every other day for a week then stop medication. Rest of medications no changes. Voltaren gel started today for knee pain, continue Tylenol 500 mg every 8 hours as needed.   Please be sure medication list is accurate. If a new problem present, please set up appointment sooner than planned today.

## 2018-07-15 NOTE — Progress Notes (Signed)
HPI:   [name] is a 84 y.o. female, who is here today to follow on recent OV/ER.   Abnormal thyroid function test. Negative for constipation, abnormal weight loss, tremor, or cold/heat intolerance.  Lab Results  Component Value Date   TSH 6.346 (H) 05/27/2018   DM 2: Currently she is on metformin 500 mg at bedtime. She denies numbness or burning sensation on feet. Her son is not checking BS. Not sure about last eye exam. Denies abdominal pain, nausea,vomiting, polydipsia,polyuria, or polyphagia.   She has not been compliant with dietary recommendations, according to her son she eats several cookies per day.  Lab Results  Component Value Date   HGBA1C 6.2 06/17/2017     Lab Results  Component Value Date   MICROALBUR 1.7 05/11/2015   Hypertension: She is not checking BP at home. Currently she is on lisinopril 10 mg daily. Denies severe/frequent headache, visual changes, chest pain, dyspnea, palpitation, claudication, focal weakness, or edema.   Lab Results  Component Value Date   CREATININE 0.95 05/28/2018   BUN 16 05/28/2018   NA 138 05/28/2018   K 4.0 05/28/2018   CL 105 05/28/2018   CO2 25 05/28/2018    Depression and anxiety: Currently she is on sertraline 50 mg daily, Seroquel 50 mg at bedtime, and lorazepam 0.5 mg daily as needed. Her son gives her Ativan 0.5 tablet daily as needed. Symptoms have improved. Depakote 250 mg was started recently by Dr. Karel Jarvis, she has tolerated medication well.   Tramadol was discontinued last visit. Her son is giving her Tylenol a couple times during the day as needed. She still complains of knee pain intermittently.  Review of Systems  Constitutional: Negative for activity change, appetite change, fatigue and fever.  HENT: Negative for mouth sores, nosebleeds and trouble swallowing.   Eyes: Negative for redness and visual disturbance.  Respiratory: Negative for cough, shortness of breath and  wheezing.   Cardiovascular: Negative for chest pain, palpitations and leg swelling.  Gastrointestinal: Negative for abdominal pain, nausea and vomiting.       Negative for changes in bowel habits.  Endocrine: Negative for polydipsia, polyphagia and polyuria.  Genitourinary: Negative for decreased urine volume, dysuria and hematuria.  Musculoskeletal: Positive for arthralgias and gait problem.  Skin: Negative for rash and wound.  Neurological: Negative for syncope, weakness, numbness and headaches.  Psychiatric/Behavioral: Positive for sleep disturbance. Negative for confusion. The patient is nervous/anxious.       Current Outpatient Medications on File Prior to Visit  Medication Sig Dispense Refill  . acetaminophen (TYLENOL) 500 MG tablet Take 1,000 mg by mouth every 6 (six) hours as needed for moderate pain (pain). For pain    . divalproex (DEPAKOTE ER) 250 MG 24 hr tablet Take 1 tablet every night 30 tablet 11  . famotidine (PEPCID) 20 MG tablet Take 1 tablet (20 mg total) by mouth 2 (two) times daily. 180 tablet 1  . lisinopril (PRINIVIL,ZESTRIL) 10 MG tablet TAKE 1 TABLET ONCE DAILY. 90 tablet 0  . LORazepam (ATIVAN) 0.5 MG tablet Take 0.5 tablets (0.25 mg total) by mouth every 12 (twelve) hours as needed for anxiety. (Patient taking differently: Take 0.25 mg by mouth every 12 (twelve) hours as needed for anxiety. ) 2 tablet 0  . Multiple Vitamin (MULTIVITAMIN WITH MINERALS) TABS tablet Take 1 tablet by mouth daily.    . mupirocin ointment (BACTROBAN) 2 % Apply 1 application topically 2 (two) times daily. 30 g  0  . sertraline (ZOLOFT) 50 MG tablet TAKE 1 TABLET ONCE DAILY. 100 tablet 4  . simvastatin (ZOCOR) 40 MG tablet Take 1 tablet (40 mg total) by mouth daily at 6 PM. 30 tablet 2   No current facility-administered medications on file prior to visit.      Past Medical History:  Diagnosis Date  . ANEMIA, DEFICIENCY, HX OF 03/01/2010  . Anxiety   . DEPRESSIVE DISORDER 09/19/2010    . DM w/o Complication Type II 05/18/2007  . GERD 05/18/2007  . Glaucoma   . HYPERLIPIDEMIA 03/17/2008  . HYPERTENSION 05/18/2007  . INSOMNIA 11/23/2008  . Kidney stone   . Loss of weight 05/30/2010   Allergies  Allergen Reactions  . Amoxicillin Rash  . Penicillins Rash    Has patient had a PCN reaction causing immediate rash, facial/tongue/throat swelling, SOB or lightheadedness with hypotension: unknown Has patient had a PCN reaction causing severe rash involving mucus membranes or skin necrosis: unkonwn Has patient had a PCN reaction that required hospitalization unknown Has patient had a PCN reaction occurring within the last 10 years: No If all of the above ansers are "NO", then may proceed with Cephalosporin use.     Social History   Socioeconomic History  . Marital status: Widowed    Spouse name: Not on file  . Number of children: 3  . Years of education: Not on file  . Highest education level: Not on file  Occupational History  . Occupation: retired  Engineer, production  . Financial resource strain: Not on file  . Food insecurity:    Worry: Not on file    Inability: Not on file  . Transportation needs:    Medical: Not on file    Non-medical: Not on file  Tobacco Use  . Smoking status: Never Smoker  . Smokeless tobacco: Never Used  Substance and Sexual Activity  . Alcohol use: No  . Drug use: No  . Sexual activity: Not on file  Lifestyle  . Physical activity:    Days per week: Not on file    Minutes per session: Not on file  . Stress: Not on file  Relationships  . Social connections:    Talks on phone: Not on file    Gets together: Not on file    Attends religious service: Not on file    Active member of club or organization: Not on file    Attends meetings of clubs or organizations: Not on file    Relationship status: Not on file  Other Topics Concern  . Not on file  Social History Narrative  . Not on file    Vitals:   07/15/18 1410  BP: 120/78  Pulse:  82  Resp: 16  Temp: 98.2 F (36.8 C)  SpO2: 95%   Body mass index is 24.21 kg/m.    Physical Exam  Nursing note and vitals reviewed. Constitutional: She appears well-developed and well-nourished. No distress.  HENT:  Head: Normocephalic and atraumatic.  Mouth/Throat: Oropharynx is clear and moist and mucous membranes are normal.  Eyes: Pupils are equal, round, and reactive to light. Conjunctivae are normal.  Cardiovascular: Normal rate and regular rhythm.  No murmur heard. Pulses:      Dorsalis pedis pulses are 2+ on the right side, and 2+ on the left side.  Respiratory: Effort normal and breath sounds normal. No respiratory distress.  GI: Soft. She exhibits no mass. There is no hepatomegaly. There is no tenderness.  Musculoskeletal: She exhibits  no edema.       Right knee: She exhibits normal range of motion, no effusion and no erythema.       Left knee: She exhibits normal range of motion, no effusion and no erythema.  Mild crepitus of knees with ROM, no pain elicited.   Lymphadenopathy:    She has no cervical adenopathy.  Neurological: She is alert. She has normal strength. She is disoriented. No cranial nerve deficit. Gait abnormal.  Skin: Skin is warm. Ecchymosis noted. No rash noted. No erythema.  Psychiatric: Her mood appears anxious.  Well groomed, good eye contact.   Diabetic Foot Exam - Simple   Simple Foot Form Diabetic Foot exam was performed with the following findings:  Yes 07/15/2018  3:03 PM  Visual Inspection Sensation Testing See comments:  Yes Pulse Check Posterior Tibialis and Dorsalis pulse intact bilaterally:  Yes Comments Monofilament absent bilateral. Hypertrophic and long toenails. She has a small ecchymosis great toe,subungual.      ASSESSMENT AND PLAN:  Lennette was seen today for follow-up.  Diagnoses and all orders for this visit:  Type 2 diabetes mellitus with neurological complications (HCC) -     Hemoglobin A1c -     Basic  metabolic panel -     Microalbumin / creatinine urine ratio -     metFORMIN (GLUCOPHAGE) 500 MG tablet; Take 1 tablet (500 mg total) by mouth daily with breakfast.  Essential hypertension -     Basic metabolic panel -     TSH  Major depressive disorder with current active episode, unspecified depression episode severity, unspecified whether recurrent  Vascular dementia with behavior disturbance (HCC)  Osteoarthritis of both knees, unspecified osteoarthritis type -     diclofenac sodium (VOLTAREN) 1 % GEL; Apply 4 g topically 4 (four) times daily.  Anxiety  Other orders -     QUEtiapine (SEROQUEL) 50 MG tablet; Half tablet daily at night for a week then half tablet every other day and then discontinue it.     Orders Placed This Encounter  Procedures  . Hemoglobin A1c  . Basic metabolic panel  . TSH  . Microalbumin / creatinine urine ratio    Lab Results  Component Value Date   MICROALBUR 2.3 (H) 07/15/2018    Lab Results  Component Value Date   HGBA1C 6.0 07/15/2018   Lab Results  Component Value Date   CREATININE 1.03 07/15/2018   BUN 22 07/15/2018   NA 140 07/15/2018   K 5.4 (H) 07/15/2018   CL 102 07/15/2018   CO2 30 07/15/2018   Lab Results  Component Value Date   TSH 2.94 07/15/2018    Type 2 diabetes mellitus with neurological complications (HCC) HgA1C has been at goal. No changes in current management, metformin 500 mg daily. Regular exercise as tolerated and healthy diet with avoidance of added sugar food intake is an important part of treatment and recommended. Annual eye exam, periodic dental and foot care recommended. F/U in 5 months   Essential hypertension BP adequately controlled. Continue low-salt diet. No changes in lisinopril 10 mg daily. Follow-up in 5 months, before if needed.  Depression, major Symptoms have improved. We will continue weaning off Seroquel, recommend 25 mg daily for a week and then every other day for another  week then discontinue. No changes in sertraline 50 mg. Instructed about warning signs. Follow-up in 5 months, before if needed.  Anxiety Improved. No changes in alprazolam 0.5 mg daily as needed. Continue Seroquel 50 mg  daily. Follow-up in 5 months, before if needed.  Osteoarthritis of both knees She will continue acetaminophen 500 mg 3 times daily as needed. Voltaren gel 4 times per day as needed started today. Fall precautions discussed.   Vascular dementia with behavior disturbance [facility](HCC) Son is her caregiver, educated about Dx and prognosis. She will continue following with Dr Karel JarvisAquino. Mood and behavior improved since her last OV and after mediations have been adjusted/discontinued.   Return in about 5 months (around 12/14/2018) for DM II,HTN,depression.     Dshaun Reppucci G. [place], MD  [facility]. [place] office.

## 2018-07-15 NOTE — Assessment & Plan Note (Signed)
Improved. No changes in alprazolam 0.5 mg daily as needed. Continue Seroquel 50 mg daily. Follow-up in 5 months, before if needed.

## 2018-07-15 NOTE — Assessment & Plan Note (Signed)
She will continue acetaminophen 500 mg 3 times daily as needed. Voltaren gel 4 times per day as needed started today. Fall precautions discussed.

## 2018-07-18 ENCOUNTER — Encounter: Payer: Self-pay | Admitting: Family Medicine

## 2018-07-23 ENCOUNTER — Other Ambulatory Visit: Payer: Self-pay | Admitting: Internal Medicine

## 2018-07-23 NOTE — Telephone Encounter (Signed)
Okay for refill? Please advise 

## 2018-07-27 NOTE — Telephone Encounter (Signed)
Noted  

## 2018-07-27 NOTE — Telephone Encounter (Signed)
Prescription for lorazepam 0.5 mg to continue once daily as needed was already sent to her pharmacy. Thanks, BJ

## 2018-07-27 NOTE — Telephone Encounter (Signed)
Message routed to PCP for refills.  Okay for refills?

## 2018-08-17 ENCOUNTER — Other Ambulatory Visit: Payer: Self-pay | Admitting: Family Medicine

## 2018-08-20 ENCOUNTER — Ambulatory Visit (INDEPENDENT_AMBULATORY_CARE_PROVIDER_SITE_OTHER): Payer: PPO

## 2018-08-20 DIAGNOSIS — R001 Bradycardia, unspecified: Secondary | ICD-10-CM

## 2018-08-20 NOTE — Progress Notes (Signed)
Remote pacemaker transmission.   

## 2018-08-24 ENCOUNTER — Other Ambulatory Visit: Payer: Self-pay

## 2018-08-24 ENCOUNTER — Emergency Department (HOSPITAL_COMMUNITY): Payer: PPO

## 2018-08-24 ENCOUNTER — Emergency Department (HOSPITAL_COMMUNITY)
Admission: EM | Admit: 2018-08-24 | Discharge: 2018-08-24 | Disposition: A | Discharge status: Home / Self Care | Payer: PPO | Attending: Emergency Medicine | Admitting: Emergency Medicine

## 2018-08-24 ENCOUNTER — Encounter (HOSPITAL_COMMUNITY): Payer: Self-pay | Admitting: Emergency Medicine

## 2018-08-24 DIAGNOSIS — I10 Essential (primary) hypertension: Secondary | ICD-10-CM | POA: Insufficient documentation

## 2018-08-24 DIAGNOSIS — I4510 Unspecified right bundle-branch block: Secondary | ICD-10-CM | POA: Diagnosis not present

## 2018-08-24 DIAGNOSIS — F29 Unspecified psychosis not due to a substance or known physiological condition: Secondary | ICD-10-CM | POA: Diagnosis not present

## 2018-08-24 DIAGNOSIS — F0390 Unspecified dementia without behavioral disturbance: Secondary | ICD-10-CM | POA: Diagnosis not present

## 2018-08-24 DIAGNOSIS — E119 Type 2 diabetes mellitus without complications: Secondary | ICD-10-CM | POA: Diagnosis not present

## 2018-08-24 DIAGNOSIS — Z950 Presence of cardiac pacemaker: Secondary | ICD-10-CM | POA: Diagnosis not present

## 2018-08-24 DIAGNOSIS — R4020 Unspecified coma: Secondary | ICD-10-CM | POA: Diagnosis not present

## 2018-08-24 DIAGNOSIS — Z79899 Other long term (current) drug therapy: Secondary | ICD-10-CM | POA: Insufficient documentation

## 2018-08-24 DIAGNOSIS — R4182 Altered mental status, unspecified: Secondary | ICD-10-CM | POA: Diagnosis not present

## 2018-08-24 DIAGNOSIS — I4891 Unspecified atrial fibrillation: Secondary | ICD-10-CM | POA: Diagnosis not present

## 2018-08-24 LAB — URINALYSIS, ROUTINE W REFLEX MICROSCOPIC
Bilirubin Urine: NEGATIVE
Glucose, UA: NEGATIVE mg/dL
HGB URINE DIPSTICK: NEGATIVE
Ketones, ur: NEGATIVE mg/dL
Leukocytes, UA: NEGATIVE
Nitrite: NEGATIVE
PH: 6 (ref 5.0–8.0)
Protein, ur: NEGATIVE mg/dL
SPECIFIC GRAVITY, URINE: 1.011 (ref 1.005–1.030)

## 2018-08-24 LAB — COMPREHENSIVE METABOLIC PANEL
ALK PHOS: 39 U/L (ref 38–126)
ALT: 10 U/L (ref 0–44)
AST: 14 U/L — AB (ref 15–41)
Albumin: 3.7 g/dL (ref 3.5–5.0)
Anion gap: 8 (ref 5–15)
BILIRUBIN TOTAL: 0.7 mg/dL (ref 0.3–1.2)
BUN: 20 mg/dL (ref 8–23)
CALCIUM: 8.7 mg/dL — AB (ref 8.9–10.3)
CO2: 24 mmol/L (ref 22–32)
CREATININE: 1.05 mg/dL — AB (ref 0.44–1.00)
Chloride: 108 mmol/L (ref 98–111)
GFR calc Af Amer: 53 mL/min — ABNORMAL LOW (ref >60)
GFR, EST NON AFRICAN AMERICAN: 46 mL/min — AB (ref >60)
Glucose, Bld: 101 mg/dL — ABNORMAL HIGH (ref 70–99)
Potassium: 5.1 mmol/L (ref 3.5–5.1)
Sodium: 140 mmol/L (ref 135–145)
TOTAL PROTEIN: 6.9 g/dL (ref 6.5–8.1)

## 2018-08-24 LAB — CBC WITH DIFFERENTIAL/PLATELET
ABS IMMATURE GRANULOCYTES: 0.04 10*3/uL (ref 0.00–0.07)
BASOS PCT: 0 %
Basophils Absolute: 0 10*3/uL (ref 0.0–0.1)
EOS ABS: 0 10*3/uL (ref 0.0–0.5)
EOS PCT: 0 %
HCT: 41.6 % (ref 36.0–46.0)
Hemoglobin: 12.9 g/dL (ref 12.0–15.0)
Immature Granulocytes: 0 %
Lymphocytes Relative: 22 %
Lymphs Abs: 2.5 10*3/uL (ref 0.7–4.0)
MCH: 28.8 pg (ref 26.0–34.0)
MCHC: 31 g/dL (ref 30.0–36.0)
MCV: 92.9 fL (ref 80.0–100.0)
MONO ABS: 0.6 10*3/uL (ref 0.1–1.0)
MONOS PCT: 5 %
NEUTROS ABS: 8.1 10*3/uL — AB (ref 1.7–7.7)
Neutrophils Relative %: 73 %
PLATELETS: 259 10*3/uL (ref 150–400)
RBC: 4.48 MIL/uL (ref 3.87–5.11)
RDW: 13.4 % (ref 11.5–15.5)
WBC: 11.3 10*3/uL — AB (ref 4.0–10.5)
nRBC: 0 % (ref 0.0–0.2)

## 2018-08-24 LAB — RAPID URINE DRUG SCREEN, HOSP PERFORMED
Amphetamines: NOT DETECTED
BARBITURATES: NOT DETECTED
Benzodiazepines: NOT DETECTED
Cocaine: NOT DETECTED
OPIATES: NOT DETECTED
TETRAHYDROCANNABINOL: NOT DETECTED

## 2018-08-24 LAB — I-STAT TROPONIN, ED: Troponin i, poc: 0.02 ng/mL (ref 0.00–0.08)

## 2018-08-24 LAB — AMMONIA: Ammonia: 21 umol/L (ref 9–35)

## 2018-08-24 LAB — LIPASE, BLOOD: LIPASE: 37 U/L (ref 11–51)

## 2018-08-24 LAB — I-STAT CG4 LACTIC ACID, ED: Lactic Acid, Venous: 1.91 mmol/L — ABNORMAL HIGH (ref 0.5–1.9)

## 2018-08-24 NOTE — ED Provider Notes (Signed)
Patient sitting upright, on the edge of the bed. Remaining labs unremarkable. I discussed all findings again with the patient's family members. With no evidence for acute change, no evidence for distress, and return to baseline behavior, the patient will return with family members home.   [name], Cindy Albus, MD 08/24/18 [phone]

## 2018-08-24 NOTE — ED Provider Notes (Signed)
[facility]-EMERGENCY DEPT Provider Note   CSN: [id number] Arrival date & time: 08/24/18  1326     History   Chief Complaint Chief Complaint  Patient presents with  . Altered Mental Status    HPI [name] is a 84 y.o. female.  HPI Patient is sent to the emergency department for reported change in mental status.  At this time, only history is by EMS report.  Patient has known history of dementia.  Reportedly there was new onset of being nonverbal with the patient's eyes being closed just prior to EMS arrival.  Reportedly something similar happened about 8 to 9 weeks ago.  There is marked in quoted " got upset because she did not want to go where they were going out to eat so she laid on the couch with eyes closed and would not talk".  I have tried to call the patient's son who is listed a contact at the cell phone number listed, no answer mailbox is full.  I have also tried to call the listed home number which went to an answering machine.  I did leave a message requesting call back.  Per the patient, she does not know why she is here.  She has requested some more blankets because she is cold.  She knows that she lives in [place] but cannot say much more about with whom she lives or where she lives. Past Medical History:  Diagnosis Date  . ANEMIA, DEFICIENCY, HX OF 03/01/2010  . Anxiety   . DEPRESSIVE DISORDER 09/19/2010  . DM w/o Complication Type II 05/18/2007  . GERD 05/18/2007  . Glaucoma   . HYPERLIPIDEMIA 03/17/2008  . HYPERTENSION 05/18/2007  . INSOMNIA 11/23/2008  . Kidney stone   . Loss of weight 05/30/2010    Patient Active Problem List   Diagnosis Date Noted  . Osteoarthritis of both knees 07/15/2018  . High risk medication use 06/02/2018  . Syncope 05/27/2018  . 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 UTI (urinary tract infection) 08/19/2017  . Dementia with behavioral disturbance (HCC)  08/19/2017  . Anxiety 10/27/2012  . Symptomatic bradycardia 10/01/2011  . Status post cardiac pacemaker procedure 10/01/2011  . Atrial fibrillation (HCC) 09/29/2011  . Pain disorder 04/19/2011  . Chest pain, atypical 02/20/2011  . Depression, major 09/19/2010  . LOSS OF WEIGHT 05/30/2010  . ANEMIA, DEFICIENCY, HX OF 03/01/2010  . INSOMNIA 11/23/2008  . Dyslipidemia 03/17/2008  . 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 Essential hypertension 05/18/2007  . GERD 05/18/2007    Past Surgical History:  Procedure Laterality Date  . ABDOMINAL HYSTERECTOMY    . APPENDECTOMY    . CATARACT EXTRACTION    . COLONOSCOPY  06-28-10   per Dr. Arlyce Dice, diverticulosis only   . DILATION AND CURETTAGE OF UTERUS    . ESOPHAGOGASTRODUODENOSCOPY  05-13-11   per Dr. Arlyce Dice, normal   . HEMORRHOID SURGERY    . PACEMAKER INSERTION  09/2011  . PERMANENT PACEMAKER INSERTION N/A 09/30/2011   Procedure: PERMANENT PACEMAKER INSERTION;  Surgeon: Marinus Maw, MD;  Location: [facility] CATH LAB;  Service: Cardiovascular;  Laterality: N/A;     OB History    Gravida      Para      Term      Preterm      AB      Living  3     SAB      TAB  Ectopic      Multiple      Live Births               Home Medications    Prior to Admission medications   Medication Sig Start Date End Date Taking? Authorizing Provider  acetaminophen (TYLENOL) 500 MG tablet Take 1,000 mg by mouth every 6 (six) hours as needed for moderate pain (pain). For pain    [provider]  diclofenac sodium (VOLTAREN) 1 % GEL Apply 4 g topically 4 (four) times daily. 07/15/18   SwazilandJordan, Betty G, MD  divalproex (DEPAKOTE ER) 250 MG 24 hr tablet Take 1 tablet every night 07/03/18   Van ClinesAquino, Karen M, MD  famotidine (PEPCID) 20 MG tablet Take 1 tablet (20 mg total) by mouth 2 (two) times daily. 04/06/18   Gordy SaversKwiatkowski, Peter F, MD  lisinopril (PRINIVIL,ZESTRIL) 10 MG tablet TAKE 1 TABLET ONCE  DAILY. 06/22/18   Roderick Peeodd, Jeffrey A, MD  LORazepam (ATIVAN) 0.5 MG tablet Take 0.5-1 tablets (0.25-0.5 mg total) by mouth daily as needed for anxiety. 07/27/18   SwazilandJordan, Betty G, MD  metFORMIN (GLUCOPHAGE) 500 MG tablet Take 1 tablet (500 mg total) by mouth daily with breakfast. 07/15/18   [place]azilandJordan, Betty G, MD  Multiple Vitamin (MULTIVITAMIN WITH MINERALS) TABS tablet Take 1 tablet by mouth daily. 09/04/17   Gordy SaversKwiatkowski, Peter F, MD  mupirocin ointment (BACTROBAN) 2 % Apply 1 application topically 2 (two) times daily. 06/05/18   [place]azilandJordan, Betty G, MD  QUEtiapine (SEROQUEL) 50 MG tablet Half tablet daily at night for a week then half tablet every other day and then discontinue it. 07/15/18 07/29/18  [place]ilandJordan, Betty G, MD  sertraline (ZOLOFT) 50 MG tablet TAKE 1 TABLET ONCE DAILY. 05/26/18   Roderick Peeodd, Jeffrey A, MD  simvastatin (ZOCOR) 40 MG tablet TAKE 1 TABLET ONCE DAILY AT 6 PM. 08/17/18   SwazilandJordan, Betty G, MD    Family History Family History  Problem Relation Age of Onset  . Unexplained death Mother   . Brain cancer Father        brain tumor  . Colon cancer Sister   . Colitis Brother   . Heart attack Brother     Social History Social History   Tobacco Use  . Smoking status: Never Smoker  . Smokeless tobacco: Never Used  Substance Use Topics  . Alcohol use: No  . Drug use: No     Allergies   Amoxicillin and Penicillins   Review of Systems Review of Systems Level 5 caveat cannot obtain review of systems due to dementia  Physical Exam Updated Vital Signs BP (!) 149/96 (BP Location: Left Arm)   Pulse 96   Temp 98.4 F (36.9 C) (Oral)   Resp 18   SpO2 100%   Physical Exam Constitutional:      Comments: Patient has alert appearance.  She is interacting with me.  She has no respiratory distress.  Color is good.  HENT:     Head: Normocephalic and atraumatic.     Nose: Nose normal.     Mouth/Throat:     Mouth: Mucous membranes are moist.  Eyes:     Extraocular Movements:  Extraocular movements intact.  Cardiovascular:     Rate and Rhythm: Normal rate and regular rhythm.  Pulmonary:     Effort: Pulmonary effort is normal.     Breath sounds: Normal breath sounds.  Abdominal:     General: There is no distension.     Palpations: Abdomen  is soft.     Tenderness: There is no abdominal tenderness. There is no guarding.  Musculoskeletal: Normal range of motion.        General: No swelling, tenderness, deformity or signs of injury.     Right lower leg: No edema.     Left lower leg: No edema.  Skin:    General: Skin is warm and dry.  Neurological:     Comments: Patient is alert.  She is communicating with me but seems significantly demented.  She will answer the first part of question but the rest is "I do not know".  She can purposely move all 4 extremities.      ED Treatments / Results  Labs (all labs ordered are listed, but only abnormal results are displayed) Labs Reviewed  CBC WITH DIFFERENTIAL/PLATELET - Abnormal; Notable for the following components:      Result Value   WBC 11.3 (*)    Neutro Abs 8.1 (*)    All other components within normal limits  URINALYSIS, ROUTINE W REFLEX MICROSCOPIC  COMPREHENSIVE METABOLIC PANEL  LIPASE, BLOOD  RAPID URINE DRUG SCREEN, HOSP PERFORMED  AMMONIA  I-STAT CG4 LACTIC ACID, ED  I-STAT TROPONIN, ED    EKG EKG Interpretation  Date/Time:  Monday August 24 2018 15:19:16 EST Ventricular Rate:  78 PR Interval:    QRS Duration: 150 QT Interval:  411 QTC Calculation: 469 R Axis:   -55 Text Interpretation:  Atrial flutter with predominant 3:1 AV block RBBB and LAFB agree. QRRS morphology unchanged from previous. todays not paced Confirmed by Arby Barrettefeiffer, Eschol Auxier [phone]([id number]) on 08/24/2018 3:27:23 PM   Radiology Dg Chest Port 1 View  Result Date: 08/24/2018 CLINICAL DATA:  84 year old female with mental status change. EXAM: PORTABLE CHEST 1 VIEW COMPARISON:  05/27/2018 and earlier. FINDINGS: Portable AP semi  upright view at 1439 hours. Lower lung volumes. Stable cardiac size and mediastinal contours. Stable left chest dual lead cardiac pacemaker. Allowing for portable technique the lungs are clear. No pneumothorax, pleural effusion or confluent pulmonary opacity. Paucity bowel gas in the upper abdomen. No acute osseous abnormality identified. IMPRESSION: No acute cardiopulmonary abnormality. Electronically Signed   By: Odessa FlemingH  Hall M.D.   On: 08/24/2018 15:02    Procedures Procedures (including critical care time)  Medications Ordered in ED Medications - No data to display   Initial Impression / Assessment and Plan / ED Course  I have reviewed the triage vital signs and the nursing notes.  Pertinent labs & imaging results that were available during my care of the patient were reviewed by me and considered in my medical decision making (see chart for details).    Patient has been sent by EMS for mental status change.  At this time, no additional history to clarify type of change.  Patient is alert and demented.  Diagnostic evaluation to rule out infectious or metabolic etiology is pending.  Phone calls have been placed to contact listed in demographics.  Awaiting response.  Final Clinical Impressions(s) / ED Diagnoses   Final diagnoses:  Altered mental status, unspecified altered mental status type  Dementia without behavioral disturbance, unspecified dementia type [facility](HCC)    ED Discharge Orders    None       Arby BarrettePfeiffer, Aundray Cartlidge, MD 08/24/18 1527

## 2018-08-24 NOTE — ED Triage Notes (Addendum)
Per EMS pt from home with hx of dementia new onset nonverbal with eyes closed just prior to EMS arrival; hx of same 8-9 weeks ago.   "Got upset because she didn't want to to go where they were going out to eat so she laid on the cough with eyes closed and would not talk."

## 2018-08-24 NOTE — ED Notes (Signed)
Assisted patient to restroom and back to stretcher. Patient tolerated it well.

## 2018-08-24 NOTE — Discharge Instructions (Signed)
As discussed, your evaluation today has been largely reassuring.  But, it is important that you monitor your condition carefully, and do not hesitate to return to the ED if you develop new, or concerning changes in your condition. ? ?Otherwise, please follow-up with your physician for appropriate ongoing care. ? ?

## 2018-09-14 ENCOUNTER — Other Ambulatory Visit: Payer: Self-pay | Admitting: Family Medicine

## 2018-09-17 ENCOUNTER — Other Ambulatory Visit: Payer: Self-pay | Admitting: Family Medicine

## 2018-09-20 LAB — CUP PACEART REMOTE DEVICE CHECK
Date Time Interrogation Session: 20200119073707
Implantable Lead Implant Date: 20130128
Implantable Lead Implant Date: 20130128
Implantable Lead Location: 753859
Implantable Lead Location: 753860
Implantable Lead Model: 4135
Implantable Lead Model: 4136
Implantable Lead Serial Number: 29117362
Implantable Lead Serial Number: 29118103
Implantable Pulse Generator Implant Date: 20130128
Pulse Gen Serial Number: 123684

## 2018-09-23 ENCOUNTER — Other Ambulatory Visit: Payer: Self-pay | Admitting: *Deleted

## 2018-09-23 MED ORDER — LISINOPRIL 10 MG PO TABS: 10.0000 mg | ORAL_TABLET | Freq: Every day | ORAL | 3 refills | Status: AC

## 2018-10-16 ENCOUNTER — Other Ambulatory Visit: Payer: Self-pay | Admitting: Family Medicine

## 2018-10-23 ENCOUNTER — Other Ambulatory Visit: Payer: Self-pay | Admitting: Pharmacist

## 2018-10-23 NOTE — Patient Outreach (Signed)
[facility]) Care Management  10/23/2018  [name] [birth year] [id number]   Incoming call from patient's son, [name], on behalf of [name], in response to the [facility] Medication Adherence Campaign. Note Mr. Sutherlin is listed in chart under patient's [employer]. HIPAA identifiers verified.  Mr. Tallmadge reports that he is his mother's caregiver and that recently he has been having increasing difficulty with caring for her. States "it's gotten really bad". States that the patient is also having trouble sleeping, which makes it hard for him to sleep as well. Also reports that he does not drive anymore himself either. Mr. Norfleet expresses concern about his ability to continue to take care of the patient or his ability to afford to have additional care for her.  Encourage patient to call his PCP to let her know about what is going on with the patient. Mr. Foot expresses hesitance, but agrees and states that he will call now as soon as we hang up. Also let patient's son know about services of [facility] Care Management Social Work team that are available to the patient. Mr. Mcgloin states that they "have been down that road before", referring to speaking with a social worker, and declines to have me send a referral to Child psychotherapist.   Mr. Esser declines to speak further, but does take my phone number. Offer to call to follow up with patient and son later, but Mr. Korte declines.  Perform chart review to confirm that patient contacts patient's PCP office today and schedules an appointment for the patient with Dr. Swaziland to "discuss options for pt due to dementia" for next week.  PLAN  1) Will route my note to patient's PCP  2) Will close pharmacy episode at this time.  Duanne Moron, PharmD, [facility] Clinical Pharmacist [facility] Care Management [phone]

## 2018-10-26 ENCOUNTER — Telehealth: Payer: Self-pay | Admitting: *Deleted

## 2018-10-26 NOTE — Telephone Encounter (Signed)
Copied from CRM [id number]. Topic: General - Inquiry >> Oct 26, 2018  8:53 AM Wyonia Hough E wrote: Reason for CRM: Pt [name]) called and stated that his mother refuses to come in and go to any of her appts with any of her doctors or providers. He needs some sort of assistance or help with how to get her in to the office for her appt on Wednesday with Dr. Swaziland. Please advise

## 2018-10-26 NOTE — Telephone Encounter (Signed)
Message sent to Dr. Jordan for review. 

## 2018-10-28 ENCOUNTER — Ambulatory Visit: Payer: PPO | Admitting: Family Medicine

## 2018-10-28 NOTE — Telephone Encounter (Signed)
I was hoping to see Ms. Vallee here today, apparently appointment was canceled.  These are some options around town:  [facility] [phone]. [facility], [phone]. [facility] [phone]. [facility] [phone]. [facility], [phone].  Thanks, BJ

## 2018-11-17 ENCOUNTER — Other Ambulatory Visit: Payer: Self-pay | Admitting: Family Medicine

## 2018-11-19 ENCOUNTER — Ambulatory Visit (INDEPENDENT_AMBULATORY_CARE_PROVIDER_SITE_OTHER): Payer: PPO | Admitting: *Deleted

## 2018-11-19 ENCOUNTER — Other Ambulatory Visit: Payer: Self-pay

## 2018-11-19 DIAGNOSIS — I480 Paroxysmal atrial fibrillation: Secondary | ICD-10-CM

## 2018-11-19 DIAGNOSIS — R001 Bradycardia, unspecified: Secondary | ICD-10-CM

## 2018-11-19 LAB — CUP PACEART REMOTE DEVICE CHECK
Battery Remaining Percentage: 85 %
Brady Statistic RA Percent Paced: 45 %
Brady Statistic RV Percent Paced: 0 %
Date Time Interrogation Session: 20200319041200
Implantable Lead Implant Date: 20130128
Implantable Lead Implant Date: 20130128
Implantable Lead Location: 753859
Implantable Lead Location: 753860
Implantable Lead Model: 4136
Implantable Lead Serial Number: 29117362
Implantable Lead Serial Number: 29118103
Implantable Pulse Generator Implant Date: 20130128
Lead Channel Impedance Value: 512 Ohm
Lead Channel Pacing Threshold Amplitude: 0.5 V
Lead Channel Pacing Threshold Pulse Width: 0.4 ms
Lead Channel Setting Pacing Amplitude: 2 V
Lead Channel Setting Pacing Amplitude: 2.4 V
Lead Channel Setting Pacing Pulse Width: 0.4 ms
MDC IDC MSMT BATTERY REMAINING LONGEVITY: 54 mo
MDC IDC MSMT LEADCHNL RV IMPEDANCE VALUE: 774 Ohm
MDC IDC SET LEADCHNL RV SENSING SENSITIVITY: 2.5 mV
Pulse Gen Serial Number: 123684

## 2018-11-26 NOTE — Progress Notes (Signed)
Remote pacemaker transmission.   

## 2018-12-07 ENCOUNTER — Other Ambulatory Visit: Payer: Self-pay

## 2018-12-07 NOTE — Patient Outreach (Signed)
[facility]) Care Management  12/07/2018  [name] [birth year] [id number]   Telephone Screen  Referral Date: 12/07/2018 Referral Source: Nurse Call Center Referral Reason: "12/04/2018-3:06pm-caller states his mother has had diarrhea twice within last few hours and has given Pepto Bismol" Insurance: [employer]   Outreach attempt #1 to patient. Spoke with patient's son-[name](DPR I file). He voices that patient is doing better. He states that diarrhea has resolved and patient has had no further episodes over the weekend. He feels that maybe it was something that patient ate. He denies any further RN CM needs or concerns at this time. He has info for web MD to do virtual visit during this time if needed. He is also aware that he has can call 24hr Nurse Line for any future needs or concerns. He voiced understanding and was appreciative of follow up call.       Plan: RN CM will close case as no further interventions needed at this time.   Antionette Fairy, RN,BSN,CCM [facility] Care Management Telephonic Care Management Coordinator Direct Phone: [phone] Toll Free: [phone] Fax: [phone]

## 2018-12-10 ENCOUNTER — Other Ambulatory Visit: Payer: Self-pay | Admitting: Family Medicine

## 2018-12-14 ENCOUNTER — Other Ambulatory Visit: Payer: Self-pay | Admitting: Family Medicine

## 2018-12-15 ENCOUNTER — Other Ambulatory Visit: Payer: Self-pay | Admitting: Family Medicine

## 2018-12-20 ENCOUNTER — Emergency Department (HOSPITAL_COMMUNITY): Payer: PPO

## 2018-12-20 ENCOUNTER — Encounter (HOSPITAL_COMMUNITY): Payer: Self-pay

## 2018-12-20 ENCOUNTER — Emergency Department (HOSPITAL_COMMUNITY)
Admission: EM | Admit: 2018-12-20 | Discharge: 2018-12-21 | Disposition: A | Discharge status: Home / Self Care | Payer: PPO | Attending: Emergency Medicine | Admitting: Emergency Medicine

## 2018-12-20 ENCOUNTER — Other Ambulatory Visit: Payer: Self-pay

## 2018-12-20 DIAGNOSIS — N3000 Acute cystitis without hematuria: Secondary | ICD-10-CM

## 2018-12-20 DIAGNOSIS — Z7984 Long term (current) use of oral hypoglycemic drugs: Secondary | ICD-10-CM | POA: Diagnosis not present

## 2018-12-20 DIAGNOSIS — E1149 Type 2 diabetes mellitus with other diabetic neurological complication: Secondary | ICD-10-CM | POA: Diagnosis not present

## 2018-12-20 DIAGNOSIS — I10 Essential (primary) hypertension: Secondary | ICD-10-CM | POA: Diagnosis not present

## 2018-12-20 DIAGNOSIS — R1032 Left lower quadrant pain: Secondary | ICD-10-CM | POA: Insufficient documentation

## 2018-12-20 DIAGNOSIS — F0391 Unspecified dementia with behavioral disturbance: Secondary | ICD-10-CM | POA: Diagnosis not present

## 2018-12-20 DIAGNOSIS — R109 Unspecified abdominal pain: Secondary | ICD-10-CM | POA: Diagnosis present

## 2018-12-20 DIAGNOSIS — Z79899 Other long term (current) drug therapy: Secondary | ICD-10-CM | POA: Insufficient documentation

## 2018-12-20 LAB — URINALYSIS, ROUTINE W REFLEX MICROSCOPIC
Bacteria, UA: NONE SEEN
Bilirubin Urine: NEGATIVE
Glucose, UA: NEGATIVE mg/dL
Hgb urine dipstick: NEGATIVE
Ketones, ur: NEGATIVE mg/dL
Nitrite: NEGATIVE
Protein, ur: NEGATIVE mg/dL
Specific Gravity, Urine: 1.014 (ref 1.005–1.030)
pH: 5 (ref 5.0–8.0)

## 2018-12-20 MED ORDER — LISINOPRIL 20 MG PO TABS
20.0000 mg | ORAL_TABLET | Freq: Once | ORAL | Status: AC
  Administered 2018-12-20: 20 mg via ORAL
  Filled 2018-12-20: qty 1

## 2018-12-20 MED ORDER — NITROFURANTOIN MONOHYD MACRO 100 MG PO CAPS: 100.0000 mg | ORAL_CAPSULE | Freq: Two times a day (BID) | ORAL | 0 refills | Status: DC

## 2018-12-20 MED ORDER — NITROFURANTOIN MONOHYD MACRO 100 MG PO CAPS
100.0000 mg | ORAL_CAPSULE | Freq: Once | ORAL | Status: AC
  Administered 2018-12-20: 100 mg via ORAL
  Filled 2018-12-20: qty 1

## 2018-12-20 NOTE — Discharge Instructions (Addendum)
1.  Take antibiotic as prescribed.  Follow-up on your urine culture within the next 1 to 2 days.  You may check this on your MyChart or call your doctor to schedule a follow-up appointment and review your results.  You will need a recheck to make sure that any infection has cleared. 2.  Your blood pressure is elevated.  You have been given an extra dose of lisinopril in the emergency department.  Check your blood pressure 2-3 times daily and write down the numbers.  Continue taking your home lisinopril dose.  Call your doctor with your recorded blood pressure numbers to determine if you need a dose increase.

## 2018-12-20 NOTE — ED Triage Notes (Signed)
Pt son reports that patient was complaining of lower left abdominal pain that started about 3 hours ago. Pt is demented and a poor historian. Son states that they have had no sick contacts and have not left the house for 3 weeks. Son at bedside d/t mother's mental status. Pt refusing to change in to a gown at this time.

## 2018-12-20 NOTE — ED Notes (Signed)
Patient transported to X-ray 

## 2018-12-20 NOTE — ED Provider Notes (Signed)
[facility]-EMERGENCY DEPT Provider Note   CSN: [id number] Arrival date & time: 12/20/18  2103    History   Chief Complaint Chief Complaint  Patient presents with  . Abdominal Pain    HPI [name] is a 85 y.o. female.     HPI Patient lives home with her son.  He reports that this evening between about 5 and 730 she was complaining of some lower abdominal pain.  He reports she did not seem like she was in a lot of distress or very agitated but was complaining of pain.  He reports when he pushed in the area it felt kind of firm and she endorsed more pain.  He called the nurse call in line and described the symptoms and they advised he should bring her to the emergency department for evaluation.  He reports that since they came, she has had no sign of pain or distress.  She has been eating as per usual leading up to this.  There is not been any vomiting.  He reports that she usually flexes up toilet before he can see if he has had any bowel movement.  No constipation that he is aware of.  He reports that is fairly common for her to complain of chest pain and abdominal pain.  He reports sometimes he thinks is due to anxiety.  He reports she however has had frequent urinary tract infections in the past.  He has not measured any temperature at home.  The patient denies that she is having any pain at this time.  She wants to know when she will get to go home and why they came here in the first place. Past Medical History:  Diagnosis Date  . ANEMIA, DEFICIENCY, HX OF 03/01/2010  . Anxiety   . DEPRESSIVE DISORDER 09/19/2010  . DM w/o Complication Type II 05/18/2007  . GERD 05/18/2007  . Glaucoma   . HYPERLIPIDEMIA 03/17/2008  . HYPERTENSION 05/18/2007  . INSOMNIA 11/23/2008  . Kidney stone   . Loss of weight 05/30/2010    Patient Active Problem List   Diagnosis Date Noted  . Osteoarthritis of both knees 07/15/2018  . High risk medication use 06/02/2018  . Syncope  05/27/2018  . 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 UTI (urinary tract infection) 08/19/2017  . Dementia with behavioral disturbance (HCC) 08/19/2017  . Anxiety 10/27/2012  . Symptomatic bradycardia 10/01/2011  . Status post cardiac pacemaker procedure 10/01/2011  . Atrial fibrillation (HCC) 09/29/2011  . Pain disorder 04/19/2011  . Chest pain, atypical 02/20/2011  . Depression, major 09/19/2010  . LOSS OF WEIGHT 05/30/2010  . ANEMIA, DEFICIENCY, HX OF 03/01/2010  . INSOMNIA 11/23/2008  . Dyslipidemia 03/17/2008  . 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 Essential hypertension 05/18/2007  . GERD 05/18/2007    Past Surgical History:  Procedure Laterality Date  . ABDOMINAL HYSTERECTOMY    . APPENDECTOMY    . CATARACT EXTRACTION    . COLONOSCOPY  06-28-10   per Dr. Arlyce Dice, diverticulosis only   . DILATION AND CURETTAGE OF UTERUS    . ESOPHAGOGASTRODUODENOSCOPY  05-13-11   per Dr. Arlyce Dice, normal   . HEMORRHOID SURGERY    . PACEMAKER INSERTION  09/2011  . PERMANENT PACEMAKER INSERTION N/A 09/30/2011   Procedure: PERMANENT PACEMAKER INSERTION;  Surgeon: Marinus Maw, MD;  Location: [facility] CATH LAB;  Service: Cardiovascular;  Laterality: N/A;     OB History  Gravida      Para      Term      Preterm      AB      Living  3     SAB      TAB      Ectopic      Multiple      Live Births               Home Medications    Prior to Admission medications   Medication Sig Start Date End Date Taking? Authorizing Provider  acetaminophen (TYLENOL) 500 MG tablet Take 1,000 mg by mouth every 6 (six) hours as needed for moderate pain (pain). For pain    [provider]  diclofenac sodium (VOLTAREN) 1 % GEL Apply 4 g topically 4 (four) times daily. 07/15/18   Sw[place]ilandJordan, Betty G, MD  divalproex (DEPAKOTE ER) 250 MG 24 hr tablet Take 1 tablet every night 07/03/18   Van ClinesAquino, Karen M, MD   famotidine (PEPCID) 20 MG tablet TAKE 1 TABLET BY MOUTH TWICE DAILY. 12/16/18   [place]ilandJordan, Betty G, MD  lisinopril (PRINIVIL,ZESTRIL) 10 MG tablet Take 1 tablet (10 mg total) by mouth daily. 09/23/18   [place]andJordan, Betty G, MD  LORazepam (ATIVAN) 0.5 MG tablet TAKE 1/2 TO 1 TABLET ONCE A DAY AS NEEDED FOR ANXIETY. 12/15/18   [place]ilandJordan, Betty G, MD  metFORMIN (GLUCOPHAGE) 500 MG tablet Take 1 tablet (500 mg total) by mouth daily with breakfast. 07/15/18   [place]ilandJordan, Betty G, MD  Multiple Vitamin (MULTIVITAMIN WITH MINERALS) TABS tablet Take 1 tablet by mouth daily. 09/04/17   Gordy SaversKwiatkowski, Peter F, MD  mupirocin ointment (BACTROBAN) 2 % Apply 1 application topically 2 (two) times daily. 06/05/18   [place]andJordan, Betty G, MD  nitrofurantoin, macrocrystal-monohydrate, (MACROBID) 100 MG capsule Take 1 capsule (100 mg total) by mouth 2 (two) times daily. 12/20/18   Arby BarrettePfeiffer, Drako Maese, MD  QUEtiapine (SEROQUEL) 50 MG tablet Half tablet daily at night for a week then half tablet every other day and then discontinue it. 07/15/18 07/29/18  [place]andJordan, Betty G, MD  sertraline (ZOLOFT) 50 MG tablet TAKE 1 TABLET ONCE DAILY. 05/26/18   Roderick Peeodd, Jeffrey A, MD  simvastatin (ZOCOR) 40 MG tablet TAKE 1 TABLET ONCE DAILY AT 6 PM. 12/15/18   [place]ilandJordan, Betty G, MD    Family History Family History  Problem Relation Age of Onset  . Unexplained death Mother   . Brain cancer Father        brain tumor  . Colon cancer Sister   . Colitis Brother   . Heart attack Brother     Social History Social History   Tobacco Use  . Smoking status: Never Smoker  . Smokeless tobacco: Never Used  Substance Use Topics  . Alcohol use: No  . Drug use: No     Allergies   Amoxicillin and Penicillins   Review of Systems Review of Systems 10 Systems reviewed and are negative for acute change except as noted in the HPI.   Physical Exam Updated Vital Signs BP (!) 214/82   Pulse 65   Temp 98.2 F (36.8 C) (Oral)   Resp 15   SpO2 100%   Physical Exam  Constitutional:      Comments: Patient is alert and nontoxic.  No distress.  Well-nourished well-developed excellent condition for age.  HENT:     Head: Normocephalic and atraumatic.     Mouth/Throat:     Mouth: Mucous membranes are moist.  Pharynx: Oropharynx is clear.  Eyes:     Extraocular Movements: Extraocular movements intact.  Cardiovascular:     Rate and Rhythm: Normal rate and regular rhythm.  Pulmonary:     Effort: Pulmonary effort is normal.     Breath sounds: Normal breath sounds.  Abdominal:     General: There is no distension.     Palpations: Abdomen is soft.     Tenderness: There is no abdominal tenderness. There is no guarding.  Musculoskeletal: Normal range of motion.        General: No swelling.     Right lower leg: No edema.     Left lower leg: No edema.  Skin:    General: Skin is warm and dry.  Neurological:     Comments: Patient is very alert and conversant.  She is very hard of hearing.  All of her verbal interactions are situationally appropriate.  When she can hear properly, she follows commands.  No focal motor deficits.  Psychiatric:        Mood and Affect: Mood normal.      ED Treatments / Results  Labs (all labs ordered are listed, but only abnormal results are displayed) Labs Reviewed  URINALYSIS, ROUTINE W REFLEX MICROSCOPIC - Abnormal; Notable for the following components:      Result Value   Leukocytes,Ua TRACE (*)    All other components within normal limits  URINE CULTURE    EKG None  Radiology Dg Abd Acute W/chest  Result Date: 12/20/2018 CLINICAL DATA:  Left lower abdominal pain EXAM: DG ABDOMEN ACUTE W/ 1V CHEST COMPARISON:  08/24/2018 FINDINGS: Left pacer remains in place, unchanged. Mild cardiomegaly. Lungs clear. No effusions. Calcified phleboliths in the pelvis. Nonobstructive bowel gas pattern. No organomegaly or free air. No acute bony abnormality. IMPRESSION: No acute cardiopulmonary disease. No evidence of bowel  obstruction or free air. Electronically Signed   By: Charlett Nose M.D.   On: 12/20/2018 22:42    Procedures Procedures (including critical care time)  Medications Ordered in ED Medications  nitrofurantoin (macrocrystal-monohydrate) (MACROBID) capsule 100 mg (100 mg Oral Given 12/20/18 2354)  lisinopril (ZESTRIL) tablet 20 mg (20 mg Oral Given 12/20/18 2351)     Initial Impression / Assessment and Plan / ED Course  I have reviewed the triage vital signs and the nursing notes.  Pertinent labs & imaging results that were available during my care of the patient were reviewed by me and considered in my medical decision making (see chart for details).       Patient is alert and nontoxic.  She has no objective pain at this time.  Her abdominal examination is completely soft.  Even with deep palpation patient does not express discomfort.  She has had UTI in the past.  Urine is trace positive.  Will treat empirically.  Culture pending.  Have reviewed return precautions and follow-up plan with the patient's son.  Final Clinical Impressions(s) / ED Diagnoses   Final diagnoses:  Left lower quadrant abdominal pain  Acute cystitis without hematuria    ED Discharge Orders         Ordered    nitrofurantoin, macrocrystal-monohydrate, (MACROBID) 100 MG capsule  2 times daily     12/20/18 2352           Arby Barrette, MD 12/20/18 2358

## 2018-12-21 ENCOUNTER — Other Ambulatory Visit: Payer: Self-pay

## 2018-12-21 NOTE — Patient Outreach (Signed)
[facility]) Care Management  12/21/2018  [name] February 12, 1923 [id number]   Telephone Screen  Referral Date: 12/21/2018 Referral Source: Nurse Call Center Referral Reason: " 12/20/2018-6:30pm-caller states his mom has temp of 99.1-gave Tyylenol, c/o stomach pain for last 2-3 hrs-gave Pepto Bismol, wants to know what else to do for his mom" Insurance:HTA   Outreach attempt # 1 to patient. Spoke with son-richard(DPR on file). Son voices that he feels patient is doing a little better today. He states that he took her temp a short while ago and it was 98.3. He reports that he ended up taking him mom to the ED on yesterday to be evaluated. He voices that she was diagnosed with UTI and will start taking antibiotic once med is ready at pharmacy. RN CM educated caregiver on some nonpharmacologic measures to help with treating bladder infection. He voices understanding. He states that patient has had no further abd. issues. He is aware to seek medical attention for any worsening and/or unresolved issues. He denies any further RN CM needs or concerns at this time. Son is aware that he can call 24 hr Nurse Line at any time for any questions or concerns regarding patient.      Plan: RN CM will close case as no further interventions needed at this time.    Antionette Fairy, RN,BSN,CCM [facility] Care Management Telephonic Care Management Coordinator Direct Phone: [phone] Toll Free: [phone] Fax: [phone]

## 2018-12-22 ENCOUNTER — Other Ambulatory Visit: Payer: Self-pay | Admitting: *Deleted

## 2018-12-22 ENCOUNTER — Telehealth: Payer: Self-pay | Admitting: Family Medicine

## 2018-12-22 LAB — URINE CULTURE: Culture: NO GROWTH

## 2018-12-22 MED ORDER — NITROFURANTOIN MONOHYD MACRO 100 MG PO CAPS: 100.0000 mg | ORAL_CAPSULE | Freq: Two times a day (BID) | ORAL | 0 refills | Status: DC

## 2018-12-22 NOTE — Telephone Encounter (Signed)
Copied from CRM [id number]. Topic: Quick Communication - Rx Refill/Question >> Dec 22, 2018  9:28 AM Dalphine Handing A wrote: Medication: nitrofurantoin, macrocrystal-monohydrate, (MACROBID) 100 MG capsule (Patient stated that medication was sent to the wrong pharmacy.)  Has the patient contacted their pharmacy? Yes (Agent: If no, request that the patient contact the pharmacy for the refill.) (Agent: If yes, when and what did the pharmacy advise?) Contact PCP  Preferred Pharmacy (with phone number or street name): [facility] - [place], [place] - [address] [phone] (Phone) [phone] (Fax)    Agent: Please be advised that RX refills may take up to 3 business days. We ask that you follow-up with your pharmacy.

## 2018-12-22 NOTE — Telephone Encounter (Signed)
Rx resent to the correct pharmacy per Dr. Swaziland.

## 2018-12-22 NOTE — Telephone Encounter (Signed)
Rx can be called to her pharmacy with same instructions and prescriber's name. Thanks, BJ

## 2018-12-22 NOTE — Telephone Encounter (Signed)
Patient seen at [facility] ER on 12/20/2018, medication was sent to the wrong pharmacy. Is it okay to resend medication to correct pharmacy.

## 2019-01-12 ENCOUNTER — Other Ambulatory Visit: Payer: Self-pay | Admitting: Family Medicine

## 2019-01-15 ENCOUNTER — Ambulatory Visit (INDEPENDENT_AMBULATORY_CARE_PROVIDER_SITE_OTHER): Payer: PPO | Admitting: Family Medicine

## 2019-01-15 ENCOUNTER — Encounter: Payer: Self-pay | Admitting: Family Medicine

## 2019-01-15 ENCOUNTER — Other Ambulatory Visit: Payer: Self-pay

## 2019-01-15 DIAGNOSIS — E1149 Type 2 diabetes mellitus with other diabetic neurological complication: Secondary | ICD-10-CM

## 2019-01-15 DIAGNOSIS — F419 Anxiety disorder, unspecified: Secondary | ICD-10-CM | POA: Diagnosis not present

## 2019-01-15 DIAGNOSIS — I10 Essential (primary) hypertension: Secondary | ICD-10-CM | POA: Diagnosis not present

## 2019-01-15 DIAGNOSIS — F0151 Vascular dementia with behavioral disturbance: Secondary | ICD-10-CM | POA: Diagnosis not present

## 2019-01-15 DIAGNOSIS — F01518 Vascular dementia, unspecified severity, with other behavioral disturbance: Secondary | ICD-10-CM

## 2019-01-15 DIAGNOSIS — G4700 Insomnia, unspecified: Secondary | ICD-10-CM | POA: Diagnosis not present

## 2019-01-15 NOTE — Telephone Encounter (Signed)
Virtual visit scheduled for 2:30 pm on today, 01/15/2019.

## 2019-01-15 NOTE — Assessment & Plan Note (Signed)
Continue lisinopril 10 mg daily. Low salt diet also recommended. If possible recommend monitoring BP regularly.

## 2019-01-15 NOTE — Assessment & Plan Note (Signed)
She has not been taking metformin 500 mg for a few months now. Her son does not feel comfortable doing fingersticks. Recommend trying to decrease the amount of cookies she is eating. Appropriate foot care. Follow-up in 3 to 4 months.

## 2019-01-15 NOTE — Assessment & Plan Note (Addendum)
Problem seems to be stable. She is not taking Depakote. Continue following with neurologist.

## 2019-01-15 NOTE — Telephone Encounter (Signed)
Can a virtual visit be arranged. Last OV 07/2018 and 5 moths f/u was recommended. Thanks, BJ

## 2019-01-15 NOTE — Progress Notes (Signed)
Virtual Visit via Telephone Note  I connected with [name] and her son on 01/16/19 at  2:30 PM EDT by telephone and verified that I am speaking with the correct person using two identifiers. Her son provides history.   I discussed the limitations, risks, security and privacy concerns of performing an evaluation and management service by telephone and the availability of in person appointments. I also discussed with the patient that there may be a patient responsible charge related to this service. Her son expressed understanding and agreed to proceed.  Location patient: home Location provider: work Participants present for the call: patient, provider Patient did not have a visit in the prior 7 days to address this/these issue(s).   History of Present Illness: [name] is a 85 yo female with Hx of dementia,DM II,HTN,anxiety,and HLD among some. Last f/u visit on 07/15/18. Since her last visit she has been in the ER c/o LLQ abdominal pain. Dx with UTI,completed abx treatment and currently asymptomatic.  Anxiety and insomnia: She is on Seroquel 50 mg,Zolof 50 mg daily, and taking Ativan 0.5 mg daily as needed. Her son has not noted depressed mood. Anxiety seems to be stable.  Lorazepam at bedtime helps to calm her down. Sleeping well.  He does not recognized him most of the time and keeps asking to go home. She sees Dr Laray AngerAquino,lastvisit 07/2018. She is supposed to be on Depakote ER 250 mg daily,not taking it.  No side effects from medication.  DM II,she is not taking Metformin 500 mg daily. His son gives her cookies in order to keep her calm during the day. He is not checking her BS's,he does not think she can do it.  Negative for abdominal pain, nausea,vomiting, polydipsia,polyuria, or polyphagia.  Lab Results  Component Value Date   HGBA1C 6.0 07/15/2018   HTN,sher is on Lisinopril 10 mg daily. Son is not checking BP. She has not complained of headache,chest  pain,dyspnea,or edema.    Lab Results  Component Value Date   CREATININE 1.05 (H) 08/24/2018   BUN 20 08/24/2018   NA 140 08/24/2018   K 5.1 08/24/2018   CL 108 08/24/2018   CO2 24 08/24/2018    Observations/Objective: Son is on the phone.Ms [name] is present and does not seem to be agitated. Patient reported vitals:N/A  Assessment and Plan:  Type 2 diabetes mellitus with neurological complications (HCC) She has not been taking metformin 500 mg for a few months now. Her son does not feel comfortable doing fingersticks. Recommend trying to decrease the amount of cookies she is eating. Appropriate foot care. Follow-up in 3 to 4 months.  Essential hypertension Continue lisinopril 10 mg daily. Low salt diet also recommended. If possible recommend monitoring BP regularly.  INSOMNIA Problem is well controlled with Seroquel 50 mg at bedtime. No changes in current management. Good sleep hygiene.  Anxiety Problem is stable. Continue Zoloft 50 mg daily and lorazepam 0.5 mg daily as needed. Some side effects discussed.    Dementia with behavioral disturbance Problem seems to be stable. She is not taking Depakote. Continue following with neurologist.    Follow Up Instructions:  His son does not feel comfortable bringing her here for lab work. F/U in 3-4 months,will plan on having labs done. No changes in medications.   I did not refer this patient for an OV in the next 24 hours for this/these issue(s).  I discussed the assessment and treatment plan with the patient. His was provided an opportunity  to ask questions and all were answered. The patient agreed with the plan and demonstrated an understanding of the instructions.   The patient was advised to call back or seek an in-person evaluation if the symptoms worsen or if the condition fails to improve as anticipated.  I provided 21 minutes of non-face-to-face time during this encounter.   Manika Hast [place], MD

## 2019-01-15 NOTE — Assessment & Plan Note (Addendum)
Problem is stable. Continue Zoloft 50 mg daily and lorazepam 0.5 mg daily as needed. Some side effects discussed.

## 2019-01-15 NOTE — Assessment & Plan Note (Signed)
Problem is well controlled with Seroquel 50 mg at bedtime. No changes in current management. Good sleep hygiene.

## 2019-02-12 ENCOUNTER — Other Ambulatory Visit: Payer: Self-pay

## 2019-02-15 ENCOUNTER — Telehealth (INDEPENDENT_AMBULATORY_CARE_PROVIDER_SITE_OTHER): Payer: PPO | Admitting: Neurology

## 2019-02-15 ENCOUNTER — Encounter: Payer: Self-pay | Admitting: Neurology

## 2019-02-15 ENCOUNTER — Other Ambulatory Visit: Payer: Self-pay

## 2019-02-15 VITALS — Ht 64.0 in | Wt 145.0 lb

## 2019-02-15 DIAGNOSIS — F0391 Unspecified dementia with behavioral disturbance: Secondary | ICD-10-CM

## 2019-02-15 DIAGNOSIS — F03B18 Unspecified dementia, moderate, with other behavioral disturbance: Secondary | ICD-10-CM

## 2019-02-15 NOTE — Progress Notes (Signed)
Virtual Visit via Telephone Note The purpose of this virtual visit is to provide medical care while limiting exposure to the novel coronavirus.    Consent was obtained for phone visit:  Yes.   Answered questions that patient had about telehealth interaction:  Yes.   I discussed the limitations, risks, security and privacy concerns of performing an evaluation and management service by telephone. I also discussed with the patient that there may be a patient responsible charge related to this service. The patient expressed understanding and agreed to proceed.  Pt location: Home Physician Location: office Name of referring provider:  Martinique, Betty G, MD I connected with .[name] at patient's [name] initiation/request on 02/15/2019 at  2:00 PM EDT by telephone and verified that I am speaking with the correct person using two identifiers.  Pt MRN:  [id number] Pt DOB:  02-07-1923   History of Present Illness:  The patient was last seen in November 2019 for moderate dementia with behavioral disturbance. Her son provides the history on the phone as patient is unable to answer questions and is hard of hearing. On her initial visit, her son was asking for medication to "quiet her down a little more." [name] is at his wit's end with caregiver fatigue. He states she does not know who her son is, she wants to go home and gets very agitated. This occurs on a daily basis and "is like a storm, dies down and clams down some but not totally." She was prescribed low dose Depakote on initial visit, he stopped it because it did not seem to do any good, he does not recall any side effects. She is on Zoloft 50mg  BID, Ativan 0.5mg  daily,  and Seroquel 50mg  qhs. The Ativan seems to help sometimes. He feels the Seroquel does more good than anything else. She sleeps pretty good most nights, at times waking him up at 3am. She has difficulty comprehending what people say, both due to dementia and  hearing loss. She has hallucinations thinking other people are in the house, saying children or her sisters are there. She got up this morning asking for her mother. She has not tried to wander out of the house, she wants to go out but [name] keeps the door locked. [name] states he has medical issues as well and they used to have an aide bringing them to appointments and helping with meals, but this stopped with the pandemic restrictions. Her son manages finances, medications, meals. She does not drive. She needs assistance with dressing and bathing.   History on Initial Assessment 07/03/2018: This is a 85 year old right-handed woman with a history of hypertension, hyperlipidemia, diabetes, atrial fibrillation s/p PPM, depression/anxiety, dementia, presenting for evaluation of dementia. Her son states he requested for this appointment "looking for something that will quiet her down a little more." She has a history of vascular dementia. She lives with her son who provides majority of history. She is hard of hearing, sometimes answering questions tangentially but may be due to hearing loss. She states her memory is average. She states "I'm not 62!" She states she takes her medications but her son has been managing them since he moved in with her in 2012. He started noticing changes around that time and he retired to take care of her. He initially noticed she was becoming disoriented, but now is forgetful all the time. She thinks her son is her husband who passed away in 28. Her son manages  finances. She stopped driving in [id number]. She is able to dress herself but has not taken a bath in 4.5 years. She is afraid to get in the shower, taking a sponge bath every 1-2 days. Her son has noticed behavioral changes dramatically worse over the past year. He feels she was on too much medication, she had decreased responsiveness in September 2019 and doses of Zoloft, lorazepam, and Seroquel were reduced. Hereports that with  decrease in dose of lorazepam and Seroquel, she is sleeping better and has less crying spells. She has paranoia, hiding things. He bought her underwear 3-4 months ago that he cannot find. Sometimes she says someone is in her bedroom or that her other son is home. Sometimes she gets angry really quick, cursing, and has been physical a time or two.  She has back pain. She denies any headaches, dizziness, diplopia, dysarthria/dysphagia, neck pain, focal numbness/tingling/weakness, bowel/bladder dysfunction, anosmia, or tremors. Sleep is good. Her 2 sisters had dementia. No history of significant head injuries or alcohol use.   I personally reviewed head CT without contrast done 05/27/18 which did not show any acute changes. There was moderate diffuse atrophy and chronic microvascular disease.    Observations/Objective:  Limited due to nature of phone call, as well as patient's dementia and hearing loss. The patient is hard of hearing and cannot hear when spoken to on the phone. She is heard talking in the background, no clear dysarthria noted.  Assessment and Plan:   This is a 85 yo RH woman with a history of  hypertension, hyperlipidemia, diabetes, atrial fibrillation s/p PPM, depression/anxiety, moderate dementia with behavioral disturbance. Behavioral changes continue to progress, her son feels the Seroquel helps the most, we discussed increasing dose or another retrial of Depakote but increasing dose if no side effects. He is hesitant due to potential side effects and would like to continue current medications for now. Majority of visit was spent counseling son on behavioral changes and getting more help at home. We discussed caregiver fatigue, he states "I am beyond that." Resources will be provided for him, including [employer] [employer] for [employer], as well as [employer] through the [employer]. Continue 24/7 care. Follow-up in 6 months, he knows to call for any changes.     Follow Up Instructions:   -I discussed the assessment and treatment plan with the patient's son. The patient's son was provided an opportunity to ask questions and all were answered. The patient's son agreed with the plan and demonstrated an understanding of the instructions.   The patient's son was advised to call back or seek an in-person evaluation if the symptoms worsen or if the condition fails to improve as anticipated.    Total Time spent in visit with the patient was:  22 minutes, of which 100% of the time was spent in counseling and/or coordinating care on the above.   Pt's son understands and agrees with the plan of care outlined.     Van ClinesKaren M Hadi Dubin, MD

## 2019-02-16 ENCOUNTER — Other Ambulatory Visit: Payer: Self-pay | Admitting: Family Medicine

## 2019-02-18 ENCOUNTER — Ambulatory Visit: Payer: PPO | Admitting: *Deleted

## 2019-02-18 NOTE — Telephone Encounter (Signed)
Pt son Delfino Lovett called to for an update on the refill requests. [name] requests call back. CB# [phone]

## 2019-02-19 ENCOUNTER — Telehealth: Payer: Self-pay

## 2019-02-19 ENCOUNTER — Ambulatory Visit: Payer: PPO | Admitting: Neurology

## 2019-02-19 ENCOUNTER — Encounter

## 2019-02-19 NOTE — Telephone Encounter (Signed)
Last fill 05/26/18 Last OV 01/15/2019  Ok to fill?

## 2019-02-19 NOTE — Telephone Encounter (Signed)
Left message for patient to remind of missed remote transmission.  

## 2019-02-23 LAB — CUP PACEART REMOTE DEVICE CHECK
Battery Remaining Longevity: 54 mo
Battery Remaining Percentage: 82 %
Brady Statistic RA Percent Paced: 45 %
Brady Statistic RV Percent Paced: 0 %
Date Time Interrogation Session: 20200622041100
Implantable Lead Implant Date: 20130128
Implantable Lead Implant Date: 20130128
Implantable Lead Location: 753859
Implantable Lead Location: 753860
Implantable Lead Model: 4135
Implantable Lead Model: 4136
Implantable Lead Serial Number: 29117362
Implantable Lead Serial Number: 29118103
Implantable Pulse Generator Implant Date: 20130128
Lead Channel Impedance Value: 518 Ohm
Lead Channel Impedance Value: 701 Ohm
Lead Channel Pacing Threshold Amplitude: 0.5 V
Lead Channel Pacing Threshold Pulse Width: 0.4 ms
Lead Channel Setting Pacing Amplitude: 2 V
Lead Channel Setting Pacing Amplitude: 2.4 V
Lead Channel Setting Pacing Pulse Width: 0.4 ms
Lead Channel Setting Sensing Sensitivity: 2.5 mV
Pulse Gen Serial Number: 123684

## 2019-03-01 ENCOUNTER — Telehealth: Payer: Self-pay | Admitting: Family Medicine

## 2019-03-01 NOTE — Telephone Encounter (Signed)
Message sent to Dr. Jordan for review and approval. 

## 2019-03-01 NOTE — Telephone Encounter (Signed)
Home Health Verbal Orders - Caller/Agency: [facility] (give orders to: Claiborne Billings) [place] Number: [phone] Claiborne Billings) Requesting OT/PT/Skilled Nursing/Social Work/Speech Therapy: Hospice Care Frequency: n/a  Pt's son reached out to Octavia Bruckner today to ask if Hospice care might be a possibility for his mother. Please advise.

## 2019-03-02 NOTE — Telephone Encounter (Signed)
We have not had this conversation in the office but I think it is appropriate given the advance state of dementia. If son is agreeable please proceed with hospice evaluation. Thanks, BJ

## 2019-03-02 NOTE — Telephone Encounter (Signed)
Caller name: Freddie Breech  Relation to pt: Hospice Outreach Coordinator  Call back number: [phone] contact Claiborne Billings Civil engineer, contracting of Operations)     Reason for call:  Checking on the status of hospice, please advise

## 2019-03-02 NOTE — Telephone Encounter (Signed)
Spoke with Claiborne Billings and gave verbal orders per Dr. Martinique.

## 2019-03-17 ENCOUNTER — Telehealth: Payer: Self-pay

## 2019-03-17 NOTE — Telephone Encounter (Signed)
Alert received for AF/AFlutter in progress. AT/AF burden (one year of data) 5%. No OAC noted. Will route to Dr. Lovena Le.

## 2019-03-21 NOTE — Telephone Encounter (Signed)
Lets see her to discuss anti-coagulation.

## 2019-03-23 ENCOUNTER — Other Ambulatory Visit: Payer: Self-pay | Admitting: *Deleted

## 2019-03-23 MED ORDER — LORAZEPAM 0.5 MG PO TABS: ORAL_TABLET | ORAL | 1 refills | Status: DC

## 2019-04-01 ENCOUNTER — Telehealth: Payer: Self-pay

## 2019-04-01 NOTE — Telephone Encounter (Signed)
Called to do COVID screening...no answer. 

## 2019-04-02 ENCOUNTER — Encounter: Payer: PPO | Admitting: Internal Medicine

## 2019-04-26 ENCOUNTER — Other Ambulatory Visit: Payer: Self-pay | Admitting: Family Medicine

## 2019-04-28 NOTE — Telephone Encounter (Signed)
It seems like she is receiving Lorazepam from a different provider. Because this is controlled medication I am supposed to be the prescribe. She also received a Rx for Fidel Levy can interact with some of her other meds.  Thanks, BJ

## 2019-04-29 ENCOUNTER — Other Ambulatory Visit: Payer: Self-pay | Admitting: Family Medicine

## 2019-04-30 NOTE — Telephone Encounter (Signed)
Pharm called - [facility]  She is asking about this medication.  Is it denied?

## 2019-04-30 NOTE — Telephone Encounter (Signed)
Son was supposed to be asked why she is getting Rx from a different provider before it was refilled. Thanks, BJ

## 2019-05-07 ENCOUNTER — Ambulatory Visit (INDEPENDENT_AMBULATORY_CARE_PROVIDER_SITE_OTHER): Admitting: *Deleted

## 2019-05-07 DIAGNOSIS — I480 Paroxysmal atrial fibrillation: Secondary | ICD-10-CM | POA: Diagnosis not present

## 2019-05-13 LAB — CUP PACEART REMOTE DEVICE CHECK
Battery Remaining Longevity: 48 mo
Battery Remaining Percentage: 78 %
Brady Statistic RA Percent Paced: 47 %
Brady Statistic RV Percent Paced: 0 %
Date Time Interrogation Session: 20200910041100
Implantable Lead Implant Date: 20130128
Implantable Lead Implant Date: 20130128
Implantable Lead Location: 753859
Implantable Lead Location: 753860
Implantable Lead Model: 4135
Implantable Lead Model: 4136
Implantable Lead Serial Number: 29117362
Implantable Lead Serial Number: 29118103
Implantable Pulse Generator Implant Date: 20130128
Lead Channel Impedance Value: 502 Ohm
Lead Channel Impedance Value: 809 Ohm
Lead Channel Pacing Threshold Amplitude: 0.5 V
Lead Channel Pacing Threshold Pulse Width: 0.4 ms
Lead Channel Setting Pacing Amplitude: 2 V
Lead Channel Setting Pacing Amplitude: 2.4 V
Lead Channel Setting Pacing Pulse Width: 0.4 ms
Lead Channel Setting Sensing Sensitivity: 2.5 mV
Pulse Gen Serial Number: 123684

## 2019-05-19 ENCOUNTER — Encounter: Payer: Self-pay | Admitting: Cardiology

## 2019-05-19 NOTE — Progress Notes (Signed)
Remote pacemaker transmission.   

## 2019-05-24 ENCOUNTER — Telehealth: Payer: Self-pay | Admitting: *Deleted

## 2019-05-24 NOTE — Telephone Encounter (Signed)
Spoke with patient's son, Delfino Lovett Dr [facility]) regarding alerts received for AF episodes and HVR episodes detected on 05/21/19. Appears AF episode began 05/21/19 at 18:24, ongoing at time of transmission at 00:12 on 05/22/19. Pt missed appointment with Dr. Lovena Le on 04/02/19, planned to discuss [place] at this visit.   Per [name], pt does not report any cardiac symptoms. She had a fall last night without injury, though EMS had to come out to help her up. Pt's dementia continues to worsen. [name] can no longer drive, so getting pt to appointments is difficult. He is agreeable to a virtual visit (telephone call) with Dr. Lovena Le if possible. Advised I will forward this message to [facility] for assistance scheduling. [name] in agreement with plan.

## 2019-05-28 ENCOUNTER — Telehealth: Payer: Self-pay | Admitting: Internal Medicine

## 2019-05-28 NOTE — Telephone Encounter (Signed)
New Message  Patient's husband states that he is returning a call from yesterday 05/27/19, no notes left to say what the call was for and no voicemail left. If patient was called, please give a call back.

## 2019-05-28 NOTE — Telephone Encounter (Signed)
The only call I see is from 9/21 from device clinic. I will route to device as they may have tried to reach out to the pt.

## 2019-05-28 NOTE — Telephone Encounter (Signed)
Spoke with husband. No questions . Just thought call received from our office. Confirmed that he is aware of 06/09/2019 appointment that his wife has with Dr Lovena Le.

## 2019-06-02 NOTE — Telephone Encounter (Signed)
Patient is scheduled to see Dr. Lovena Le via virtual visit on 06/09/19, scheduled for no-charge remote transmission on 06/07/19.

## 2019-06-07 ENCOUNTER — Ambulatory Visit (INDEPENDENT_AMBULATORY_CARE_PROVIDER_SITE_OTHER): Payer: PPO | Admitting: *Deleted

## 2019-06-07 DIAGNOSIS — R55 Syncope and collapse: Secondary | ICD-10-CM

## 2019-06-07 DIAGNOSIS — I480 Paroxysmal atrial fibrillation: Secondary | ICD-10-CM

## 2019-06-09 ENCOUNTER — Telehealth: Payer: PPO | Admitting: Internal Medicine

## 2019-06-09 ENCOUNTER — Other Ambulatory Visit: Payer: Self-pay

## 2019-06-10 LAB — CUP PACEART REMOTE DEVICE CHECK
Battery Remaining Longevity: 48 mo
Battery Remaining Percentage: 74 %
Brady Statistic RA Percent Paced: 46 %
Brady Statistic RV Percent Paced: 0 %
Date Time Interrogation Session: 20201005041100
Implantable Lead Implant Date: 20130128
Implantable Lead Implant Date: 20130128
Implantable Lead Location: 753859
Implantable Lead Location: 753860
Implantable Lead Model: 4135
Implantable Lead Model: 4136
Implantable Lead Serial Number: 29117362
Implantable Lead Serial Number: 29118103
Implantable Pulse Generator Implant Date: 20130128
Lead Channel Impedance Value: 492 Ohm
Lead Channel Impedance Value: 669 Ohm
Lead Channel Pacing Threshold Amplitude: 0.5 V
Lead Channel Pacing Threshold Pulse Width: 0.4 ms
Lead Channel Setting Pacing Amplitude: 2 V
Lead Channel Setting Pacing Amplitude: 2.4 V
Lead Channel Setting Pacing Pulse Width: 0.4 ms
Lead Channel Setting Sensing Sensitivity: 2.5 mV
Pulse Gen Serial Number: 123684

## 2019-06-13 ENCOUNTER — Other Ambulatory Visit: Payer: Self-pay | Admitting: Family Medicine

## 2019-06-14 NOTE — Addendum Note (Signed)
Addended by: Douglass Rivers D on: 06/14/2019 10:07 AM   Modules accepted: Level of Service

## 2019-06-14 NOTE — Progress Notes (Signed)
Remote pacemaker transmission.   

## 2019-07-06 ENCOUNTER — Other Ambulatory Visit: Payer: Self-pay | Admitting: Family Medicine

## 2019-07-06 NOTE — Telephone Encounter (Signed)
Last filled 06/14/2019 Last OV 01/15/2019  Ok to fill?

## 2019-07-07 ENCOUNTER — Other Ambulatory Visit: Payer: Self-pay | Admitting: Family Medicine

## 2019-07-09 NOTE — Telephone Encounter (Signed)
Last filled

## 2019-07-12 ENCOUNTER — Other Ambulatory Visit: Payer: Self-pay | Admitting: Family Medicine

## 2019-08-06 ENCOUNTER — Ambulatory Visit (INDEPENDENT_AMBULATORY_CARE_PROVIDER_SITE_OTHER): Admitting: *Deleted

## 2019-08-06 DIAGNOSIS — R001 Bradycardia, unspecified: Secondary | ICD-10-CM

## 2019-08-07 LAB — CUP PACEART REMOTE DEVICE CHECK
Battery Remaining Longevity: 36 mo
Battery Remaining Percentage: 62 %
Brady Statistic RA Percent Paced: 44 %
Brady Statistic RV Percent Paced: 1 %
Date Time Interrogation Session: 20201204001200
Implantable Lead Implant Date: 20130128
Implantable Lead Implant Date: 20130128
Implantable Lead Location: 753859
Implantable Lead Location: 753860
Implantable Lead Model: 4135
Implantable Lead Model: 4136
Implantable Lead Serial Number: 29117362
Implantable Lead Serial Number: 29118103
Implantable Pulse Generator Implant Date: 20130128
Lead Channel Impedance Value: 468 Ohm
Lead Channel Impedance Value: 638 Ohm
Lead Channel Pacing Threshold Amplitude: 0.5 V
Lead Channel Pacing Threshold Pulse Width: 0.4 ms
Lead Channel Setting Pacing Amplitude: 2 V
Lead Channel Setting Pacing Amplitude: 2.4 V
Lead Channel Setting Pacing Pulse Width: 0.4 ms
Lead Channel Setting Sensing Sensitivity: 2.5 mV
Pulse Gen Serial Number: 123684

## 2019-08-09 ENCOUNTER — Telehealth: Payer: PPO | Admitting: Internal Medicine

## 2019-08-09 ENCOUNTER — Other Ambulatory Visit: Payer: Self-pay

## 2019-08-18 ENCOUNTER — Telehealth: Payer: Self-pay | Admitting: Internal Medicine

## 2019-08-18 NOTE — Telephone Encounter (Signed)
April, the patients hospice nurse, is calling to see if her appt with [name] on 12/18 can be virtual.

## 2019-08-19 NOTE — Progress Notes (Deleted)
Cardiology Office Note Date:  08/19/2019  Patient ID:  [name], [name] 1922/11/07, MRN [id number] PCP:  No primary care provider on file.  Cardiologist:  ***  ***refresh   Chief Complaint: *** new AFib noted via PPM  History of Present Illness: [name] is a 85 y.o. female with history of advanced dementia, HTN, HLD, DM   I reviewed neurology note dated 02/15/2019, some excerpts noted below "Her son provides the history on the phone as patient is unable to answer questions and is hard of hearing. On her initial visit, her son was asking for medication to "quiet her down a little more." Delfino Lovett is at his wit's end with caregiver fatigue. He states she does not know who her son is, she wants to go home and gets very agitated"   "She has difficulty comprehending what people say, both due to dementia and hearing loss. She has hallucinations thinking other people are in the house, saying children or her sisters are there. She got up this morning asking for her mother. She has not tried to wander out of the house, she wants to go out but Delfino Lovett keeps the door locked"  She has had a number of ER visist over the last year or more with AMS, lethargy,  various complaints, CP, abd pain, etc  Her visit today is for Dr. Lovena Le.  He last saw her 2017.  At that time he appreciated a marked worsening of her dementia.  Her BP elevated though given advanced age not planned to treat aggressively.  Phone notes back in Sept report AF episode w/RVR, her son reported no cardiac symptoms being voiced, her son unable to drive and office visits being very difficult to get to and planned to have a tele-health visit with Dr. Lovena Le to discuss a/c.  They missed the appointment it seems.    A hospice nurse called and requested that her appt be changed to virtual.  *** ambulatory? *** advanced dimentia  *** falls *** CHA2DS2Vasc is 5 *** hospice?   Past Medical History:  Diagnosis Date  .  ANEMIA, DEFICIENCY, HX OF 03/01/2010  . Anxiety   . DEPRESSIVE DISORDER 09/19/2010  . DM w/o Complication Type II 7/82/9562  . GERD 05/18/2007  . Glaucoma   . HYPERLIPIDEMIA 03/17/2008  . HYPERTENSION 05/18/2007  . INSOMNIA 11/23/2008  . Kidney stone   . Loss of weight 05/30/2010    Past Surgical History:  Procedure Laterality Date  . ABDOMINAL HYSTERECTOMY    . APPENDECTOMY    . CATARACT EXTRACTION    . COLONOSCOPY  06-28-10   per Dr. Deatra Ina, diverticulosis only   . DILATION AND CURETTAGE OF UTERUS    . ESOPHAGOGASTRODUODENOSCOPY  05-13-11   per Dr. Deatra Ina, normal   . HEMORRHOID SURGERY    . PACEMAKER INSERTION  09/2011  . PERMANENT PACEMAKER INSERTION N/A 09/30/2011   Procedure: PERMANENT PACEMAKER INSERTION;  Surgeon: Evans Lance, MD;  Location: [facility] CATH LAB;  Service: Cardiovascular;  Laterality: N/A;    Current Outpatient Medications  Medication Sig Dispense Refill  . acetaminophen (TYLENOL) 500 MG tablet Take 1,000 mg by mouth every 6 (six) hours as needed for moderate pain (pain). For pain    . famotidine (PEPCID) 20 MG tablet TAKE 1 TABLET BY MOUTH TWICE DAILY. 180 tablet 0  . lisinopril (PRINIVIL,ZESTRIL) 10 MG tablet Take 1 tablet (10 mg total) by mouth daily. 90 tablet 3  . LORazepam (ATIVAN) 0.5 MG tablet TAKE 1/2-1 TABLET  ONCE DAILY AS NEEDED FOR ANXIETY. 20 tablet 0  . QUEtiapine (SEROQUEL) 50 MG tablet TAKE (1/2) TABLET TWICE DAILY. 90 tablet 1  . sertraline (ZOLOFT) 50 MG tablet TAKE 1 TABLET ONCE DAILY. 100 tablet 4  . simvastatin (ZOCOR) 40 MG tablet TAKE 1 TABLET ONCE DAILY AT 6 PM. 90 tablet 1   No current facility-administered medications for this visit.    Allergies:   Amoxicillin and Penicillins   Social History:  The patient  reports that she has never smoked. She has never used smokeless tobacco. She reports that she does not drink alcohol or use drugs.   Family History:  The patient's family history includes Brain cancer in her father; Colitis in her  brother; Colon cancer in her sister; Heart attack in her brother; Unexplained death in her mother.  ROS:  Please see the history of present illness.  All other systems are reviewed and otherwise negative.   PHYSICAL EXAM: *** VS:  There were no vitals taken for this visit. BMI: There is no height or weight on file to calculate BMI. Well nourished, well developed, in no acute distress  HEENT: normocephalic, atraumatic  Neck: no JVD, carotid bruits or masses Cardiac:  *** RRR; no significant murmurs, no rubs, or gallops Lungs:  *** CTA b/l, no wheezing, rhonchi or rales  Abd: soft, nontender MS: no deformity or *** atrophy Ext: *** no edema  Skin: warm and dry, no rash Neuro:  No gross deficits appreciated Psych: euthymic mood, full affect  *** PPM site is nontender, skin is intact, no tethering   EKG:  Done today and reviewed by myself:shows ***   09/30/2011: TTE Study Conclusions  - Left ventricle: The cavity size was normal. Wall thickness  was normal. The estimated ejection fraction was 55%.  Regional wall motion abnormalities cannot be excluded.  - Aortic valve: Mild regurgitation.  - Mitral valve: Mild regurgitation.    Recent Labs: 08/24/2018: ALT 10; BUN 20; Creatinine, Ser 1.05; Hemoglobin 12.9; Platelets 259; Potassium 5.1; Sodium 140  No results found for requested labs within last 8760 hours.   CrCl cannot be calculated (Patient's most recent lab result is older than the maximum 21 days allowed.).   Wt Readings from Last 3 Encounters:  02/15/19 145 lb (65.8 kg)  07/15/18 145 lb 8 oz (66 kg)  07/03/18 145 lb (65.8 kg)     Other studies reviewed: Additional studies/records reviewed today include: summarized above  ASSESSMENT AND PLAN:  1. PPM     ***  2. HTN     ***  3. New Afib     CHA2DS2Vasc is 5     ***     *** d/w son ***      Given advanced age, severe, advanced dementia, and *** fall ***    Disposition: F/u with ***  Current  medicines are reviewed at length with the patient today.  The patient did not have any concerns regarding medicines.***  Signed, Francis Dowse, PA-C 08/19/2019 8:38 AM     [facility] [address] [place] [address] [phone] (office)  [phone] (fax)

## 2019-08-20 ENCOUNTER — Other Ambulatory Visit: Payer: Self-pay

## 2019-08-20 ENCOUNTER — Telehealth: Payer: Self-pay | Admitting: *Deleted

## 2019-08-20 ENCOUNTER — Telehealth: Payer: PPO | Admitting: Physician Assistant

## 2019-08-20 NOTE — Telephone Encounter (Signed)
Attempted  patient son multiple time home number and cell number to do virtual visit. Patient son called back but didn't leave exact number.     Virtual Visit Pre-Appointment Phone Call  "(Name), I am calling you today to discuss your upcoming appointment. We are currently trying to limit exposure to the virus that causes COVID-19 by seeing patients at home rather than in the office."  1. "What is the BEST phone number to call the day of the visit?" - include this in appointment notes  2. "Do you have or have access to (through a family member/friend) a smartphone with video capability that we can use for your visit?" a. If yes - list this number in appt notes as "cell" (if different from BEST phone #) and list the appointment type as a VIDEO visit in appointment notes b. If no - list the appointment type as a PHONE visit in appointment notes  3. Confirm consent - "In the setting of the current Covid19 crisis, you are scheduled for a (phone or video) visit with your provider on (date) at (time).  Just as we do with many in-office visits, in order for you to participate in this visit, we must obtain consent.  If you'd like, I can send this to your mychart (if signed up) or email for you to review.  Otherwise, I can obtain your verbal consent now.  All virtual visits are billed to your insurance company just like a normal visit would be.  By agreeing to a virtual visit, we'd like you to understand that the technology does not allow for your provider to perform an examination, and thus may limit your provider's ability to fully assess your condition. If your provider identifies any concerns that need to be evaluated in person, we will make arrangements to do so.  Finally, though the technology is pretty good, we cannot assure that it will always work on either your or our end, and in the setting of a video visit, we may have to convert it to a phone-only visit.  In either situation, we cannot ensure  that we have a secure connection.  Are you willing to proceed?" STAFF: Did the patient verbally acknowledge consent to telehealth visit? Document YES/NO here: YES   4. Advise patient to be prepared - "Two hours prior to your appointment, go ahead and check your blood pressure, pulse, oxygen saturation, and your weight (if you have the equipment to check those) and write them all down. When your visit starts, your provider will ask you for this information. If you have an Apple Watch or Kardia device, please plan to have heart rate information ready on the day of your appointment. Please have a pen and paper handy nearby the day of the visit as well."  5. Give patient instructions for MyChart download to smartphone OR Doximity/Doxy.me as below if video visit (depending on what platform provider is using)  6. Inform patient they will receive a phone call 15 minutes prior to their appointment time (may be from unknown caller ID) so they should be prepared to answer    TELEPHONE CALL NOTE  TREVOR DUTY has been deemed a candidate for a follow-up tele-health visit to limit community exposure during the Covid-19 pandemic. I spoke with the patient via phone to ensure availability of phone/video source, confirm preferred email & phone number, and discuss instructions and expectations.  I reminded MAKENLEE MCKEAG to be prepared with any vital sign and/or heart  rhythm information that could potentially be obtained via home monitoring, at the time of her visit. I reminded WESTLYNN FIFER to expect a phone call prior to her visit.  Claude Manges, [place] 08/20/2019 2:14 PM   INSTRUCTIONS FOR DOWNLOADING THE MYCHART APP TO SMARTPHONE  - The patient must first make sure to have activated MyChart and know their login information - If Apple, go to [employer] and type in MyChart in the search bar and download the app. If Android, ask patient to go to [employer] and type in [place] in the search bar  and download the app. The app is free but as with any other app downloads, their phone may require them to verify saved payment information or Apple/Android password.  - The patient will need to then log into the app with their MyChart username and password, and select [facility] as their healthcare provider to link the account. When it is time for your visit, go to the MyChart app, find appointments, and click Begin Video Visit. Be sure to Select Allow for your device to access the Microphone and Camera for your visit. You will then be connected, and your provider will be with you shortly.  **If they have any issues connecting, or need assistance please contact MyChart service desk [phone])**  **If using a computer, in order to ensure the best quality for their visit they will need to use either of the following Internet Browsers: [employer], or Google Chrome**  IF USING DOXIMITY or DOXY.ME - The patient will receive a link just prior to their visit by text.     FULL LENGTH CONSENT FOR TELE-HEALTH VISIT   I hereby voluntarily request, consent and authorize [facility] and its employed or contracted physicians, physician assistants, nurse practitioners or other licensed health care professionals (the Practitioner), to provide me with telemedicine health care services (the "Services") as deemed necessary by the treating Practitioner. I acknowledge and consent to receive the Services by the Practitioner via telemedicine. I understand that the telemedicine visit will involve communicating with the Practitioner through live audiovisual communication technology and the disclosure of certain medical information by electronic transmission. I acknowledge that I have been given the opportunity to request an in-person assessment or other available alternative prior to the telemedicine visit and am voluntarily participating in the telemedicine visit.  I understand that I have the  right to withhold or withdraw my consent to the use of telemedicine in the course of my care at any time, without affecting my right to future care or treatment, and that the Practitioner or I may terminate the telemedicine visit at any time. I understand that I have the right to inspect all information obtained and/or recorded in the course of the telemedicine visit and may receive copies of available information for a reasonable fee.  I understand that some of the potential risks of receiving the Services via telemedicine include:  Marland Kitchen Delay or interruption in medical evaluation due to technological equipment failure or disruption; . Information transmitted may not be sufficient (e.g. poor resolution of images) to allow for appropriate medical decision making by the Practitioner; and/or  . In rare instances, security protocols could fail, causing a breach of personal health information.  Furthermore, I acknowledge that it is my responsibility to provide information about my medical history, conditions and care that is complete and accurate to the best of my ability. I acknowledge that Practitioner's advice, recommendations, and/or decision may be based on  factors not within their control, such as incomplete or inaccurate data provided by me or distortions of diagnostic images or specimens that may result from electronic transmissions. I understand that the practice of medicine is not an exact science and that Practitioner makes no warranties or guarantees regarding treatment outcomes. I acknowledge that I will receive a copy of this consent concurrently upon execution via email to the email address I last provided but may also request a printed copy by calling the office of [facility].    I understand that my insurance will be billed for this visit.   I have read or had this consent read to me. . I understand the contents of this consent, which adequately explains the benefits and risks of the Services  being provided via telemedicine.  . I have been provided ample opportunity to ask questions regarding this consent and the Services and have had my questions answered to my satisfaction. . I give my informed consent for the services to be provided through the use of telemedicine in my medical care  By participating in this telemedicine visit I agree to the above.

## 2019-08-31 ENCOUNTER — Emergency Department (HOSPITAL_COMMUNITY): Payer: Medicare Other

## 2019-08-31 ENCOUNTER — Inpatient Hospital Stay (HOSPITAL_COMMUNITY)
Admission: EM | Admit: 2019-08-31 | Discharge: 2019-09-02 | DRG: 640 | Disposition: A | Discharge status: Skilled Nursing Facility | Payer: Medicare Other | Attending: Internal Medicine | Admitting: Internal Medicine

## 2019-08-31 ENCOUNTER — Encounter (HOSPITAL_COMMUNITY): Payer: Self-pay | Admitting: Emergency Medicine

## 2019-08-31 ENCOUNTER — Other Ambulatory Visit: Payer: Self-pay

## 2019-08-31 DIAGNOSIS — Z6822 Body mass index (BMI) 22.0-22.9, adult: Secondary | ICD-10-CM

## 2019-08-31 DIAGNOSIS — H409 Unspecified glaucoma: Secondary | ICD-10-CM | POA: Diagnosis not present

## 2019-08-31 DIAGNOSIS — Z808 Family history of malignant neoplasm of other organs or systems: Secondary | ICD-10-CM

## 2019-08-31 DIAGNOSIS — I10 Essential (primary) hypertension: Secondary | ICD-10-CM | POA: Diagnosis not present

## 2019-08-31 DIAGNOSIS — F329 Major depressive disorder, single episode, unspecified: Secondary | ICD-10-CM | POA: Diagnosis not present

## 2019-08-31 DIAGNOSIS — Z515 Encounter for palliative care: Secondary | ICD-10-CM | POA: Diagnosis not present

## 2019-08-31 DIAGNOSIS — F419 Anxiety disorder, unspecified: Secondary | ICD-10-CM | POA: Diagnosis present

## 2019-08-31 DIAGNOSIS — I480 Paroxysmal atrial fibrillation: Secondary | ICD-10-CM

## 2019-08-31 DIAGNOSIS — E1149 Type 2 diabetes mellitus with other diabetic neurological complication: Secondary | ICD-10-CM | POA: Diagnosis present

## 2019-08-31 DIAGNOSIS — E785 Hyperlipidemia, unspecified: Secondary | ICD-10-CM | POA: Diagnosis present

## 2019-08-31 DIAGNOSIS — Z9181 History of falling: Secondary | ICD-10-CM | POA: Diagnosis not present

## 2019-08-31 DIAGNOSIS — R531 Weakness: Secondary | ICD-10-CM | POA: Diagnosis not present

## 2019-08-31 DIAGNOSIS — R296 Repeated falls: Secondary | ICD-10-CM | POA: Diagnosis present

## 2019-08-31 DIAGNOSIS — K219 Gastro-esophageal reflux disease without esophagitis: Secondary | ICD-10-CM | POA: Diagnosis present

## 2019-08-31 DIAGNOSIS — Z800 Family history of malignant neoplasm of digestive organs: Secondary | ICD-10-CM

## 2019-08-31 DIAGNOSIS — Z66 Do not resuscitate: Secondary | ICD-10-CM | POA: Diagnosis not present

## 2019-08-31 DIAGNOSIS — M2550 Pain in unspecified joint: Secondary | ICD-10-CM | POA: Diagnosis not present

## 2019-08-31 DIAGNOSIS — Z880 Allergy status to penicillin: Secondary | ICD-10-CM | POA: Diagnosis not present

## 2019-08-31 DIAGNOSIS — J189 Pneumonia, unspecified organism: Secondary | ICD-10-CM | POA: Diagnosis not present

## 2019-08-31 DIAGNOSIS — R5381 Other malaise: Secondary | ICD-10-CM | POA: Diagnosis not present

## 2019-08-31 DIAGNOSIS — F0391 Unspecified dementia with behavioral disturbance: Secondary | ICD-10-CM | POA: Diagnosis present

## 2019-08-31 DIAGNOSIS — Z8249 Family history of ischemic heart disease and other diseases of the circulatory system: Secondary | ICD-10-CM | POA: Diagnosis not present

## 2019-08-31 DIAGNOSIS — R627 Adult failure to thrive: Secondary | ICD-10-CM | POA: Diagnosis not present

## 2019-08-31 DIAGNOSIS — F03918 Unspecified dementia, unspecified severity, with other behavioral disturbance: Secondary | ICD-10-CM | POA: Diagnosis present

## 2019-08-31 DIAGNOSIS — Z950 Presence of cardiac pacemaker: Secondary | ICD-10-CM | POA: Diagnosis not present

## 2019-08-31 DIAGNOSIS — Z79899 Other long term (current) drug therapy: Secondary | ICD-10-CM

## 2019-08-31 DIAGNOSIS — R400 Somnolence: Secondary | ICD-10-CM | POA: Diagnosis present

## 2019-08-31 DIAGNOSIS — Z20828 Contact with and (suspected) exposure to other viral communicable diseases: Secondary | ICD-10-CM | POA: Diagnosis not present

## 2019-08-31 DIAGNOSIS — Z7401 Bed confinement status: Secondary | ICD-10-CM | POA: Diagnosis not present

## 2019-08-31 DIAGNOSIS — F0151 Vascular dementia with behavioral disturbance: Secondary | ICD-10-CM | POA: Diagnosis not present

## 2019-08-31 DIAGNOSIS — R404 Transient alteration of awareness: Secondary | ICD-10-CM | POA: Diagnosis not present

## 2019-08-31 DIAGNOSIS — I4510 Unspecified right bundle-branch block: Secondary | ICD-10-CM | POA: Diagnosis present

## 2019-08-31 DIAGNOSIS — R4182 Altered mental status, unspecified: Secondary | ICD-10-CM | POA: Diagnosis not present

## 2019-08-31 LAB — URINALYSIS, ROUTINE W REFLEX MICROSCOPIC
Bacteria, UA: NONE SEEN
Bilirubin Urine: NEGATIVE
Glucose, UA: NEGATIVE mg/dL
Ketones, ur: 20 mg/dL — AB
Leukocytes,Ua: NEGATIVE
Nitrite: NEGATIVE
Protein, ur: NEGATIVE mg/dL
Specific Gravity, Urine: 1.024 (ref 1.005–1.030)
pH: 5 (ref 5.0–8.0)

## 2019-08-31 LAB — COMPREHENSIVE METABOLIC PANEL
ALT: 14 U/L (ref 0–44)
AST: 16 U/L (ref 15–41)
Albumin: 3.9 g/dL (ref 3.5–5.0)
Alkaline Phosphatase: 47 U/L (ref 38–126)
Anion gap: 14 (ref 5–15)
BUN: 22 mg/dL (ref 8–23)
CO2: 25 mmol/L (ref 22–32)
Calcium: 9.1 mg/dL (ref 8.9–10.3)
Chloride: 103 mmol/L (ref 98–111)
Creatinine, Ser: 1.04 mg/dL — ABNORMAL HIGH (ref 0.44–1.00)
GFR calc Af Amer: 53 mL/min — ABNORMAL LOW (ref >60)
GFR calc non Af Amer: 46 mL/min — ABNORMAL LOW (ref >60)
Glucose, Bld: 92 mg/dL (ref 70–99)
Potassium: 4.2 mmol/L (ref 3.5–5.1)
Sodium: 142 mmol/L (ref 135–145)
Total Bilirubin: 1 mg/dL (ref 0.3–1.2)
Total Protein: 7.8 g/dL (ref 6.5–8.1)

## 2019-08-31 LAB — CBC WITH DIFFERENTIAL/PLATELET
Abs Immature Granulocytes: 0.06 10*3/uL (ref 0.00–0.07)
Basophils Absolute: 0 10*3/uL (ref 0.0–0.1)
Basophils Relative: 0 %
Eosinophils Absolute: 0 10*3/uL (ref 0.0–0.5)
Eosinophils Relative: 0 %
HCT: 45 % (ref 36.0–46.0)
Hemoglobin: 13.8 g/dL (ref 12.0–15.0)
Immature Granulocytes: 1 %
Lymphocytes Relative: 12 %
Lymphs Abs: 1.1 10*3/uL (ref 0.7–4.0)
MCH: 27.9 pg (ref 26.0–34.0)
MCHC: 30.7 g/dL (ref 30.0–36.0)
MCV: 90.9 fL (ref 80.0–100.0)
Monocytes Absolute: 0.5 10*3/uL (ref 0.1–1.0)
Monocytes Relative: 6 %
Neutro Abs: 8.1 10*3/uL — ABNORMAL HIGH (ref 1.7–7.7)
Neutrophils Relative %: 81 %
Platelets: 227 10*3/uL (ref 150–400)
RBC: 4.95 MIL/uL (ref 3.87–5.11)
RDW: 12.6 % (ref 11.5–15.5)
WBC: 9.9 10*3/uL (ref 4.0–10.5)
nRBC: 0 % (ref 0.0–0.2)

## 2019-08-31 LAB — LIPASE, BLOOD: Lipase: 23 U/L (ref 11–51)

## 2019-08-31 LAB — LACTIC ACID, PLASMA
Lactic Acid, Venous: 1.3 mmol/L (ref 0.5–1.9)
Lactic Acid, Venous: 1.7 mmol/L (ref 0.5–1.9)

## 2019-08-31 LAB — VALPROIC ACID LEVEL: Valproic Acid Lvl: 45 ug/mL — ABNORMAL LOW (ref 50.0–100.0)

## 2019-08-31 LAB — POC SARS CORONAVIRUS 2 AG -  ED: SARS Coronavirus 2 Ag: NEGATIVE

## 2019-08-31 LAB — PROCALCITONIN: Procalcitonin: <0.1 ng/mL

## 2019-08-31 MED ORDER — ENOXAPARIN SODIUM 40 MG/0.4ML ~~LOC~~ SOLN
40.0000 mg | Freq: Every day | SUBCUTANEOUS | Status: DC
  Administered 2019-08-31: 23:00:00 40 mg via SUBCUTANEOUS
  Filled 2019-08-31: qty 0.4

## 2019-08-31 MED ORDER — QUETIAPINE FUMARATE 25 MG PO TABS
25.0000 mg | ORAL_TABLET | Freq: Two times a day (BID) | ORAL | Status: DC
  Administered 2019-08-31 – 2019-09-02 (×3): 25 mg via ORAL
  Filled 2019-08-31 (×4): qty 1

## 2019-08-31 MED ORDER — SIMVASTATIN 40 MG PO TABS
40.0000 mg | ORAL_TABLET | Freq: Every day | ORAL | Status: DC
  Filled 2019-08-31: qty 1

## 2019-08-31 MED ORDER — DIVALPROEX SODIUM 250 MG PO DR TAB
250.0000 mg | DELAYED_RELEASE_TABLET | Freq: Three times a day (TID) | ORAL | Status: DC
  Administered 2019-08-31 – 2019-09-02 (×3): 250 mg via ORAL
  Filled 2019-08-31 (×5): qty 1

## 2019-08-31 MED ORDER — FAMOTIDINE 20 MG PO TABS
40.0000 mg | ORAL_TABLET | Freq: Every day | ORAL | Status: DC
  Administered 2019-08-31 – 2019-09-02 (×3): 40 mg via ORAL
  Filled 2019-08-31 (×3): qty 2

## 2019-08-31 MED ORDER — SODIUM CHLORIDE 0.9 % IV SOLN
1.0000 g | Freq: Once | INTRAVENOUS | Status: AC
  Administered 2019-08-31: 21:00:00 1 g via INTRAVENOUS
  Filled 2019-08-31: qty 10

## 2019-08-31 MED ORDER — SODIUM CHLORIDE 0.9 % IV SOLN
500.0000 mg | Freq: Once | INTRAVENOUS | Status: AC
  Administered 2019-08-31: 21:00:00 500 mg via INTRAVENOUS
  Filled 2019-08-31: qty 500

## 2019-08-31 MED ORDER — LISINOPRIL 10 MG PO TABS
10.0000 mg | ORAL_TABLET | Freq: Every day | ORAL | Status: DC
  Administered 2019-09-01 – 2019-09-02 (×2): 10 mg via ORAL
  Filled 2019-08-31 (×3): qty 1

## 2019-08-31 MED ORDER — SERTRALINE HCL 50 MG PO TABS
50.0000 mg | ORAL_TABLET | Freq: Every day | ORAL | Status: DC
  Administered 2019-08-31 – 2019-09-02 (×3): 50 mg via ORAL
  Filled 2019-08-31 (×3): qty 1

## 2019-08-31 MED ORDER — ACETAMINOPHEN 325 MG PO TABS: 650.0000 mg | ORAL_TABLET | Freq: Four times a day (QID) | ORAL | Status: DC | PRN

## 2019-08-31 MED ORDER — ACETAMINOPHEN 650 MG RE SUPP: 650.0000 mg | Freq: Four times a day (QID) | RECTAL | Status: DC | PRN

## 2019-08-31 NOTE — ED Provider Notes (Signed)
[facility]-EMERGENCY DEPT Provider Note   CSN: [id number] Arrival date & time: 08/31/19  1550     History Chief Complaint  Patient presents with  . Failure To Thrive    [name] is a 85 y.o. female.  Patient is a 85 year old female who presents via EMS for decline in mental status.  She currently lives at home with her son.  She has a history of hypertension, hyperlipidemia, diabetes, paroxysmal atrial fibrillation and dementia.  Her son states that he has had a hard time taking care of her it has become harder over the last week.  She has had a decline over the last 7 to 10 days where she is not as responsive as she normally is and she is not eating.  She normally will sit up in a chair but she is not able to sit up in the chair.  She normally will be able to ambulate with some assistance but he has had a hard time getting her up out of bed.  She is under hospice care.  History is limited due to her dementia.        Past Medical History:  Diagnosis Date  . ANEMIA, DEFICIENCY, HX OF 03/01/2010  . Anxiety   . DEPRESSIVE DISORDER 09/19/2010  . DM w/o Complication Type II 05/18/2007  . GERD 05/18/2007  . Glaucoma   . HYPERLIPIDEMIA 03/17/2008  . HYPERTENSION 05/18/2007  . INSOMNIA 11/23/2008  . Kidney stone   . Loss of weight 05/30/2010    Patient Active Problem List   Diagnosis Date Noted  . Osteoarthritis of both knees 07/15/2018  . High risk medication use 06/02/2018  . Syncope 05/27/2018  . 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 UTI (urinary tract infection) 08/19/2017  . Dementia with behavioral disturbance (HCC) 08/19/2017  . Anxiety 10/27/2012  . Symptomatic bradycardia 10/01/2011  . Status post cardiac pacemaker procedure 10/01/2011  . Atrial fibrillation (HCC) 09/29/2011  . Pain disorder 04/19/2011  . Chest pain, atypical 02/20/2011  . Depression, major 09/19/2010  . LOSS OF WEIGHT 05/30/2010    . ANEMIA, DEFICIENCY, HX OF 03/01/2010  . INSOMNIA 11/23/2008  . Dyslipidemia 03/17/2008  . 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 Essential hypertension 05/18/2007  . GERD 05/18/2007    Past Surgical History:  Procedure Laterality Date  . ABDOMINAL HYSTERECTOMY    . APPENDECTOMY    . CATARACT EXTRACTION    . COLONOSCOPY  06-28-10   per Dr. Arlyce Dice, diverticulosis only   . DILATION AND CURETTAGE OF UTERUS    . ESOPHAGOGASTRODUODENOSCOPY  05-13-11   per Dr. Arlyce Dice, normal   . HEMORRHOID SURGERY    . PACEMAKER INSERTION  09/2011  . PERMANENT PACEMAKER INSERTION N/A 09/30/2011   Procedure: PERMANENT PACEMAKER INSERTION;  Surgeon: Marinus Maw, MD;  Location: [facility] CATH LAB;  Service: Cardiovascular;  Laterality: N/A;     OB History    Gravida      Para      Term      Preterm      AB      Living  3     SAB      TAB      Ectopic      Multiple      Live Births              Family History  Problem Relation Age of Onset  . Unexplained death Mother   .  Brain cancer Father        brain tumor  . Colon cancer Sister   . Colitis Brother   . Heart attack Brother     Social History   Tobacco Use  . Smoking status: Never Smoker  . Smokeless tobacco: Never Used  Substance Use Topics  . Alcohol use: No  . Drug use: No    Home Medications Prior to Admission medications   Medication Sig Start Date End Date Taking? Authorizing Provider  acetaminophen (TYLENOL) 500 MG tablet Take 1,000 mg by mouth every 6 (six) hours as needed for moderate pain (pain). For pain   Yes [provider]  divalproex (DEPAKOTE) 250 MG DR tablet Take 250 mg by mouth 3 (three) times daily. 08/25/19  Yes [provider]  famotidine (PEPCID) 40 MG tablet Take 40 mg by mouth daily. 07/06/19  Yes [provider]  lisinopril (PRINIVIL,ZESTRIL) 10 MG tablet Take 1 tablet (10 mg total) by mouth daily. 09/23/18  Yes [place], Betty G,  MD  LORazepam (ATIVAN) 1 MG tablet Take 1 mg by mouth every 4 (four) hours as needed for anxiety.  08/25/19  Yes [provider]  QUEtiapine (SEROQUEL) 50 MG tablet TAKE (1/2) TABLET TWICE DAILY. Patient taking differently: Take 25 mg by mouth 2 (two) times daily.  01/15/19  Yes [place], Betty G, MD  sertraline (ZOLOFT) 50 MG tablet TAKE 1 TABLET ONCE DAILY. Patient taking differently: Take 50 mg by mouth daily.  05/26/18  Yes Roderick Pee, MD  simvastatin (ZOCOR) 40 MG tablet TAKE 1 TABLET ONCE DAILY AT 6 PM. Patient taking differently: Take 40 mg by mouth daily at 6 PM.  01/15/19  Yes [place], Betty G, MD  traMADol (ULTRAM) 50 MG tablet Take 50 mg by mouth daily as needed for pain. 04/03/19  Yes [provider]    Allergies    Amoxicillin and Penicillins  Review of Systems   Review of Systems  Unable to perform ROS: Dementia    Physical Exam Updated Vital Signs BP (!) 168/78 (BP Location: Left Arm)   Pulse 63   Temp 97.9 F (36.6 C) (Rectal)   Resp (!) 24   Ht  (1.727 m)   Wt 65.8 kg   SpO2 98%   BMI 22.06 kg/m   Physical Exam Constitutional:      Appearance: She is well-developed.  HENT:     Head: Normocephalic and atraumatic.  Eyes:     Pupils: Pupils are equal, round, and reactive to light.  Cardiovascular:     Rate and Rhythm: Normal rate and regular rhythm.     Heart sounds: Normal heart sounds.  Pulmonary:     Effort: Pulmonary effort is normal. No respiratory distress.     Breath sounds: Normal breath sounds. No wheezing or rales.  Chest:     Chest wall: No tenderness.  Abdominal:     General: Bowel sounds are normal.     Palpations: Abdomen is soft.     Tenderness: There is no abdominal tenderness. There is no guarding or rebound.  Musculoskeletal:        General: Normal range of motion.     Cervical back: Normal range of motion and neck supple.  Lymphadenopathy:     Cervical: No cervical adenopathy.  Skin:    General: Skin is warm  and dry.     Findings: No rash.  Neurological:     Mental Status: She is alert.  Comments: Patient lying in bed with eyes closed.  She will open her eyes to painful stimuli.  She does yell out at times but is nonverbal.  She does not have any obvious focal deficits.  She will not follow commands.     ED Results / Procedures / Treatments   Labs (all labs ordered are listed, but only abnormal results are displayed) Labs Reviewed  COMPREHENSIVE METABOLIC PANEL - Abnormal; Notable for the following components:      Result Value   Creatinine, Ser 1.04 (*)    GFR calc non Af Amer 46 (*)    GFR calc Af Amer 53 (*)    All other components within normal limits  CBC WITH DIFFERENTIAL/PLATELET - Abnormal; Notable for the following components:   Neutro Abs 8.1 (*)    All other components within normal limits  URINALYSIS, ROUTINE W REFLEX MICROSCOPIC - Abnormal; Notable for the following components:   Hgb urine dipstick SMALL (*)    Ketones, ur 20 (*)    All other components within normal limits  SARS CORONAVIRUS 2 (TAT 6-24 HRS)  LIPASE, BLOOD  LACTIC ACID, PLASMA  LACTIC ACID, PLASMA  POC SARS CORONAVIRUS 2 AG -  ED    EKG EKG Interpretation  Date/Time:  Tuesday August 31 2019 16:51:31 EST Ventricular Rate:  63 PR Interval:    QRS Duration: 136 QT Interval:  471 QTC Calculation: 483 R Axis:   -45 Text Interpretation: Sinus rhythm Short PR interval RBBB and LAFB Nonspecific T abnormalities, lateral leads Confirmed by Rolan BuccoBelfi, Adelle Zachar [phone]([id number]) on 08/31/2019 4:56:58 PM   Radiology CT Head Wo Contrast  Result Date: 08/31/2019 CLINICAL DATA:  Altered mental status. EXAM: CT HEAD WITHOUT CONTRAST TECHNIQUE: Contiguous axial images were obtained from the base of the skull through the vertex without intravenous contrast. COMPARISON:  CT head dated August 24, 2018. FINDINGS: The study is limited by motion artifact. Brain: No evidence of acute infarction, hemorrhage, hydrocephalus,  extra-axial collection or mass lesion/mass effect. Stable atrophy and chronic microvascular ischemic changes. Old lacunar infarcts in the left thalamus and basal ganglia again noted. Vascular: Atherosclerotic vascular calcification of the carotid siphons. No hyperdense vessel. Skull: Negative for fracture or focal lesion. Sinuses/Orbits: No acute finding. Other: None. IMPRESSION: 1. Motion limited study.  No acute intracranial abnormality. 2. Stable atrophy and chronic microvascular ischemic changes. Electronically Signed   By: Obie DredgeWilliam T Derry M.D.   On: 08/31/2019 18:15   DG Chest Port 1 View  Result Date: 08/31/2019 CLINICAL DATA:  Weakness and altered mental status. EXAM: PORTABLE CHEST 1 VIEW COMPARISON:  08/24/2018 FINDINGS: Dual lead pacemaker appears the same. Chronic cardiomegaly. Right chest remains clear. Question of volume loss and pneumonia in the left lower lobe. Small amount of pleural fluid probably present on the left as well. IMPRESSION: Suspicion of left lower lobe atelectasis/pneumonia with a small left effusion. Electronically Signed   By: Paulina FusiMark  Shogry M.D.   On: 08/31/2019 17:49    Procedures Procedures (including critical care time)  Medications Ordered in ED Medications  cefTRIAXone (ROCEPHIN) 1 g in sodium chloride 0.9 % 100 mL IVPB (1 g Intravenous New Bag/Given 08/31/19 2036)  azithromycin (ZITHROMAX) 500 mg in sodium chloride 0.9 % 250 mL IVPB (has no administration in time range)    ED Course  I have reviewed the triage vital signs and the nursing notes.  Pertinent labs & imaging results that were available during my care of the patient were reviewed by me and considered in  my medical decision making (see chart for details).    MDM Rules/Calculators/A&P                     Patient is a 85 year old female who arrives from home with a decline in her mental status.  She is less active and not eating.  She reportedly is in hospice but still receiving care and  medications.  I spoke with her son who advises that he does not want her to be a DNR but she does not currently have paperwork on that.  Reportedly she had a previous DNR but lost the paperwork.  Today her labs are nonconcerning.  She is not hypotensive.  She is not febrile.  She is mildly tachypneic.  She has no hypoxia.  Her chest x-ray shows a probable left lower lobe infiltrate and she was started on antibiotics for this.  Her rapid Covid is negative and her other Covid test is pending.  I spoke with Dr. Posey Pronto who will admit her for further evaluation. Final Clinical Impression(s) / ED Diagnoses Final diagnoses:  Community acquired pneumonia of left lower lobe of lung    Rx / DC Orders ED Discharge Orders    None       Malvin Johns, MD 08/31/19 2044

## 2019-08-31 NOTE — H&P (Signed)
History and Physical    [name] CBJ:[id number] DOB: [birth year] DOA: 08/31/2019  PCP: Lynnell Jude, MD  Patient coming from: Home  I have personally briefly reviewed patient's old medical records in [place]  Chief Complaint: Generalized weakness, falls  HPI: [name] is a 85 y.o. female with medical history significant for dementia with behavioral disturbance, paroxysmal atrial fibrillation s/p PPM not on anticoagulation, diet-controlled type 2 diabetes, hypertension, hyperlipidemia, anxiety/depression who is currently under hospice care presents to the ED for evaluation of worsening lethargy/generalized weakness, decreased oral intake, and worsening confusion.  Patient is unable to provide any history on her own therefore entirety history is obtained from [place], chart review, and son by phone.  Patient currently lives with son and has a hospice nurse visit every Wednesday.  Patient's son states that she has advanced dementia and at her baseline can occasionally be set up in wheelchair and will recognize him.  She has had notable decline in function and he has been unable to get her up into a chair, to the bathroom, or help feed her.  She has not been eating and is less interactive than usual.  She has had some falls at home which are described as if she just collapses to the ground but has not had any significant injury.  ED Course:  Initial vitals showed BP 180/78, pulse 64, RR 14, temp 97.9 Fahrenheit, SPO2 100% on room air.  Initial labs show sodium 142, potassium 4.2, bicarb 25, BUN 22, creatinine 1.04, AST 16, ALT 14, alk phos 47, total bilirubin 1.0, WBC 9.9, hemoglobin 13.8, platelets 227,000, lipase 23, lactic acid 1.3 and 1.7.  Urinalysis shows negative nitrates, negative leukocytes, 6-10 RBCs, 0-5 WBCs, no bacteria on microscopy.  POC SARS CoV 2 antigen test is negative, SARS-CoV-2 PCR test obtained and pending.  CT head without contrast was motion  limited, no acute intracranial abnormality noted.  Stable atrophy and chronic microvascular ischemic changes are seen.  Portable chest x-ray shows pacemaker in place.  Question of small volume loss/pneumonia in the left lower lobe is noted.  Patient was given IV ceftriaxone and azithromycin and the hospitalist service was consulted admit for further evaluation and management.  Review of Systems:  Unable to obtain full review of systems due to patient excessive somnolence and dementia.   Past Medical History:  Diagnosis Date  . ANEMIA, DEFICIENCY, HX OF 03/01/2010  . Anxiety   . DEPRESSIVE DISORDER 09/19/2010  . DM w/o Complication Type II 1/60/7371  . GERD 05/18/2007  . Glaucoma   . HYPERLIPIDEMIA 03/17/2008  . HYPERTENSION 05/18/2007  . INSOMNIA 11/23/2008  . Kidney stone   . Loss of weight 05/30/2010    Past Surgical History:  Procedure Laterality Date  . ABDOMINAL HYSTERECTOMY    . APPENDECTOMY    . CATARACT EXTRACTION    . COLONOSCOPY  06-28-10   per Dr. Deatra Ina, diverticulosis only   . DILATION AND CURETTAGE OF UTERUS    . ESOPHAGOGASTRODUODENOSCOPY  05-13-11   per Dr. Deatra Ina, normal   . HEMORRHOID SURGERY    . PACEMAKER INSERTION  09/2011  . PERMANENT PACEMAKER INSERTION N/A 09/30/2011   Procedure: PERMANENT PACEMAKER INSERTION;  Surgeon: Evans Lance, MD;  Location: [facility] CATH LAB;  Service: Cardiovascular;  Laterality: N/A;    Social History:  reports that she has never smoked. She has never used smokeless tobacco. She reports that she does not drink alcohol or use drugs.  Allergies  Allergen Reactions  .  Amoxicillin Rash  . Penicillins Rash    Has patient had a PCN reaction causing immediate rash, facial/tongue/throat swelling, SOB or lightheadedness with hypotension: unknown Has patient had a PCN reaction causing severe rash involving mucus membranes or skin necrosis: unkonwn Has patient had a PCN reaction that required hospitalization unknown Has patient had a PCN  reaction occurring within the last 10 years: No If all of the above ansers are "NO", then may proceed with Cephalosporin use.     Family History  Problem Relation Age of Onset  . Unexplained death Mother   . Brain cancer Father        brain tumor  . Colon cancer Sister   . Colitis Brother   . Heart attack Brother      Prior to Admission medications   Medication Sig Start Date End Date Taking? Authorizing Provider  acetaminophen (TYLENOL) 500 MG tablet Take 1,000 mg by mouth every 6 (six) hours as needed for moderate pain (pain). For pain   Yes [provider]  divalproex (DEPAKOTE) 250 MG DR tablet Take 250 mg by mouth 3 (three) times daily. 08/25/19  Yes [provider]  famotidine (PEPCID) 40 MG tablet Take 40 mg by mouth daily. 07/06/19  Yes [provider]  lisinopril (PRINIVIL,ZESTRIL) 10 MG tablet Take 1 tablet (10 mg total) by mouth daily. 09/23/18  Yes [place], Betty G, MD  LORazepam (ATIVAN) 1 MG tablet Take 1 mg by mouth every 4 (four) hours as needed for anxiety.  08/25/19  Yes [provider]  QUEtiapine (SEROQUEL) 50 MG tablet TAKE (1/2) TABLET TWICE DAILY. Patient taking differently: Take 25 mg by mouth 2 (two) times daily.  01/15/19  Yes [place], Betty G, MD  sertraline (ZOLOFT) 50 MG tablet TAKE 1 TABLET ONCE DAILY. Patient taking differently: Take 50 mg by mouth daily.  05/26/18  Yes Dorena Cookey, MD  simvastatin (ZOCOR) 40 MG tablet TAKE 1 TABLET ONCE DAILY AT 6 PM. Patient taking differently: Take 40 mg by mouth daily at 6 PM.  01/15/19  Yes [place], Betty G, MD  traMADol (ULTRAM) 50 MG tablet Take 50 mg by mouth daily as needed for pain. 04/03/19  Yes [provider]    Physical Exam: Vitals:   08/31/19 1730 08/31/19 1800 08/31/19 2023 08/31/19 2038  BP: (!) 188/89 (!) 151/68  (!) 168/78  Pulse: 60 (!) 47  63  Resp: (!) 21 (!) 24    Temp:      TempSrc:      SpO2: 100% (!) 86% 98% 98%  Weight:      Height:       Exam  limited due to excessive somnolence. Constitutional: Resting in bed in the right lateral decubitus position, asleep and difficult to arouse Eyes: PERRL, lids and conjunctivae normal ENMT: Mucous membranes are moist. Posterior pharynx clear of any exudate or lesions. Neck: normal, supple, no masses. Respiratory: clear to auscultation bilaterally, no wheezing, no crackles. Normal respiratory effort. No accessory muscle use.  Cardiovascular: Regular rate and rhythm, no murmurs / rubs / gallops. No extremity edema. 2+ pedal pulses. PPM in place left chest fall. Abdomen: no tenderness, no masses palpated. No hepatosplenomegaly. Bowel sounds positive.  Musculoskeletal: no clubbing / cyanosis.  Moving all extremities spontaneously.   Skin: no rashes, lesions, ulcers. No induration Neurologic: Limited due to somnolence.  Moving extremities spontaneously.  Withdraws to painful stimuli otherwise difficult to arouse and not following commands. Psychiatric: Somnolent and difficult to arouse.  Labs on Admission: I have personally reviewed following labs and imaging studies  CBC: Recent Labs  Lab 08/31/19 1654  WBC 9.9  NEUTROABS 8.1*  HGB 13.8  HCT 45.0  MCV 90.9  PLT 412   Basic Metabolic Panel: Recent Labs  Lab 08/31/19 1654  NA 142  K 4.2  CL 103  CO2 25  GLUCOSE 92  BUN 22  CREATININE 1.04*  CALCIUM 9.1   GFR: Estimated Creatinine Clearance: 33.4 mL/min (A) (by C-G formula based on SCr of 1.04 mg/dL (H)). Liver Function Tests: Recent Labs  Lab 08/31/19 1654  AST 16  ALT 14  ALKPHOS 47  BILITOT 1.0  PROT 7.8  ALBUMIN 3.9   Recent Labs  Lab 08/31/19 1654  LIPASE 23   No results for input(s): AMMONIA in the last 168 hours. Coagulation Profile: No results for input(s): INR, PROTIME in the last 168 hours. Cardiac Enzymes: No results for input(s): CKTOTAL, CKMB, CKMBINDEX, TROPONINI in the last 168 hours. BNP (last 3 results) No results for input(s): PROBNP in the  last 8760 hours. HbA1C: No results for input(s): HGBA1C in the last 72 hours. CBG: No results for input(s): GLUCAP in the last 168 hours. Lipid Profile: No results for input(s): CHOL, HDL, LDLCALC, TRIG, CHOLHDL, LDLDIRECT in the last 72 hours. Thyroid Function Tests: No results for input(s): TSH, T4TOTAL, FREET4, T3FREE, THYROIDAB in the last 72 hours. Anemia Panel: No results for input(s): VITAMINB12, FOLATE, FERRITIN, TIBC, IRON, RETICCTPCT in the last 72 hours. Urine analysis:    Component Value Date/Time   COLORURINE YELLOW 08/31/2019 1815   APPEARANCEUR CLEAR 08/31/2019 1815   LABSPEC 1.024 08/31/2019 1815   PHURINE 5.0 08/31/2019 1815   GLUCOSEU NEGATIVE 08/31/2019 1815   HGBUR SMALL (A) 08/31/2019 1815   HGBUR negative 04/19/2010 1422   BILIRUBINUR NEGATIVE 08/31/2019 1815   BILIRUBINUR 1+ 06/24/2012 1447   KETONESUR 20 (A) 08/31/2019 1815   PROTEINUR NEGATIVE 08/31/2019 1815   UROBILINOGEN 0.2 10/18/2014 2027   NITRITE NEGATIVE 08/31/2019 1815   LEUKOCYTESUR NEGATIVE 08/31/2019 1815    Radiological Exams on Admission: CT Head Wo Contrast  Result Date: 08/31/2019 CLINICAL DATA:  Altered mental status. EXAM: CT HEAD WITHOUT CONTRAST TECHNIQUE: Contiguous axial images were obtained from the base of the skull through the vertex without intravenous contrast. COMPARISON:  CT head dated August 24, 2018. FINDINGS: The study is limited by motion artifact. Brain: No evidence of acute infarction, hemorrhage, hydrocephalus, extra-axial collection or mass lesion/mass effect. Stable atrophy and chronic microvascular ischemic changes. Old lacunar infarcts in the left thalamus and basal ganglia again noted. Vascular: Atherosclerotic vascular calcification of the carotid siphons. No hyperdense vessel. Skull: Negative for fracture or focal lesion. Sinuses/Orbits: No acute finding. Other: None. IMPRESSION: 1. Motion limited study.  No acute intracranial abnormality. 2. Stable atrophy and  chronic microvascular ischemic changes. Electronically Signed   By: Titus Dubin M.D.   On: 08/31/2019 18:15   DG Chest Port 1 View  Result Date: 08/31/2019 CLINICAL DATA:  Weakness and altered mental status. EXAM: PORTABLE CHEST 1 VIEW COMPARISON:  08/24/2018 FINDINGS: Dual lead pacemaker appears the same. Chronic cardiomegaly. Right chest remains clear. Question of volume loss and pneumonia in the left lower lobe. Small amount of pleural fluid probably present on the left as well. IMPRESSION: Suspicion of left lower lobe atelectasis/pneumonia with a small left effusion. Electronically Signed   By: Nelson Chimes M.D.   On: 08/31/2019 17:49    EKG: Independently reviewed. Sinus rhythm, RBBB  and LAFB, QTC 483.  Prior EKG showed atrial flutter.  Assessment/Plan Principal Problem:   Generalized weakness Active Problems:   Type 2 diabetes mellitus with neurological complications (HCC)   Dyslipidemia   Essential hypertension   Paroxysmal atrial fibrillation (HCC)   Dementia with behavioral disturbance ([place])  [name] is a 85 y.o. female with medical history significant for dementia with behavioral disturbance, paroxysmal atrial fibrillation s/p PPM not on anticoagulation, diet-controlled type 2 diabetes, hypertension, hyperlipidemia, anxiety/depression who is currently under hospice care who presents for failure to thrive.  Generalized weakness/failure to thrive in setting of advanced dementia: There was question of pneumonia on chest x-ray and patient was given IV antibiotics in the ED however patient is afebrile, without leukocytosis, and at this point I do not have strong suspicion of ongoing bacterial infection.  Urinalysis is negative.  This is likely progression of her advanced dementia and her elderly age.  Son states that he cannot safely care for her as he is unable to take her to the bathroom or even adequately feed her.  She is having increased falls at home. -Hold further  antibiotics -Continue fall precautions and safety sitter -Request PT/OT/SLP eval -Request transitions of care team for potential placement -She is DNR, her son would like [place] to focus on quality of life -She is not safe to return home in her current condition as she is high risk for significant injury from fall  Paroxysmal atrial fibrillation s/p PPM: In sinus rhythm at time of admission.  Not on anticoagulation due to propensity for falls.  Hypertension: Hypertensive on admission.  Resume home lisinopril.  Hyperlipidemia: Continue simvastatin.  Diet-controlled type 2 diabetes: Continue to monitor.  Dementia with behavioral disturbance/depression with anxiety: Continue Depakote, check level.  Continue Seroquel and Zoloft.  Hold Ativan for now due to excessive somnolence.  Continue delirium precautions.   DVT prophylaxis: Lovenox Code Status: DNR, confirmed with son Family Communication: Discussed with son Sokhna Christoph by phone [phone] Disposition Plan: Pending clinical progress, likely needs placement Consults called: None Admission status: Observation   Zada Finders MD [facility]  If 7PM-7AM, please contact night-coverage [email or web address]  08/31/2019, 9:50 PM

## 2019-08-31 NOTE — ED Triage Notes (Signed)
Patient arrived by EMS from home. Pt's family c/o Failure to Thrive. Family reports increased falls and patient appears more lethargic. Patient was given Ativan last night per EMS. Patient usually doesn't receive Ativan per EMS.   Patient has Dementia. Patient is under Hospice care.   Patient is at baseline per EMS.   Patient is living in poor conditions and may require placement per EMS.

## 2019-08-31 NOTE — ED Notes (Signed)
Will do pt vitals when pt wakes up

## 2019-09-01 DIAGNOSIS — Z66 Do not resuscitate: Secondary | ICD-10-CM | POA: Diagnosis present

## 2019-09-01 DIAGNOSIS — Z8249 Family history of ischemic heart disease and other diseases of the circulatory system: Secondary | ICD-10-CM | POA: Diagnosis not present

## 2019-09-01 DIAGNOSIS — R531 Weakness: Secondary | ICD-10-CM | POA: Diagnosis present

## 2019-09-01 DIAGNOSIS — E785 Hyperlipidemia, unspecified: Secondary | ICD-10-CM | POA: Diagnosis present

## 2019-09-01 DIAGNOSIS — I480 Paroxysmal atrial fibrillation: Secondary | ICD-10-CM

## 2019-09-01 DIAGNOSIS — Z515 Encounter for palliative care: Secondary | ICD-10-CM | POA: Diagnosis present

## 2019-09-01 DIAGNOSIS — Z950 Presence of cardiac pacemaker: Secondary | ICD-10-CM | POA: Diagnosis not present

## 2019-09-01 DIAGNOSIS — R627 Adult failure to thrive: Secondary | ICD-10-CM | POA: Diagnosis present

## 2019-09-01 DIAGNOSIS — Z880 Allergy status to penicillin: Secondary | ICD-10-CM | POA: Diagnosis not present

## 2019-09-01 DIAGNOSIS — J189 Pneumonia, unspecified organism: Secondary | ICD-10-CM | POA: Diagnosis present

## 2019-09-01 DIAGNOSIS — I10 Essential (primary) hypertension: Secondary | ICD-10-CM

## 2019-09-01 DIAGNOSIS — F0391 Unspecified dementia with behavioral disturbance: Secondary | ICD-10-CM | POA: Diagnosis present

## 2019-09-01 DIAGNOSIS — Z800 Family history of malignant neoplasm of digestive organs: Secondary | ICD-10-CM | POA: Diagnosis not present

## 2019-09-01 DIAGNOSIS — F329 Major depressive disorder, single episode, unspecified: Secondary | ICD-10-CM | POA: Diagnosis present

## 2019-09-01 DIAGNOSIS — R296 Repeated falls: Secondary | ICD-10-CM | POA: Diagnosis present

## 2019-09-01 DIAGNOSIS — E1149 Type 2 diabetes mellitus with other diabetic neurological complication: Secondary | ICD-10-CM

## 2019-09-01 DIAGNOSIS — F0151 Vascular dementia with behavioral disturbance: Secondary | ICD-10-CM

## 2019-09-01 DIAGNOSIS — Z20828 Contact with and (suspected) exposure to other viral communicable diseases: Secondary | ICD-10-CM | POA: Diagnosis present

## 2019-09-01 DIAGNOSIS — Z9181 History of falling: Secondary | ICD-10-CM | POA: Diagnosis not present

## 2019-09-01 DIAGNOSIS — K219 Gastro-esophageal reflux disease without esophagitis: Secondary | ICD-10-CM | POA: Diagnosis present

## 2019-09-01 DIAGNOSIS — H409 Unspecified glaucoma: Secondary | ICD-10-CM | POA: Diagnosis present

## 2019-09-01 DIAGNOSIS — Z808 Family history of malignant neoplasm of other organs or systems: Secondary | ICD-10-CM | POA: Diagnosis not present

## 2019-09-01 DIAGNOSIS — Z79899 Other long term (current) drug therapy: Secondary | ICD-10-CM | POA: Diagnosis not present

## 2019-09-01 DIAGNOSIS — F419 Anxiety disorder, unspecified: Secondary | ICD-10-CM | POA: Diagnosis present

## 2019-09-01 DIAGNOSIS — Z6822 Body mass index (BMI) 22.0-22.9, adult: Secondary | ICD-10-CM | POA: Diagnosis not present

## 2019-09-01 LAB — BASIC METABOLIC PANEL
Anion gap: 13 (ref 5–15)
BUN: 21 mg/dL (ref 8–23)
CO2: 23 mmol/L (ref 22–32)
Calcium: 9 mg/dL (ref 8.9–10.3)
Chloride: 102 mmol/L (ref 98–111)
Creatinine, Ser: 0.94 mg/dL (ref 0.44–1.00)
GFR calc Af Amer: >60 mL/min (ref >60)
GFR calc non Af Amer: 52 mL/min — ABNORMAL LOW (ref >60)
Glucose, Bld: 84 mg/dL (ref 70–99)
Potassium: 3.8 mmol/L (ref 3.5–5.1)
Sodium: 138 mmol/L (ref 135–145)

## 2019-09-01 LAB — CBC
HCT: 46.2 % — ABNORMAL HIGH (ref 36.0–46.0)
Hemoglobin: 14.1 g/dL (ref 12.0–15.0)
MCH: 28.1 pg (ref 26.0–34.0)
MCHC: 30.5 g/dL (ref 30.0–36.0)
MCV: 92 fL (ref 80.0–100.0)
Platelets: 209 10*3/uL (ref 150–400)
RBC: 5.02 MIL/uL (ref 3.87–5.11)
RDW: 12.5 % (ref 11.5–15.5)
WBC: 10.8 10*3/uL — ABNORMAL HIGH (ref 4.0–10.5)
nRBC: 0 % (ref 0.0–0.2)

## 2019-09-01 LAB — SARS CORONAVIRUS 2 (TAT 6-24 HRS): SARS Coronavirus 2: NEGATIVE

## 2019-09-01 NOTE — Evaluation (Signed)
Clinical/Bedside Swallow Evaluation Patient Details  Name: [name] MRN: [id number] Date of Birth: 10-22-1922  Today's Date: 09/01/2019 Time: SLP Start Time (ACUTE ONLY): 6004 SLP Stop Time (ACUTE ONLY): 0908 SLP Time Calculation (min) (ACUTE ONLY): 25 min  Past Medical History:  Past Medical History:  Diagnosis Date  . ANEMIA, DEFICIENCY, HX OF 03/01/2010  . Anxiety   . DEPRESSIVE DISORDER 09/19/2010  . DM w/o Complication Type II 05/18/2007  . GERD 05/18/2007  . Glaucoma   . HYPERLIPIDEMIA 03/17/2008  . HYPERTENSION 05/18/2007  . INSOMNIA 11/23/2008  . Kidney stone   . Loss of weight 05/30/2010   Past Surgical History:  Past Surgical History:  Procedure Laterality Date  . ABDOMINAL HYSTERECTOMY    . APPENDECTOMY    . CATARACT EXTRACTION    . COLONOSCOPY  06-28-10   per Dr. Arlyce Dice, diverticulosis only   . DILATION AND CURETTAGE OF UTERUS    . ESOPHAGOGASTRODUODENOSCOPY  05-13-11   per Dr. Arlyce Dice, normal   . HEMORRHOID SURGERY    . PACEMAKER INSERTION  09/2011  . PERMANENT PACEMAKER INSERTION N/A 09/30/2011   Procedure: PERMANENT PACEMAKER INSERTION;  Surgeon: Marinus Maw, MD;  Location: [facility] CATH LAB;  Service: Cardiovascular;  Laterality: N/A;   HPI:  [name] is a 85 y.o. female with medical history significant for dementia with behavioral disturbance, paroxysmal atrial fibrillation s/p PPM not on anticoagulation, diet-controlled type 2 diabetes, hypertension, hyperlipidemia, anxiety/depression who is currently under hospice care presents to the ED for evaluation of worsening lethargy/generalized weakness, decreased oral intake, and worsening confusion.  No previously documented dysphagia hx.    Assessment / Plan / Recommendation Clinical Impression  Pt presents with oral dysphagia, likely secondary to cognitive deficits.  She was encountered asleep in bed and she roused to max verbal and tactile stimulation.  When she was awake, her eyes remained closed and her  communication attempts were limited to verbal perseveration of "Mama".  Pt exhibited fairly good bolus acceptance with thin liquid via straw sip and puree via spoon.  She was able to independently draw liquids from the straw and AP transport appeared timely.  AP transport was minimally prolonged with puree trials; however, limited view of oral cavity did not reveal any residue following swallow initiation.  Additionally, no clinical s/sx of aspiration were observed with thin liquid or puree.  Attempted regular solid trial x1; however, pt held the cracker in between her lips and did not attempt to move it posteriorly into her oral cavity for mastication.  Suspect that pt's oral and pharyngeal phases of the swallow are grossly functional; however, cognitive deficits and pt's current mentation are impacting her oral phase which places her at an increased risk for aspiration. Therefore, recommend diet change to Dysphagia 1 (puree) solids and continuation of thin liquid with the following precautions: 1) Small bites/sips 2) Sit upright 90 degrees 3) Slow rate of intake.  Pt will benefit from full supervision to assist with feeding and to cue for compensatory strategies.  Hopeful that pt will be able to safely tolerate more complex solids if/when mentation improves.  SLP will f/u to monitor diet tolerance per POC.    SLP Visit Diagnosis: Dysphagia, oral phase (R13.11)    Aspiration Risk  Mild aspiration risk    Diet Recommendation Dysphagia 1 (Puree);Thin liquid   Liquid Administration via: Spoon Medication Administration: Crushed with puree Supervision: Full supervision/cueing for compensatory strategies Compensations: Minimize environmental distractions;Slow rate;Small sips/bites Postural Changes: Seated upright at 90  degrees    Other  Recommendations Oral Care Recommendations: Oral care BID;Staff/trained caregiver to provide oral care   Follow up Recommendations 24 hour supervision/assistance;Skilled  Nursing facility      Frequency and Duration min 2x/week  2 weeks       Prognosis Prognosis for Safe Diet Advancement: Fair Barriers to Reach Goals: Cognitive deficits      Swallow Study   General Date of Onset: 09/01/19 HPI: [name] is a 85 y.o. female with medical history significant for dementia with behavioral disturbance, paroxysmal atrial fibrillation s/p PPM not on anticoagulation, diet-controlled type 2 diabetes, hypertension, hyperlipidemia, anxiety/depression who is currently under hospice care presents to the ED for evaluation of worsening lethargy/generalized weakness, decreased oral intake, and worsening confusion.  No previously documented dysphagia hx.  Type of Study: Bedside Swallow Evaluation Previous Swallow Assessment: None  Diet Prior to this Study: Regular;Thin liquids Temperature Spikes Noted: No Respiratory Status: Room air History of Recent Intubation: No Behavior/Cognition: Confused;Agitated;Lethargic/Drowsy;Doesn't follow directions Oral Cavity Assessment: Other (comment)(Unable to evaluate ) Oral Care Completed by SLP: No Oral Cavity - Dentition: Other (Comment)(Unable to evaluate ) Self-Feeding Abilities: Total assist Patient Positioning: Upright in bed Baseline Vocal Quality: Normal Volitional Cough: Cognitively unable to elicit Volitional Swallow: Unable to elicit    Oral/Motor/Sensory Function Overall Oral Motor/Sensory Function: Other (comment)(Unable to evaluate )   Ice Chips Ice chips: Not tested   Thin Liquid Thin Liquid: Within functional limits Presentation: Straw    Nectar Thick Nectar Thick Liquid: Not tested   Honey Thick Honey Thick Liquid: Not tested   Puree Puree: Impaired Presentation: Spoon Oral Phase Functional Implications: Prolonged oral transit   Solid     Solid: Impaired Presentation: Spoon Oral Phase Impairments: Poor awareness of bolus Oral Phase Functional Implications: Oral holding Other Comments: Held  cracker between her lips without moving it posteriorly to her oral cavity for mastication     Colin Mulders M.S., CCC-SLP Acute Rehabilitation Services Office: [phone]  Elvia Collum Gwenda Heiner 09/01/2019,9:30 AM

## 2019-09-01 NOTE — Evaluation (Signed)
Occupational Therapy Evaluation Patient Details Name: [name] Bakula MRN: [id number] DOB: [birth year] Today's Date: 09/01/2019    History of Present Illness [name] Soldo is a 85 y.o. female with medical history significant for dementia with behavioral disturbance, paroxysmal atrial fibrillation s/p PPM not on anticoagulation, diet-controlled type 2 diabetes, hypertension, hyperlipidemia, anxiety/depression who is currently under hospice care presents to the ED for evaluation of worsening lethargy/generalized weakness, decreased oral intake, and worsening confusion.   Clinical Impression   Patient lives with her son who is her primary caregiver and requires assistance for all mobility and ADL's at baseline. Per chart review patient has had increased weakness over last few weeks and is now requiring greater assistance; currently total A for all ADLs/selcare. No further acute OT is indicated at this time, recommend SNF/LTC post acute d/c    Follow Up Recommendations  SNF;Supervision/Assistance - 24 hour    Equipment Recommendations  None recommended by OT    Recommendations for Other Services       Precautions / Restrictions Precautions Precautions: Fall Restrictions Weight Bearing Restrictions: No      Mobility Bed Mobility Overal bed mobility: Needs Assistance Bed Mobility: Supine to Sit;Sit to Supine     Supine to sit: Min assist;HOB elevated;+2 for safety/equipment Sit to supine: Max assist;HOB elevated;+2 for safety/equipment   General bed mobility comments: pt able to initiate supine to sit transfers and impulsive with attempting to sit up to EOB with bed rail up. pt required cues for safety to slow transfer and guide LE mobility. Pt able to initiate press up to raise trunk however required min assist to steady and complete rise. Pt required max assist +2 for sit to supine transfer to control trunk lowering and raise LE's into bed.  Transfers Overall transfer level:  Needs assistance Equipment used: 2 person hand held assist Transfers: Sit to/from Stand Sit to Stand: Min assist;+2 safety/equipment;Max assist;+2 physical assistance         General transfer comment: pt performed 5 sit<>stand transfers with 2+ HHA. Pt required varying degrees of assist from min-max throughout. pt using therapist forearm for support to pull up and initaite rise to stand. Pt able to maintain stand for ~5-15 seconds and bil LE's giving out with max assist to sit back on bed.    Balance Overall balance assessment: Needs assistance Sitting-balance support: Bilateral upper extremity supported;Feet supported Sitting balance-Leahy Scale: Poor Sitting balance - Comments: pt with significant Rt lean initaitlly and Lt lean intermittently with sitting EOB Postural control: Right lateral lean;Left lateral lean Standing balance support: Bilateral upper extremity supported;During functional activity Standing balance-Leahy Scale: Zero                             ADL either performed or assessed with clinical judgement   ADL                                         General ADL Comments: Total A . pt requires extensive assist at baseline for selfcare/ADLs     Vision Baseline Vision/History: (pt unable to state) Patient Visual Report: (unable to properly assess due to cognition)       Perception     Praxis      Pertinent Vitals/Pain Pain Assessment: Faces Faces Pain Scale: Hurts little more Pain Location: generalized during repositioning in bed  Pain Descriptors / Indicators: Guarding Pain Intervention(s): Monitored during session;Repositioned     Hand Dominance Right(??)   Extremity/Trunk Assessment Upper Extremity Assessment Upper Extremity Assessment: Generalized weakness   Lower Extremity Assessment Lower Extremity Assessment: Defer to PT evaluation   Cervical / Trunk Assessment Cervical / Trunk Assessment: Kyphotic    Communication Communication Communication: Expressive difficulties;Other (comment)(minimally verbal, incoherent speeech, did not answer questions)   Cognition Arousal/Alertness: Lethargic Behavior During Therapy: Restless;Impulsive Overall Cognitive Status: History of cognitive impairments - at baseline                                 General Comments: pt with history of dementia and unable to communicate with simple questions. minimally verbal throughout and disoriented calling out for her "mama and daddy" a couple occasions during session. Pt reaching for objects that were not present during session. She was impulsive with attempting to sit up in bed and stand. Pt unable to follow simple 1 step commands.   General Comments       Exercises     Shoulder Instructions      Home Living Family/patient expects to be discharged to:: Unsure Living Arrangements: Children                               Additional Comments: per chart review pt lives with son and he is her primary caregiver; attempted to call pt's son and unable to get through to him on mobile phone.      Prior Functioning/Environment Level of Independence: Needs assistance  Gait / Transfers Assistance Needed: per chart review it appears pt was able to transfer to wheelchai ADL's / Homemaking Assistance Needed: pt lives with son, appears per chart he assists with all ADL's and she has a hospice RN come into home 1x/week Communication / Swallowing Assistance Needed: pt's son has been feeding her          OT Problem List: Decreased strength;Impaired balance (sitting and/or standing);Decreased cognition;Decreased knowledge of precautions;Decreased activity tolerance;Decreased knowledge of use of DME or AE      OT Treatment/Interventions:      OT Goals(Current goals can be found in the care plan section) Acute Rehab OT Goals Patient Stated Goal: none stated OT Goal Formulation: Patient unable to  participate in goal setting  OT Frequency:     Barriers to D/C: Decreased caregiver support          Co-evaluation PT/OT/SLP Co-Evaluation/Treatment: Yes Reason for Co-Treatment: For patient/therapist safety;To address functional/ADL transfers PT goals addressed during session: Mobility/safety with mobility;Balance OT goals addressed during session: ADL's and self-care      AM-PAC OT "6 Clicks" Daily Activity     Outcome Measure Help from another person eating meals?: Total Help from another person taking care of personal grooming?: Total Help from another person toileting, which includes using toliet, bedpan, or urinal?: Total Help from another person bathing (including washing, rinsing, drying)?: Total Help from another person to put on and taking off regular upper body clothing?: Total Help from another person to put on and taking off regular lower body clothing?: Total 6 Click Score: 6   End of Session Equipment Utilized During Treatment: Gait belt Nurse Communication: Mobility status  Activity Tolerance: Patient limited by fatigue Patient left: in bed;with call bell/phone within reach;with bed alarm set;with nursing/sitter in room  OT Visit Diagnosis: Unsteadiness on  feet (R26.81);Other abnormalities of gait and mobility (R26.89);History of falling (Z91.81);Muscle weakness (generalized) (M62.81);Other symptoms and signs involving cognitive function                Time: 4562-5638 OT Time Calculation (min): 22 min Charges:  OT General Charges $OT Visit: 1 Visit OT Evaluation $OT Eval Low Complexity: 1 Low    Britt Bottom 09/01/2019, 1:16 PM

## 2019-09-01 NOTE — ED Notes (Signed)
Patient is resting comfortably. 

## 2019-09-01 NOTE — ED Notes (Signed)
Attempted to call report x2

## 2019-09-01 NOTE — Evaluation (Signed)
Physical Therapy Evaluation Patient Details Name: [name] MRN: [id number] DOB: 08-Jul-1923 Today's Date: 09/01/2019   History of Present Illness  [name] is a 85 y.o. female with medical history significant for dementia with behavioral disturbance, paroxysmal atrial fibrillation s/p PPM not on anticoagulation, diet-controlled type 2 diabetes, hypertension, hyperlipidemia, anxiety/depression who is currently under hospice care presents to the ED for evaluation of worsening lethargy/generalized weakness, decreased oral intake, and worsening confusion.    Clinical Impression  [name] is 85 y.o. female admitted with above HPI and diagnosis. Patient is currently limited by functional impairments below (see PT problem list). Patient lives with her son who is her primary caregiver and requires assistance for all mobility and ADL's at baseline. Per chart review patient has had increased weakness over last few weeks and is now requiring greater assistance. Patient will benefit from continued skilled PT interventions to address impairments and progress mobility, recommending SNF and 24/7 assistance. Acute PT will follow and progress as able.    Follow Up Recommendations SNF;Supervision/Assistance - 24 hour    Equipment Recommendations  None recommended by PT    Recommendations for Other Services       Precautions / Restrictions Precautions Precautions: Fall Restrictions Weight Bearing Restrictions: No      Mobility  Bed Mobility Overal bed mobility: Needs Assistance Bed Mobility: Supine to Sit;Sit to Supine     Supine to sit: Min assist;HOB elevated;+2 for safety/equipment Sit to supine: Max assist;HOB elevated;+2 for safety/equipment   General bed mobility comments: pt able to initiate supine to sit transfers and impulsive with attempting to sit up to EOB with bed rail up. pt required cues for safety to slow transfer and guide LE mobility. Pt able to initiate  press up to raise trunk however required min assist to steady and complete rise. Pt required max assist +2 for sit to supine transfer to control trunk lowering and raise LE's into bed.  Transfers Overall transfer level: Needs assistance Equipment used: 2 person hand held assist Transfers: Sit to/from Stand Sit to Stand: Min assist;+2 safety/equipment;Max assist;+2 physical assistance         General transfer comment: pt performed 5 sit<>stand transfers with 2+ HHA. Pt required varying degrees of assist from min-max throughout. pt using therapist forearm for support to pull up and initaite rise to stand. Pt able to maintain stand for ~5-15 seconds and bil LE's giving out with max assist to sit back on bed.  Ambulation/Gait Ambulation/Gait assistance: Mod assist;+2 physical assistance;+2 safety/equipment;Max assist Gait Distance (Feet): 3 Feet Assistive device: 2 person hand held assist Gait Pattern/deviations: Decreased stride length;Decreased step length - left;Decreased step length - right;Step-to pattern;Narrow base of support;Shuffle;Trunk flexed Gait velocity: slow and unsteady   General Gait Details: pt performed 2x side stepping alon EOB with 2+ HHA for safety and to maintain balance. Pt required tactile/verbal cues to sequence and initaite side steps. Limited by LE weakness as pt's bil LE's tended to give out from under her.  Stairs            Wheelchair Mobility    Modified Rankin (Stroke Patients Only)       Balance Overall balance assessment: Needs assistance Sitting-balance support: Bilateral upper extremity supported;Feet supported Sitting balance-Leahy Scale: Poor Sitting balance - Comments: pt with significant Rt lean initaitlly and Lt lean intermittently with sitting EOB Postural control: Right lateral lean;Left lateral lean Standing balance support: Bilateral upper extremity supported;During functional activity Standing balance-Leahy Scale: Zero  Pertinent Vitals/Pain Pain Assessment: Faces Faces Pain Scale: Hurts little more Pain Location: generalized during repositioning in bed  Pain Descriptors / Indicators: Guarding Pain Intervention(s): Monitored during session;Repositioned    Home Living Family/patient expects to be discharged to:: Unsure(SNF/ALF) Living Arrangements: Children               Additional Comments: per chart review pt lives with son and he is her primary caregiver; attempted to call pt's son and unable to get through to him on mobile phone.    Prior Function Level of Independence: Needs assistance   Gait / Transfers Assistance Needed: per chart review it appears pt was able to transfer to wheelchai  ADL's / Homemaking Assistance Needed: pt lives with son, appears per chart he assists with all ADL's and she has a hospice RN come into home 1x/week        Hand Dominance        Extremity/Trunk Assessment   Upper Extremity Assessment Upper Extremity Assessment: Defer to OT evaluation    Lower Extremity Assessment Lower Extremity Assessment: Generalized weakness    Cervical / Trunk Assessment Cervical / Trunk Assessment: Kyphotic  Communication   Communication: Expressive difficulties;Other (comment)(pt minimally verbal with no clear words, unable to respond to simple questions)  Cognition Arousal/Alertness: Lethargic Behavior During Therapy: Restless;Impulsive Overall Cognitive Status: History of cognitive impairments - at baseline            General Comments: pt with history of dementia and unable to communicate with simple questions. minimally verbal throughout and disoriented calling out for her "mama and daddy" a couple occasions during session. Pt reaching for objects that were not present during session. She was impulsive with attempting to sit up in bed and stand. Pt unable to follow simple 1 step commands.      General Comments      Exercises     Assessment/Plan     PT Assessment Patient needs continued PT services  PT Problem List Decreased strength;Decreased activity tolerance;Decreased mobility;Decreased balance;Decreased safety awareness       PT Treatment Interventions DME instruction;Functional mobility training;Balance training;Patient/family education;Therapeutic activities;Gait training;Therapeutic exercise    PT Goals (Current goals can be found in the Care Plan section)  Acute Rehab PT Goals PT Goal Formulation: Patient unable to participate in goal setting Time For Goal Achievement: 09/15/19 Potential to Achieve Goals: Fair    Frequency Min 2X/week    AM-PAC PT "6 Clicks" Mobility  Outcome Measure Help needed turning from your back to your side while in a flat bed without using bedrails?: A Lot Help needed moving from lying on your back to sitting on the side of a flat bed without using bedrails?: A Lot Help needed moving to and from a bed to a chair (including a wheelchair)?: Total Help needed standing up from a chair using your arms (e.g., wheelchair or bedside chair)?: Total Help needed to walk in hospital room?: Total Help needed climbing 3-5 steps with a railing? : Total 6 Click Score: 8    End of Session Equipment Utilized During Treatment: Gait belt Activity Tolerance: Patient limited by fatigue Patient left: in bed;with nursing/sitter in room;with call bell/phone within reach Nurse Communication: Mobility status PT Visit Diagnosis: Muscle weakness (generalized) (M62.81);Unsteadiness on feet (R26.81);Difficulty in walking, not elsewhere classified (R26.2)    Time: [id number] PT Time Calculation (min) (ACUTE ONLY): 18 min   Charges:   PT Evaluation $PT Eval Low Complexity: 1 Low  Renaldo Fiddler PT, DPT Physical Therapist with [facility]  09/01/2019 1:10 PM

## 2019-09-01 NOTE — TOC Initial Note (Signed)
Transition of Care [facility]) - Initial/Assessment Note    Patient Details  Name: [name] MRN: [id number] Date of Birth: December 17, 1922  Transition of Care [facility]) CM/SW Contact:    Bartholome Bill, RN Phone Number: 09/01/2019, 3:23 PM  Clinical Narrative:                 Pt from home with [facility] hospice. Son cares for her at home. This CM spoke with son who states that he can not care for her at home anymore. Pt would not qualify for SNF due to not being SNF appropriate.  [facility] liaison Alvino Chapel contacted about need for additional help at home verses residential hospice availability. This CM was alerted that [facility] does not have the ability to house pt in a residential facility. Liaison to contact son to see if he wants to continue home care or discontinue services with [facility] and pursue another plan of care for the patient. MD states that pt is residential hospice appropriate at this time and will speak with son about it today. TOC will continue to follow and assist with dc planning.  Expected Discharge Plan: Hospice Medical Facility Barriers to Discharge: Unsafe home situation   Patient Goals and CMS Choice        Expected Discharge Plan and Services Expected Discharge Plan: Hospice Medical Facility   Discharge Planning Services: CM Consult    Prior Living Arrangements/Services   Lives with:: Adult Children            Care giver support system in place?: No (comment)(Not adequate support) Current home services: Other (comment)([facility])    Activities of Daily Living Home Assistive Devices/Equipment: Walker (specify type), CBG Meter(front wheeled walker) ADL Screening (condition at time of admission) Patient's cognitive ability adequate to safely complete daily activities?: No Is the patient deaf or have difficulty hearing?: No Does the patient have difficulty seeing, even when wearing glasses/contacts?: No Does the patient have difficulty concentrating,  remembering, or making decisions?: Yes Patient able to express need for assistance with ADLs?: No Does the patient have difficulty dressing or bathing?: Yes Independently performs ADLs?: No Communication: Independent Dressing (OT): Dependent Is this a change from baseline?: Change from baseline, expected to last >3 days Grooming: Dependent Is this a change from baseline?: Change from baseline, expected to last >3 days Feeding: Dependent Is this a change from baseline?: Change from baseline, expected to last >3 days Bathing: Dependent Is this a change from baseline?: Change from baseline, expected to last >3 days Toileting: Dependent Is this a change from baseline?: Change from baseline, expected to last >3days In/Out Bed: Dependent Is this a change from baseline?: Change from baseline, expected to last >3 days Walks in Home: Dependent Is this a change from baseline?: Change from baseline, expected to last >3 days Does the patient have difficulty walking or climbing stairs?: Yes(secondary to weakness.  patient has had falls at home) Weakness of Legs: Both Weakness of Arms/Hands: None  Permission Sought/Granted                  Emotional Assessment              Admission diagnosis:  Generalized weakness [R53.1] Community acquired pneumonia of left lower lobe of lung [J18.9] Patient Active Problem List   Diagnosis Date Noted  . Generalized weakness 08/31/2019  . Osteoarthritis of both knees 07/15/2018  . High risk medication use 06/02/2018  . Syncope 05/27/2018  . Adjustment disorder with mixed disturbance of emotions  and conduct 11/23/2017  . UTI (urinary tract infection) 08/19/2017  . Dementia with behavioral disturbance ([place]) 08/19/2017  . Anxiety 10/27/2012  . Symptomatic bradycardia 10/01/2011  . Status post cardiac pacemaker procedure 10/01/2011  . Paroxysmal atrial fibrillation ([place]) 09/29/2011  . Pain disorder 04/19/2011  . Chest pain, atypical 02/20/2011   . Depression, major 09/19/2010  . LOSS OF WEIGHT 05/30/2010  . ANEMIA, DEFICIENCY, HX OF 03/01/2010  . INSOMNIA 11/23/2008  . Dyslipidemia 03/17/2008  . Type 2 diabetes mellitus with neurological complications ([place]) 33/29/5188  . Essential hypertension 05/18/2007  . GERD 05/18/2007   PCP:  Lynnell Jude, MD Pharmacy:   [facility], [place] [address] Phone: [phone] Fax: [phone]     Social Determinants of Health (SDOH) Interventions    Readmission Risk Interventions No flowsheet data found.

## 2019-09-01 NOTE — Progress Notes (Signed)
PROGRESS NOTE  [name] UEK:[id number] DOB: April 17, 1923 DOA: 08/31/2019 PCP: Lynnell Jude, MD  Cindy/Recap of past 24 hours: [name] is a 85 y.o. female with medical history significant for dementia with behavioral disturbance, paroxysmal atrial fibrillation s/p PPM not on anticoagulation, diet-controlled type 2 diabetes, hypertension, hyperlipidemia, anxiety/depression who is currently under hospice care presents to the ED for evaluation of worsening lethargy/generalized weakness, decreased oral intake, and worsening confusion.  Patient is unable to provide any history on her own therefore entirety history is obtained from [place], chart review, and son by phone. Patient currently lives with son and has a hospice nurse visit every Wednesday.  Patient's son states that she has advanced dementia and at her baseline can occasionally be set up in wheelchair and will recognize him.  She has had notable decline in function and he has been unable to get her up into a chair, to the bathroom, or help feed her.  She has not been eating and is less interactive than usual.  She has had some falls at home which are described as if she just collapses to the ground but has not had any significant injury. In the ED, VS fairly stable, labs somewhat unremarkable, UA with negative nitrites/leukocytes Radiology, COVID-19 negative.  CT head no acute intracranial abnormalities, portable chest x-ray shows pacemaker in place, with possible left lower lobe atelectasis/pneumonia with small left effusion.  Patient admitted for further management.     Today, patient noted to be somewhat restless, awake, not oriented, unable to have any meaningful conversation.  Unable to perform ROS.   Assessment/Plan: Principal Problem:   Generalized weakness Active Problems:   Type 2 diabetes mellitus with neurological complications (HCC)   Dyslipidemia   Essential hypertension   Paroxysmal atrial  fibrillation (HCC)   Dementia with behavioral disturbance (HCC)   Failure to thrive/generalized weakness/falls in the setting of advanced dementia Presentation likely due to progression of her advanced dementia, poor oral intake Less likely infection related, currently afebrile, no leukocytosis on presentation Procalcitonin negative UA unremarkable Chest x-ray with possible left lower lobe atelectasis versus pneumonia No further antibiotics needed Fall precautions, safety sitter PT/OT/SLP Patient will benefit from residential hospice Transitions of care team on board  Paroxysmal A. fib status post pacemaker Currently rate controlled Not on any anticoagulation due to fall risk  Hypertension Continue home lisinopril  Hyperlipidemia Continue simvastatin  Diabetes mellitus type 2 SSI, Accu-Cheks, hypoglycemic protocol  Advanced dementia with behavioral disturbance Continue Seroquel, Zoloft, hold home Ativan Continue delirium precautions Due to likely progression of advanced dementia, failure to thrive, patient will benefit from residential hospice  [place] Patient is currently DNR, residential hospice recommended as patient is unsafe to return home and son currently unable to further take care of her increasing demands        Malnutrition Type:      Malnutrition Characteristics:      Nutrition Interventions:       Estimated body mass index is 22.83 kg/m as calculated from the following:   Height as of this encounter: 5\' 8"  (1.727 m).   Weight as of this encounter: 68.1 kg.     Code Status: DNR  Family Communication: None at bedside  Disposition Plan: Plan for residential hospice   Consultants:  None  Procedures:  None  Antimicrobials:  None  DVT prophylaxis: Lovenox   Objective: Vitals:   09/01/19 0434 09/01/19 0552 09/01/19 0616 09/01/19 1325  BP: (!) 144/93  121/71 133/87  Pulse: 100  98 98  Resp: 16   14  Temp:   97.8 F (36.6 C)  98.3 F (36.8 C)  TempSrc:   Axillary Oral  SpO2: 100%  99% 98%  Weight:  68.1 kg    Height:        Intake/Output Summary (Last 24 hours) at 09/01/2019 1516 Last data filed at 09/01/2019 1254 Gross per 24 hour  Intake 0 ml  Output 100 ml  Net -100 ml   Filed Weights   08/31/19 1613 09/01/19 0552  Weight: 65.8 kg 68.1 kg    Exam:  General: NAD, chronically ill-appearing, lethargic, deconditioned, not oriented to self place or time  Cardiovascular: S1, S2 present  Respiratory: CTAB  Abdomen: Soft, nontender, nondistended, bowel sounds present  Musculoskeletal: No bilateral pedal edema noted, moving extremities spontaneously  Skin: Normal  Psychiatry:  Unable to assess   Data Reviewed: CBC: Recent Labs  Lab 08/31/19 1654 09/01/19 0444  WBC 9.9 10.8*  NEUTROABS 8.1*  --   HGB 13.8 14.1  HCT 45.0 46.2*  MCV 90.9 92.0  PLT 227 209   Basic Metabolic Panel: Recent Labs  Lab 08/31/19 1654 09/01/19 0444  NA 142 138  K 4.2 3.8  CL 103 102  CO2 25 23  GLUCOSE 92 84  BUN 22 21  CREATININE 1.04* 0.94  CALCIUM 9.1 9.0   GFR: Estimated Creatinine Clearance: 36.9 mL/min (by C-G formula based on SCr of 0.94 mg/dL). Liver Function Tests: Recent Labs  Lab 08/31/19 1654  AST 16  ALT 14  ALKPHOS 47  BILITOT 1.0  PROT 7.8  ALBUMIN 3.9   Recent Labs  Lab 08/31/19 1654  LIPASE 23   No results for input(s): AMMONIA in the last 168 hours. Coagulation Profile: No results for input(s): INR, PROTIME in the last 168 hours. Cardiac Enzymes: No results for input(s): CKTOTAL, CKMB, CKMBINDEX, TROPONINI in the last 168 hours. BNP (last 3 results) No results for input(s): PROBNP in the last 8760 hours. HbA1C: No results for input(s): HGBA1C in the last 72 hours. CBG: No results for input(s): GLUCAP in the last 168 hours. Lipid Profile: No results for input(s): CHOL, HDL, LDLCALC, TRIG, CHOLHDL, LDLDIRECT in the last 72 hours. Thyroid Function Tests: No  results for input(s): TSH, T4TOTAL, FREET4, T3FREE, THYROIDAB in the last 72 hours. Anemia Panel: No results for input(s): VITAMINB12, FOLATE, FERRITIN, TIBC, IRON, RETICCTPCT in the last 72 hours. Urine analysis:    Component Value Date/Time   COLORURINE YELLOW 08/31/2019 1815   APPEARANCEUR CLEAR 08/31/2019 1815   LABSPEC 1.024 08/31/2019 1815   PHURINE 5.0 08/31/2019 1815   GLUCOSEU NEGATIVE 08/31/2019 1815   HGBUR SMALL (A) 08/31/2019 1815   HGBUR negative 04/19/2010 1422   BILIRUBINUR NEGATIVE 08/31/2019 1815   BILIRUBINUR 1+ 06/24/2012 1447   KETONESUR 20 (A) 08/31/2019 1815   PROTEINUR NEGATIVE 08/31/2019 1815   UROBILINOGEN 0.2 10/18/2014 2027   NITRITE NEGATIVE 08/31/2019 1815   LEUKOCYTESUR NEGATIVE 08/31/2019 1815   Sepsis Labs: @LABRCNTIP (procalcitonin:4,lacticidven:4)  ) Recent Results (from the past 240 hour(s))  SARS CORONAVIRUS 2 (TAT 6-24 HRS) Nasopharyngeal Nasopharyngeal Swab     Status: None   Collection Time: 08/31/19  4:41 PM   Specimen: Nasopharyngeal Swab  Result Value Ref Range Status   SARS Coronavirus 2 NEGATIVE NEGATIVE Final    Comment: (NOTE) SARS-CoV-2 target nucleic acids are NOT DETECTED. The SARS-CoV-2 RNA is generally detectable in upper and lower respiratory specimens during the acute phase  of infection. Negative results do not preclude SARS-CoV-2 infection, do not rule out co-infections with other pathogens, and should not be used as the sole basis for treatment or other patient management decisions. Negative results must be combined with clinical observations, patient history, and epidemiological information. The expected result is Negative. Fact Sheet for Patients: HairSlick.no Fact Sheet for Healthcare Providers: quierodirigir.com This test is not yet approved or cleared by the [place] FDA and  has been authorized for detection and/or diagnosis of SARS-CoV-2 by FDA under  an Emergency Use Authorization (EUA). This EUA will remain  in effect (meaning this test can be used) for the duration of the COVID-19 declaration under Section 56 4(b)(1) of the Act, 21 U.S.C. section 360bbb-3(b)(1), unless the authorization is terminated or revoked sooner. Performed at [facility], [address] [place], [place] [address]       Studies: CT Head Wo Contrast  Result Date: 08/31/2019 CLINICAL DATA:  Altered mental status. EXAM: CT HEAD WITHOUT CONTRAST TECHNIQUE: Contiguous axial images were obtained from the base of the skull through the vertex without intravenous contrast. COMPARISON:  CT head dated August 24, 2018. FINDINGS: The study is limited by motion artifact. Brain: No evidence of acute infarction, hemorrhage, hydrocephalus, extra-axial collection or mass lesion/mass effect. Stable atrophy and chronic microvascular ischemic changes. Old lacunar infarcts in the left thalamus and basal ganglia again noted. Vascular: Atherosclerotic vascular calcification of the carotid siphons. No hyperdense vessel. Skull: Negative for fracture or focal lesion. Sinuses/Orbits: No acute finding. Other: None. IMPRESSION: 1. Motion limited study.  No acute intracranial abnormality. 2. Stable atrophy and chronic microvascular ischemic changes. Electronically Signed   By: Obie Dredge M.D.   On: 08/31/2019 18:15   DG Chest Port 1 View  Result Date: 08/31/2019 CLINICAL DATA:  Weakness and altered mental status. EXAM: PORTABLE CHEST 1 VIEW COMPARISON:  08/24/2018 FINDINGS: Dual lead pacemaker appears the same. Chronic cardiomegaly. Right chest remains clear. Question of volume loss and pneumonia in the left lower lobe. Small amount of pleural fluid probably present on the left as well. IMPRESSION: Suspicion of left lower lobe atelectasis/pneumonia with a small left effusion. Electronically Signed   By: Paulina Fusi M.D.   On: 08/31/2019 17:49    Scheduled Meds: . divalproex  250  mg Oral TID  . enoxaparin (LOVENOX) injection  40 mg Subcutaneous QHS  . famotidine  40 mg Oral Daily  . lisinopril  10 mg Oral Daily  . QUEtiapine  25 mg Oral BID  . sertraline  50 mg Oral Daily  . simvastatin  40 mg Oral q1800    Continuous Infusions:   LOS: 0 days     Briant Cedar, MD [facility]  If 7PM-7AM, please contact night-coverage [email or web address] 09/01/2019, 3:16 PM

## 2019-09-02 DIAGNOSIS — E785 Hyperlipidemia, unspecified: Secondary | ICD-10-CM

## 2019-09-02 MED ORDER — LORAZEPAM 1 MG PO TABS: 1.0000 mg | ORAL_TABLET | ORAL | 0 refills | Status: AC | PRN

## 2019-09-02 NOTE — Progress Notes (Addendum)
Call made to [facility] in [place]. Spoke with Leana Roe and gave her report on patient. Questions answered and Leana Roe denies further questions. TOC called [facility]; awaiting [facility] for transportation to facility. Donne Hazel, RN

## 2019-09-02 NOTE — NC FL2 (Signed)
[place] MEDICAID FL2 LEVEL OF CARE SCREENING TOOL     IDENTIFICATION  Patient Name: [name] Birthdate: December 04, 1922 Sex: female Admission Date (Current Location): 08/31/2019  [facility] and [place] Number:  Producer, television/film/video and Address:  [facility],  [address] [place], [place] [address]      Provider Number: [id number]  Attending Physician Name and Address:  Briant Cedar, MD  Relative Name and Phone Number:  Marqueta Pulley [phone]    Current Level of Care: Hospital Recommended Level of Care: Nursing Facility Prior Approval Number:    Date Approved/Denied:   PASRR Number: [id number]  Discharge Plan: SNF    Current Diagnoses: Patient Active Problem List   Diagnosis Date Noted  . Generalized weakness 08/31/2019  . Osteoarthritis of both knees 07/15/2018  . High risk medication use 06/02/2018  . Syncope 05/27/2018  . 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 UTI (urinary tract infection) 08/19/2017  . Dementia with behavioral disturbance (HCC) 08/19/2017  . Anxiety 10/27/2012  . Symptomatic bradycardia 10/01/2011  . Status post cardiac pacemaker procedure 10/01/2011  . Paroxysmal atrial fibrillation (HCC) 09/29/2011  . Pain disorder 04/19/2011  . Chest pain, atypical 02/20/2011  . Depression, major 09/19/2010  . LOSS OF WEIGHT 05/30/2010  . ANEMIA, DEFICIENCY, HX OF 03/01/2010  . INSOMNIA 11/23/2008  . Dyslipidemia 03/17/2008  . 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 Essential hypertension 05/18/2007  . GERD 05/18/2007    Orientation RESPIRATION BLADDER Height & Weight     (arouses to verbal stimuli)  Normal Incontinent Weight: 68.1 kg Height:  5\' 8"  (172.7 cm)  BEHAVIORAL SYMPTOMS/MOOD NEUROLOGICAL BOWEL NUTRITION STATUS      Incontinent    AMBULATORY STATUS COMMUNICATION OF NEEDS Skin   Extensive Assist (arouses to verbal stimuli) Normal                        Personal Care Assistance Level of Assistance  Bathing, Feeding, Dressing Bathing Assistance: Maximum assistance Feeding assistance: Maximum assistance Dressing Assistance: Maximum assistance     Functional Limitations Info             SPECIAL CARE FACTORS FREQUENCY                       Contractures      Additional Factors Info  Code Status, Allergies Code Status Info: DNR Allergies Info: Amoxicillin, Penicillin           Current Medications (09/02/2019):  This is the current hospital active medication list Current Facility-Administered Medications  Medication Dose Route Frequency Provider Last Rate Last Admin  . acetaminophen (TYLENOL) tablet 650 mg  650 mg Oral Q6H PRN 09/04/2019, MD       Or  . acetaminophen (TYLENOL) suppository 650 mg  650 mg Rectal Q6H PRN Charlsie Quest R, MD      . divalproex (DEPAKOTE) DR tablet 250 mg  250 mg Oral TID Darreld Mclean, MD   250 mg at 09/02/19 09/04/19  . enoxaparin (LOVENOX) injection 40 mg  40 mg Subcutaneous QHS 4665, MD   40 mg at 08/31/19 2248  . famotidine (PEPCID) tablet 40 mg  40 mg Oral Daily 2249, MD   40 mg at 09/02/19 09/04/19  . lisinopril (ZESTRIL) tablet 10 mg  10 mg Oral Daily 9935, MD   10 mg at 09/02/19  7282  . QUEtiapine (SEROQUEL) tablet 25 mg  25 mg Oral BID Lenore Cordia, MD   25 mg at 09/02/19 0601  . sertraline (ZOLOFT) tablet 50 mg  50 mg Oral Daily Lenore Cordia, MD   50 mg at 09/02/19 5615  . simvastatin (ZOCOR) tablet 40 mg  40 mg Oral q1800 Lenore Cordia, MD         Discharge Medications: Please see discharge summary for a list of discharge medications.  Relevant Imaging Results:  Relevant Lab Results:   Additional Information SSN- [id number]  Teaghan Formica, Marjie Skiff, RN

## 2019-09-02 NOTE — Discharge Summary (Addendum)
Discharge Summary  [name]:[id number] DOB: 1922-12-16  PCP: Lynnell Jude, MD  Admit date: 08/31/2019 Discharge date: 09/02/2019  Time spent: 40 mins  Recommendations for Outpatient Follow-up:  1. Patient will be followed by hospice services at SNF  Discharge Diagnoses:  Active Hospital Problems   Diagnosis Date Noted   Generalized weakness 08/31/2019   Dementia with behavioral disturbance ([place]) 08/19/2017   Paroxysmal atrial fibrillation ([place]) 09/29/2011   Dyslipidemia 03/17/2008   Essential hypertension 05/18/2007   Type 2 diabetes mellitus with neurological complications ([place]) 44/09/270    Resolved Hospital Problems  No resolved problems to display.    Discharge Condition: Fair  Diet recommendation: As tolerated  Vitals:   09/02/19 0658 09/02/19 1329  BP: 129/75 (!) 144/68  Pulse: 83 71  Resp: 17 16  Temp: (!) 97.3 F (36.3 C) 98.6 F (37 C)  SpO2: 98% 96%    History of present illness:  [name] a 85 y.o.femalewith medical history significant fordementia with behavioral disturbance, paroxysmal atrial fibrillation s/p PPMnot on anticoagulation, diet-controlled type 2 diabetes, hypertension, hyperlipidemia, anxiety/depression who is currently under hospice care presents to the ED for evaluation ofworsening lethargy/generalized weakness, decreased oral intake, and worsening confusion. Patient is unable to provide any history on her own therefore entirety history is obtained from [place], chart review, and son by phone. Patient currently lives with son and has a hospice nurse visit every Wednesday. Patient's son states that she has advanced dementia and at her baseline can occasionally be set up in wheelchair and will recognize him. She has had notable decline in function and he has been unable to get her up into a chair, to the bathroom, or help feed her. She has not been eating and is less interactive than usual. She has had some  falls at home which are described as if she just collapses to the ground but has not had any significant injury. In the ED, VS fairly stable, labs somewhat unremarkable, UA with negative nitrites/leukocytes Radiology, COVID-19 negative.  CT head no acute intracranial abnormalities, portable chest x-ray shows pacemaker in place, with possible left lower lobe atelectasis/pneumonia with small left effusion.  Patient admitted for further management.    Today, met patient sleeping, easily arousable, does not communicate, not oriented, looks comfortable.  Unable to perform ROS  Hospital Course:  Principal Problem:   Generalized weakness Active Problems:   Type 2 diabetes mellitus with neurological complications (HCC)   Dyslipidemia   Essential hypertension   Paroxysmal atrial fibrillation (HCC)   Dementia with behavioral disturbance (HCC)   Failure to thrive/generalized weakness/falls in the setting of advanced dementia Presentation likely due to progression of her advanced dementia, poor oral intake Less likely infection related, currently afebrile, no leukocytosis on presentation Procalcitonin negative UA unremarkable Chest x-ray with possible left lower lobe atelectasis versus pneumonia No further antibiotics needed Fall precautions, safety sitter PT/OT/SLP Patient will be followed by hospice services at SNF  Paroxysmal A. fib status post pacemaker Currently rate controlled Not on any anticoagulation due to fall risk  Hypertension Continue home lisinopril  Hyperlipidemia Continue simvastatin  Diabetes mellitus type 2 Continue outpatient regimen  Advanced dementia with behavioral disturbance Continue Seroquel, Zoloft, Ativan as needed Due to likely progression of advanced dementia, failure to thrive, patient will benefit from hospice services  [name] Patient is currently DNR, hospice services at SNF is recommended as patient is unsafe to return home and son currently  unable to further take care of her  increasing demands           Malnutrition Type:      Malnutrition Characteristics:      Nutrition Interventions:      Estimated body mass index is 22.83 kg/m as calculated from the following:   Height as of this encounter: 5' 8"  (1.727 m).   Weight as of this encounter: 68.1 kg.    Procedures:  None  Consultations:  None  Discharge Exam: BP (!) 144/68 (BP Location: Right Arm)    Pulse 71    Temp 98.6 F (37 C) (Oral)    Resp 16    Ht 5' 8"  (1.727 m)    Wt 68.1 kg    SpO2 96%    BMI 22.83 kg/m   General: NAD, noncommunicative, disoriented, appears comfortable Cardiovascular: S1, S2 present Respiratory: CTA B  Discharge Instructions You were cared for by a hospitalist during your hospital stay. If you have any questions about your discharge medications or the care you received while you were in the hospital after you are discharged, you can call the unit and asked to speak with the hospitalist on call if the hospitalist that took care of you is not available. Once you are discharged, your primary care physician will handle any further medical issues. Please note that NO REFILLS for any discharge medications will be authorized once you are discharged, as it is imperative that you return to your primary care physician (or establish a relationship with a primary care physician if you do not have one) for your aftercare needs so that they can reassess your need for medications and monitor your lab values.  Discharge Instructions    Diet - low sodium heart healthy   Complete by: As directed    Increase activity slowly   Complete by: As directed      Allergies as of 09/02/2019      Reactions   Amoxicillin Rash   Penicillins Rash   Has patient had a PCN reaction causing immediate rash, facial/tongue/throat swelling, SOB or lightheadedness with hypotension: unknown Has patient had a PCN reaction causing severe rash involving mucus  membranes or skin necrosis: unkonwn Has patient had a PCN reaction that required hospitalization unknown Has patient had a PCN reaction occurring within the last 10 years: No If all of the above ansers are "NO", then may proceed with Cephalosporin use.      Medication List    STOP taking these medications   traMADol 50 MG tablet Commonly known as: ULTRAM     TAKE these medications   acetaminophen 500 MG tablet Commonly known as: TYLENOL Take 1,000 mg by mouth every 6 (six) hours as needed for moderate pain (pain). For pain   divalproex 250 MG DR tablet Commonly known as: DEPAKOTE Take 250 mg by mouth 3 (three) times daily.   famotidine 40 MG tablet Commonly known as: PEPCID Take 40 mg by mouth daily.   lisinopril 10 MG tablet Commonly known as: ZESTRIL Take 1 tablet (10 mg total) by mouth daily.   LORazepam 1 MG tablet Commonly known as: ATIVAN Take 1 tablet (1 mg total) by mouth every 4 (four) hours as needed for anxiety.   QUEtiapine 50 MG tablet Commonly known as: SEROQUEL TAKE (1/2) TABLET TWICE DAILY. What changed:   how much to take  how to take this  when to take this  additional instructions   sertraline 50 MG tablet Commonly known as: ZOLOFT TAKE 1 TABLET ONCE DAILY.  simvastatin 40 MG tablet Commonly known as: ZOCOR TAKE 1 TABLET ONCE DAILY AT 6 PM. What changed: See the new instructions.      Allergies  Allergen Reactions   Amoxicillin Rash   Penicillins Rash    Has patient had a PCN reaction causing immediate rash, facial/tongue/throat swelling, SOB or lightheadedness with hypotension: unknown Has patient had a PCN reaction causing severe rash involving mucus membranes or skin necrosis: unkonwn Has patient had a PCN reaction that required hospitalization unknown Has patient had a PCN reaction occurring within the last 10 years: No If all of the above ansers are "NO", then may proceed with Cephalosporin use.       The results of  significant diagnostics from this hospitalization (including imaging, microbiology, ancillary and laboratory) are listed below for reference.    Significant Diagnostic Studies: CT Head Wo Contrast  Result Date: 08/31/2019 CLINICAL DATA:  Altered mental status. EXAM: CT HEAD WITHOUT CONTRAST TECHNIQUE: Contiguous axial images were obtained from the base of the skull through the vertex without intravenous contrast. COMPARISON:  CT head dated August 24, 2018. FINDINGS: The study is limited by motion artifact. Brain: No evidence of acute infarction, hemorrhage, hydrocephalus, extra-axial collection or mass lesion/mass effect. Stable atrophy and chronic microvascular ischemic changes. Old lacunar infarcts in the left thalamus and basal ganglia again noted. Vascular: Atherosclerotic vascular calcification of the carotid siphons. No hyperdense vessel. Skull: Negative for fracture or focal lesion. Sinuses/Orbits: No acute finding. Other: None. IMPRESSION: 1. Motion limited study.  No acute intracranial abnormality. 2. Stable atrophy and chronic microvascular ischemic changes. Electronically Signed   By: Titus Dubin M.D.   On: 08/31/2019 18:15   DG Chest Port 1 View  Result Date: 08/31/2019 CLINICAL DATA:  Weakness and altered mental status. EXAM: PORTABLE CHEST 1 VIEW COMPARISON:  08/24/2018 FINDINGS: Dual lead pacemaker appears the same. Chronic cardiomegaly. Right chest remains clear. Question of volume loss and pneumonia in the left lower lobe. Small amount of pleural fluid probably present on the left as well. IMPRESSION: Suspicion of left lower lobe atelectasis/pneumonia with a small left effusion. Electronically Signed   By: Nelson Chimes M.D.   On: 08/31/2019 17:49   CUP PACEART REMOTE DEVICE CHECK  Result Date: 08/07/2019 Scheduled remote reviewed.  Normal device function.  Presenting rhythm AFlutter with rates 110-115bpm, burden 7%. Appears to be persistent, no clear conversion on AEGM despite  multiple AF episodes logged due to atrial (flutter) undersensing. NSVT/SVT episodes c/w bursts of AF/RVR. Not a candidate for [facility] per EMR.  Pt/son rescheduled virtual visit to discuss OAC/AF management, now scheduled for remote on 12/4 and visit with MD on 12/7. Remote routed for assessment of burden prior to OV per Epic/PA notes. Next remote 91 days- JBox, RN/CVRS   Microbiology: Recent Results (from the past 240 hour(s))  SARS CORONAVIRUS 2 (TAT 6-24 HRS) Nasopharyngeal Nasopharyngeal Swab     Status: None   Collection Time: 08/31/19  4:41 PM   Specimen: Nasopharyngeal Swab  Result Value Ref Range Status   SARS Coronavirus 2 NEGATIVE NEGATIVE Final    Comment: (NOTE) SARS-CoV-2 target nucleic acids are NOT DETECTED. The SARS-CoV-2 RNA is generally detectable in upper and lower respiratory specimens during the acute phase of infection. Negative results do not preclude SARS-CoV-2 infection, do not rule out co-infections with other pathogens, and should not be used as the sole basis for treatment or other patient management decisions. Negative results must be combined with clinical observations, patient history, and epidemiological  information. The expected result is Negative. Fact Sheet for Patients: SugarRoll.be Fact Sheet for Healthcare Providers: https://www.woods-mathews.com/ This test is not yet approved or cleared by the [place] FDA and  has been authorized for detection and/or diagnosis of SARS-CoV-2 by FDA under an Emergency Use Authorization (EUA). This EUA will remain  in effect (meaning this test can be used) for the duration of the COVID-19 declaration under Section 56 4(b)(1) of the Act, 21 U.S.C. section 360bbb-3(b)(1), unless the authorization is terminated or revoked sooner. Performed at [facility], [place] [address] [place], [place] [address]      Labs: Basic Metabolic Panel: Recent Labs  Lab 08/31/19 1654  09/01/19 0444  NA 142 138  K 4.2 3.8  CL 103 102  CO2 25 23  GLUCOSE 92 84  BUN 22 21  CREATININE 1.04* 0.94  CALCIUM 9.1 9.0   Liver Function Tests: Recent Labs  Lab 08/31/19 1654  AST 16  ALT 14  ALKPHOS 47  BILITOT 1.0  PROT 7.8  ALBUMIN 3.9   Recent Labs  Lab 08/31/19 1654  LIPASE 23   No results for input(s): AMMONIA in the last 168 hours. CBC: Recent Labs  Lab 08/31/19 1654 09/01/19 0444  WBC 9.9 10.8*  NEUTROABS 8.1*  --   HGB 13.8 14.1  HCT 45.0 46.2*  MCV 90.9 92.0  PLT 227 209   Cardiac Enzymes: No results for input(s): CKTOTAL, CKMB, CKMBINDEX, TROPONINI in the last 168 hours. BNP: BNP (last 3 results) No results for input(s): BNP in the last 8760 hours.  ProBNP (last 3 results) No results for input(s): PROBNP in the last 8760 hours.  CBG: No results for input(s): GLUCAP in the last 168 hours.     Signed:  Alma Friendly, MD [facility] 09/02/2019, 3:26 PM

## 2019-09-02 NOTE — TOC Transition Note (Signed)
Transition of Care [facility]) - CM/SW Discharge Note   Patient Details  Name: LAURETTE VILLESCAS MRN: [id number] Date of Birth: October 09, 1922  [facility]) CM/SW Contact:  Lynnell Catalan, RN Phone Number: 09/02/2019, 3:44 PM   Clinical Narrative:     This CM was informed by [facility] liaison that pt has a LTC bed available at [facility]. After [facility] had multiple issues with obtaining Passr this CM contacted [facility] to resolve Passr number issue. FL2, DC summary and Passr provided to [place], admissions coordinator. Abigail Butts phone # [phone]) fax [phone]). RN to call report an [facility] called for transport. Son alerted of transport this afternoon.

## 2019-09-16 DIAGNOSIS — R000 Tachycardia, unspecified: Secondary | ICD-10-CM | POA: Diagnosis not present

## 2019-09-16 DIAGNOSIS — F419 Anxiety disorder, unspecified: Secondary | ICD-10-CM | POA: Diagnosis not present

## 2019-09-16 DIAGNOSIS — R4182 Altered mental status, unspecified: Secondary | ICD-10-CM | POA: Diagnosis not present

## 2019-09-16 DIAGNOSIS — T68XXXA Hypothermia, initial encounter: Secondary | ICD-10-CM | POA: Diagnosis not present

## 2019-09-16 DIAGNOSIS — Z993 Dependence on wheelchair: Secondary | ICD-10-CM | POA: Diagnosis not present

## 2019-09-16 DIAGNOSIS — A4189 Other specified sepsis: Secondary | ICD-10-CM | POA: Diagnosis not present

## 2019-09-16 DIAGNOSIS — I10 Essential (primary) hypertension: Secondary | ICD-10-CM | POA: Diagnosis not present

## 2019-09-16 DIAGNOSIS — E785 Hyperlipidemia, unspecified: Secondary | ICD-10-CM | POA: Diagnosis not present

## 2019-09-16 DIAGNOSIS — R404 Transient alteration of awareness: Secondary | ICD-10-CM | POA: Diagnosis not present

## 2019-09-16 DIAGNOSIS — Z515 Encounter for palliative care: Secondary | ICD-10-CM | POA: Diagnosis not present

## 2019-09-16 DIAGNOSIS — I444 Left anterior fascicular block: Secondary | ICD-10-CM | POA: Diagnosis not present

## 2019-09-16 DIAGNOSIS — R680 Hypothermia, not associated with low environmental temperature: Secondary | ICD-10-CM | POA: Diagnosis not present

## 2019-09-16 DIAGNOSIS — I493 Ventricular premature depolarization: Secondary | ICD-10-CM | POA: Diagnosis not present

## 2019-09-16 DIAGNOSIS — R197 Diarrhea, unspecified: Secondary | ICD-10-CM | POA: Diagnosis not present

## 2019-09-16 DIAGNOSIS — I471 Supraventricular tachycardia: Secondary | ICD-10-CM | POA: Diagnosis not present

## 2019-09-16 DIAGNOSIS — J1282 Pneumonia due to coronavirus disease 2019: Secondary | ICD-10-CM | POA: Diagnosis not present

## 2019-09-16 DIAGNOSIS — E119 Type 2 diabetes mellitus without complications: Secondary | ICD-10-CM | POA: Diagnosis not present

## 2019-09-16 DIAGNOSIS — N178 Other acute kidney failure: Secondary | ICD-10-CM | POA: Diagnosis not present

## 2019-09-16 DIAGNOSIS — Z743 Need for continuous supervision: Secondary | ICD-10-CM | POA: Diagnosis not present

## 2019-09-16 DIAGNOSIS — A419 Sepsis, unspecified organism: Secondary | ICD-10-CM | POA: Diagnosis not present

## 2019-09-16 DIAGNOSIS — E870 Hyperosmolality and hypernatremia: Secondary | ICD-10-CM | POA: Diagnosis not present

## 2019-09-16 DIAGNOSIS — R6521 Severe sepsis with septic shock: Secondary | ICD-10-CM | POA: Diagnosis not present

## 2019-09-16 DIAGNOSIS — I452 Bifascicular block: Secondary | ICD-10-CM | POA: Diagnosis not present

## 2019-09-16 DIAGNOSIS — I480 Paroxysmal atrial fibrillation: Secondary | ICD-10-CM | POA: Diagnosis not present

## 2019-09-16 DIAGNOSIS — F418 Other specified anxiety disorders: Secondary | ICD-10-CM | POA: Diagnosis not present

## 2019-09-16 DIAGNOSIS — I4510 Unspecified right bundle-branch block: Secondary | ICD-10-CM | POA: Diagnosis not present

## 2019-09-16 DIAGNOSIS — I495 Sick sinus syndrome: Secondary | ICD-10-CM | POA: Diagnosis not present

## 2019-09-16 DIAGNOSIS — Z950 Presence of cardiac pacemaker: Secondary | ICD-10-CM | POA: Diagnosis not present

## 2019-09-16 DIAGNOSIS — I248 Other forms of acute ischemic heart disease: Secondary | ICD-10-CM | POA: Diagnosis not present

## 2019-09-16 DIAGNOSIS — R279 Unspecified lack of coordination: Secondary | ICD-10-CM | POA: Diagnosis not present

## 2019-09-16 DIAGNOSIS — E873 Alkalosis: Secondary | ICD-10-CM | POA: Diagnosis not present

## 2019-09-16 DIAGNOSIS — R0902 Hypoxemia: Secondary | ICD-10-CM | POA: Diagnosis not present

## 2019-09-16 DIAGNOSIS — Z66 Do not resuscitate: Secondary | ICD-10-CM | POA: Diagnosis not present

## 2019-09-16 DIAGNOSIS — E872 Acidosis: Secondary | ICD-10-CM | POA: Diagnosis not present

## 2019-09-16 DIAGNOSIS — I4891 Unspecified atrial fibrillation: Secondary | ICD-10-CM | POA: Diagnosis not present

## 2019-09-16 DIAGNOSIS — F0391 Unspecified dementia with behavioral disturbance: Secondary | ICD-10-CM | POA: Diagnosis not present

## 2019-09-16 DIAGNOSIS — R55 Syncope and collapse: Secondary | ICD-10-CM | POA: Diagnosis not present

## 2019-09-16 DIAGNOSIS — F329 Major depressive disorder, single episode, unspecified: Secondary | ICD-10-CM | POA: Diagnosis not present

## 2019-09-16 DIAGNOSIS — R4020 Unspecified coma: Secondary | ICD-10-CM | POA: Diagnosis not present

## 2019-09-16 DIAGNOSIS — J9601 Acute respiratory failure with hypoxia: Secondary | ICD-10-CM | POA: Diagnosis not present

## 2019-09-16 DIAGNOSIS — Z79899 Other long term (current) drug therapy: Secondary | ICD-10-CM | POA: Diagnosis not present

## 2019-09-16 DIAGNOSIS — U071 COVID-19: Secondary | ICD-10-CM | POA: Diagnosis not present

## 2019-09-17 DIAGNOSIS — I493 Ventricular premature depolarization: Secondary | ICD-10-CM | POA: Diagnosis not present

## 2019-09-17 DIAGNOSIS — I452 Bifascicular block: Secondary | ICD-10-CM | POA: Diagnosis not present

## 2019-09-17 DIAGNOSIS — I444 Left anterior fascicular block: Secondary | ICD-10-CM | POA: Diagnosis not present

## 2019-09-17 DIAGNOSIS — I4891 Unspecified atrial fibrillation: Secondary | ICD-10-CM | POA: Diagnosis not present

## 2019-09-17 DIAGNOSIS — I4510 Unspecified right bundle-branch block: Secondary | ICD-10-CM | POA: Diagnosis not present

## 2019-09-18 DIAGNOSIS — U071 COVID-19: Secondary | ICD-10-CM | POA: Diagnosis not present

## 2019-09-18 DIAGNOSIS — I4891 Unspecified atrial fibrillation: Secondary | ICD-10-CM | POA: Diagnosis not present

## 2019-09-18 DIAGNOSIS — T68XXXA Hypothermia, initial encounter: Secondary | ICD-10-CM | POA: Diagnosis not present

## 2019-09-18 DIAGNOSIS — R55 Syncope and collapse: Secondary | ICD-10-CM | POA: Diagnosis not present

## 2019-09-18 DIAGNOSIS — I471 Supraventricular tachycardia: Secondary | ICD-10-CM | POA: Diagnosis not present

## 2019-09-18 DIAGNOSIS — A419 Sepsis, unspecified organism: Secondary | ICD-10-CM | POA: Diagnosis not present

## 2019-09-18 DIAGNOSIS — I10 Essential (primary) hypertension: Secondary | ICD-10-CM | POA: Diagnosis not present

## 2019-09-18 DIAGNOSIS — I248 Other forms of acute ischemic heart disease: Secondary | ICD-10-CM | POA: Diagnosis not present

## 2019-09-18 DIAGNOSIS — R4182 Altered mental status, unspecified: Secondary | ICD-10-CM | POA: Diagnosis not present

## 2019-09-18 DIAGNOSIS — E872 Acidosis: Secondary | ICD-10-CM | POA: Diagnosis not present

## 2019-09-18 DIAGNOSIS — F418 Other specified anxiety disorders: Secondary | ICD-10-CM | POA: Diagnosis not present

## 2019-09-27 ENCOUNTER — Ambulatory Visit: Payer: PPO | Admitting: Neurology

## 2019-11-01 DEATH — deceased

## 2021-06-24 IMAGING — CT CT HEAD W/O CM
3 of 6 series · 16 of 47 positions shown, 19 images · non-contrast
Comparison: CT head dated August 24, 2018.

CLINICAL DATA: Altered mental status.

EXAM:
CT HEAD WITHOUT CONTRAST
TECHNIQUE: Contiguous axial images were obtained from the base of the skull
through the vertex without intravenous contrast.

[Series 2: head wo · axial · 0.47mm/px · z∈[-98,+32]mm · 11 of 32 slices shown, 14 images]
[im 3/32  brain]
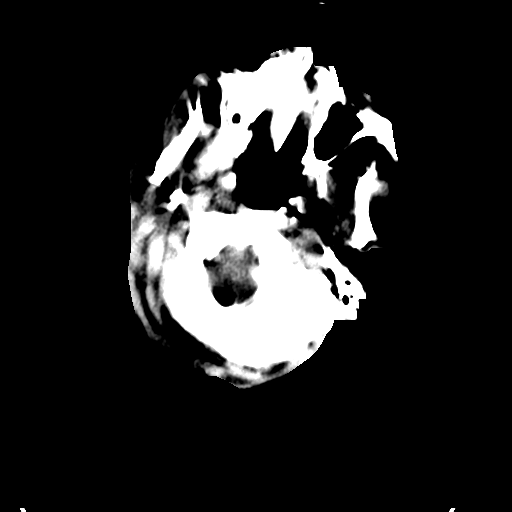
[im 3/32  bone]
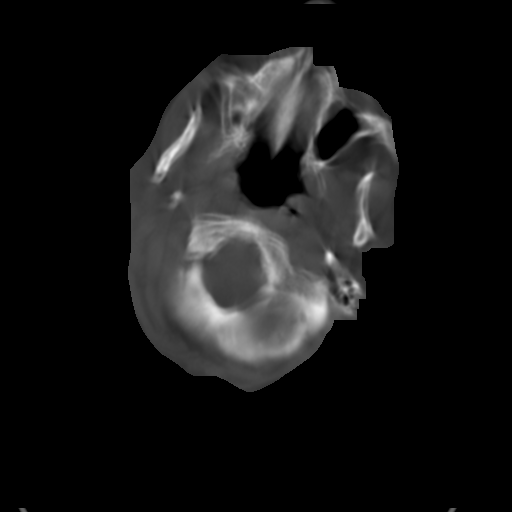
[im 5/32  brain]
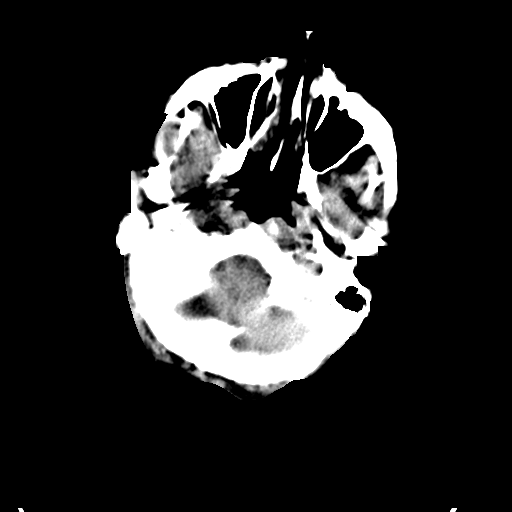
[im 7/32  brain]
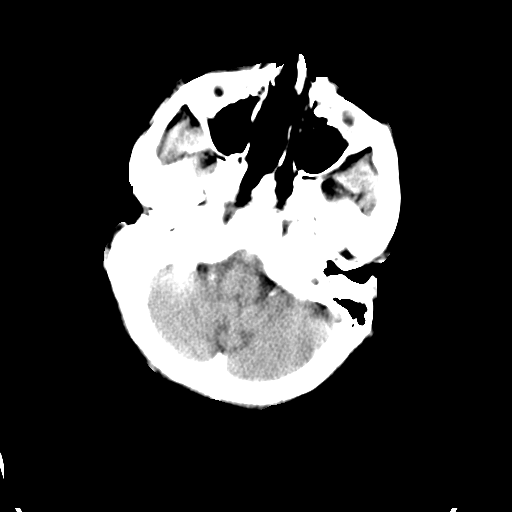
[im 12/32  brain]
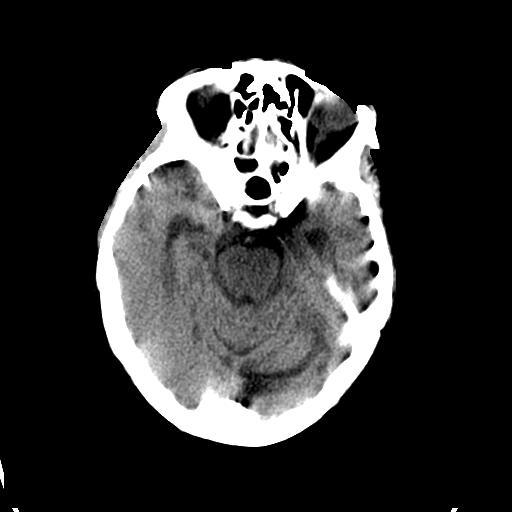
[im 14/32  brain]
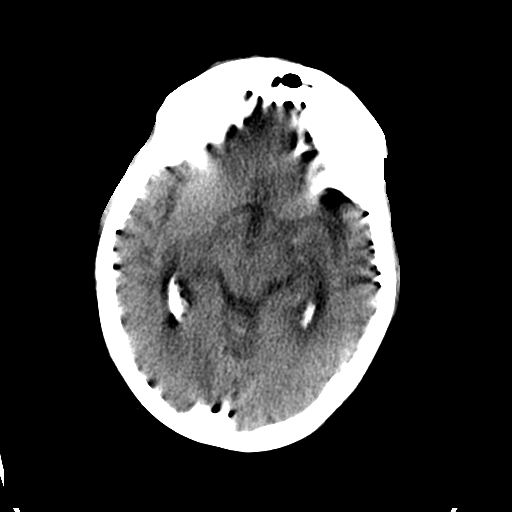
[im 14/32  bone]
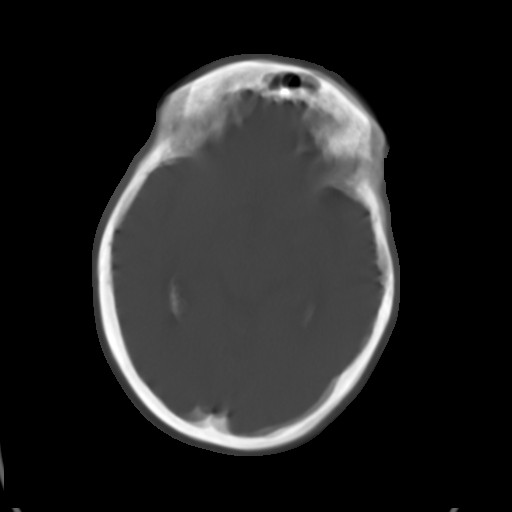
[im 16/32  brain]
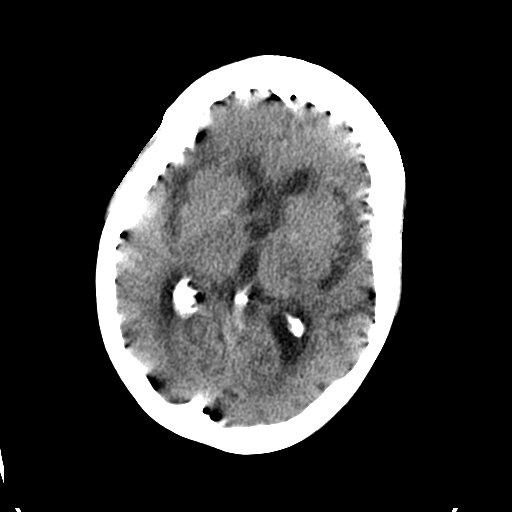
[im 18/32  brain]
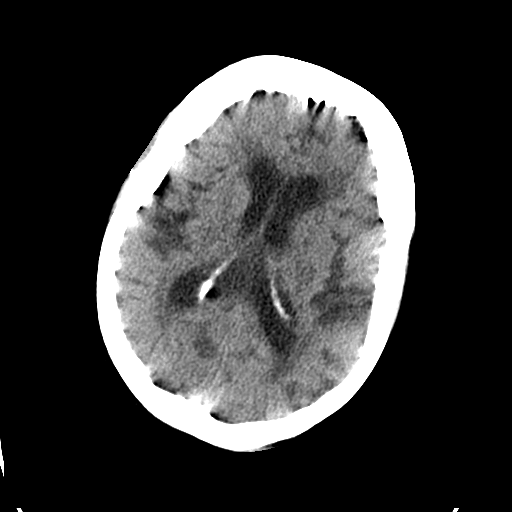
[im 20/32  brain]
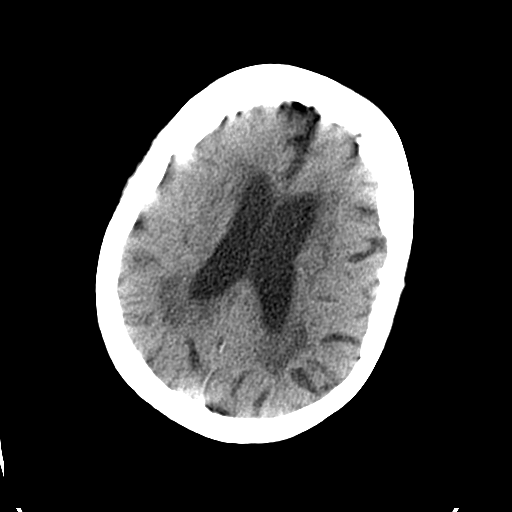
[im 25/32  brain]
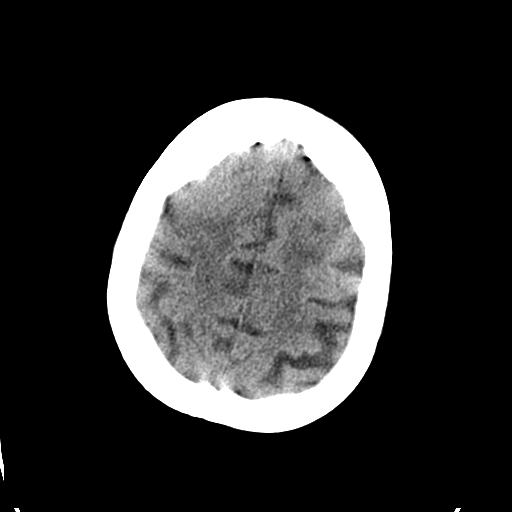
[im 25/32  bone]
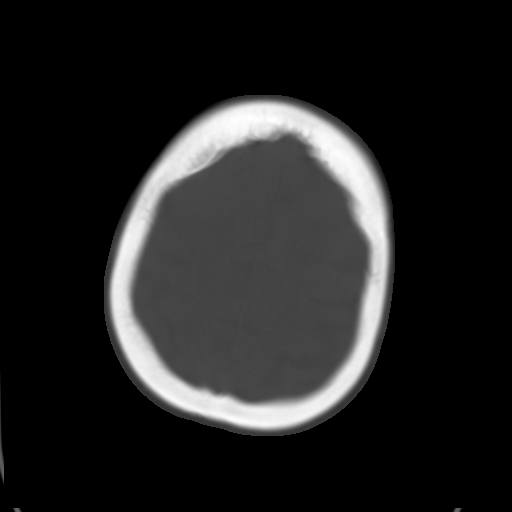
[im 27/32  brain]
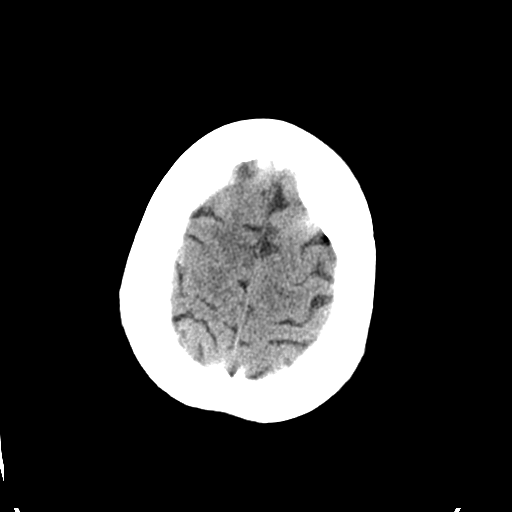
[im 29/32  brain]
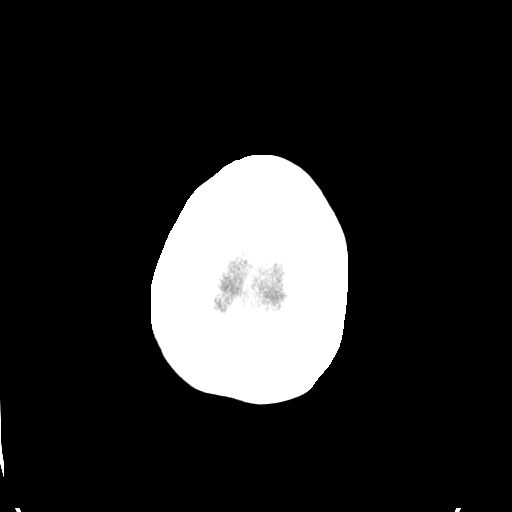

[Series 5: coronal soft tissue · coronal · 0.30mm/px · 3 of 67 slices shown]
[im 17/67  brain]
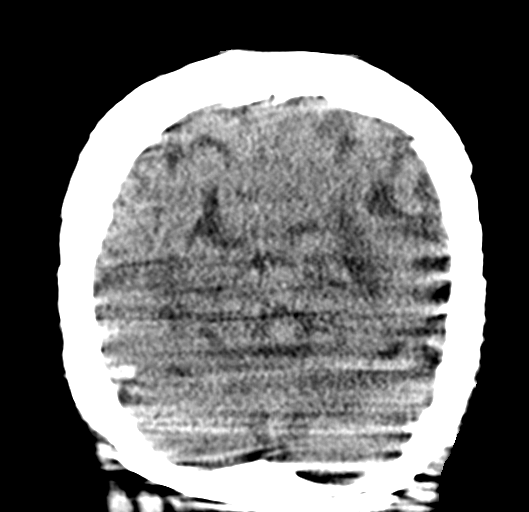
[im 34/67  brain]
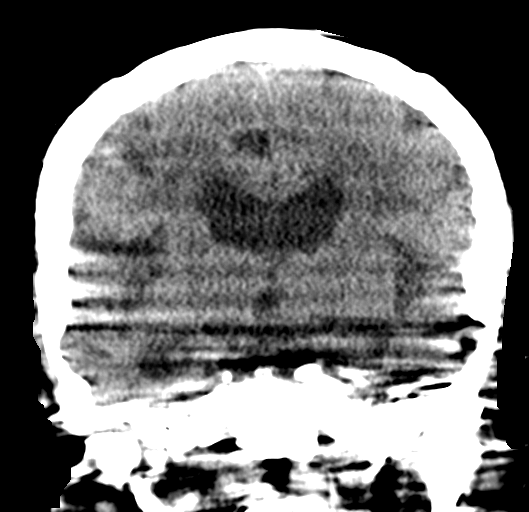
[im 50/67  brain]
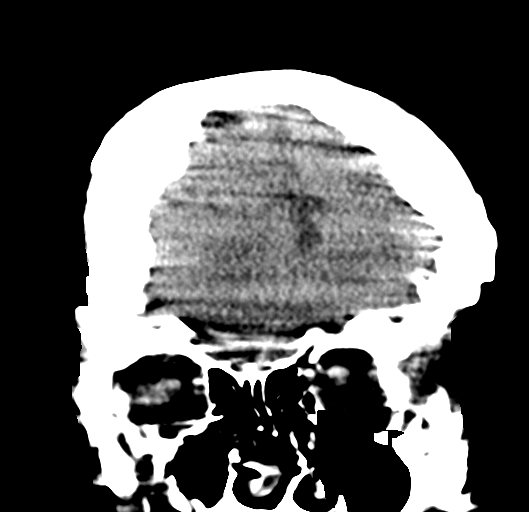

[Series 10: sagittal soft tissue · sagittal · 0.30mm/px · 2 of 56 slices shown]
[im 19/56  brain]
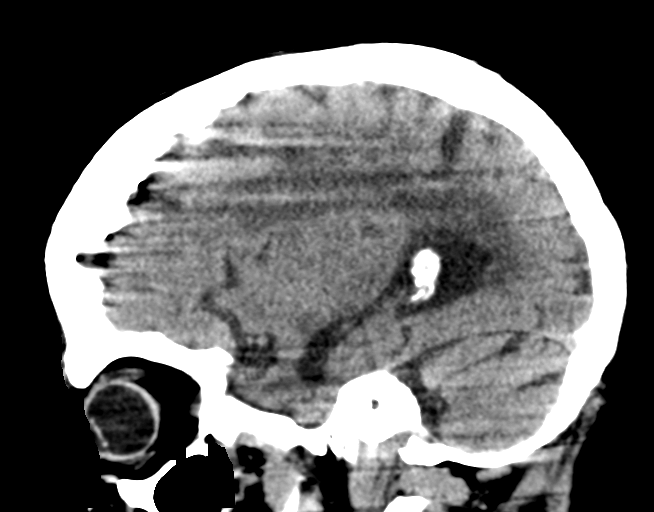
[im 37/56  brain]
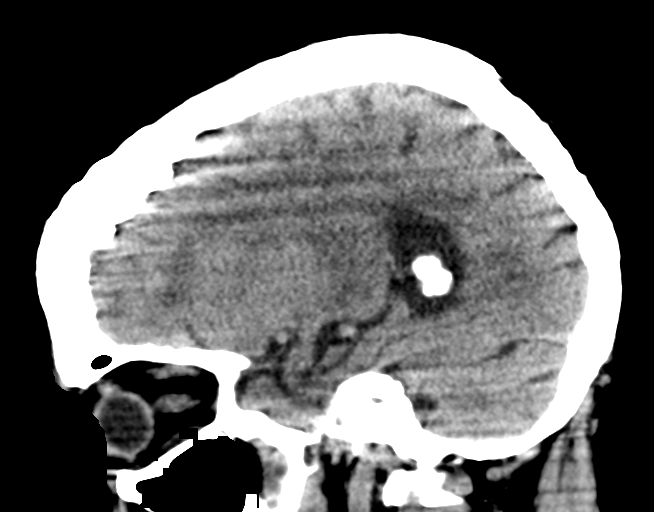

[16 of 47 positions shown; findings below may reference images not displayed]

FINDINGS: The study is limited by motion artifact.

Brain: No evidence of acute infarction, hemorrhage, hydrocephalus,
extra-axial collection or mass lesion/mass effect. Stable atrophy
and chronic microvascular ischemic changes. Old lacunar infarcts in
the left thalamus and basal ganglia again noted.

Vascular: Atherosclerotic vascular calcification of the carotid
siphons. No hyperdense vessel.

Skull: Negative for fracture or focal lesion.

Sinuses/Orbits: No acute finding.

Other: None.
IMPRESSION: 1. Motion limited study.  No acute intracranial abnormality.
2. Stable atrophy and chronic microvascular ischemic changes.
